# Patient Record
Sex: Female | Born: 1975 | Race: White | Hispanic: Yes | Marital: Single | Smoking: Never smoker
Health system: Southern US, Community
[De-identification: ages and names within clinical notes are randomized; demographics above are authoritative.]

## PROBLEM LIST (undated history)

## (undated) DIAGNOSIS — I639 Cerebral infarction, unspecified: Secondary | ICD-10-CM

## (undated) DIAGNOSIS — E119 Type 2 diabetes mellitus without complications: Secondary | ICD-10-CM

## (undated) DIAGNOSIS — I10 Essential (primary) hypertension: Secondary | ICD-10-CM

## (undated) HISTORY — DX: Cerebral infarction, unspecified: I63.9

---

## 2015-08-09 ENCOUNTER — Other Ambulatory Visit: Payer: Self-pay | Admitting: Podiatry

## 2015-08-09 ENCOUNTER — Ambulatory Visit (INDEPENDENT_AMBULATORY_CARE_PROVIDER_SITE_OTHER): Payer: Self-pay | Admitting: Podiatry

## 2015-08-09 ENCOUNTER — Ambulatory Visit: Payer: Self-pay

## 2015-08-09 ENCOUNTER — Ambulatory Visit (INDEPENDENT_AMBULATORY_CARE_PROVIDER_SITE_OTHER): Payer: Self-pay

## 2015-08-09 VITALS — BP 146/90 | HR 89 | Resp 16 | Ht 64.0 in | Wt 228.0 lb

## 2015-08-09 DIAGNOSIS — M722 Plantar fascial fibromatosis: Secondary | ICD-10-CM

## 2015-08-09 MED ORDER — DICLOFENAC SODIUM 75 MG PO TBEC: 75.0000 mg | DELAYED_RELEASE_TABLET | Freq: Two times a day (BID) | ORAL | Status: AC

## 2015-08-09 MED ORDER — TRIAMCINOLONE ACETONIDE 10 MG/ML IJ SUSP
10.0000 mg | Freq: Once | INTRAMUSCULAR | Status: AC
  Administered 2015-08-09: 10 mg

## 2015-08-09 NOTE — Progress Notes (Signed)
Subjective:     Patient ID: Aimee [name], female   DOB: 02/04/1976, 40 y.o.   MRN: [id number]  HPI patient states I'm having a lot of pain in my right heel for at least 6 months. Does not remember specific injury but states it's been very sore   Review of Systems     Objective:   Physical Exam Neurovascular status intact muscle strength adequate with exquisite discomfort plantar aspect right heel insertional point tendon calcaneus with moderate depression of the arch and no equinus condition noted. Patient is well oriented 3 with good digital perfusion    Assessment:     Acute plantar fasciitis right with inflammation    Plan:     H&P and x-rays reviewed with patient and today I injected the plantar fascia 3 mg Kenalog 5 mg Xylocaine and applied fascial brace with instructions on usage. Placed on diclofenac 75 mg twice a day and gave instructions on physical therapy and reappoint in 2 weeks

## 2015-08-09 NOTE — Patient Instructions (Signed)

## 2015-08-09 NOTE — Progress Notes (Signed)
   Subjective:    Patient ID: Aimee SaranMaria G [name], female    DOB: 02/11/1976, 40 y.o.   MRN: [id number]  HPI Patient presents with foot pain in their right foot, heel. Pstated, "hurts to turn foot and hurts more in the morning"; x6 months.   Review of Systems  HENT: Positive for sinus pressure.   Eyes: Positive for redness.  Musculoskeletal: Positive for gait problem.  Skin: Positive for color change.  All other systems reviewed and are negative.      Objective:   Physical Exam        Assessment & Plan:

## 2015-08-22 ENCOUNTER — Ambulatory Visit (INDEPENDENT_AMBULATORY_CARE_PROVIDER_SITE_OTHER): Payer: Self-pay | Admitting: Podiatry

## 2015-08-22 DIAGNOSIS — M722 Plantar fascial fibromatosis: Secondary | ICD-10-CM

## 2015-08-22 MED ORDER — TRIAMCINOLONE ACETONIDE 10 MG/ML IJ SUSP
10.0000 mg | Freq: Once | INTRAMUSCULAR | Status: AC
  Administered 2015-08-22: 10 mg

## 2015-08-24 NOTE — Progress Notes (Signed)
Subjective:     Patient ID: Aimee [name], female   DOB: 12-14-1975, 40 y.o.   MRN: [id number]  HPI patient states my heel is feeling quite a bit better but still sore at times.   Review of Systems     Objective:   Physical Exam Neurovascular status intact muscle strength adequate with continued discomfort plantar aspect right heel at the insertional point tendon into the calcaneus    Assessment:     Plantar fasciitis right with inflammation fluid buildup    Plan:     Reinjected plantar fascia 3 mg Kenalog 5 mg Xylocaine and instructed on the utilization of anti-inflammatories supportive shoe gear

## 2016-12-25 ENCOUNTER — Emergency Department (HOSPITAL_COMMUNITY): Payer: Medicaid Other

## 2016-12-25 ENCOUNTER — Encounter (HOSPITAL_COMMUNITY): Payer: Self-pay | Admitting: Emergency Medicine

## 2016-12-25 ENCOUNTER — Inpatient Hospital Stay (HOSPITAL_COMMUNITY): Payer: Medicaid Other

## 2016-12-25 ENCOUNTER — Inpatient Hospital Stay (HOSPITAL_COMMUNITY)
Admission: EM | Admit: 2016-12-25 | Discharge: 2017-01-07 | DRG: 020 | Disposition: A | Discharge status: Rehabilitation Facility | Payer: Medicaid Other | Attending: Neurosurgery | Admitting: Neurosurgery

## 2016-12-25 ENCOUNTER — Other Ambulatory Visit (HOSPITAL_COMMUNITY): Payer: Self-pay

## 2016-12-25 ENCOUNTER — Inpatient Hospital Stay (HOSPITAL_COMMUNITY): Payer: Medicaid Other | Admitting: Anesthesiology

## 2016-12-25 ENCOUNTER — Encounter (HOSPITAL_COMMUNITY)
Admission: EM | Disposition: A | Discharge status: Rehabilitation Facility | Payer: Self-pay | Source: Home / Self Care | Attending: Neurosurgery

## 2016-12-25 DIAGNOSIS — R402242 Coma scale, best verbal response, confused conversation, at arrival to emergency department: Secondary | ICD-10-CM | POA: Diagnosis present

## 2016-12-25 DIAGNOSIS — E876 Hypokalemia: Secondary | ICD-10-CM | POA: Diagnosis present

## 2016-12-25 DIAGNOSIS — J9690 Respiratory failure, unspecified, unspecified whether with hypoxia or hypercapnia: Secondary | ICD-10-CM

## 2016-12-25 DIAGNOSIS — E66811 Other obesity due to excess calories: Secondary | ICD-10-CM

## 2016-12-25 DIAGNOSIS — M542 Cervicalgia: Secondary | ICD-10-CM | POA: Diagnosis present

## 2016-12-25 DIAGNOSIS — Z9911 Dependence on respirator [ventilator] status: Secondary | ICD-10-CM | POA: Diagnosis not present

## 2016-12-25 DIAGNOSIS — E669 Obesity, unspecified: Secondary | ICD-10-CM | POA: Diagnosis present

## 2016-12-25 DIAGNOSIS — G8250 Quadriplegia, unspecified: Secondary | ICD-10-CM | POA: Diagnosis present

## 2016-12-25 DIAGNOSIS — R29718 NIHSS score 18: Secondary | ICD-10-CM | POA: Diagnosis present

## 2016-12-25 DIAGNOSIS — Z7984 Long term (current) use of oral hypoglycemic drugs: Secondary | ICD-10-CM | POA: Diagnosis not present

## 2016-12-25 DIAGNOSIS — T380X5A Adverse effect of glucocorticoids and synthetic analogues, initial encounter: Secondary | ICD-10-CM | POA: Diagnosis present

## 2016-12-25 DIAGNOSIS — R402113 Coma scale, eyes open, never, at hospital admission: Secondary | ICD-10-CM | POA: Diagnosis present

## 2016-12-25 DIAGNOSIS — R402132 Coma scale, eyes open, to sound, at arrival to emergency department: Secondary | ICD-10-CM | POA: Diagnosis present

## 2016-12-25 DIAGNOSIS — I6789 Other cerebrovascular disease: Secondary | ICD-10-CM | POA: Diagnosis not present

## 2016-12-25 DIAGNOSIS — T82528A Displacement of other cardiac and vascular devices and implants, initial encounter: Secondary | ICD-10-CM

## 2016-12-25 DIAGNOSIS — I6031 Nontraumatic subarachnoid hemorrhage from right posterior communicating artery: Principal | ICD-10-CM | POA: Diagnosis present

## 2016-12-25 DIAGNOSIS — R401 Stupor: Secondary | ICD-10-CM

## 2016-12-25 DIAGNOSIS — R402333 Coma scale, best motor response, abnormal, at hospital admission: Secondary | ICD-10-CM | POA: Diagnosis present

## 2016-12-25 DIAGNOSIS — R402342 Coma scale, best motor response, flexion withdrawal, at arrival to emergency department: Secondary | ICD-10-CM | POA: Diagnosis present

## 2016-12-25 DIAGNOSIS — Z2989 Encounter for other specified prophylactic measures: Secondary | ICD-10-CM

## 2016-12-25 DIAGNOSIS — R402213 Coma scale, best verbal response, none, at hospital admission: Secondary | ICD-10-CM | POA: Diagnosis present

## 2016-12-25 DIAGNOSIS — Z6833 Body mass index (BMI) 33.0-33.9, adult: Secondary | ICD-10-CM

## 2016-12-25 DIAGNOSIS — M6281 Muscle weakness (generalized): Secondary | ICD-10-CM | POA: Diagnosis present

## 2016-12-25 DIAGNOSIS — R0603 Acute respiratory distress: Secondary | ICD-10-CM | POA: Diagnosis not present

## 2016-12-25 DIAGNOSIS — D72829 Elevated white blood cell count, unspecified: Secondary | ICD-10-CM | POA: Diagnosis present

## 2016-12-25 DIAGNOSIS — I6340 Cerebral infarction due to embolism of unspecified cerebral artery: Secondary | ICD-10-CM | POA: Diagnosis present

## 2016-12-25 DIAGNOSIS — J9600 Acute respiratory failure, unspecified whether with hypoxia or hypercapnia: Secondary | ICD-10-CM | POA: Diagnosis not present

## 2016-12-25 DIAGNOSIS — G9340 Encephalopathy, unspecified: Secondary | ICD-10-CM

## 2016-12-25 DIAGNOSIS — Z9289 Personal history of other medical treatment: Secondary | ICD-10-CM

## 2016-12-25 DIAGNOSIS — R1310 Dysphagia, unspecified: Secondary | ICD-10-CM | POA: Diagnosis present

## 2016-12-25 DIAGNOSIS — I619 Nontraumatic intracerebral hemorrhage, unspecified: Secondary | ICD-10-CM | POA: Diagnosis present

## 2016-12-25 DIAGNOSIS — I671 Cerebral aneurysm, nonruptured: Secondary | ICD-10-CM | POA: Diagnosis present

## 2016-12-25 DIAGNOSIS — Z4659 Encounter for fitting and adjustment of other gastrointestinal appliance and device: Secondary | ICD-10-CM

## 2016-12-25 DIAGNOSIS — D649 Anemia, unspecified: Secondary | ICD-10-CM | POA: Diagnosis present

## 2016-12-25 DIAGNOSIS — R569 Unspecified convulsions: Secondary | ICD-10-CM | POA: Diagnosis present

## 2016-12-25 DIAGNOSIS — I10 Essential (primary) hypertension: Secondary | ICD-10-CM | POA: Diagnosis present

## 2016-12-25 DIAGNOSIS — J9601 Acute respiratory failure with hypoxia: Secondary | ICD-10-CM | POA: Diagnosis not present

## 2016-12-25 DIAGNOSIS — R000 Tachycardia, unspecified: Secondary | ICD-10-CM | POA: Diagnosis not present

## 2016-12-25 DIAGNOSIS — I609 Nontraumatic subarachnoid hemorrhage, unspecified: Secondary | ICD-10-CM

## 2016-12-25 DIAGNOSIS — R400 Somnolence: Secondary | ICD-10-CM | POA: Diagnosis not present

## 2016-12-25 DIAGNOSIS — I608 Other nontraumatic subarachnoid hemorrhage: Secondary | ICD-10-CM | POA: Diagnosis present

## 2016-12-25 DIAGNOSIS — R4182 Altered mental status, unspecified: Secondary | ICD-10-CM | POA: Diagnosis present

## 2016-12-25 DIAGNOSIS — E1165 Type 2 diabetes mellitus with hyperglycemia: Secondary | ICD-10-CM | POA: Diagnosis present

## 2016-12-25 DIAGNOSIS — R0602 Shortness of breath: Secondary | ICD-10-CM

## 2016-12-25 DIAGNOSIS — E1169 Type 2 diabetes mellitus with other specified complication: Secondary | ICD-10-CM

## 2016-12-25 DIAGNOSIS — Z298 Encounter for other specified prophylactic measures: Secondary | ICD-10-CM

## 2016-12-25 DIAGNOSIS — Z6830 Body mass index (BMI) 30.0-30.9, adult: Secondary | ICD-10-CM

## 2016-12-25 DIAGNOSIS — E6609 Other obesity due to excess calories: Secondary | ICD-10-CM

## 2016-12-25 HISTORY — PX: IR ANGIOGRAM FOLLOW UP STUDY: IMG697

## 2016-12-25 HISTORY — PX: IR ANGIO INTRA EXTRACRAN SEL INTERNAL CAROTID BILAT MOD SED: IMG5363

## 2016-12-25 HISTORY — DX: Type 2 diabetes mellitus without complications: E11.9

## 2016-12-25 HISTORY — PX: IR TRANSCATH/EMBOLIZ: IMG695

## 2016-12-25 HISTORY — DX: Essential (primary) hypertension: I10

## 2016-12-25 HISTORY — PX: RADIOLOGY WITH ANESTHESIA: SHX6223

## 2016-12-25 HISTORY — PX: IR ANGIO VERTEBRAL SEL VERTEBRAL UNI L MOD SED: IMG5367

## 2016-12-25 LAB — I-STAT ARTERIAL BLOOD GAS, ED
Acid-base deficit: 5 mmol/L — ABNORMAL HIGH (ref 0.0–2.0)
Bicarbonate: 20.1 mmol/L (ref 20.0–28.0)
O2 Saturation: 100 %
Patient temperature: 98.3
TCO2: 21 mmol/L (ref 0–100)
pCO2 arterial: 36.1 mmHg (ref 32.0–48.0)
pH, Arterial: 7.353 (ref 7.350–7.450)
pO2, Arterial: 501 mmHg — ABNORMAL HIGH (ref 83.0–108.0)

## 2016-12-25 LAB — CBC
HCT: 41 % (ref 36.0–46.0)
HCT: 39.9 % (ref 36.0–46.0)
Hemoglobin: 13.6 g/dL (ref 12.0–15.0)
Hemoglobin: 13.2 g/dL (ref 12.0–15.0)
MCH: 27.7 pg (ref 26.0–34.0)
MCH: 27.8 pg (ref 26.0–34.0)
MCHC: 33.2 g/dL (ref 30.0–36.0)
MCHC: 33.1 g/dL (ref 30.0–36.0)
MCV: 83.5 fL (ref 78.0–100.0)
MCV: 84 fL (ref 78.0–100.0)
Platelets: 339 10*3/uL (ref 150–400)
Platelets: 292 10*3/uL (ref 150–400)
RBC: 4.91 MIL/uL (ref 3.87–5.11)
RBC: 4.75 MIL/uL (ref 3.87–5.11)
RDW: 14.8 % (ref 11.5–15.5)
RDW: 15 % (ref 11.5–15.5)
WBC: 16.1 10*3/uL — ABNORMAL HIGH (ref 4.0–10.5)
WBC: 22.6 10*3/uL — ABNORMAL HIGH (ref 4.0–10.5)

## 2016-12-25 LAB — I-STAT CHEM 8, ED
BUN: 17 mg/dL (ref 6–20)
Calcium, Ion: 1.1 mmol/L — ABNORMAL LOW (ref 1.15–1.40)
Chloride: 105 mmol/L (ref 101–111)
Creatinine, Ser: 0.5 mg/dL (ref 0.44–1.00)
Glucose, Bld: 196 mg/dL — ABNORMAL HIGH (ref 65–99)
HCT: 43 % (ref 36.0–46.0)
Hemoglobin: 14.6 g/dL (ref 12.0–15.0)
Potassium: 2.9 mmol/L — ABNORMAL LOW (ref 3.5–5.1)
Sodium: 138 mmol/L (ref 135–145)
TCO2: 19 mmol/L (ref 0–100)

## 2016-12-25 LAB — COMPREHENSIVE METABOLIC PANEL WITH GFR
ALT: 15 U/L (ref 14–54)
AST: 20 U/L (ref 15–41)
Albumin: 3.8 g/dL (ref 3.5–5.0)
Alkaline Phosphatase: 70 U/L (ref 38–126)
Anion gap: 12 (ref 5–15)
BUN: 15 mg/dL (ref 6–20)
CO2: 18 mmol/L — ABNORMAL LOW (ref 22–32)
Calcium: 8.9 mg/dL (ref 8.9–10.3)
Chloride: 104 mmol/L (ref 101–111)
Creatinine, Ser: 0.7 mg/dL (ref 0.44–1.00)
GFR calc Af Amer: >60 mL/min (ref >60)
GFR calc non Af Amer: >60 mL/min (ref >60)
Glucose, Bld: 193 mg/dL — ABNORMAL HIGH (ref 65–99)
Potassium: 2.9 mmol/L — ABNORMAL LOW (ref 3.5–5.1)
Sodium: 134 mmol/L — ABNORMAL LOW (ref 135–145)
Total Bilirubin: 0.6 mg/dL (ref 0.3–1.2)
Total Protein: 7.2 g/dL (ref 6.5–8.1)

## 2016-12-25 LAB — BASIC METABOLIC PANEL WITH GFR
Anion gap: 10 (ref 5–15)
BUN: 11 mg/dL (ref 6–20)
CO2: 18 mmol/L — ABNORMAL LOW (ref 22–32)
Calcium: 8.3 mg/dL — ABNORMAL LOW (ref 8.9–10.3)
Chloride: 106 mmol/L (ref 101–111)
Creatinine, Ser: 0.66 mg/dL (ref 0.44–1.00)
GFR calc Af Amer: >60 mL/min (ref >60)
GFR calc non Af Amer: >60 mL/min (ref >60)
Glucose, Bld: 189 mg/dL — ABNORMAL HIGH (ref 65–99)
Potassium: 4.2 mmol/L (ref 3.5–5.1)
Sodium: 134 mmol/L — ABNORMAL LOW (ref 135–145)

## 2016-12-25 LAB — GLUCOSE, CAPILLARY
Glucose-Capillary: 194 mg/dL — ABNORMAL HIGH (ref 65–99)
Glucose-Capillary: 178 mg/dL — ABNORMAL HIGH (ref 65–99)
Glucose-Capillary: 185 mg/dL — ABNORMAL HIGH (ref 65–99)
Glucose-Capillary: 178 mg/dL — ABNORMAL HIGH (ref 65–99)
Glucose-Capillary: 159 mg/dL — ABNORMAL HIGH (ref 65–99)

## 2016-12-25 LAB — PROTIME-INR
INR: 1.04
INR: 1.08
Prothrombin Time: 13.7 s (ref 11.4–15.2)
Prothrombin Time: 14 s (ref 11.4–15.2)

## 2016-12-25 LAB — RAPID URINE DRUG SCREEN, HOSP PERFORMED
Amphetamines: NOT DETECTED
Barbiturates: NOT DETECTED
Benzodiazepines: POSITIVE — AB
Cocaine: NOT DETECTED
Opiates: NOT DETECTED
Tetrahydrocannabinol: NOT DETECTED

## 2016-12-25 LAB — DIFFERENTIAL
Basophils Absolute: 0 10*3/uL (ref 0.0–0.1)
Basophils Relative: 0 %
Eosinophils Absolute: 0.3 10*3/uL (ref 0.0–0.7)
Eosinophils Relative: 2 %
Lymphocytes Relative: 25 %
Lymphs Abs: 4 10*3/uL (ref 0.7–4.0)
Monocytes Absolute: 0.8 10*3/uL (ref 0.1–1.0)
Monocytes Relative: 5 %
Neutro Abs: 11 10*3/uL — ABNORMAL HIGH (ref 1.7–7.7)
Neutrophils Relative %: 68 %

## 2016-12-25 LAB — CBG MONITORING, ED: Glucose-Capillary: 203 mg/dL — ABNORMAL HIGH (ref 65–99)

## 2016-12-25 LAB — MRSA PCR SCREENING: MRSA by PCR: NEGATIVE

## 2016-12-25 LAB — APTT
aPTT: 30 s (ref 24–36)
aPTT: 32 s (ref 24–36)

## 2016-12-25 LAB — ABO/RH: ABO/RH(D): O POS

## 2016-12-25 LAB — HIV ANTIBODY (ROUTINE TESTING W REFLEX): HIV Screen 4th Generation wRfx: NONREACTIVE

## 2016-12-25 LAB — ECHOCARDIOGRAM COMPLETE
Height: 64 in
Weight: 3350.99 [oz_av]

## 2016-12-25 LAB — TRIGLYCERIDES: Triglycerides: 322 mg/dL — ABNORMAL HIGH (ref <150)

## 2016-12-25 LAB — I-STAT TROPONIN, ED: Troponin i, poc: 0 ng/mL (ref 0.00–0.08)

## 2016-12-25 LAB — PREPARE RBC (CROSSMATCH)

## 2016-12-25 SURGERY — RADIOLOGY WITH ANESTHESIA
Anesthesia: Monitor Anesthesia Care

## 2016-12-25 MED ORDER — FENTANYL BOLUS VIA INFUSION
50.0000 ug | INTRAVENOUS | Status: DC | PRN
Start: 2016-12-25 — End: 2017-01-02
  Administered 2016-12-28: 50 ug via INTRAVENOUS
  Filled 2016-12-25: qty 50

## 2016-12-25 MED ORDER — ONDANSETRON HCL 4 MG/2ML IJ SOLN
4.0000 mg | Freq: Four times a day (QID) | INTRAMUSCULAR | Status: DC | PRN
Start: 2016-12-25 — End: 2017-01-07
  Administered 2017-01-01 (×2): 4 mg via INTRAVENOUS
  Filled 2016-12-25 (×2): qty 2

## 2016-12-25 MED ORDER — NIMODIPINE 30 MG PO CAPS: 60.0000 mg | ORAL_CAPSULE | ORAL | Status: DC

## 2016-12-25 MED ORDER — PROPOFOL 1000 MG/100ML IV EMUL
0.0000 ug/kg/min | INTRAVENOUS | Status: DC
  Administered 2016-12-26 – 2016-12-27 (×4): 30 ug/kg/min via INTRAVENOUS
  Administered 2016-12-27: 20 ug/kg/min via INTRAVENOUS
  Administered 2016-12-28 (×4): 30 ug/kg/min via INTRAVENOUS
  Administered 2016-12-29 – 2016-12-30 (×7): 50 ug/kg/min via INTRAVENOUS
  Administered 2016-12-30: 40 ug/kg/min via INTRAVENOUS
  Administered 2016-12-30: 25 ug/kg/min via INTRAVENOUS
  Administered 2016-12-31: 30 ug/kg/min via INTRAVENOUS
  Administered 2016-12-31: 40 ug/kg/min via INTRAVENOUS
  Filled 2016-12-25 (×28): qty 100

## 2016-12-25 MED ORDER — MIDAZOLAM HCL 5 MG/5ML IJ SOLN
INTRAMUSCULAR | Status: DC | PRN
  Administered 2016-12-25 (×2): 2 mg via INTRAVENOUS

## 2016-12-25 MED ORDER — ACETAMINOPHEN 160 MG/5ML PO SOLN
650.0000 mg | ORAL | Status: DC | PRN
  Administered 2016-12-27 – 2017-01-01 (×7): 650 mg
  Filled 2016-12-25 (×7): qty 20.3

## 2016-12-25 MED ORDER — PERFLUTREN LIPID MICROSPHERE
INTRAVENOUS | Status: AC
  Administered 2016-12-25: 2 mL
  Filled 2016-12-25: qty 10

## 2016-12-25 MED ORDER — SODIUM CHLORIDE 0.9 % IV SOLN: 250.0000 mL | INTRAVENOUS | Status: DC | PRN

## 2016-12-25 MED ORDER — ROCURONIUM BROMIDE 100 MG/10ML IV SOLN
INTRAVENOUS | Status: DC | PRN
  Administered 2016-12-25 (×2): 50 mg via INTRAVENOUS

## 2016-12-25 MED ORDER — SODIUM CHLORIDE 0.9 % IV SOLN: Freq: Once | INTRAVENOUS | Status: DC

## 2016-12-25 MED ORDER — CHLORHEXIDINE GLUCONATE 0.12% ORAL RINSE (MEDLINE KIT)
15.0000 mL | Freq: Two times a day (BID) | OROMUCOSAL | Status: DC
  Administered 2016-12-25 – 2017-01-07 (×25): 15 mL via OROMUCOSAL

## 2016-12-25 MED ORDER — FENTANYL 2500MCG IN NS 250ML (10MCG/ML) PREMIX INFUSION
25.0000 ug/h | INTRAVENOUS | Status: DC
  Administered 2016-12-25 – 2016-12-26 (×2): 50 ug/h via INTRAVENOUS
  Administered 2016-12-26: 75 ug/h via INTRAVENOUS
  Administered 2016-12-27: 125 ug/h via INTRAVENOUS
  Administered 2016-12-27 – 2016-12-28 (×2): 100 ug/h via INTRAVENOUS
  Administered 2016-12-29: 350 ug/h via INTRAVENOUS
  Administered 2016-12-29: 400 ug/h via INTRAVENOUS
  Administered 2016-12-30: 300 ug/h via INTRAVENOUS
  Administered 2016-12-30: 400 ug/h via INTRAVENOUS
  Administered 2016-12-30: 300 ug/h via INTRAVENOUS
  Filled 2016-12-25 (×12): qty 250

## 2016-12-25 MED ORDER — ONDANSETRON 4 MG PO TBDP: 4.0000 mg | ORAL_TABLET | Freq: Four times a day (QID) | ORAL | Status: DC | PRN

## 2016-12-25 MED ORDER — INSULIN ASPART 100 UNIT/ML ~~LOC~~ SOLN
0.0000 [IU] | Freq: Three times a day (TID) | SUBCUTANEOUS | Status: DC
  Administered 2016-12-25 – 2016-12-26 (×5): 4 [IU] via SUBCUTANEOUS
  Administered 2016-12-26: 7 [IU] via SUBCUTANEOUS
  Administered 2016-12-27 (×3): 4 [IU] via SUBCUTANEOUS

## 2016-12-25 MED ORDER — PROPOFOL 1000 MG/100ML IV EMUL
INTRAVENOUS | Status: AC
  Administered 2016-12-25: 10 ug/kg/min via INTRAVENOUS
  Filled 2016-12-25: qty 100

## 2016-12-25 MED ORDER — FENTANYL CITRATE (PF) 250 MCG/5ML IJ SOLN
INTRAMUSCULAR | Status: DC | PRN
  Administered 2016-12-25 (×2): 50 ug via INTRAVENOUS

## 2016-12-25 MED ORDER — PROMETHAZINE HCL 25 MG/ML IJ SOLN
12.5000 mg | Freq: Four times a day (QID) | INTRAMUSCULAR | Status: DC | PRN
  Administered 2016-12-25: 12.5 mg via INTRAVENOUS
  Filled 2016-12-25: qty 1

## 2016-12-25 MED ORDER — POTASSIUM CHLORIDE 20 MEQ/15ML (10%) PO SOLN
40.0000 meq | ORAL | Status: AC
  Administered 2016-12-25: 40 meq
  Filled 2016-12-25: qty 30

## 2016-12-25 MED ORDER — ACETAMINOPHEN 650 MG RE SUPP
650.0000 mg | RECTAL | Status: DC | PRN
  Administered 2016-12-27: 650 mg via RECTAL
  Filled 2016-12-25: qty 1

## 2016-12-25 MED ORDER — PROPOFOL 1000 MG/100ML IV EMUL
5.0000 ug/kg/min | Freq: Once | INTRAVENOUS | Status: DC
  Administered 2016-12-25: 10 ug/kg/min via INTRAVENOUS

## 2016-12-25 MED ORDER — DEXAMETHASONE SODIUM PHOSPHATE 10 MG/ML IJ SOLN
10.0000 mg | Freq: Four times a day (QID) | INTRAMUSCULAR | Status: DC
  Administered 2016-12-25 – 2017-01-07 (×55): 10 mg via INTRAVENOUS
  Filled 2016-12-25 (×55): qty 1

## 2016-12-25 MED ORDER — TRAMADOL HCL 50 MG PO TABS
50.0000 mg | ORAL_TABLET | Freq: Four times a day (QID) | ORAL | Status: DC | PRN
  Administered 2017-01-01 – 2017-01-06 (×4): 100 mg via ORAL
  Filled 2016-12-25 (×4): qty 2

## 2016-12-25 MED ORDER — MIDAZOLAM HCL 2 MG/2ML IJ SOLN
INTRAMUSCULAR | Status: AC
  Filled 2016-12-25: qty 2

## 2016-12-25 MED ORDER — ASPIRIN 81 MG PO CHEW: 324.0000 mg | CHEWABLE_TABLET | ORAL | Status: AC

## 2016-12-25 MED ORDER — ETOMIDATE 2 MG/ML IV SOLN
INTRAVENOUS | Status: DC | PRN
  Administered 2016-12-25: 20 mg via INTRAVENOUS

## 2016-12-25 MED ORDER — PERFLUTREN LIPID MICROSPHERE
1.0000 mL | INTRAVENOUS | Status: AC | PRN
  Filled 2016-12-25: qty 10

## 2016-12-25 MED ORDER — IOPAMIDOL (ISOVUE-300) INJECTION 61%
INTRAVENOUS | Status: AC
  Filled 2016-12-25: qty 150

## 2016-12-25 MED ORDER — PANTOPRAZOLE SODIUM 40 MG PO TBEC: 40.0000 mg | DELAYED_RELEASE_TABLET | Freq: Every day | ORAL | Status: DC

## 2016-12-25 MED ORDER — IOPAMIDOL (ISOVUE-370) INJECTION 76%
INTRAVENOUS | Status: AC
  Administered 2016-12-25: 50 mL
  Filled 2016-12-25: qty 50

## 2016-12-25 MED ORDER — NIMODIPINE 60 MG/20ML PO SOLN
60.0000 mg | ORAL | Status: DC
  Administered 2016-12-25 – 2017-01-07 (×74): 60 mg
  Filled 2016-12-25 (×80): qty 20

## 2016-12-25 MED ORDER — NIMODIPINE 60 MG/20ML PO SOLN: 60.0000 mg | ORAL | Status: DC

## 2016-12-25 MED ORDER — STROKE: EARLY STAGES OF RECOVERY BOOK
Freq: Once | Status: AC
  Administered 2016-12-25: 15:00:00
  Filled 2016-12-25: qty 1

## 2016-12-25 MED ORDER — SODIUM CHLORIDE 0.9 % IV SOLN
INTRAVENOUS | Status: DC | PRN
  Administered 2016-12-25: 20:00:00 via INTRAVENOUS

## 2016-12-25 MED ORDER — SODIUM CHLORIDE 0.9 % IV SOLN
INTRAVENOUS | Status: DC
  Administered 2016-12-25 – 2017-01-02 (×8): via INTRAVENOUS
  Administered 2017-01-03: 75 mL/h via INTRAVENOUS
  Administered 2017-01-03 – 2017-01-06 (×5): via INTRAVENOUS

## 2016-12-25 MED ORDER — SODIUM CHLORIDE 0.9 % IV SOLN
1000.0000 mg | Freq: Once | INTRAVENOUS | Status: AC
  Administered 2016-12-25: 1000 mg via INTRAVENOUS
  Filled 2016-12-25: qty 10

## 2016-12-25 MED ORDER — NICARDIPINE HCL IN NACL 20-0.86 MG/200ML-% IV SOLN: 3.0000 mg/h | Freq: Once | INTRAVENOUS | Status: DC

## 2016-12-25 MED ORDER — PANTOPRAZOLE SODIUM 40 MG IV SOLR
40.0000 mg | INTRAVENOUS | Status: DC
  Administered 2016-12-25 – 2017-01-06 (×13): 40 mg via INTRAVENOUS
  Filled 2016-12-25 (×12): qty 40

## 2016-12-25 MED ORDER — FENTANYL CITRATE (PF) 100 MCG/2ML IJ SOLN
50.0000 ug | Freq: Once | INTRAMUSCULAR | Status: AC
  Administered 2016-12-25: 50 ug via INTRAVENOUS

## 2016-12-25 MED ORDER — POTASSIUM CHLORIDE CRYS ER 20 MEQ PO TBCR: 40.0000 meq | EXTENDED_RELEASE_TABLET | ORAL | Status: DC

## 2016-12-25 MED ORDER — PANTOPRAZOLE SODIUM 40 MG PO PACK: 40.0000 mg | PACK | Freq: Every day | ORAL | Status: DC

## 2016-12-25 MED ORDER — NIMODIPINE 30 MG PO CAPS
60.0000 mg | ORAL_CAPSULE | ORAL | Status: DC
  Administered 2017-01-05 – 2017-01-07 (×4): 60 mg via ORAL
  Filled 2016-12-25 (×18): qty 2

## 2016-12-25 MED ORDER — ACETAMINOPHEN 325 MG PO TABS: 650.0000 mg | ORAL_TABLET | ORAL | Status: DC | PRN

## 2016-12-25 MED ORDER — SUCCINYLCHOLINE CHLORIDE 20 MG/ML IJ SOLN
INTRAMUSCULAR | Status: DC | PRN
  Administered 2016-12-25: 150 mg via INTRAVENOUS

## 2016-12-25 MED ORDER — PANTOPRAZOLE SODIUM 40 MG IV SOLR: 40.0000 mg | Freq: Every day | INTRAVENOUS | Status: DC

## 2016-12-25 MED ORDER — ASPIRIN 300 MG RE SUPP: 300.0000 mg | RECTAL | Status: AC

## 2016-12-25 MED ORDER — INSULIN ASPART 100 UNIT/ML ~~LOC~~ SOLN: 2.0000 [IU] | SUBCUTANEOUS | Status: DC

## 2016-12-25 MED ORDER — LEVETIRACETAM 500 MG/5ML IV SOLN
500.0000 mg | Freq: Two times a day (BID) | INTRAVENOUS | Status: DC
  Administered 2016-12-25 – 2017-01-07 (×27): 500 mg via INTRAVENOUS
  Filled 2016-12-25 (×29): qty 5

## 2016-12-25 MED ORDER — DOCUSATE SODIUM 100 MG PO CAPS: 100.0000 mg | ORAL_CAPSULE | Freq: Two times a day (BID) | ORAL | Status: DC

## 2016-12-25 MED ORDER — NICARDIPINE HCL IN NACL 20-0.86 MG/200ML-% IV SOLN
0.0000 mg/h | INTRAVENOUS | Status: DC
  Administered 2016-12-25: 5 mg/h via INTRAVENOUS
  Administered 2016-12-26: 12 mg/h via INTRAVENOUS
  Administered 2016-12-26 (×2): 15 mg/h via INTRAVENOUS
  Administered 2016-12-26: 8 mg/h via INTRAVENOUS
  Administered 2016-12-26: 15 mg/h via INTRAVENOUS
  Filled 2016-12-25 (×6): qty 200

## 2016-12-25 MED ORDER — DOCUSATE SODIUM 50 MG/5ML PO LIQD
100.0000 mg | Freq: Two times a day (BID) | ORAL | Status: DC
  Administered 2016-12-25 – 2016-12-31 (×8): 100 mg
  Filled 2016-12-25 (×10): qty 10

## 2016-12-25 MED ORDER — ORAL CARE MOUTH RINSE
15.0000 mL | OROMUCOSAL | Status: DC
  Administered 2016-12-25 – 2016-12-31 (×54): 15 mL via OROMUCOSAL

## 2016-12-25 NOTE — Anesthesia Preprocedure Evaluation (Addendum)
Anesthesia Evaluation  Patient identified by MRN, date of birth, ID band Patient awake    Reviewed: Allergy & Precautions, NPO status , Patient's Chart, lab work & pertinent test resultsPreop documentation limited or incomplete due to emergent nature of procedure.  Airway Mallampati: Intubated       Dental   Pulmonary neg pulmonary ROS,    breath sounds clear to auscultation       Cardiovascular hypertension,  Rhythm:Regular Rate:Tachycardia     Neuro/Psych SAH    GI/Hepatic negative GI ROS, Neg liver ROS,   Endo/Other  diabetes, Type 2, Oral Hypoglycemic Agents  Renal/GU negative Renal ROS     Musculoskeletal   Abdominal   Peds  Hematology negative hematology ROS (+)   Anesthesia Other Findings   Reproductive/Obstetrics                          Lab Results  Component Value Date   WBC 22.6 (H) 12/25/2016   HGB 13.2 12/25/2016   HCT 39.9 12/25/2016   MCV 84.0 12/25/2016   PLT 292 12/25/2016   Lab Results  Component Value Date   CREATININE 0.50 12/25/2016   BUN 17 12/25/2016   NA 138 12/25/2016   K 2.9 (L) 12/25/2016   CL 105 12/25/2016   CO2 18 (L) 12/25/2016    Anesthesia Physical Anesthesia Plan  ASA: IV and emergent  Anesthesia Plan: General   Post-op Pain Management:    Induction: Intravenous  Airway Management Planned: Oral ETT  Additional Equipment: Arterial line and CVP  Intra-op Plan:   Post-operative Plan: Post-operative intubation/ventilation  Informed Consent: I have reviewed the patients History and Physical, chart, labs and discussed the procedure including the risks, benefits and alternatives for the proposed anesthesia with the patient or authorized representative who has indicated his/her understanding and acceptance.   Only emergency history available and History available from chart only  Plan Discussed with: CRNA, Anesthesiologist and  Surgeon  Anesthesia Plan Comments:      Anesthesia Quick Evaluation

## 2016-12-25 NOTE — H&P (Signed)
Subjective: The patient is a 41 year old obese Hispanic female immigrant from [place] with a history of diabetes and hypertension. According to her brother who lives with the patient, the patient was found to be vomiting and unresponsive early this morning. He describes decerebrate posturing. EMS was called and the patient was brought to [facility] emerged department. By report the patient was posturing and not moving her extremities. The patient was sedated and intubated. A head CT was obtained which demonstrated a diffuse subarachnoid hemorrhage. This was followed by CT angiogram which demonstrates a right posterior communicating artery segment aneurysm. A neurosurgical consultation was requested.  The patient's history is obtained by the patient's brother, [name], who was at the bedside. She is not married and has no children.  Past Medical History:  Diagnosis Date  . Diabetes mellitus without complication ([place])   . Hypertension     History reviewed. No pertinent surgical history.  No Known Allergies  Social History  Substance Use Topics  . Smoking status: Not on file  . Smokeless tobacco: Not on file  . Alcohol use No    No family history on file. Prior to Admission medications   Medication Sig Start Date End Date Taking? Authorizing Provider  metFORMIN (GLUCOPHAGE) 500 MG tablet Take 500 mg by mouth daily with breakfast.   Yes [provider]     Review of Systems  Positive ROS: Unobtainable    Objective: Vital signs in last 24 hours: Temp:  [98.3 F (36.8 C)] 98.3 F (36.8 C) (05/22 0648) Pulse Rate:  [65-127] 95 (05/22 0657) Resp:  [14-29] 18 (05/22 0657) BP: (109-212)/(86-113) 139/95 (05/22 0657) SpO2:  [99 %-100 %] 100 % (05/22 0657) FiO2 (%):  [100 %] 100 % (05/22 0555) Weight:  [81.6 kg (180 lb)] 81.6 kg (180 lb) (05/22 0558)  Physical exam:  General: An obese intubated 41 year old Hispanic female  HEENT: Normocephalic, atraumatic. Her pupils are  approximately 2 mm bilaterally.  Neck: Unremarkable  Thorax: Symmetric  Abdomen: Obese  Extremities: Unremarkable  Neurologic exam: Glasgow Coma Scale 5 intubated, E1M3V1. She will weakly flex her left upper extremity and extends her right upper extremity to painful stimuli. Cranial nerve exam is limited. She has a cough and gag reflex. She has corneal reflexes bilaterally. I can't get her to move her lower extremities.  I have reviewed the patient's head CT performed at [facility] today. It demonstrates a diffuse subarachnoid hemorrhage. She doesn't have any significant hydrocephalus. She has quite a hyperostotic skull.  By report the patient's CT angiogram demonstrates a left posterior communicating artery segment aneurysm.   Data Review Lab Results  Component Value Date   WBC 16.1 (H) 12/25/2016   HGB 14.6 12/25/2016   HCT 43.0 12/25/2016   MCV 83.5 12/25/2016   PLT 339 12/25/2016   Lab Results  Component Value Date   NA 138 12/25/2016   K 2.9 (L) 12/25/2016   CL 105 12/25/2016   CO2 18 (L) 12/25/2016   BUN 17 12/25/2016   CREATININE 0.50 12/25/2016   GLUCOSE 196 (H) 12/25/2016   Lab Results  Component Value Date   INR 1.04 12/25/2016    Assessment/Plan: Grade 5 subarachnoid hemorrhage, right posterior communicating artery segment aneurysm: I have discussed the situation with the patient's brother. We have discussed the various treatment options. I have recommended coiling of the aneurysm to hopefully prevent future rupture. I briefly explained the procedure to the patient's brother. He wants to proceed with procedure. I have spoken  to Dr. Kathyrn Sheriff will plan to do this later today. In the meantime the patient is being admitted to the ICU. I have spoken with Dr. Elsworth Soho, critical care medicine.   Tarrell Debes D 12/25/2016 7:13 AM

## 2016-12-25 NOTE — ED Notes (Signed)
Pt starting to posture more. MD notified

## 2016-12-25 NOTE — Care Management Note (Signed)
Case Management Note  Patient Details  Name: Aimee [name] MRN: [id number] Date of Birth: April 05, 1976  Subjective/Objective:  Pt admitted on 12/2216 with Grade 5 SAH with Rt PCOM artery segment aneurym.  PTA, pt independent; lives with brother.                   Action/[name]: Pt currently intubated; for angiogram and likely coiling of RT PCOM aneurysm.  Will continue to follow as pt progresses.    Expected Discharge Date:                  Expected Discharge [name]:     In-House Referral:     Discharge planning Services  CM Consult  Post Acute Care Choice:    Choice offered to:     DME Arranged:    DME Agency:     HH Arranged:    HH Agency:     Status of Service:  In process, will continue to follow  If discussed at Long Length of Stay Meetings, dates discussed:    Additional Comments:  Reinaldo Raddle, RN, BSN  Trauma/Neuro ICU Case Manager [phone]

## 2016-12-25 NOTE — Anesthesia Procedure Notes (Signed)
Central Venous Catheter Insertion Performed by: Roberts Gaudy, anesthesiologist Start/End5/22/2018 8:25 PM, 12/25/2016 8:30 PM Patient location: OR. Preanesthetic checklist: patient identified, IV checked, site marked, risks and benefits discussed, surgical consent, monitors and equipment checked, pre-op evaluation and timeout performed Position: supine Catheter size: 8 Fr Central line was placed.Double lumen Procedure performed using ultrasound guided technique. Ultrasound Notes:anatomy identified, needle tip was noted to be adjacent to the nerve/plexus identified and no ultrasound evidence of intravascular and/or intraneural injection Attempts: 2 Following insertion, line sutured, dressing applied and Biopatch. Post procedure assessment: blood return through all ports, free fluid flow and no air  Patient tolerated the procedure well with no immediate complications.

## 2016-12-25 NOTE — Progress Notes (Signed)
Prior to angiogram, pts sedation was discontinued. Per RN, pt was arousable, opening eyes, nodding appropriately to questions. Was unable to produce any voluntary movement of the extremities.  Angiogram does not demonstrate any significant vasospasm to explain dense quadriparesis/quadriplegia. Will order MRI Brain/Cspine without gad to further workup.

## 2016-12-25 NOTE — Progress Notes (Signed)
SLP Cancellation Note  Patient Details Name: Aimee [name] MRN: [id number] DOB: 07-Jun-1976   Cancelled treatment:       Reason Eval/Treat Not Completed: Patient not medically ready   Peri Kreft, Katherene Ponto 12/25/2016, 8:35 AM

## 2016-12-25 NOTE — Consult Note (Addendum)
NEURO HOSPITALIST CONSULT NOTE   Requestig physician: Dr. Dina Rich  Reason for Consult: "not moving any extremity"  History obtained from:  Family and Chart     HPI:                                                                                                                                          Aimee [name] is an 41 y.o. female who presented from home with tetraplegia and AMS. LKN was 0200. Family was awakened by sound of patient vomiting in her bed. They immediately checked on her and noted that she had vomited on herself, was altered and exhibited diffuse stiffening of all 4 of her limbs. When family demonstrates what the stiffness looked like, it appears that it most likely was decerebrate posturing. There was no jerking of the extremities. Eyes were open and patient was awake when found in this state. Patient also was diaphoretic. She was not responding to EMS. Two episodes of emesis were noted and she exhibited decorticate posturing on the left while vomiting. She was confused in triage and not fullowing commands, but would answer some questions.   CT head in the ED demonstrated diffuse subarachnoid hemorrhage, filling CSF spaces in the suprasellar and basal cisterns as well as bilateral sulci. Overall appearance highly suspicious for ruptured intracranial Aneurysm.  CTA of head reveals a 7 x 6 x 5 mm right posterior communicating artery aneurysm. Aneurysm is directed posteriorly, and demonstrates a fairly narrow neck. Also noted was diffuse irregularity throughout the remaining intracranial arterial vasculature, suspicious for vasospasm secondary to acute subarachnoid hemorrhage.   Past Medical History:  Diagnosis Date  . Diabetes mellitus without complication ([place])   . Hypertension     History reviewed. No pertinent surgical history.  No family history on file.  Social History:  reports that she does not drink alcohol. Her tobacco and drug histories are  not on file.  No Known Allergies  MEDICATIONS:                                                                                                                     Metformin  ROS:  Unable to obtain ROS due to AMS.    BP 121/88   Pulse 90   Temp 98.3 F (36.8 C) (Temporal)   Resp 17   Ht 5\' 4"  (1.626 m)   Wt 81.6 kg (180 lb)   SpO2 100%   BMI 30.90 kg/m   SpO2 99 %.  General Examination:                                                                                                      HEENT-  [place]/AT Lungs- Respirations unlabored Extremities- Warm and well-perfused  Neurological Examination Mental Status: Awake. Poor attention. Rotates head from side to side repetitively at times. Minimal verbal output to questions. Not following most commands.  Cranial Nerves:  II: Attends to peripheral visual stimuli on right and left. PERRL.   III,IV, VI: ptosis not present, EOMI with saccadic pursuits noted. No nystagmus.  V,VII: smile symmetric, does not grimace to noxious stimulus applied to earlobes bilaterally VIII: answers some questions IX,X: palate rises symmetrically XI: no gross asymmetry XII: midline tongue extension Motor: Increased extensor tone bilateral upper and lower extremities. No movement spontaneously or to noxious stimuli, except for bilateral triple flexion reflexes.  Sensory: Insensate to noxious stimuli bilateral upper and lower extremities.  Deep Tendon Reflexes: 2+ bilateral upper extremities. 4+ patellae bilaterally. 1+ achilles. Upgoing toes with weak triple flexion reflex bilaterally.  Cerebellar: Unable to assess.  Gait: Unable to assess.   Lab Results: Basic Metabolic Panel:  Recent Labs Lab 12/25/16 0427  NA 138  K 2.9*  CL 105  GLUCOSE 196*  BUN 17  CREATININE 0.50    Liver Function Tests: No results for  input(s): AST, ALT, ALKPHOS, BILITOT, PROT, ALBUMIN in the last 168 hours. No results for input(s): LIPASE, AMYLASE in the last 168 hours. No results for input(s): AMMONIA in the last 168 hours.  CBC:  Recent Labs Lab 12/25/16 0403 12/25/16 0427  WBC 16.1*  --   NEUTROABS 11.0*  --   HGB 13.6 14.6  HCT 41.0 43.0  MCV 83.5  --   PLT 339  --     Cardiac Enzymes: No results for input(s): CKTOTAL, CKMB, CKMBINDEX, TROPONINI in the last 168 hours.  Lipid Panel: No results for input(s): CHOL, TRIG, HDL, CHOLHDL, VLDL, LDLCALC in the last 168 hours.  CBG:  Recent Labs Lab 12/25/16 0418  GLUCAP 203*    Microbiology: No results found for this or any previous visit.  Coagulation Studies:  Recent Labs  12/25/16 0403  LABPROT 13.7  INR 1.04    Imaging: No results found.  Assessment: 1. Extensive infratentorial and supratentorial subarachnoid hemorrhage. 4th ventricular blood accumulation also noted. Most likely secondary to right PCOM aneurysm rupture based upon CTA result.  2. Also noted on CTA is diffuse irregularity throughout the remaining intracranial arterial vasculature, suspicious for vasospaLeukocytosis. Most likely reactive secondary to subarachnoid hemorrhage.  3. Tetraplegia with loss of sensation is suggestive of brainstem stroke or bilateral frontal and parietal lobe strokes secondary to vasospasm. A rostral cervical spinal cord lesion is also possible.  4. Possible seizure. Her posturing at home may also have been secondary to acute brainstem ischemia.   Recommendations: 1. Neuroendovascular consultation is recommended for aneurysm coiling.  2. Will also need Neurosurgery consultation if clipping is better option than coiling. Also need further guidance for control of vasospasm as she may need high-dose intraarterial verapamil for acute treatment of cerebral vasospasm after her aneurysmal subarachnoid hemorrhage. While awaiting Neurosurgery recommendations,  drop NGT and start nimodipine 60 mg q4h for an anticipated total duration of 21 days.  3. MRI brain and cervical spinal cord.  4. BP management.  5. No antiplatelet medications or anticoagulants. DVT prophylaxis with SCDs.  6. Will need ICU admission.  7. Monitoring of cerebral arterial blood flow with TCD. 8. Keppra 1000 mg IV x 1, followed by maintenance dosing of 500 mg IV BID.   Electronically signed: Dr. Kerney Elbe 12/25/2016, 4:46 AM

## 2016-12-25 NOTE — ED Provider Notes (Signed)
[facility] DEPT Provider Note   CSN: [id number] Arrival date & time: 12/25/16  0345     History   Chief Complaint Chief Complaint  Patient presents with  . Altered Mental Status    HPI Aimee [name] is a 41 y.o. female.  HPI  This a 41 year old female with a history of diabetes and hypertension who presents with altered mental status, vomiting. Most of the history was obtained from a family member. Last seen normal at 2 AM. One of the patient's family members got up and hurt her vomiting. She was noted to be sweating and vomiting. She then became unresponsive. Per EMS she was "not responsive. She had some posturing. She had 2 episodes of emesis and was given Zofran. No history of seizures. Patient initially confused in triage. She will answer some question. She does appear to be oriented. She reports head and neck pain. She denies any preceding headache to the vomiting. She states she is unable to move any of her extremities.  Caveat for acuity of condition  Past Medical History:  Diagnosis Date  . Diabetes mellitus without complication ([place])   . Hypertension     Patient Active Problem List   Diagnosis Date Noted  . SAH (subarachnoid hemorrhage) ([place]) 12/25/2016    History reviewed. No pertinent surgical history.  OB History    No data available       Home Medications    Prior to Admission medications   Medication Sig Start Date End Date Taking? Authorizing Provider  metFORMIN (GLUCOPHAGE) 500 MG tablet Take 500 mg by mouth daily with breakfast.   Yes [provider]    Family History No family history on file.  Social History Social History  Substance Use Topics  . Smoking status: Not on file  . Smokeless tobacco: Not on file  . Alcohol use No     Allergies   Patient has no known allergies.   Review of Systems Review of Systems  Constitutional: Negative for fever.  Respiratory: Negative for chest tightness.   Cardiovascular:  Negative for chest pain.  Musculoskeletal: Positive for neck pain.  Neurological: Positive for seizures, weakness and headaches. Negative for syncope.  All other systems reviewed and are negative.    Physical Exam Updated Vital Signs BP (!) 134/98   Pulse 87   Temp 98.3 F (36.8 C) (Temporal)   Resp 17   Ht 5\' 4"  (1.626 m)   Wt 81.6 kg (180 lb)   SpO2 100%   BMI 30.90 kg/m   Physical Exam  Constitutional: She is oriented to person, place, and time. She appears well-developed and well-nourished.  HENT:  Head: Normocephalic and atraumatic.  Eyes: EOM are normal. Pupils are equal, round, and reactive to light.  Neck: Normal range of motion. Neck supple.  Cardiovascular: Normal rate, regular rhythm and normal heart sounds.   No murmur heard. Pulmonary/Chest: Effort normal and breath sounds normal. No respiratory distress. She has no wheezes.  Abdominal: Soft. Bowel sounds are normal. There is no tenderness.  Musculoskeletal: She exhibits edema.  Neurological: She is alert and oriented to person, place, and time.  At times appears confused, cranial nerves II through XII intact, 1 out of 5 strength with grip strength bilaterally, some tremulousness in the right grip, no proximal muscle strength, unable to wiggle toes, diminished reflexes with patellar reflexes, no clonus, decreased sensation bilateral lower extremities  Skin: Skin is warm and dry.  Psychiatric: She has a normal mood and affect.  Nursing  note and vitals reviewed.    ED Treatments / Results  Labs (all labs ordered are listed, but only abnormal results are displayed) Labs Reviewed  CBC - Abnormal; Notable for the following:       Result Value   WBC 16.1 (*)    All other components within normal limits  DIFFERENTIAL - Abnormal; Notable for the following:    Neutro Abs 11.0 (*)    All other components within normal limits  COMPREHENSIVE METABOLIC PANEL - Abnormal; Notable for the following:    Sodium 134 (*)      Potassium 2.9 (*)    CO2 18 (*)    Glucose, Bld 193 (*)    All other components within normal limits  CBG MONITORING, ED - Abnormal; Notable for the following:    Glucose-Capillary 203 (*)    All other components within normal limits  I-STAT CHEM 8, ED - Abnormal; Notable for the following:    Potassium 2.9 (*)    Glucose, Bld 196 (*)    Calcium, Ion 1.10 (*)    All other components within normal limits  I-STAT ARTERIAL BLOOD GAS, ED - Abnormal; Notable for the following:    pO2, Arterial 501.0 (*)    Acid-base deficit 5.0 (*)    All other components within normal limits  PROTIME-INR  APTT  HIV ANTIBODY (ROUTINE TESTING)  TRIGLYCERIDES  BLOOD GAS, ARTERIAL  BASIC METABOLIC PANEL  I-STAT TROPOININ, ED    EKG  EKG Interpretation  Date/Time:  Tuesday Dec 25 2016 04:03:28 EDT Ventricular Rate:  88 PR Interval:    QRS Duration: 99 QT Interval:  398 QTC Calculation: 482 R Axis:   62 Text Interpretation:  Sinus rhythm Borderline T abnormalities, inferior leads Confirmed by Thayer Jew [phone]) on 12/25/2016 7:28:16 AM       Radiology Ct Angio Head W Or Wo Contrast  Result Date: 12/25/2016 CLINICAL DATA:  Initial evaluation for acute subarachnoid hemorrhage. EXAM: CT ANGIOGRAPHY HEAD AND NECK TECHNIQUE: Multidetector CT imaging of the head and neck was performed using the standard protocol during bolus administration of intravenous contrast. Multiplanar CT image reconstructions and MIPs were obtained to evaluate the vascular anatomy. Carotid stenosis measurements (when applicable) are obtained utilizing NASCET criteria, using the distal internal carotid diameter as the denominator. CONTRAST:  50 cc of Isovue 370. COMPARISON:  Prior head CT from earlier same day. FINDINGS: CTA NECK FINDINGS Aortic arch: Visualized aortic arch of normal caliber with normal branch pattern. No flow-limiting stenosis about the origin of the great vessels. Visualized subclavian arteries widely  patent. Right carotid system: Right common and internal carotid arteries are widely patent to the skullbase without flow-limiting stenosis or other acute vascular abnormality. No significant atheromatous plaque about the right bifurcation. Left carotid system: Left common and internal carotid arteries widely patent without flow-limiting stenosis or other acute vascular abnormality. No significant atheromatous plaque about the left bifurcation. Vertebral arteries: Left vertebral artery arises separately from the aortic arch. Right vertebral artery rises from the right subclavian artery. Right vertebral artery dominant. Vertebral arteries widely patent within the neck without stenosis or other acute vascular abnormality. Skeleton: No acute osseus abnormality. No worrisome lytic or blastic osseous lesions. Other neck: Mildly prominent right level 1 B nodes measure up to 8 mm. No definite acute inflammatory changes within this region. No acute soft tissue abnormality within the neck. Thyroid normal. Upper chest: Visualized upper chest within normal limits. Partially visualized lungs are clear. Review of the MIP images  confirms the above findings CTA HEAD FINDINGS Anterior circulation: Petrous segments patent bilaterally. Cavernous and supraclinoid ICAs mildly diminutive but patent to the termini without flow-limiting stenosis. There is an ovoid aneurysm positioned at the takeoff of the right posterior communicating artery that measures 7 x 6 x 5 mm (series 7, image 1 await). Aneurysm projects posteriorly, and demonstrates a fairly narrow neck. A1 segments irregular and diminutive but patent without high-grade stenosis. Anterior communicating artery within normal limits. Anterior cerebral arteries irregular but patent to their distal aspects. M1 segments demonstrate multifocal irregularity but are patent without high-grade stenosis. MCA bifurcations within normal limits. Distal MCA branches symmetric but demonstrate  multifocal irregularity. This diffuse vascular regularity suspected be related to underlying vasospasm due to subarachnoid hemorrhage. Posterior circulation: Right vertebral artery dominant. Vertebral arteries are somewhat diminutive and irregular bilaterally, likely related to vasospasm. Vertebral arteries are patent to the vertebrobasilar junction. Right PICA patent proximally. Left PICA not well visualized. Basilar artery diminutive and irregular but patent to its distal aspect. Superior cerebral arteries patent bilaterally. Both of the posterior cerebral artery supplied primarily via the basilar artery and are patent to their distal aspects. Venous sinuses: Not well evaluated on this exam. Anatomic variants: No significant anatomic variant. No other vascular malformation or aneurysm identified. Delayed phase: No pathologic enhancement. Diffuse subarachnoid hemorrhage again noted. Intraventricular hemorrhage with blood in the fourth ventricle noted as well, likely related to redistribution. No hydrocephalus at this time. Review of the MIP images confirms the above findings IMPRESSION: CTA NECK IMPRESSION: Normal CTA of the neck. CTA HEAD IMPRESSION: 1. 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 Aneurysm is directed posteriorly, and demonstrates a fairly narrow neck. Neuro endovascular consultation is recommended. 2. Diffuse irregularity throughout the remaining intracranial arterial vasculature, suspicious for vasospasm secondary to acute subarachnoid hemorrhage. Critical Value/emergent results were called by telephone at the time of interpretation on 12/25/2016 at 5:45 am to Dr. Thayer Jew , who verbally acknowledged these results. Electronically Signed   By: Jeannine Boga M.D.   On: 12/25/2016 06:20   Ct Head Wo Contrast  Result Date: 12/25/2016 CLINICAL DATA:  Neurological deficits, last seen normal at 0 200 hours. Patient is diaphoretic and vomiting. Nonresponsive to family.  History of hypertension and diabetes. EXAM: CT HEAD WITHOUT CONTRAST TECHNIQUE: Contiguous axial images were obtained from the base of the skull through the vertex without intravenous contrast. COMPARISON:  None. FINDINGS: Brain: Diffuse subarachnoid hemorrhage filling the basal cisterns, bilateral sulci, and suprasellar cisterns. Appearance would be suspicious for ruptured intracranial aneurysm. No focal lesions identified. Gray-white matter junctions are distinct. No ventricular dilatation. Vascular: No vascular calcifications identified. Vessels are not well demonstrated due to large amount of subarachnoid hemorrhage. Skull: Calvarium appears intact. Sinuses/Orbits: Diffuse opacification of the left maxillary antrum. Mastoid air cells are not opacified. Other: None. IMPRESSION: Diffuse subarachnoid hemorrhage, filling CSF spaces in the suprasellar and basal cisterns as well as bilateral sulci. Cause is not determined but presents high suspicion for ruptured intracranial aneurysm. These results were called by telephone at the time of interpretation on 12/25/2016 at 5:05 am to Dr. Thayer Jew , who verbally acknowledged these results. Electronically Signed   By: Lucienne Capers M.D.   On: 12/25/2016 05:10   Ct Angio Neck W And/or Wo Contrast  Result Date: 12/25/2016 CLINICAL DATA:  Initial evaluation for acute subarachnoid hemorrhage. EXAM: CT ANGIOGRAPHY HEAD AND NECK TECHNIQUE: Multidetector CT imaging of the head and neck was performed using the  standard protocol during bolus administration of intravenous contrast. Multiplanar CT image reconstructions and MIPs were obtained to evaluate the vascular anatomy. Carotid stenosis measurements (when applicable) are obtained utilizing NASCET criteria, using the distal internal carotid diameter as the denominator. CONTRAST:  50 cc of Isovue 370. COMPARISON:  Prior head CT from earlier same day. FINDINGS: CTA NECK FINDINGS Aortic arch: Visualized aortic arch of  normal caliber with normal branch pattern. No flow-limiting stenosis about the origin of the great vessels. Visualized subclavian arteries widely patent. Right carotid system: Right common and internal carotid arteries are widely patent to the skullbase without flow-limiting stenosis or other acute vascular abnormality. No significant atheromatous plaque about the right bifurcation. Left carotid system: Left common and internal carotid arteries widely patent without flow-limiting stenosis or other acute vascular abnormality. No significant atheromatous plaque about the left bifurcation. Vertebral arteries: Left vertebral artery arises separately from the aortic arch. Right vertebral artery rises from the right subclavian artery. Right vertebral artery dominant. Vertebral arteries widely patent within the neck without stenosis or other acute vascular abnormality. Skeleton: No acute osseus abnormality. No worrisome lytic or blastic osseous lesions. Other neck: Mildly prominent right level 1 B nodes measure up to 8 mm. No definite acute inflammatory changes within this region. No acute soft tissue abnormality within the neck. Thyroid normal. Upper chest: Visualized upper chest within normal limits. Partially visualized lungs are clear. Review of the MIP images confirms the above findings CTA HEAD FINDINGS Anterior circulation: Petrous segments patent bilaterally. Cavernous and supraclinoid ICAs mildly diminutive but patent to the termini without flow-limiting stenosis. There is an ovoid aneurysm positioned at the takeoff of the right posterior communicating artery that measures 7 x 6 x 5 mm (series 7, image 1 await). Aneurysm projects posteriorly, and demonstrates a fairly narrow neck. A1 segments irregular and diminutive but patent without high-grade stenosis. Anterior communicating artery within normal limits. Anterior cerebral arteries irregular but patent to their distal aspects. M1 segments demonstrate multifocal  irregularity but are patent without high-grade stenosis. MCA bifurcations within normal limits. Distal MCA branches symmetric but demonstrate multifocal irregularity. This diffuse vascular regularity suspected be related to underlying vasospasm due to subarachnoid hemorrhage. Posterior circulation: Right vertebral artery dominant. Vertebral arteries are somewhat diminutive and irregular bilaterally, likely related to vasospasm. Vertebral arteries are patent to the vertebrobasilar junction. Right PICA patent proximally. Left PICA not well visualized. Basilar artery diminutive and irregular but patent to its distal aspect. Superior cerebral arteries patent bilaterally. Both of the posterior cerebral artery supplied primarily via the basilar artery and are patent to their distal aspects. Venous sinuses: Not well evaluated on this exam. Anatomic variants: No significant anatomic variant. No other vascular malformation or aneurysm identified. Delayed phase: No pathologic enhancement. Diffuse subarachnoid hemorrhage again noted. Intraventricular hemorrhage with blood in the fourth ventricle noted as well, likely related to redistribution. No hydrocephalus at this time. Review of the MIP images confirms the above findings IMPRESSION: CTA NECK IMPRESSION: Normal CTA of the neck. CTA HEAD IMPRESSION: 1. 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 Aneurysm is directed posteriorly, and demonstrates a fairly narrow neck. Neuro endovascular consultation is recommended. 2. Diffuse irregularity throughout the remaining intracranial arterial vasculature, suspicious for vasospasm secondary to acute subarachnoid hemorrhage. Critical Value/emergent results were called by telephone at the time of interpretation on 12/25/2016 at 5:45 am to Dr. Thayer Jew , who verbally acknowledged these results. Electronically Signed   By: Jeannine Boga M.D.   On:  12/25/2016 06:20   Dg Chest Port 1 View  Result Date:  12/25/2016 CLINICAL DATA:  Altered mental status. Seizures and vomiting. Intubation and OG tube placement EXAM: PORTABLE CHEST 1 VIEW COMPARISON:  None. FINDINGS: An endotracheal tube has been placed with tip measuring 3 cm above the carina. Enteric tube is coiled in the upper abdomen. The tube appears to pass into the distal stomach and then the tip returns to the upper stomach region. Shallow inspiration. No airspace disease or consolidation in the lungs. No blunting of costophrenic angles. No pneumothorax. Normal heart size and pulmonary vascularity. IMPRESSION: Appliances are in satisfactory position. The enteric tube is somewhat coiled in the stomach and could consider withdrawing the tube a few cm to straighten it out. Shallow inspiration. No active pulmonary disease. Electronically Signed   By: Lucienne Capers M.D.   On: 12/25/2016 06:17    Procedures Procedures (including critical care time)  CRITICAL CARE Performed by: Merryl Hacker   Total critical care time: 65 minutes  Critical care time was exclusive of separately billable procedures and treating other patients.  Critical care was necessary to treat or prevent imminent or life-threatening deterioration.  Critical care was time spent personally by me on the following activities: development of treatment [name] with patient and/or surrogate as well as nursing, discussions with consultants, evaluation of patient's response to treatment, examination of patient, obtaining history from patient or surrogate, ordering and performing treatments and interventions, ordering and review of laboratory studies, ordering and review of radiographic studies, pulse oximetry and re-evaluation of patient's condition.  INTUBATION Performed by: Merryl Hacker  Required items: required blood products, implants, devices, and special equipment available Patient identity confirmed: provided demographic data and hospital-assigned identification  number Time out: Immediately prior to procedure a "time out" was called to verify the correct patient, procedure, equipment, support staff and site/side marked as required.  Indications: airway protection  Intubation method: Glidescope Laryngoscopy   Preoxygenation: BVM  Sedatives: Etomidate Paralytic: Succinylcholine  Tube Size: 7.5 cuffed  Post-procedure assessment: chest rise and ETCO2 monitor Breath sounds: equal and absent over the epigastrium Tube secured with: ETT holder Chest x-ray interpreted by radiologist and me.  Chest x-ray findings: endotracheal tube in appropriate position  Patient tolerated the procedure well with no immediate complications.    Medications Ordered in ED Medications  promethazine (PHENERGAN) injection 12.5 mg (12.5 mg Intravenous Given 12/25/16 0433)  nicardipine (CARDENE) 20mg  in 0.86% saline 251ml IV infusion (0.1 mg/ml) (0 mg/hr Intravenous Hold 12/25/16 0600)  etomidate (AMIDATE) injection (20 mg Intravenous Given 12/25/16 0549)  midazolam (VERSED) 5 MG/5ML injection ( Intravenous Canceled Entry 12/25/16 0630)  0.9 %  sodium chloride infusion (not administered)  pantoprazole (PROTONIX) injection 40 mg (not administered)  fentaNYL 2578mcg in NS 25mL (65mcg/ml) infusion-PREMIX (100 mcg/hr Intravenous Rate/Dose Change 12/25/16 0716)  fentaNYL (SUBLIMAZE) bolus via infusion 50 mcg (not administered)  propofol (DIPRIVAN) 1000 MG/100ML infusion (not administered)  potassium chloride 20 MEQ/15ML (10%) solution 40 mEq (not administered)  insulin aspart (novoLOG) injection 2-6 Units (not administered)  iopamidol (ISOVUE-370) 76 % injection (50 mLs  Contrast Given 12/25/16 0517)  levETIRAcetam (KEPPRA) 1,000 mg in sodium chloride 0.9 % 100 mL IVPB (0 mg Intravenous Stopped 12/25/16 0613)  fentaNYL (SUBLIMAZE) injection 50 mcg (50 mcg Intravenous Bolus 12/25/16 0647)     Initial Impression / Assessment and [name] / ED Course  I have reviewed the triage  vital signs and the nursing notes.  Pertinent labs & imaging results  that were available during my care of the patient were reviewed by me and considered in my medical decision making (see chart for details).  Clinical Course as of Dec 25 725  Tue Dec 25, 2016  0440 After initial evaluation, neurology was urgently consulted given patient's weakness in all 4 extremities. It appears she may have had a stroke and this could be Todd's paralysis. Given that it is bilateral and does not fit the distribution for stroke, code stroke was not initiated. I discussed this with Dr. Cheral Marker.  He will evaluate the patient urgently and provide further recommendations.  [CH]    Clinical Course User Index [CH] Bexley Mclester, Barbette Hair, MD    Patient presents with altered mental status, vomiting. Most of the history is provided by family. Patient is intermittently confused. She is not moving any of her extremities and appears to have some posturing. She does report headache now. History is concerning for seizure. I would also be concerned for head bleed given the posturing and vomiting with headache. Workup emergently started. Patient sent to CT scan. Neurology to evaluate.  To call from radiology. Patient has diffuse subarachnoid hemorrhage concerning for aneurysm. CT angiogram ordered. This was obtained and concerning for a PCOM aneurysm.  Discussed with neurosurgery, Dr. Arnoldo Morale. He will evaluate the patient. Patient was intermittently confused and vomiting. She was intubated for airway control. Postintubation was started on propofol for sedation and blood pressure management. Nicardipine was not needed.  Critical care was consulted for vent management.  Final Clinical Impressions(s) / ED Diagnoses   Final diagnoses:  SAH (subarachnoid hemorrhage) ([place])  Seizure ([place])  Altered mental status    New Prescriptions New Prescriptions   No medications on file     Merryl Hacker, MD 12/25/16 0730

## 2016-12-25 NOTE — ED Notes (Signed)
CCM at bedside 

## 2016-12-25 NOTE — Progress Notes (Signed)
PREOP DX: Subarachnoid Hemorrhage  POSTOP DX: Same  PROCEDURE:  1. Diagnostic cerebral angiogram 2. Coil embolization right posterior communicating artery aneurysm  SURGEON: Rafaella Kole  ASSISTANT: None  ANESTHESIA: GETA  EBL: Minimal  SPECIMENS: None  DRAINS: None  COMPLICATIONS: None immediate  CONDITION: Hemodynamically stable to ICU  FINDINGS:  1. 7.5 x 6.64mm right Pcom aneurysm projecting posteriorly, coiled successfully without aneurysm residual. 2. No significant vasospasm seen 3. No other intracranial aneurysms, AVM, or fistulas seen

## 2016-12-25 NOTE — Progress Notes (Signed)
Patient just arrived from IR. RT placed patient on ventilator. Patient tolerating well at this time. RT will monitor.

## 2016-12-25 NOTE — ED Notes (Addendum)
OG tube withdrawn 5cm; bile colored emesis noted

## 2016-12-25 NOTE — Progress Notes (Signed)
RT did not do a vent check at this time due to patient being taken to IR. CRNA used ambu bag to transport patient. Ventilator is in room.

## 2016-12-25 NOTE — ED Notes (Signed)
Neuro surgery at bedside.

## 2016-12-25 NOTE — H&P (Signed)
PULMONARY / CRITICAL CARE MEDICINE   Name: Aimee [name] MRN: [id number] DOB: 25-Jul-1976    ADMISSION DATE:  12/25/2016 CONSULTATION DATE:  12/25/2016  REFERRING MD:  Dr. Dina Rich, EDP  CHIEF COMPLAINT:  AMS  HISTORY OF PRESENT ILLNESS: Information obtained from medical record and patient family as patient is intubated and sedated.  41 year old with PMH of DM and HTN. Presents to ED on 5/22 with new onset altered mental status. Family member reports that they heard her get up from bed and vomit, she was noted to also be diaphoretic, last seen normal at 2 AM. When EMS arrived patient was not responsive and was noted to be posturing. Upon arrival to ED patient waxing and waning with confusion, states she is unable to move her extremities. 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 Intubated for airway protection. PCCM asked to admit.   PAST MEDICAL HISTORY :  She  has a past medical history of Diabetes mellitus without complication ([place]) and Hypertension.  PAST SURGICAL HISTORY: She  has no past surgical history on file.  No Known Allergies  No current facility-administered medications on file prior to encounter.    No current outpatient prescriptions on file prior to encounter.    FAMILY HISTORY:  Her has no family status information on file.    SOCIAL HISTORY: She  reports that she does not drink alcohol.  REVIEW OF SYSTEMS:   Unable to review as patient is intubated and sedated   SUBJECTIVE:  On mechanical ventilation and propofol gtt.   VITAL SIGNS: BP (!) 146/95   Pulse 67   Resp 17   Wt 81.6 kg (180 lb)   SpO2 100%   HEMODYNAMICS:    VENTILATOR SETTINGS:    INTAKE / OUTPUT: No intake/output data recorded.  PHYSICAL EXAMINATION: General:  Adult female, no distress  Neuro:  Sedated, posturing noted throughout, pupils intact  HEENT:  ETT in place  Cardiovascular:  Tachy, no MRG Lungs:  Clear breath  sounds, non-labored  Abdomen:  Obese, active bowel sounds  Musculoskeletal:  -edema  Skin:  Warm, dry, intact   LABS:  BMET  Recent Labs Lab 12/25/16 0403 12/25/16 0427  NA 134* 138  K 2.9* 2.9*  CL 104 105  CO2 18*  --   BUN 15 17  CREATININE 0.70 0.50  GLUCOSE 193* 196*    Electrolytes  Recent Labs Lab 12/25/16 0403  CALCIUM 8.9    CBC  Recent Labs Lab 12/25/16 0403 12/25/16 0427  WBC 16.1*  --   HGB 13.6 14.6  HCT 41.0 43.0  PLT 339  --     Coag's  Recent Labs Lab 12/25/16 0403  APTT 30  INR 1.04    Sepsis Markers No results for input(s): LATICACIDVEN, PROCALCITON, O2SATVEN in the last 168 hours.  ABG No results for input(s): PHART, PCO2ART, PO2ART in the last 168 hours.  Liver Enzymes  Recent Labs Lab 12/25/16 0403  AST 20  ALT 15  ALKPHOS 70  BILITOT 0.6  ALBUMIN 3.8    Cardiac Enzymes No results for input(s): TROPONINI, PROBNP in the last 168 hours.  Glucose  Recent Labs Lab 12/25/16 0418  GLUCAP 203*    Imaging Ct Head Wo Contrast  Result Date: 12/25/2016 CLINICAL DATA:  Neurological deficits, last seen normal at 0 200 hours. Patient is diaphoretic and vomiting. Nonresponsive to family. History of hypertension and diabetes. EXAM: CT HEAD WITHOUT CONTRAST TECHNIQUE: Contiguous axial images were  obtained from the base of the skull through the vertex without intravenous contrast. COMPARISON:  None. FINDINGS: Brain: Diffuse subarachnoid hemorrhage filling the basal cisterns, bilateral sulci, and suprasellar cisterns. Appearance would be suspicious for ruptured intracranial aneurysm. No focal lesions identified. Gray-white matter junctions are distinct. No ventricular dilatation. Vascular: No vascular calcifications identified. Vessels are not well demonstrated due to large amount of subarachnoid hemorrhage. Skull: Calvarium appears intact. Sinuses/Orbits: Diffuse opacification of the left maxillary antrum. Mastoid air cells are not  opacified. Other: None. IMPRESSION: Diffuse subarachnoid hemorrhage, filling CSF spaces in the suprasellar and basal cisterns as well as bilateral sulci. Cause is not determined but presents high suspicion for ruptured intracranial aneurysm. These results were called by telephone at the time of interpretation on 12/25/2016 at 5:05 am to Dr. Thayer Jew , who verbally acknowledged these results. Electronically Signed   By: Lucienne Capers M.D.   On: 12/25/2016 05:10     STUDIES:  CT Head 5/22 > Diffuse subarachnoid hemorrhage, filling CSF spaces in the suprasellar and basal cisterns as well as bilateral sulci. 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 Aneurysm is directed posteriorly, and demonstrates a fairly narrow neck. Neuro endovascular consultation is recommended. Diffuse irregularity throughout the remaining intracranial arterial vasculature, suspicious for vasospasm secondary to acute subarachnoid hemorrhage  CULTURES: None.   ANTIBIOTICS: None.   SIGNIFICANT EVENTS: 5/22 > Presents to ED with new AMS   LINES/TUBES: ETT 5/22 >>  DISCUSSION: 41 year old female presents with N/V and Confusion. CTA head revealed 7 x 6 x 5 mm right posterior communicating artery aneurysm  ASSESSMENT / [name]:  PULMONARY A: Respiratory Insufficiency in setting of aneurysm and SAH P:   Vent Support No Wean > waiting for neurosurgery consult  Trend ABG and CXR   CARDIOVASCULAR A:  HTN P:  Cardiac Monitoring  BP Goals per Neurology   RENAL A:   Hypokalemia  P:   Trend BMP Replace electrolytes as needed   GASTROINTESTINAL A:   Dysphagia  P:   NPO PPI ST Evaluation after extubation  Will began TF later today if no [name] for OR/IR intervention   HEMATOLOGIC A:   SAH P:  Trend CBC  Hold anticoagulation   INFECTIOUS A:   Leukocytosis  P:   Trend  Fever and WBC curve   ENDOCRINE A:   DM    P:   Hold home Metformin  SSI Trend Glucose   NEUROLOGIC A:   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

## 2016-12-25 NOTE — Progress Notes (Signed)
  Echocardiogram 2D Echocardiogram with Definity has been performed.  Tresa Res 12/25/2016, 4:36 PM

## 2016-12-25 NOTE — Progress Notes (Signed)
History reviewed. Pt presented initially with confusion and tetraplegia. Unclear why she was intubated but was awake and talking upon arrival.  No known FH of aneurysms, unknown smoking hx.  EXAM:  BP (!) 134/98   Pulse 87   Temp 98.3 F (36.8 C) (Axillary)   Resp 17   Ht 5\' 4"  (1.626 m)   Wt 95 kg (209 lb 7 oz)   SpO2 100%   BMI 35.95 kg/m   Currently intubated, on propofol: Opens eyes to voice Breathing over vent Pupils 51mm, reactive No motor responses to pain in extremities  CT demonstrates diffuse basal SAH with some IVH. CTA shows ~31mm right Pcom aneurysm projecting posteriorly.  IMPRESSION:  41 y.o. female Hunt-Hess 5, Fisher 3/4. Unclear why she is tetraparetic - ?brainstem infarction  PLAN: - Will proceed with angiogram, likely coiling of right Pcom aneurysm.  No family is currently available to discuss the risks/benefits/alternatives of the procedure above. We will therefore plan on proceeding under emergent circumstances with implied consent.

## 2016-12-25 NOTE — ED Triage Notes (Signed)
Per EMS: Called out for AMS. Last seen normal 0200. Pt diaphoretic, vomiting, and not responding to family. Pt not responsive to EMS. Hard to communicate. Reflexes intact. Pt has decorticate posturing on left side while vomiting. 2 episodes of emesis. 4 mg of Zofran given.  Pt confused in triage. Not following commands. Will answer some questions.

## 2016-12-25 NOTE — Progress Notes (Signed)
STROKE TEAM PROGRESS NOTE   HISTORY OF PRESENT ILLNESS (per record) Aimee [name] is an 41 y.o. female who presented from home with tetraplegia and AMS. LKN was 0200. Family was awakened by sound of patient vomiting in her bed. They immediately checked on her and noted that she had vomited on herself, was altered and exhibited diffuse stiffening of all 4 of her limbs. When family demonstrates what the stiffness looked like, it appears that it most likely was decerebrate posturing. There was no jerking of the extremities. Eyes were open and patient was awake when found in this state. Patient also was diaphoretic. She was not responding to EMS. Two episodes of emesis were noted and she exhibited decorticate posturing on the left while vomiting. She was confused in triage and not fullowing commands, but would answer some questions.   CT head in the ED demonstrated diffuse subarachnoid hemorrhage, filling CSF spaces in the suprasellar and basal cisterns as well as bilateral sulci. Overall appearance highly suspicious for ruptured intracranial aneurysm.  CTA of head reveals a 7 x 6 x 5 mm right posterior communicating artery aneurysm. Aneurysm is directed posteriorly, and demonstrates a fairly narrow neck. Also noted was diffuse irregularity throughout the remaining intracranial arterial vasculature, suspicious for vasospasm secondary to acute subarachnoid hemorrhage.   SUBJECTIVE (INTERVAL HISTORY) No family is at the bedside.  Pt is intubated and sedated. Not following commands or open eyes. Vitals stable. NSG planning for emergent coiling.    OBJECTIVE Temp:  [98.3 F (36.8 C)] 98.3 F (36.8 C) (05/22 0648) Pulse Rate:  [65-127] 87 (05/22 0715) Cardiac Rhythm: Sinus tachycardia (05/22 0656) Resp:  [14-29] 17 (05/22 0715) BP: (109-212)/(86-113) 134/98 (05/22 0715) SpO2:  [99 %-100 %] 100 % (05/22 0715) FiO2 (%):  [100 %] 100 % (05/22 0555) Weight:  [81.6 kg (180 lb)] 81.6 kg (180 lb) (05/22  0558)  CBC:  Recent Labs Lab 12/25/16 0403 12/25/16 0427  WBC 16.1*  --   NEUTROABS 11.0*  --   HGB 13.6 14.6  HCT 41.0 43.0  MCV 83.5  --   PLT 339  --     Basic Metabolic Panel:  Recent Labs Lab 12/25/16 0403 12/25/16 0427  NA 134* 138  K 2.9* 2.9*  CL 104 105  CO2 18*  --   GLUCOSE 193* 196*  BUN 15 17  CREATININE 0.70 0.50  CALCIUM 8.9  --     Lipid Panel: No results found for: CHOL, TRIG, HDL, CHOLHDL, VLDL, LDLCALC HgbA1c: No results found for: HGBA1C Urine Drug Screen: No results found for: LABOPIA, COCAINSCRNUR, LABBENZ, AMPHETMU, THCU, LABBARB  Alcohol Level No results found for: [place] I have personally reviewed the radiological images below and agree with the radiology interpretations.  Ct Angio Head W Or Wo Contrast Ct Angio Neck W And/or Wo Contrast 12/25/2016  CTA NECK IMPRESSION:  Normal CTA of the neck.   CTA HEAD IMPRESSION:  1. 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 Aneurysm is directed posteriorly, and demonstrates a fairly narrow neck. Neuro endovascular consultation is recommended.  2. Diffuse irregularity throughout the remaining intracranial arterial vasculature, suspicious for vasospasm secondary to acute subarachnoid hemorrhage.   Ct Head Wo Contrast 12/25/2016 Diffuse subarachnoid hemorrhage, filling CSF spaces in the suprasellar and basal cisterns as well as bilateral sulci. Cause is not determined but presents high suspicion for ruptured intracranial aneurysm.   TTE pending  TCD pending   PHYSICAL EXAM  Temp:  [98.3  F (36.8 C)-99.2 F (37.3 C)] 99.2 F (37.3 C) (05/22 1600) Pulse Rate:  [65-127] 71 (05/22 1300) Resp:  [14-29] 21 (05/22 1300) BP: (109-212)/(75-113) 125/75 (05/22 1300) SpO2:  [99 %-100 %] 100 % (05/22 1605) FiO2 (%):  [40 %-100 %] 40 % (05/22 1605) Weight:  [180 lb (81.6 kg)-209 lb 7 oz (95 kg)] 209 lb 7 oz (95 kg) (05/22 0825)  General - Well nourished, well developed,  intubated on sedation.  Ophthalmologic - Fundi not visualized due to noncooperation.  Cardiovascular - Regular rate and rhythm.  Neuro - intubated on sedation, not open eyes, not following commands. On forced eye opening, no doll's eyes, pupillary reflex sluggish, no corneal, but positive gag. Eyes disconjugate, no nystagmus. On pain stimulation, no significant movement on all extremities. DTR diminished and no babinski. Sensation, gait and coordination not tested.    ASSESSMENT/[name] Aimee [name] is a 41 y.o. female with history of hypertension and diabetes mellitus presenting with tetraplegia and altered mental status. She did not receive IV t-PA due to subarachnoid hemorrhage.  SAH due to 7 x 6 x 5 mm right posterior communicating artery aneurysm rupture   CT head - Diffuse basal subarachnoid hemorrhage.  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  CTA neck - normal  TCD pending  2D Echo - pending  VTE prophylaxis - SCDs  Diet NPO time specified  No antithrombotic prior to admission, now on No antithrombotic secondary to [place].  NSG planned coiling today  On keppra for seizure prophylaxis  Ongoing aggressive stroke risk factor management  Therapy recommendations: pending  Disposition: Pending  ? Vasospasm  CTA head not convinced for vasospasm - too early for vasospasm  TCD pending  NSG planned coiling today and will able to look at vessel with angiogram  On nimodipine 60mg  Q4h  BP stable.  Hypertension  Stable - IV Cardene  BP goal < 160  Long-term BP goal normotensive  Other Stroke Risk Factors  Obesity, Body mass index is 30.9 kg/m., recommend weight loss, diet and exercise as appropriate   Other Active Problems  Hypokalemia - 2.9 - supplement  Leukocytosis - 16.1  Hospital day # 0  This patient is critically ill due to diffuse basal SAH, ruptured PCOM aneurysm, HTN, ? Cerebral vasospasm and at  significant risk of neurological worsening, death form recurrent SAH, vasospasm, hydrocephalus, seizure. This patient's care requires constant monitoring of vital signs, hemodynamics, respiratory and cardiac monitoring, review of multiple databases, neurological assessment, discussion with family, other specialists and medical decision making of high complexity. I spent 40 minutes of neurocritical care time in the care of this patient.  Rosalin Hawking, MD PhD Stroke Neurology 12/25/2016 5:02 PM    To contact Stroke Continuity provider, please refer to [email or web address] After hours, contact General Neurology

## 2016-12-25 NOTE — ED Notes (Signed)
Pt to CT

## 2016-12-25 NOTE — Progress Notes (Signed)
PT Cancellation Note  Patient Details Name: Aimee [name] MRN: [id number] DOB: 04-26-1976   Cancelled Treatment:    Reason Eval/Treat Not Completed: Patient not medically ready Pt in OR with neurosurgery. Will follow up.   [name] 12/25/2016, 2:07 PM Wray Kearns, [place], DPT [phone]

## 2016-12-26 ENCOUNTER — Inpatient Hospital Stay (HOSPITAL_COMMUNITY): Payer: Medicaid Other

## 2016-12-26 ENCOUNTER — Encounter (HOSPITAL_COMMUNITY): Payer: Self-pay | Admitting: Neurosurgery

## 2016-12-26 DIAGNOSIS — R401 Stupor: Secondary | ICD-10-CM

## 2016-12-26 DIAGNOSIS — R569 Unspecified convulsions: Secondary | ICD-10-CM

## 2016-12-26 DIAGNOSIS — G8250 Quadriplegia, unspecified: Secondary | ICD-10-CM

## 2016-12-26 DIAGNOSIS — Z9911 Dependence on respirator [ventilator] status: Secondary | ICD-10-CM

## 2016-12-26 LAB — GLUCOSE, CAPILLARY
Glucose-Capillary: 195 mg/dL — ABNORMAL HIGH (ref 65–99)
Glucose-Capillary: 199 mg/dL — ABNORMAL HIGH (ref 65–99)
Glucose-Capillary: 234 mg/dL — ABNORMAL HIGH (ref 65–99)
Glucose-Capillary: 174 mg/dL — ABNORMAL HIGH (ref 65–99)
Glucose-Capillary: 165 mg/dL — ABNORMAL HIGH (ref 65–99)
Glucose-Capillary: 193 mg/dL — ABNORMAL HIGH (ref 65–99)

## 2016-12-26 LAB — BASIC METABOLIC PANEL WITH GFR
Anion gap: 8 (ref 5–15)
BUN: 10 mg/dL (ref 6–20)
CO2: 18 mmol/L — ABNORMAL LOW (ref 22–32)
Calcium: 7.6 mg/dL — ABNORMAL LOW (ref 8.9–10.3)
Chloride: 110 mmol/L (ref 101–111)
Creatinine, Ser: 0.56 mg/dL (ref 0.44–1.00)
GFR calc Af Amer: >60 mL/min (ref >60)
GFR calc non Af Amer: >60 mL/min (ref >60)
Glucose, Bld: 217 mg/dL — ABNORMAL HIGH (ref 65–99)
Potassium: 3.5 mmol/L (ref 3.5–5.1)
Sodium: 136 mmol/L (ref 135–145)

## 2016-12-26 LAB — POCT I-STAT 7, (LYTES, BLD GAS, ICA,H+H)
Acid-base deficit: 7 mmol/L — ABNORMAL HIGH (ref 0.0–2.0)
Bicarbonate: 20.3 mmol/L (ref 20.0–28.0)
Calcium, Ion: 1.15 mmol/L (ref 1.15–1.40)
HCT: 38 % (ref 36.0–46.0)
Hemoglobin: 12.9 g/dL (ref 12.0–15.0)
O2 Saturation: 100 %
Patient temperature: 36
Potassium: 4 mmol/L (ref 3.5–5.1)
Sodium: 138 mmol/L (ref 135–145)
TCO2: 22 mmol/L (ref 0–100)
pCO2 arterial: 43.7 mmHg (ref 32.0–48.0)
pH, Arterial: 7.269 — ABNORMAL LOW (ref 7.350–7.450)
pO2, Arterial: 237 mmHg — ABNORMAL HIGH (ref 83.0–108.0)

## 2016-12-26 LAB — CBC
HCT: 36.4 % (ref 36.0–46.0)
Hemoglobin: 11.8 g/dL — ABNORMAL LOW (ref 12.0–15.0)
MCH: 27.6 pg (ref 26.0–34.0)
MCHC: 32.4 g/dL (ref 30.0–36.0)
MCV: 85 fL (ref 78.0–100.0)
Platelets: 259 10*3/uL (ref 150–400)
RBC: 4.28 MIL/uL (ref 3.87–5.11)
RDW: 15.6 % — ABNORMAL HIGH (ref 11.5–15.5)
WBC: 22.5 10*3/uL — ABNORMAL HIGH (ref 4.0–10.5)

## 2016-12-26 LAB — MAGNESIUM
Magnesium: 1.7 mg/dL (ref 1.7–2.4)
Magnesium: 2.6 mg/dL — ABNORMAL HIGH (ref 1.7–2.4)

## 2016-12-26 LAB — PHOSPHORUS
Phosphorus: 1.8 mg/dL — ABNORMAL LOW (ref 2.5–4.6)
Phosphorus: 2.3 mg/dL — ABNORMAL LOW (ref 2.5–4.6)

## 2016-12-26 MED ORDER — MAGNESIUM SULFATE 4 GM/100ML IV SOLN
4.0000 g | Freq: Once | INTRAVENOUS | Status: AC
  Administered 2016-12-26: 4 g via INTRAVENOUS
  Filled 2016-12-26: qty 100

## 2016-12-26 MED ORDER — PRO-STAT SUGAR FREE PO LIQD
60.0000 mL | Freq: Two times a day (BID) | ORAL | Status: DC
  Administered 2016-12-26 – 2017-01-01 (×11): 60 mL
  Filled 2016-12-26 (×11): qty 60

## 2016-12-26 MED ORDER — SODIUM CHLORIDE 0.9% FLUSH
10.0000 mL | Freq: Two times a day (BID) | INTRAVENOUS | Status: DC
  Administered 2016-12-26 – 2016-12-27 (×4): 10 mL
  Administered 2016-12-27: 20 mL
  Administered 2016-12-28: 10 mL
  Administered 2016-12-28: 20 mL
  Administered 2016-12-29: 10 mL
  Administered 2016-12-29: 30 mL
  Administered 2016-12-30 – 2017-01-04 (×11): 10 mL

## 2016-12-26 MED ORDER — VITAL HIGH PROTEIN PO LIQD
1000.0000 mL | ORAL | Status: DC
  Administered 2016-12-26: 1000 mL
  Administered 2016-12-27 – 2016-12-28 (×7)
  Administered 2016-12-29: 1000 mL
  Administered 2016-12-29 (×10)
  Administered 2016-12-30: 1000 mL
  Administered 2016-12-30 – 2016-12-31 (×2)
  Filled 2016-12-26 (×4): qty 1000

## 2016-12-26 MED ORDER — CHLORHEXIDINE GLUCONATE CLOTH 2 % EX PADS
6.0000 | MEDICATED_PAD | Freq: Every day | CUTANEOUS | Status: DC
  Administered 2016-12-26 – 2017-01-01 (×7): 6 via TOPICAL

## 2016-12-26 MED ORDER — PRO-STAT SUGAR FREE PO LIQD: 30.0000 mL | Freq: Two times a day (BID) | ORAL | Status: DC

## 2016-12-26 MED ORDER — SODIUM CHLORIDE 0.9% FLUSH: 10.0000 mL | INTRAVENOUS | Status: DC | PRN

## 2016-12-26 MED ORDER — POTASSIUM PHOSPHATES 15 MMOLE/5ML IV SOLN
20.0000 mmol | Freq: Once | INTRAVENOUS | Status: AC
  Administered 2016-12-26: 20 mmol via INTRAVENOUS
  Filled 2016-12-26: qty 6.67

## 2016-12-26 MED ORDER — VITAL HIGH PROTEIN PO LIQD: 1000.0000 mL | ORAL | Status: DC

## 2016-12-26 MED ORDER — ADULT MULTIVITAMIN LIQUID CH
15.0000 mL | Freq: Every day | ORAL | Status: DC
  Administered 2016-12-27 – 2017-01-02 (×6): 15 mL
  Filled 2016-12-26 (×11): qty 15

## 2016-12-26 NOTE — Anesthesia Postprocedure Evaluation (Addendum)
Anesthesia Post Note  Patient: [name] Plan  Procedure(s) Performed: Procedure(s) (LRB): RADIOLOGY WITH ANESTHESIA (N/A)  Patient location during evaluation: NICU Anesthesia Type: MAC Level of consciousness: patient remains intubated per anesthesia plan Vital Signs Assessment: post-procedure vital signs reviewed and stable Respiratory status: patient remains intubated per anesthesia plan and patient on ventilator - see flowsheet for VS Cardiovascular status: blood pressure returned to baseline Anesthetic complications: no       Last Vitals:  Vitals:   12/26/16 0630 12/26/16 0700  BP: 113/63 113/66  Pulse: 98 96  Resp: 18 16  Temp:      Last Pain:  Vitals:   12/26/16 0400  TempSrc: Axillary                 Shaira Sova COKER

## 2016-12-26 NOTE — Progress Notes (Signed)
No issues overnight.   EXAM:  BP 120/66   Pulse (!) 101   Temp 99.6 F (37.6 C) (Axillary)   Resp 18   Ht 5\' 4"  (1.626 m)   Wt 96.9 kg (213 lb 10 oz)   LMP  (LMP Unknown) Comment: vented pt sah  SpO2 99%   BMI 36.67 kg/m   Opens eyes to voice Breathing over vent Follows commands, protrudes tongue Wiggles finger RUE, no voluntary movement LUE/BLE  IMPRESSION:  41 y.o. female SAH d#2 s/p coiling right Pcom Quadriparesis of unclear etiology  PLAN: - Will plan on MRI brain/C spine today - Can liberalize SBP goal, <132mmHg today

## 2016-12-26 NOTE — Anesthesia Postprocedure Evaluation (Signed)
Anesthesia Post Note  Patient: Aimee [name]  Procedure(s) Performed: Procedure(s) (LRB): RADIOLOGY WITH ANESTHESIA (N/A)  Anesthesia Type: MAC       Last Vitals:  Vitals:   12/26/16 0630 12/26/16 0700  BP: 113/63 113/66  Pulse: 98 96  Resp: 18 16  Temp:      Last Pain:  Vitals:   12/26/16 0400  TempSrc: Axillary                 [name]

## 2016-12-26 NOTE — Progress Notes (Signed)
PT Cancellation Note  Patient Details Name: Aimee [name] MRN: [id number] DOB: 1976/07/25   Cancelled Treatment:    Reason Eval/Treat Not Completed: Patient not medically ready, remains intubated and sedated.   Duncan Dull 12/26/2016, [place], PT DPT  [phone]

## 2016-12-26 NOTE — Progress Notes (Signed)
Took pt on vent and back to MRI

## 2016-12-26 NOTE — H&P (Signed)
PULMONARY / CRITICAL CARE MEDICINE   Name: Aimee [name] MRN: [id number] DOB: [birth year]    ADMISSION DATE:  12/25/2016 CONSULTATION DATE:  12/25/2016  REFERRING MD:  Dr. Dina Rich, EDP  CHIEF COMPLAINT:  AMS  HISTORY OF PRESENT ILLNESS: 51 , SAH, coil, vent  SUBJECTIVE 5/22- coil , vent 5/23- paralysis unclear   VITAL SIGNS: BP 120/66   Pulse (!) 101   Temp 99.6 F (37.6 C) (Axillary)   Resp 18   Ht _0  (1.626 m)   Wt 96.9 kg (213 lb 10 oz)   LMP  (LMP Unknown) Comment: vented pt sah  SpO2 99%   BMI 36.67 kg/m   HEMODYNAMICS:    VENTILATOR SETTINGS: Vent Mode: PRVC FiO2 (%):  [40 %] 40 % Set Rate:  [18 bmp] 18 bmp Vt Set:  [440 mL] 440 mL PEEP:  [5 cmH20] 5 cmH20 Plateau Pressure:  [12 cmH20-18 cmH20] 13 cmH20  INTAKE / OUTPUT: I/O last 3 completed shifts: In: 5117.6 [I.V.:4607.6; Other:300; IV Piggyback:210] Out: 2805 [Urine:2105; Emesis/NG output:700]  PHYSICAL EXAMINATION: General: awakens , fc, calm on vent Neuro:  Perr, fc, moves below neck rt hand min HEENT: jvd  PULM: CTA CV: s1 s2 RRT no  GI: soft, bs wnl, nor ./g Extremities: no edema   LABS:  BMET  Recent Labs Lab 12/25/16 0403 12/25/16 0427 12/25/16 1552 12/26/16 0414  NA 134* 138 134* 136  K 2.9* 2.9* 4.2 3.5  CL 104 105 106 110  CO2 18*  --  18* 18*  BUN _1 CREATININE 0.70 0.50 0.66 0.56  GLUCOSE 193* 196* 189* 217*    Electrolytes  Recent Labs Lab 12/25/16 0403 12/25/16 1552 12/26/16 0414  CALCIUM 8.9 8.3* 7.6*  MG  --   --  1.7  PHOS  --   --  1.8*    CBC  Recent Labs Lab 12/25/16 0403 12/25/16 0427 12/25/16 0944 12/26/16 0414  WBC 16.1*  --  22.6* 22.5*  HGB 13.6 14.6 13.2 11.8*  HCT 41.0 43.0 39.9 36.4  PLT 339  --  292 259    Coag's  Recent Labs Lab 12/25/16 0403 12/25/16 0944  APTT 30 32  INR 1.04 1.08    Sepsis Markers No results for input(s): LATICACIDVEN, PROCALCITON, O2SATVEN in the last 168 hours.  ABG  Recent  Labs Lab 12/25/16 0703  PHART 7.353  PCO2ART 36.1  PO2ART 501.0*    Liver Enzymes  Recent Labs Lab 12/25/16 0403  AST 20  ALT 15  ALKPHOS 70  BILITOT 0.6  ALBUMIN 3.8    Cardiac Enzymes No results for input(s): TROPONINI, PROBNP in the last 168 hours.  Glucose  Recent Labs Lab 12/25/16 1149 12/25/16 1558 12/25/16 1930 12/25/16 2309 12/26/16 0345 12/26/16 0755  GLUCAP 178* 185* 178* 159* 195* 199*    Imaging Dg Chest Port 1 View  Result Date: 12/26/2016 CLINICAL DATA:  Intubation. EXAM: PORTABLE CHEST 1 VIEW COMPARISON:  12/26/2014 . FINDINGS: Endotracheal tube, NG tube, right IJ line stable position. Heart size stable. Interim partial clearing of mild left base subsegmental atelectasis . No pleural effusion or pneumothorax . IMPRESSION: 1. Lines and tubes in stable position. 2. Interim partial clearing of left base atelectasis. Mild residual. Electronically Signed   By: Marcello Moores  Register   On: 12/26/2016 07:20   Dg Chest Port 1 View  Result Date: 12/25/2016 CLINICAL DATA:  Displacement of central venous catheter EXAM: PORTABLE CHEST 1 VIEW COMPARISON:  12/25/2016 FINDINGS: Endotracheal tube  tip is approximately 12 mm superior to the carina. Esophageal tube tip beneath the left diaphragm in the left upper quadrant. Right-sided central venous catheter tip overlies the mid SVC. No pneumothorax. Low lung volume with left basilar atelectasis. Stable cardiomediastinal silhouette. IMPRESSION: 1. Right-sided central venous catheter tip projects over the SVC. No pneumothorax. 2. Low lung volumes with left basilar atelectasis or infiltrates Electronically Signed   By: Donavan Foil M.D.   On: 12/25/2016 23:47     STUDIES:  CT Head 5/22 > Diffuse subarachnoid hemorrhage, filling CSF spaces in the suprasellar and basal cisterns as well as bilateral sulci. Cause is not determined but presents high suspicion for ruptured intracranial aneurysm CTA Head/Neck 5/22 > 7 x 6 x 5 mm right  posterior communicating artery aneurysm. Aneurysm is directed posteriorly, and demonstrates a fairly narrow neck. Neuro endovascular consultation is recommended. Diffuse irregularity throughout the remaining intracranial arterial vasculature, suspicious for vasospasm secondary to acute subarachnoid hemorrhage  CULTURES: None.   ANTIBIOTICS: None.   SIGNIFICANT EVENTS: 5/22 > Presents to ED with new AMS   LINES/TUBES: ETT 5/22 >>  DISCUSSION: 41 year old female presents with N/V and Confusion. CTA head revealed 7 x 6 x 5 mm right posterior communicating artery aneurysm  ASSESSMENT / [name]:  PULMONARY A: Respiratory Insufficiency in setting of aneurysm and SAH P:   ABG reviewed, keep same MV I am interested to see NIF, VC also look to see drive  SBT with close observation apnea, TV alarms  CARDIOVASCULAR A:  HTN< SAH P:  Cardiac Monitoring  BP Goals noted, on nicardipine sems High risk vasospasm in 2 days MAP goals met  RENAL A:   Hypokalemia  Mild hypophos Mild low mag P:   Trend BMP in am  supp k , phos, mag in setting paralysis   GASTROINTESTINAL A:   Dysphagia  P:   NPO PPI Will began TF later today after MRI  HEMATOLOGIC A:   SAH P:  Hold anticoagulation  scd  INFECTIOUS A:   Leukocytosis  P:   Trend Fever curve  ENDOCRINE A:   DM    P:    SSI cbg  NEUROLOGIC A:   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  P:   RASS goal: -1/-2 Neurology following  Neurosurgery consulted  MRI Brain/C-Spine as limited movement ext Get nif, vc Wean propofol and fentanyl gtt to achieve RASS  After MRI   FAMILY  - Updates: Brother updated at bedside by me  - Inter-disciplinary family meet or Palliative Care meeting due by:  5/29   Ccm time 30 min   Lavon Paganini. Titus Mould, MD, [place] Pgr: [facility]

## 2016-12-26 NOTE — Progress Notes (Signed)
Inpatient Diabetes Program Recommendations  AACE/ADA: New Consensus Statement on Inpatient Glycemic Control (2015)  Target Ranges:  Prepandial:   less than 140 mg/dL      Peak postprandial:   less than 180 mg/dL (1-2 hours)      Critically ill patients:  140 - 180 mg/dL   Results for [name] Plan (MRN [id number]) as of 12/26/2016 11:46  Ref. Range 12/25/2016 04:18 12/25/2016 08:46 12/25/2016 11:49 12/25/2016 15:58 12/25/2016 19:30 12/25/2016 23:09 12/26/2016 03:45 12/26/2016 07:55 12/26/2016 11:41  Glucose-Capillary Latest Ref Range: 65 - 99 mg/dL 203 (H) 194 (H) 178 (H) 185 (H) 178 (H) 159 (H) 195 (H) 199 (H) 234 (H)   Review of Glycemic Control  Diabetes history: DM2 Outpatient Diabetes medications: Metformin 500 mg QAM Current orders for Inpatient glycemic control: Novolog 0-20 units TID with meals  Inpatient Diabetes Program Recommendations: Correction (SSI): Since patient is NPO, please consider changing frequency of CBGs and Novolog correction to Q4H. Insulin-Basal: If Decadron is continued and glucose is consistently > 180 mg/dl, please consider ordering Lantus 10 units Q24H. Please note that if Lantus is ordered, as steroids are tapered it will need to be adjusted as Decadron is tapered.  Thanks, Barnie Alderman, RN, MSN, CDE Diabetes Coordinator Inpatient Diabetes Program [phone] (Team Pager from 8am to 5pm)

## 2016-12-26 NOTE — Progress Notes (Signed)
STROKE TEAM PROGRESS NOTE   SUBJECTIVE (INTERVAL HISTORY) Husband is at the bedside.  Pt is still intubated on fentanyl. Much awake alert than yesterday. Had PCOM coiling successfully and no evidence of vasospasm on angio. TCD today no evidence of vasospasm either.     OBJECTIVE Temp:  [97.5 F (36.4 C)-99.2 F (37.3 C)] 98.3 F (36.8 C) (05/23 0400) Pulse Rate:  [66-114] 96 (05/23 0700) Cardiac Rhythm: Normal sinus rhythm (05/22 1930) Resp:  [15-26] 16 (05/23 0700) BP: (106-155)/(55-93) 113/66 (05/23 0700) SpO2:  [96 %-100 %] 99 % (05/23 0700) FiO2 (%):  [40 %] 40 % (05/23 0600) Weight:  [95 kg (209 lb 7 oz)-96.9 kg (213 lb 10 oz)] 96.9 kg (213 lb 10 oz) (05/23 0455)  CBC:  Recent Labs Lab 12/25/16 0403  12/25/16 0944 12/26/16 0414  WBC 16.1*  --  22.6* 22.5*  NEUTROABS 11.0*  --   --   --   HGB 13.6  < > 13.2 11.8*  HCT 41.0  < > 39.9 36.4  MCV 83.5  --  84.0 85.0  PLT 339  --  292 259  < > = values in this interval not displayed.  Basic Metabolic Panel:   Recent Labs Lab 12/25/16 1552 12/26/16 0414  NA 134* 136  K 4.2 3.5  CL 106 110  CO2 18* 18*  GLUCOSE 189* 217*  BUN 11 10  CREATININE 0.66 0.56  CALCIUM 8.3* 7.6*  MG  --  1.7  PHOS  --  1.8*    Lipid Panel:     Component Value Date/Time   TRIG 322 (H) 12/25/2016 0738   HgbA1c: No results found for: HGBA1C Urine Drug Screen:     Component Value Date/Time   LABOPIA NONE DETECTED 12/25/2016 0913   COCAINSCRNUR NONE DETECTED 12/25/2016 0913   LABBENZ POSITIVE (A) 12/25/2016 0913   AMPHETMU NONE DETECTED 12/25/2016 0913   THCU NONE DETECTED 12/25/2016 0913   LABBARB NONE DETECTED 12/25/2016 0913    Alcohol Level No results found for: [place] I have personally reviewed the radiological images below and agree with the radiology interpretations.  Ct Angio Head W Or Wo Contrast Ct Angio Neck W And/or Wo Contrast 12/25/2016  CTA NECK IMPRESSION:  Normal CTA of the neck.   CTA HEAD  IMPRESSION:  1. 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 Aneurysm is directed posteriorly, and demonstrates a fairly narrow neck. Neuro endovascular consultation is recommended.  2. Diffuse irregularity throughout the remaining intracranial arterial vasculature, suspicious for vasospasm secondary to acute subarachnoid hemorrhage.   Ct Head Wo Contrast 12/25/2016 Diffuse subarachnoid hemorrhage, filling CSF spaces in the suprasellar and basal cisterns as well as bilateral sulci. Cause is not determined but presents high suspicion for ruptured intracranial aneurysm.   TTE - Left ventricle: The cavity size was normal. Systolic function was   normal. The estimated ejection fraction was in the range of 55%   to 60%. Wall motion was normal; there were no regional wall   motion abnormalities. Doppler parameters are consistent with   abnormal left ventricular relaxation (grade 1 diastolic   dysfunction). Acoustic contrast opacification revealed no   evidence ofthrombus.  TCD 12/26/16 - no evidence of vasospasm   PHYSICAL EXAM  Temp:  [97.5 F (36.4 C)-99.2 F (37.3 C)] 98.3 F (36.8 C) (05/23 0400) Pulse Rate:  [66-114] 96 (05/23 0700) Resp:  [15-26] 16 (05/23 0700) BP: (106-155)/(55-93) 113/66 (05/23 0700) SpO2:  [96 %-100 %]  99 % (05/23 0700) FiO2 (%):  [40 %] 40 % (05/23 0600) Weight:  [95 kg (209 lb 7 oz)-96.9 kg (213 lb 10 oz)] 96.9 kg (213 lb 10 oz) (05/23 0455)  General - Well nourished, well developed, intubated on fentanyl.  Ophthalmologic - Fundi not visualized due to noncooperation.  Cardiovascular - Regular rate and rhythm.  Neuro - intubated on fentanyl, eyes open, able to follow central commands and commands on the right hand. PERRL, able to attend both sides, right gaze preference, mild left gaze palsy with nystagmus on left gaze, positive corneal and gag. RUE 3/5 proximal and distal, however, no movement at other limbs with pain stimulation. DTR  diminished and no babinski. Sensation and coordination not cooperative. Gait not tested.    ASSESSMENT/PLAN Ms. [name] Plan is a 41 y.o. female with history of hypertension and diabetes mellitus presenting with tetraplegia and altered mental status. She did not receive IV t-PA due to subarachnoid hemorrhage.  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  CTA neck - normal  Cerebral angio - no vasospasm.  TCD no vasospasm so far  2D Echo - EF 55-60%  VTE prophylaxis - SCDs Diet NPO time specified  No antithrombotic prior to admission, now on No antithrombotic secondary to [place].  S/p right PCOM coiling by Dr. Kathyrn Sheriff  On keppra for seizure prophylaxis  On nimodipine 60mg  Q4h  Ongoing aggressive stroke risk factor management  Therapy recommendations: pending  Disposition: Pending  Quadriplegia  LUE and LLE and RLE no movement, RUE 3/5  MRI brain and C-spine pending  Hypertension  Stable - IV Cardene  BP goal < 160  Long-term BP goal normotensive  Other Stroke Risk Factors  Obesity, Body mass index is 36.67 kg/m., recommend weight loss, diet and exercise as appropriate   Other Active Problems  Hypokalemia - 2.9 - supplement  Leukocytosis - 16.1  Hospital day # 1  This patient is critically ill due to diffuse basal SAH, ruptured PCOM aneurysm, HTN, quadriplegia and at significant risk of neurological worsening, death form recurrent SAH, vasospasm, hydrocephalus, seizure. This patient's care requires constant monitoring of vital signs, hemodynamics, respiratory and cardiac monitoring, review of multiple databases, neurological assessment, discussion with family, other specialists and medical decision making of high complexity. I spent 35 minutes of neurocritical care time in the care  of this patient.  Rosalin Hawking, MD PhD Stroke Neurology 12/26/2016 1:46 PM  To contact Stroke Continuity provider, please refer to [email or web address] After hours, contact General Neurology

## 2016-12-26 NOTE — Transfer of Care (Signed)
Immediate Anesthesia Transfer of Care Note  Patient: [name] Plan  Procedure(s) Performed: Procedure(s): RADIOLOGY WITH ANESTHESIA (N/A)  Patient Location: ICU  Anesthesia Type:General  Level of Consciousness: sedated  Airway & Oxygen Therapy: Patient remains intubated per anesthesia plan and Patient placed on Ventilator (see vital sign flow sheet for setting)  Post-op Assessment: Report given to RN and Post -op Vital signs reviewed and stable  Post vital signs: Reviewed and stable  Last Vitals:  Vitals:   12/26/16 0530 12/26/16 0600  BP: 106/64 109/68  Pulse: 92 99  Resp: 15 15  Temp:      Last Pain:  Vitals:   12/26/16 0400  TempSrc: Axillary         Complications: No apparent anesthesia complications

## 2016-12-26 NOTE — Progress Notes (Signed)
Transcranial Doppler  Date POD PCO2 HCT BP  MCA ACA PCA OPHT SIPH VERT Basilar  5/23/rs     Right  Left   76  44   -52  -48   42  31   27  41   29  48   *  -37   -35           Right  Left                                            Right  Left                                             Right  Left                                             Right  Left                                            Right  Left                                            Right  Left                                        MCA = Middle Cerebral Artery      OPHT = Opthalmic Artery     BASILAR = Basilar Artery   ACA = Anterior Cerebral Artery     SIPH = Carotid Siphon PCA = Posterior Cerebral Artery   VERT = Verterbral Artery                   Normal MCA = 62+\-12 ACA = 50+\-12 PCA = 42+\-23   12/26/16 - * not insonated rds

## 2016-12-26 NOTE — Progress Notes (Signed)
Initial Nutrition Assessment  DOCUMENTATION CODES:   Obesity unspecified  INTERVENTION:   Vital High Protein @ 25 ml/hr (600 ml/day) 60 ml Prostat BID Provides: 1000 kcal, 112 grams protein, and 501 ml free water.  TF regimen and propofol at current rate providing 1258 total kcal/day   NUTRITION DIAGNOSIS:   Inadequate oral intake related to inability to eat as evidenced by NPO status.  GOAL:   Provide needs based on ASPEN/SCCM guidelines  MONITOR:   TF tolerance, Vent status  REASON FOR ASSESSMENT:   Consult, Ventilator Enteral/tube feeding initiation and management  ASSESSMENT:   Pt with hx of DM admitted with [facility] s/p coiling.    Pt discussed during ICU rounds and with RN.  Patient is currently intubated on ventilator support Per neuro quadriparesis noted, MRI pending.   Propofol: 9.8 ml/hr provides: 258 kcal per day Brother at bedside, pt lives with him and his wife. She has had no recent weight changes and good appetite PTA. No findings on physical exam.   CBG's: 199-234  Diet Order:  Diet NPO time specified  Skin:  Reviewed, no issues  Last BM:  unknown  Height:   Ht Readings from Last 1 Encounters:  12/25/16 5\' 4"  (1.626 m)    Weight:   Wt Readings from Last 1 Encounters:  12/26/16 213 lb 10 oz (96.9 kg)    Ideal Body Weight:  54.5 kg  BMI:  Body mass index is 36.67 kg/m.  Estimated Nutritional Needs:   Kcal:  1308-6578  Protein:  >/= 109 grams  Fluid:  >1.5 L/day  EDUCATION NEEDS:   No education needs identified at this time  [place], [place], [place] Pager [phone] After Hours Pager

## 2016-12-26 NOTE — Progress Notes (Signed)
Pulmonary mechanics not done patient is sedated at this time. No distress or complications noted.

## 2016-12-26 NOTE — Progress Notes (Signed)
OT Cancellation Note  Patient Details Name: Aimee [name] MRN: [id number] DOB: January 28, 1976   Cancelled Treatment:    Reason Eval/Treat Not Completed: Patient not medically ready. Intubated/sedated.   [place], OT/L  [phone] 12/26/2016 12/26/2016, 7:03 AM

## 2016-12-27 ENCOUNTER — Inpatient Hospital Stay (HOSPITAL_COMMUNITY): Payer: Medicaid Other

## 2016-12-27 DIAGNOSIS — R400 Somnolence: Secondary | ICD-10-CM

## 2016-12-27 LAB — BASIC METABOLIC PANEL WITH GFR
Anion gap: 6 (ref 5–15)
BUN: 16 mg/dL (ref 6–20)
CO2: 19 mmol/L — ABNORMAL LOW (ref 22–32)
Calcium: 8.1 mg/dL — ABNORMAL LOW (ref 8.9–10.3)
Chloride: 113 mmol/L — ABNORMAL HIGH (ref 101–111)
Creatinine, Ser: 0.6 mg/dL (ref 0.44–1.00)
GFR calc Af Amer: >60 mL/min (ref >60)
GFR calc non Af Amer: >60 mL/min (ref >60)
Glucose, Bld: 203 mg/dL — ABNORMAL HIGH (ref 65–99)
Potassium: 4.1 mmol/L (ref 3.5–5.1)
Sodium: 138 mmol/L (ref 135–145)

## 2016-12-27 LAB — POCT I-STAT 3, ART BLOOD GAS (G3+)
Acid-base deficit: 6 mmol/L — ABNORMAL HIGH (ref 0.0–2.0)
Bicarbonate: 18.7 mmol/L — ABNORMAL LOW (ref 20.0–28.0)
O2 Saturation: 97 %
Patient temperature: 98
TCO2: 20 mmol/L (ref 0–100)
pCO2 arterial: 34.2 mmHg (ref 32.0–48.0)
pH, Arterial: 7.343 — ABNORMAL LOW (ref 7.350–7.450)
pO2, Arterial: 90 mmHg (ref 83.0–108.0)

## 2016-12-27 LAB — CBC WITH DIFFERENTIAL/PLATELET
Basophils Absolute: 0 10*3/uL (ref 0.0–0.1)
Basophils Relative: 0 %
Eosinophils Absolute: 0 10*3/uL (ref 0.0–0.7)
Eosinophils Relative: 0 %
HCT: 34.5 % — ABNORMAL LOW (ref 36.0–46.0)
Hemoglobin: 11.3 g/dL — ABNORMAL LOW (ref 12.0–15.0)
Lymphocytes Relative: 3 %
Lymphs Abs: 0.7 10*3/uL (ref 0.7–4.0)
MCH: 28.2 pg (ref 26.0–34.0)
MCHC: 32.8 g/dL (ref 30.0–36.0)
MCV: 86 fL (ref 78.0–100.0)
Monocytes Absolute: 0.7 10*3/uL (ref 0.1–1.0)
Monocytes Relative: 3 %
Neutro Abs: 22.2 10*3/uL — ABNORMAL HIGH (ref 1.7–7.7)
Neutrophils Relative %: 94 %
Platelets: 262 10*3/uL (ref 150–400)
RBC: 4.01 MIL/uL (ref 3.87–5.11)
RDW: 16.2 % — ABNORMAL HIGH (ref 11.5–15.5)
WBC: 23.6 10*3/uL — ABNORMAL HIGH (ref 4.0–10.5)

## 2016-12-27 LAB — GLUCOSE, CAPILLARY
Glucose-Capillary: 183 mg/dL — ABNORMAL HIGH (ref 65–99)
Glucose-Capillary: 157 mg/dL — ABNORMAL HIGH (ref 65–99)
Glucose-Capillary: 186 mg/dL — ABNORMAL HIGH (ref 65–99)
Glucose-Capillary: 172 mg/dL — ABNORMAL HIGH (ref 65–99)
Glucose-Capillary: 154 mg/dL — ABNORMAL HIGH (ref 65–99)
Glucose-Capillary: 167 mg/dL — ABNORMAL HIGH (ref 65–99)

## 2016-12-27 LAB — CSF CELL COUNT WITH DIFFERENTIAL
Eosinophils, CSF: 1 % (ref 0–1)
Eosinophils, CSF: 3 % — ABNORMAL HIGH (ref 0–1)
Lymphs, CSF: 63 % (ref 40–80)
Lymphs, CSF: 74 % (ref 40–80)
Monocyte-Macrophage-Spinal Fluid: 3 % — ABNORMAL LOW (ref 15–45)
Monocyte-Macrophage-Spinal Fluid: 6 % — ABNORMAL LOW (ref 15–45)
RBC Count, CSF: 32250 /mm3 — ABNORMAL HIGH
RBC Count, CSF: 34750 /mm3 — ABNORMAL HIGH
Segmented Neutrophils-CSF: 20 % — ABNORMAL HIGH (ref 0–6)
Segmented Neutrophils-CSF: 30 % — ABNORMAL HIGH (ref 0–6)
Tube #: 1
Tube #: 4
WBC, CSF: 12 /mm3 (ref 0–5)
WBC, CSF: 14 /mm3 (ref 0–5)

## 2016-12-27 LAB — HIV ANTIBODY (ROUTINE TESTING W REFLEX): HIV Screen 4th Generation wRfx: NONREACTIVE

## 2016-12-27 LAB — PROTEIN AND GLUCOSE, CSF
Glucose, CSF: 107 mg/dL — ABNORMAL HIGH (ref 40–70)
Total  Protein, CSF: 31 mg/dL (ref 15–45)

## 2016-12-27 LAB — RPR: RPR Ser Ql: NONREACTIVE

## 2016-12-27 LAB — PHOSPHORUS
Phosphorus: 1.9 mg/dL — ABNORMAL LOW (ref 2.5–4.6)
Phosphorus: 2.2 mg/dL — ABNORMAL LOW (ref 2.5–4.6)

## 2016-12-27 LAB — MAGNESIUM
Magnesium: 2.5 mg/dL — ABNORMAL HIGH (ref 1.7–2.4)
Magnesium: 2.3 mg/dL (ref 1.7–2.4)

## 2016-12-27 MED ORDER — INSULIN ASPART 100 UNIT/ML ~~LOC~~ SOLN
0.0000 [IU] | SUBCUTANEOUS | Status: DC
  Administered 2016-12-27 (×2): 4 [IU] via SUBCUTANEOUS
  Administered 2016-12-28: 3 [IU] via SUBCUTANEOUS
  Administered 2016-12-28: 4 [IU] via SUBCUTANEOUS
  Administered 2016-12-28: 3 [IU] via SUBCUTANEOUS
  Administered 2016-12-28 (×2): 4 [IU] via SUBCUTANEOUS
  Administered 2016-12-28: 3 [IU] via SUBCUTANEOUS
  Administered 2016-12-29 (×3): 4 [IU] via SUBCUTANEOUS
  Administered 2016-12-29 (×3): 3 [IU] via SUBCUTANEOUS
  Administered 2016-12-30: 4 [IU] via SUBCUTANEOUS
  Administered 2016-12-30: 3 [IU] via SUBCUTANEOUS
  Administered 2016-12-30: 4 [IU] via SUBCUTANEOUS
  Administered 2016-12-30: 3 [IU] via SUBCUTANEOUS
  Administered 2016-12-30: 4 [IU] via SUBCUTANEOUS
  Administered 2016-12-30 – 2016-12-31 (×3): 3 [IU] via SUBCUTANEOUS
  Administered 2016-12-31: 4 [IU] via SUBCUTANEOUS
  Administered 2016-12-31 (×2): 3 [IU] via SUBCUTANEOUS
  Administered 2017-01-01 (×2): 4 [IU] via SUBCUTANEOUS
  Administered 2017-01-01: 3 [IU] via SUBCUTANEOUS
  Administered 2017-01-01: 4 [IU] via SUBCUTANEOUS
  Administered 2017-01-01: 3 [IU] via SUBCUTANEOUS
  Administered 2017-01-01 – 2017-01-02 (×3): 4 [IU] via SUBCUTANEOUS
  Administered 2017-01-02: 7 [IU] via SUBCUTANEOUS
  Administered 2017-01-02: 4 [IU] via SUBCUTANEOUS
  Administered 2017-01-02: 7 [IU] via SUBCUTANEOUS
  Administered 2017-01-03 (×3): 3 [IU] via SUBCUTANEOUS
  Administered 2017-01-03 (×3): 4 [IU] via SUBCUTANEOUS
  Administered 2017-01-03: 3 [IU] via SUBCUTANEOUS
  Administered 2017-01-04 (×2): 4 [IU] via SUBCUTANEOUS
  Administered 2017-01-04: 7 [IU] via SUBCUTANEOUS
  Administered 2017-01-04: 4 [IU] via SUBCUTANEOUS
  Administered 2017-01-04 – 2017-01-05 (×2): 3 [IU] via SUBCUTANEOUS
  Administered 2017-01-05: 4 [IU] via SUBCUTANEOUS
  Administered 2017-01-05: 3 [IU] via SUBCUTANEOUS
  Administered 2017-01-05 (×2): 4 [IU] via SUBCUTANEOUS
  Administered 2017-01-05 – 2017-01-06 (×3): 3 [IU] via SUBCUTANEOUS
  Administered 2017-01-06: 4 [IU] via SUBCUTANEOUS
  Administered 2017-01-06 (×2): 3 [IU] via SUBCUTANEOUS
  Administered 2017-01-06 – 2017-01-07 (×2): 4 [IU] via SUBCUTANEOUS
  Administered 2017-01-07 (×2): 3 [IU] via SUBCUTANEOUS

## 2016-12-27 MED ORDER — VANCOMYCIN HCL IN DEXTROSE 1-5 GM/200ML-% IV SOLN
1000.0000 mg | Freq: Three times a day (TID) | INTRAVENOUS | Status: DC
  Administered 2016-12-27 – 2016-12-28 (×4): 1000 mg via INTRAVENOUS
  Filled 2016-12-27 (×6): qty 200

## 2016-12-27 MED ORDER — DEXTROSE 5 % IV SOLN
10.0000 mmol | Freq: Once | INTRAVENOUS | Status: AC
  Administered 2016-12-27: 10 mmol via INTRAVENOUS
  Filled 2016-12-27: qty 3.33

## 2016-12-27 MED ORDER — DEXTROSE 5 % IV SOLN
2.0000 g | Freq: Two times a day (BID) | INTRAVENOUS | Status: DC
  Administered 2016-12-27 – 2017-01-02 (×13): 2 g via INTRAVENOUS
  Filled 2016-12-27 (×13): qty 2

## 2016-12-27 MED ORDER — LIDOCAINE HCL (PF) 1 % IJ SOLN
4.0000 mL | Freq: Once | INTRAMUSCULAR | Status: AC
  Administered 2016-12-27: 4 mL via SUBCUTANEOUS

## 2016-12-27 MED ORDER — LABETALOL HCL 5 MG/ML IV SOLN
10.0000 mg | INTRAVENOUS | Status: DC | PRN
  Administered 2016-12-28: 10 mg via INTRAVENOUS
  Administered 2016-12-30: 20 mg via INTRAVENOUS
  Administered 2016-12-31: 10 mg via INTRAVENOUS
  Administered 2017-01-01 (×2): 20 mg via INTRAVENOUS
  Filled 2016-12-27 (×5): qty 4

## 2016-12-27 NOTE — Progress Notes (Signed)
CRITICAL VALUE ALERT  Critical Value:  WBC count in CSF; Tube #1: 14, Tube #4: 12  Date & Time Notified:  12/27/2016 @ 13:18  Provider Notified: Dr. Titus Mould  Orders Received/Actions taken: no orders received

## 2016-12-27 NOTE — Progress Notes (Signed)
eLink Physician-Brief Progress Note Patient Name: Aimee [name] DOB: April 13, 1976 MRN: [id number]   Date of Service  12/27/2016  HPI/Events of Note  Hyperglycemia Pt on steroids, tube feeds and intubated   eICU Interventions  Change insulin coverage from [facility] to q4 SSI coverage with resistant scale     Intervention Category Intermediate Interventions: Hyperglycemia - evaluation and treatment  Keeva Reisen 12/27/2016, 5:33 PM

## 2016-12-27 NOTE — Progress Notes (Signed)
PT Cancellation Note  Patient Details Name: Aimee [name] MRN: [id number] DOB: [birth year]   Cancelled Treatment:    Reason Eval/Treat Not Completed: Patient not medically ready Pt continues to be on bedrest. Will await increase in activity orders prior to PT evaluation.   [place] 12/27/2016, 8:19 AM Wray Kearns, PT, DPT [phone]

## 2016-12-27 NOTE — Progress Notes (Signed)
FC not NIF can be performed at this time- PT not responding well enough to complete.RN aware.

## 2016-12-27 NOTE — Progress Notes (Signed)
STROKE TEAM PROGRESS NOTE   SUBJECTIVE (INTERVAL HISTORY) Brother is at the bedside.  Pt is still intubated and largely quadriplegia. MRI only showed scattered b/l hemisphere and left cerebellar infarcts, could be related to cath anigo or mild vasospasm but not able to explain quadriplegia. LP done to rule out infection.    OBJECTIVE Temp:  [98 F (36.7 C)-99.8 F (37.7 C)] 99.8 F (37.7 C) (05/24 0741) Pulse Rate:  [69-110] 76 (05/24 0700) Cardiac Rhythm: Normal sinus rhythm (05/24 0400) Resp:  [14-31] 20 (05/24 0700) BP: (112-178)/(66-82) 128/78 (05/24 0700) SpO2:  [98 %-100 %] 98 % (05/24 0700) Arterial Line BP: (147-174)/(64-78) 148/66 (05/24 0700) FiO2 (%):  [40 %] 40 % (05/24 0357) Weight:  [96.2 kg (212 lb 1.3 oz)] 96.2 kg (212 lb 1.3 oz) (05/24 0433)  CBC:  Recent Labs Lab 12/25/16 0403  12/26/16 0414 12/27/16 0430  WBC 16.1*  < > 22.5* 23.6*  NEUTROABS 11.0*  --   --  22.2*  HGB 13.6  < > 11.8* 11.3*  HCT 41.0  < > 36.4 34.5*  MCV 83.5  < > 85.0 86.0  PLT 339  < > 259 262  < > = values in this interval not displayed.  Basic Metabolic Panel:   Recent Labs Lab 12/26/16 0414 12/26/16 2150 12/27/16 0430  NA 136  --  138  K 3.5  --  4.1  CL 110  --  113*  CO2 18*  --  19*  GLUCOSE 217*  --  203*  BUN 10  --  16  CREATININE 0.56  --  0.60  CALCIUM 7.6*  --  8.1*  MG 1.7 2.6* 2.5*  PHOS 1.8* 2.3* 1.9*    Lipid Panel:     Component Value Date/Time   TRIG 322 (H) 12/25/2016 0738   HgbA1c: No results found for: HGBA1C Urine Drug Screen:     Component Value Date/Time   LABOPIA NONE DETECTED 12/25/2016 0913   COCAINSCRNUR NONE DETECTED 12/25/2016 0913   LABBENZ POSITIVE (A) 12/25/2016 0913   AMPHETMU NONE DETECTED 12/25/2016 0913   THCU NONE DETECTED 12/25/2016 0913   LABBARB NONE DETECTED 12/25/2016 0913    Alcohol Level No results found for: [place] I have personally reviewed the radiological images below and agree with the radiology  interpretations.  Ct Angio Head W Or Wo Contrast Ct Angio Neck W And/or Wo Contrast 12/25/2016  CTA NECK IMPRESSION:  Normal CTA of the neck.   CTA HEAD IMPRESSION:  1. 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 Aneurysm is directed posteriorly, and demonstrates a fairly narrow neck. Neuro endovascular consultation is recommended.  2. Diffuse irregularity throughout the remaining intracranial arterial vasculature, suspicious for vasospasm secondary to acute subarachnoid hemorrhage.   Ct Head Wo Contrast 12/25/2016 Diffuse subarachnoid hemorrhage, filling CSF spaces in the suprasellar and basal cisterns as well as bilateral sulci. Cause is not determined but presents high suspicion for ruptured intracranial aneurysm.   TTE - Left ventricle: The cavity size was normal. Systolic function was   normal. The estimated ejection fraction was in the range of 55%   to 60%. Wall motion was normal; there were no regional wall   motion abnormalities. Doppler parameters are consistent with   abnormal left ventricular relaxation (grade 1 diastolic   dysfunction). Acoustic contrast opacification revealed no   evidence ofthrombus.  TCD 12/26/16 - no evidence of vasospasm  Mr Brain and C-spine Wo Contrast 12/26/2016 IMPRESSION: 1. 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 More confluent acute infarcts in the high frontoparietal regions bilaterally. 2. Subarachnoid and small volume intraventricular hemorrhage. 3. Mild cervical spondylosis.  No spinal stenosis. 4. Unremarkable appearance of the cervical spinal cord.    PHYSICAL EXAM  Temp:  [98 F (36.7 C)-99.8 F (37.7 C)] 99.8 F (37.7 C) (05/24 0741) Pulse Rate:  [69-110] 76 (05/24 0700) Resp:  [14-31] 20 (05/24 0700) BP: (112-178)/(66-82) 128/78 (05/24 0700) SpO2:  [98 %-100 %] 98 % (05/24 0700) Arterial Line BP: (147-174)/(64-78) 148/66 (05/24 0700) FiO2 (%):  [40 %] 40 % (05/24  0357) Weight:  [96.2 kg (212 lb 1.3 oz)] 96.2 kg (212 lb 1.3 oz) (05/24 0433)  General - Well nourished, well developed, intubated.  Ophthalmologic - Fundi not visualized due to noncooperation.  Cardiovascular - Regular rate and rhythm.  Neuro - intubated on fentanyl, eyes open, able to follow central commands and commands on the right hand. PERRL, able to attend both sides, right gaze preference, mild left gaze palsy with nystagmus on left gaze, positive corneal and gag. Right hand with finger movement and grip, however, no movement at other limbs with pain stimulation. DTR diminished and no babinski. Sensation and coordination not cooperative. Gait not tested.    ASSESSMENT/PLAN Ms. [name] Plan is a 41 y.o. female with history of hypertension and diabetes mellitus presenting with tetraplegia and altered mental status. She did not receive IV t-PA due to subarachnoid hemorrhage.  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  CTA neck - normal  Cerebral angio - no vasospasm.  TCD no vasospasm so far  2D Echo - EF 55-60%  VTE prophylaxis - SCDs Diet NPO time specified  No antithrombotic prior to admission, now on No antithrombotic secondary to [place].  S/p right PCOM coiling by Dr. 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  Still no good explanation of quadriplegia - may related to "brainstem shock" due to concentrated basal cistern blood product - expect pt symptom will improve over time if this is the  case  Agree with LP - may be more therapeutic rather than diagnositc  Agree with repeat MRI brain and C-spine in 2-3 days  Agree with repeat LP if needed to mobilize blood product in subarachnoid space.  Hypertension  Stable - IV Cardene  BP goal < 160  Long-term BP goal normotensive  Other Stroke Risk Factors  Obesity, Body mass index is 36.4 kg/m., recommend weight loss, diet and exercise as appropriate   Other Active Problems  Hypokalemia - 2.9 - supplement  Leukocytosis - 16.1  Hospital day # 2  This patient is critically ill due to diffuse basal SAH, ruptured PCOM aneurysm, HTN, quadriplegia and at significant risk of neurological worsening, death form recurrent SAH, vasospasm, hydrocephalus, seizure. This patient's care requires constant monitoring of vital signs, hemodynamics, respiratory and cardiac monitoring, review of multiple databases, neurological assessment, discussion with family, other specialists and medical decision making of high complexity. I spent 40 minutes of neurocritical care  time in the care of this patient. Case discussed with Dr. Kathyrn Sheriff and Dr. Titus Mould. I also updated pt brother at bedside.  Rosalin Hawking, MD PhD Stroke Neurology 12/27/2016 11:07 AM   To contact Stroke Continuity provider, please refer to [email or web address] After hours, contact General Neurology

## 2016-12-27 NOTE — Progress Notes (Signed)
OT Cancellation Note  Patient Details Name: Aimee [name] Plan MRN: [id number] DOB: 09/22/1975   Cancelled Treatment:    Reason Eval/Treat Not Completed: Patient not medically ready (sedated. vent)  [place], OT/L  [phone] 12/27/2016 12/27/2016, 7:43 AM

## 2016-12-27 NOTE — Progress Notes (Signed)
CVP set up provided to PT bedside- RN aware.

## 2016-12-27 NOTE — Progress Notes (Signed)
Pharmacy Antibiotic Note  [name] Plan is a 41 y.o. female admitted on 12/25/2016 with [facility] and aneurysm, now w/ concern for bacteremia.  Pharmacy has been consulted for vancomycin dosing.  Plan: Vancomycin 1000mg  IV every 8 hours.  Goal trough 15-20 mcg/mL.  Height: 5\' 4"  (162.6 cm) Weight: 212 lb 1.3 oz (96.2 kg) IBW/kg (Calculated) : 54.7  Temp (24hrs), Avg:99 F (37.2 C), Min:98 F (36.7 C), Max:99.8 F (37.7 C)   Recent Labs Lab 12/25/16 0403 12/25/16 0427 12/25/16 0944 12/25/16 1552 12/26/16 0414 12/27/16 0430  WBC 16.1*  --  22.6*  --  22.5* 23.6*  CREATININE 0.70 0.50  --  0.66 0.56 0.60    Estimated Creatinine Clearance: 104.2 mL/min (by C-G formula based on SCr of 0.6 mg/dL).    No Known Allergies   Thank you for allowing pharmacy to be a part of this patient's care.  Wynona Neat, PharmD, BCPS  12/27/2016 7:52 AM

## 2016-12-27 NOTE — Procedures (Signed)
Lumbar Puncture Procedure Note  Pre-operative Diagnosis: r/o pressure brainstem ( there need) , r/o enceph, meningitis  Post-operative Diagnosis: same  Indications: Diagnostic and ther  Procedure Details   Consent: Informed consent was obtained. Risks of the procedure were discussed including: infection, bleeding, pain and headache.  The patient was positioned under sterile conditions. Betadine solution and sterile drapes were utilized. A spinal needle was inserted at the L4 - L5 interspace.  Spinal fluid was obtained and sent to the laboratory.  Findings 34mL of bloody thick spinal fluid was obtained. Opening Pressure: low cm H2O pressure. Closing Pressure: low cm H2O pressure.  Complications:  None; patient tolerated the procedure well.        Condition: stable  Plan Bed rest for 5 hours.   Aimee [name]. Titus Mould, MD, [place] Pgr: [facility]

## 2016-12-27 NOTE — Progress Notes (Signed)
Patient is flipped back to FS at this time. Pt was weaning well prior to ventilation mode switch. Pt had good volumes and minute ventilation. SBI was acceptable as well. No distress or complications noted. RN at bedside.

## 2016-12-27 NOTE — Progress Notes (Signed)
Pulmonary mechanics FVC/NIF not done patient is sedated at this time. Resting comfortably.  No distress or complications noted.

## 2016-12-27 NOTE — Progress Notes (Signed)
No issues overnight.   EXAM:  BP 128/74   Pulse 73   Temp 99.8 F (37.7 C) (Axillary)   Resp 16   Ht 5\' 4"  (1.626 m)   Wt 96.2 kg (212 lb 1.3 oz)   LMP  (LMP Unknown) Comment: vented pt sah  SpO2 98%   BMI 36.40 kg/m   Awake, alert Nods to questions Moves fingers on right to command No voluntary movement LUE/BLE. Does slightly w/d LLE to pain.  MRI Brain/Cspine reviewed. No large stroke in brain, no obvious compressive pathology at brainstem or Cspine. No obvious cspine stroke  IMPRESSION:  41 y.o. female [place] d#3 s/p Pcom coiling, quadriparesis of unclear etiology. No compressive pathology or stroke to explain weakness.  PLAN: - Planning on LP today, VDRL etc sent this am - Cont supportive care for SAH, nimotop, TCD.  - SBP up to 173mmHg

## 2016-12-27 NOTE — H&P (Addendum)
PULMONARY / CRITICAL CARE MEDICINE   Name: Aimee [name] MRN: [id number] DOB: 11/21/1975    ADMISSION DATE:  12/25/2016 CONSULTATION DATE:  12/25/2016  REFERRING MD:  Dr. Dina Rich, EDP  CHIEF COMPLAINT:  AMS  HISTORY OF PRESENT ILLNESS: 73 , SAH, coil, vent  SUBJECTIVE 5/22- coil , vent 5/23- paralysis unclear   VITAL SIGNS: BP 128/78   Pulse 76   Temp 99 F (37.2 C) (Axillary)   Resp 20   Ht '5\' 4"'  (1.626 m)   Wt 96.2 kg (212 lb 1.3 oz)   LMP  (LMP Unknown) Comment: vented pt sah  SpO2 98%   BMI 36.40 kg/m   HEMODYNAMICS:    VENTILATOR SETTINGS: Vent Mode: PRVC FiO2 (%):  [40 %] 40 % Set Rate:  [18 bmp] 18 bmp Vt Set:  [420 mL-440 mL] 430 mL PEEP:  [5 cmH20] 5 cmH20 Plateau Pressure:  [13 cmH20-16 cmH20] 16 cmH20  INTAKE / OUTPUT: I/O last 3 completed shifts: In: 8060 [I.V.:6823.7; NG/GT:314.6; IV Piggyback:921.7] Out: 5974 [Urine:3095; Emesis/NG output:700]  PHYSICAL EXAMINATION: General: awakens , rass 0 Neuro: perr, limited movement to deep pain all ext, ref;exes low, reports sens  HEENT: jvd , line wnl, ett PULM: CTA CV:  s1 s2 RRR GI:oft, bs wnl , no r/g Extremities: min edema    LABS:  BMET  Recent Labs Lab 12/25/16 1552 12/25/16 2034 12/26/16 0414 12/27/16 0430  NA 134* 138 136 138  K 4.2 4.0 3.5 4.1  CL 106  --  110 113*  CO2 18*  --  18* 19*  BUN 11  --  10 16  CREATININE 0.66  --  0.56 0.60  GLUCOSE 189*  --  217* 203*    Electrolytes  Recent Labs Lab 12/25/16 1552 12/26/16 0414 12/26/16 2150 12/27/16 0430  CALCIUM 8.3* 7.6*  --  8.1*  MG  --  1.7 2.6* 2.5*  PHOS  --  1.8* 2.3* 1.9*    CBC  Recent Labs Lab 12/25/16 0944 12/25/16 2034 12/26/16 0414 12/27/16 0430  WBC 22.6*  --  22.5* 23.6*  HGB 13.2 12.9 11.8* 11.3*  HCT 39.9 38.0 36.4 34.5*  PLT 292  --  259 262    Coag's  Recent Labs Lab 12/25/16 0403 12/25/16 0944  APTT 30 32  INR 1.04 1.08    Sepsis Markers No results for input(s):  LATICACIDVEN, PROCALCITON, O2SATVEN in the last 168 hours.  ABG  Recent Labs Lab 12/25/16 0703 12/25/16 2034 12/27/16 0408  PHART 7.353 7.269* 7.343*  PCO2ART 36.1 43.7 34.2  PO2ART 501.0* 237.0* 90.0    Liver Enzymes  Recent Labs Lab 12/25/16 0403  AST 20  ALT 15  ALKPHOS 70  BILITOT 0.6  ALBUMIN 3.8    Cardiac Enzymes No results for input(s): TROPONINI, PROBNP in the last 168 hours.  Glucose  Recent Labs Lab 12/26/16 0755 12/26/16 1141 12/26/16 1718 12/26/16 1958 12/26/16 2317 12/27/16 0310  GLUCAP 199* 234* 174* 165* 193* 183*    Imaging Mr Brain Wo Contrast  Result Date: 12/26/2016 CLINICAL DATA:  Subarachnoid hemorrhage. Status post coiling of right posterior communicating aneurysm. Quadriparesis. EXAM: MRI HEAD WITHOUT CONTRAST MRI CERVICAL SPINE WITHOUT CONTRAST TECHNIQUE: Multiplanar, multiecho pulse sequences of the brain and surrounding structures, and cervical spine, to include the craniocervical junction and cervicothoracic junction, were obtained without intravenous contrast. COMPARISON:  Head CT and head and neck CTA 12/25/2016 FINDINGS: MRI HEAD FINDINGS Brain: Subcentimeter acute infarcts are scattered throughout the right greater than left cerebral  hemispheres, with a single small infarct in the left cerebellum as well, embolic in appearance. Small regions of more confluent cortically based restricted diffusion are present in the frontoparietal regions bilaterally at the vertex with associated gyral edema. Abnormal FLAIR signal throughout the cerebral sulci bilaterally is compatible with known subarachnoid hemorrhage, possibly with contribution from supplemental oxygenation given patient's ventilated status also contributing. A small amount of blood is present in the occipital horns of both lateral ventricles as well as in the fourth ventricle. There is no ventriculomegaly. Cerebral volume is normal. No mass or midline shift is seen. Vascular: Major  intracranial vascular flow voids are preserved. Susceptibility artifact from right posterior communicating aneurysm coils. Skull and upper cervical spine: Hyperostosis of the skull. Sinuses/Orbits: Bilateral cataract extraction. Complete left maxillary sinus opacification with variably complex material. Other: None. MRI CERVICAL SPINE FINDINGS Alignment: Normal. Vertebrae: No fracture or marrow edema identified. Subcentimeter C6 vertebral body lesion demonstrates sclerosis centrally and fatty marrow peripherally, likely benign. Cord: Normal signal and morphology. Posterior Fossa, vertebral arteries, paraspinal tissues: Endotracheal and enteric tubes are noted. Disc levels: C2-3:  Negative. C3-4: Mild left uncovertebral spurring results in minimal left neural foraminal narrowing. No spinal stenosis. C4-5: Mild left neural foraminal narrowing due to uncovertebral spurring. No spinal stenosis. C5-6: Tiny central/right central disc protrusion without significant stenosis. C6-7:  Negative. C7-T1:  Negative. IMPRESSION: 1. 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 More confluent acute infarcts in the high frontoparietal regions bilaterally. 2. Subarachnoid and small volume intraventricular hemorrhage. 3. Mild cervical spondylosis.  No spinal stenosis. 4. Unremarkable appearance of the cervical spinal cord. Electronically Signed   By: Logan Bores M.D.   On: 12/26/2016 17:11   Mr Cervical Spine Wo Contrast  Result Date: 12/26/2016 CLINICAL DATA:  Subarachnoid hemorrhage. Status post coiling of right posterior communicating aneurysm. Quadriparesis. EXAM: MRI HEAD WITHOUT CONTRAST MRI CERVICAL SPINE WITHOUT CONTRAST TECHNIQUE: Multiplanar, multiecho pulse sequences of the brain and surrounding structures, and cervical spine, to include the craniocervical junction and cervicothoracic junction, were obtained without intravenous contrast. COMPARISON:  Head CT and head and neck CTA  12/25/2016 FINDINGS: MRI HEAD FINDINGS Brain: Subcentimeter acute infarcts are scattered throughout the right greater than left cerebral hemispheres, with a single small infarct in the left cerebellum as well, embolic in appearance. Small regions of more confluent cortically based restricted diffusion are present in the frontoparietal regions bilaterally at the vertex with associated gyral edema. Abnormal FLAIR signal throughout the cerebral sulci bilaterally is compatible with known subarachnoid hemorrhage, possibly with contribution from supplemental oxygenation given patient's ventilated status also contributing. A small amount of blood is present in the occipital horns of both lateral ventricles as well as in the fourth ventricle. There is no ventriculomegaly. Cerebral volume is normal. No mass or midline shift is seen. Vascular: Major intracranial vascular flow voids are preserved. Susceptibility artifact from right posterior communicating aneurysm coils. Skull and upper cervical spine: Hyperostosis of the skull. Sinuses/Orbits: Bilateral cataract extraction. Complete left maxillary sinus opacification with variably complex material. Other: None. MRI CERVICAL SPINE FINDINGS Alignment: Normal. Vertebrae: No fracture or marrow edema identified. Subcentimeter C6 vertebral body lesion demonstrates sclerosis centrally and fatty marrow peripherally, likely benign. Cord: Normal signal and morphology. Posterior Fossa, vertebral arteries, paraspinal tissues: Endotracheal and enteric tubes are noted. Disc levels: C2-3:  Negative. C3-4: Mild left uncovertebral spurring results in minimal left neural foraminal narrowing. No spinal stenosis. C4-5: Mild left neural foraminal narrowing due to uncovertebral spurring. No  spinal stenosis. C5-6: Tiny central/right central disc protrusion without significant stenosis. C6-7:  Negative. C7-T1:  Negative. IMPRESSION: 1. Scattered acute embolic infarcts in both cerebral hemispheres  and left cerebellum. More confluent acute infarcts in the high frontoparietal regions bilaterally. 2. Subarachnoid and small volume intraventricular hemorrhage. 3. Mild cervical spondylosis.  No spinal stenosis. 4. Unremarkable appearance of the cervical spinal cord. Electronically Signed   By: Logan Bores M.D.   On: 12/26/2016 17:11     STUDIES:  CT Head 5/22 > Diffuse subarachnoid hemorrhage, filling CSF spaces in the suprasellar and basal cisterns as well as bilateral sulci. 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 Aneurysm is directed posteriorly, and demonstrates a fairly narrow neck. Neuro endovascular consultation is recommended. 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 More confluent acute infarcts in the high frontoparietal regions bilaterally. 2. Subarachnoid and small volume intraventricular hemorrhage. 3. Mild cervical spondylosis.  No spinal stenosis. 4. Unremarkable appearance of the cervical spinal cord.  CULTURES: None.   ANTIBIOTICS: None.   SIGNIFICANT EVENTS: 5/22 > Presents to ED with new AMS   LINES/TUBES: ETT 5/22 >>  DISCUSSION: 41 year old female presents with N/V and Confusion. 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 Nor cerebellum? Would d/w ns Assess BC, echo, rpr, hiv May need TEE Consider LP? nimodiine tcd day 3  MAP goals met, consider 70-105 range fo rnow Consider empiric abx BB for now   FAMILY  - Updates: Brother updated at bedside by me  - Inter-disciplinary family meet or Palliative Care meeting due by:  5/29   Ccm time 30 min   Lavon Paganini. Titus Mould, MD, [place] Pgr: [facility]

## 2016-12-28 ENCOUNTER — Inpatient Hospital Stay (HOSPITAL_COMMUNITY): Payer: Medicaid Other

## 2016-12-28 DIAGNOSIS — R569 Unspecified convulsions: Secondary | ICD-10-CM

## 2016-12-28 DIAGNOSIS — G9340 Encephalopathy, unspecified: Secondary | ICD-10-CM

## 2016-12-28 DIAGNOSIS — I609 Nontraumatic subarachnoid hemorrhage, unspecified: Secondary | ICD-10-CM

## 2016-12-28 DIAGNOSIS — R0603 Acute respiratory distress: Secondary | ICD-10-CM

## 2016-12-28 LAB — BPAM RBC
Blood Product Expiration Date: 201806182359
Blood Product Expiration Date: 201806182359
Blood Product Expiration Date: 201806182359
Blood Product Expiration Date: 201806182359
Unit Type and Rh: 5100
Unit Type and Rh: 5100
Unit Type and Rh: 5100
Unit Type and Rh: 5100

## 2016-12-28 LAB — CBC WITH DIFFERENTIAL/PLATELET
Basophils Absolute: 0 10*3/uL (ref 0.0–0.1)
Basophils Relative: 0 %
Eosinophils Absolute: 0 10*3/uL (ref 0.0–0.7)
Eosinophils Relative: 0 %
HCT: 35.8 % — ABNORMAL LOW (ref 36.0–46.0)
Hemoglobin: 11.4 g/dL — ABNORMAL LOW (ref 12.0–15.0)
Lymphocytes Relative: 3 %
Lymphs Abs: 0.7 10*3/uL (ref 0.7–4.0)
MCH: 27.5 pg (ref 26.0–34.0)
MCHC: 31.8 g/dL (ref 30.0–36.0)
MCV: 86.3 fL (ref 78.0–100.0)
Monocytes Absolute: 1.1 10*3/uL — ABNORMAL HIGH (ref 0.1–1.0)
Monocytes Relative: 5 %
Neutro Abs: 18.8 10*3/uL — ABNORMAL HIGH (ref 1.7–7.7)
Neutrophils Relative %: 92 %
Platelets: 251 10*3/uL (ref 150–400)
RBC: 4.15 MIL/uL (ref 3.87–5.11)
RDW: 16.2 % — ABNORMAL HIGH (ref 11.5–15.5)
WBC: 20.6 10*3/uL — ABNORMAL HIGH (ref 4.0–10.5)

## 2016-12-28 LAB — COMPREHENSIVE METABOLIC PANEL WITH GFR
ALT: 51 U/L (ref 14–54)
AST: 46 U/L — ABNORMAL HIGH (ref 15–41)
Albumin: 3.2 g/dL — ABNORMAL LOW (ref 3.5–5.0)
Alkaline Phosphatase: 48 U/L (ref 38–126)
Anion gap: 6 (ref 5–15)
BUN: 21 mg/dL — ABNORMAL HIGH (ref 6–20)
CO2: 22 mmol/L (ref 22–32)
Calcium: 8.1 mg/dL — ABNORMAL LOW (ref 8.9–10.3)
Chloride: 111 mmol/L (ref 101–111)
Creatinine, Ser: 0.58 mg/dL (ref 0.44–1.00)
GFR calc Af Amer: >60 mL/min (ref >60)
GFR calc non Af Amer: >60 mL/min (ref >60)
Glucose, Bld: 189 mg/dL — ABNORMAL HIGH (ref 65–99)
Potassium: 3.9 mmol/L (ref 3.5–5.1)
Sodium: 139 mmol/L (ref 135–145)
Total Bilirubin: 0.5 mg/dL (ref 0.3–1.2)
Total Protein: 6.3 g/dL — ABNORMAL LOW (ref 6.5–8.1)

## 2016-12-28 LAB — TYPE AND SCREEN
ABO/RH(D): O POS
Antibody Screen: NEGATIVE
Unit division: 0
Unit division: 0
Unit division: 0
Unit division: 0

## 2016-12-28 LAB — GLUCOSE, CAPILLARY
Glucose-Capillary: 178 mg/dL — ABNORMAL HIGH (ref 65–99)
Glucose-Capillary: 137 mg/dL — ABNORMAL HIGH (ref 65–99)
Glucose-Capillary: 185 mg/dL — ABNORMAL HIGH (ref 65–99)
Glucose-Capillary: 145 mg/dL — ABNORMAL HIGH (ref 65–99)
Glucose-Capillary: 146 mg/dL — ABNORMAL HIGH (ref 65–99)

## 2016-12-28 LAB — ECHOCARDIOGRAM COMPLETE
Height: 64 in
Weight: 3386.27 [oz_av]

## 2016-12-28 LAB — MAGNESIUM: Magnesium: 2.2 mg/dL (ref 1.7–2.4)

## 2016-12-28 LAB — HSV DNA BY PCR (REFERENCE LAB)
HSV 1 DNA: NEGATIVE
HSV 2 DNA: NEGATIVE

## 2016-12-28 LAB — VDRL, CSF: VDRL Quant, CSF: NONREACTIVE

## 2016-12-28 LAB — TRIGLYCERIDES: Triglycerides: 209 mg/dL — ABNORMAL HIGH (ref <150)

## 2016-12-28 LAB — VANCOMYCIN, TROUGH: Vancomycin Tr: 10 ug/mL — ABNORMAL LOW (ref 15–20)

## 2016-12-28 LAB — PHOSPHORUS: Phosphorus: 2 mg/dL — ABNORMAL LOW (ref 2.5–4.6)

## 2016-12-28 MED ORDER — VANCOMYCIN HCL 10 G IV SOLR: 1500.0000 mg | Freq: Three times a day (TID) | INTRAVENOUS | Status: DC

## 2016-12-28 MED ORDER — VANCOMYCIN HCL 10 G IV SOLR
1500.0000 mg | Freq: Three times a day (TID) | INTRAVENOUS | Status: DC
  Administered 2016-12-28 – 2016-12-29 (×2): 1500 mg via INTRAVENOUS
  Filled 2016-12-28 (×3): qty 1500

## 2016-12-28 NOTE — H&P (Addendum)
PULMONARY / CRITICAL CARE MEDICINE   Name: Aimee [name] MRN: [id number] DOB: [birth year]    ADMISSION DATE:  12/25/2016 CONSULTATION DATE:  12/25/2016  REFERRING MD:  Dr. Dina Rich, EDP  CHIEF COMPLAINT:  AMS  HISTORY OF PRESENT ILLNESS: 17 , SAH, coil, vent  SUBJECTIVE 5/22- coil , vent 5/23- paralysis unclear, LP done  VITAL SIGNS: BP (!) 167/88 Comment: SEDATION TURNED BACK ON  Pulse 94   Temp 99.4 F (37.4 C) (Axillary)   Resp (!) 21   Ht 5' 4" (1.626 m)   Wt 96 kg (211 lb 10.3 oz)   LMP  (LMP Unknown) Comment: vented pt sah  SpO2 99%   BMI 36.33 kg/m   HEMODYNAMICS:    VENTILATOR SETTINGS: Vent Mode: CPAP;PSV FiO2 (%):  [30 %] 30 % Set Rate:  [18 bmp] 18 bmp Vt Set:  [430 mL] 430 mL PEEP:  [5 cmH20] 5 cmH20 Pressure Support:  [5 cmH20-10 cmH20] 8 cmH20 Plateau Pressure:  [12 cmH20-15 cmH20] 15 cmH20  INTAKE / OUTPUT: I/O last 3 completed shifts: In: 7446.1 [I.V.:5140.5; NG/GT:1139.6; IV Piggyback:1166] Out: 3085 [Urine:3085]  PHYSICAL EXAMINATION: General:  Awake, fc less, vent Neuro: perrl, prop back on, not moving ext HEENT: ett, jvd wnl PULM: CTA anterior CV: s1 s2 RRR GI: soft, slight obese, NT, nd Extremities: edema increased     LABS:  BMET  Recent Labs Lab 12/26/16 0414 12/27/16 0430 12/28/16 0342  NA 136 138 139  K 3.5 4.1 3.9  CL 110 113* 111  CO2 18* 19* 22  BUN 10 16 21*  CREATININE 0.56 0.60 0.58  GLUCOSE 217* 203* 189*    Electrolytes  Recent Labs Lab 12/26/16 0414  12/27/16 0430 12/27/16 1902 12/28/16 0342  CALCIUM 7.6*  --  8.1*  --  8.1*  MG 1.7  < > 2.5* 2.3 2.2  PHOS 1.8*  < > 1.9* 2.2* 2.0*  < > = values in this interval not displayed.  CBC  Recent Labs Lab 12/26/16 0414 12/27/16 0430 12/28/16 0342  WBC 22.5* 23.6* 20.6*  HGB 11.8* 11.3* 11.4*  HCT 36.4 34.5* 35.8*  PLT 259 262 251    Coag's  Recent Labs Lab 12/25/16 0403 12/25/16 0944  APTT 30 32  INR 1.04 1.08    Sepsis  Markers No results for input(s): LATICACIDVEN, PROCALCITON, O2SATVEN in the last 168 hours.  ABG  Recent Labs Lab 12/25/16 0703 12/25/16 2034 12/27/16 0408  PHART 7.353 7.269* 7.343*  PCO2ART 36.1 43.7 34.2  PO2ART 501.0* 237.0* 90.0    Liver Enzymes  Recent Labs Lab 12/25/16 0403 12/28/16 0342  AST 20 46*  ALT 15 51  ALKPHOS 70 48  BILITOT 0.6 0.5  ALBUMIN 3.8 3.2*    Cardiac Enzymes No results for input(s): TROPONINI, PROBNP in the last 168 hours.  Glucose  Recent Labs Lab 12/27/16 1145 12/27/16 1610 12/27/16 1917 12/27/16 2310 12/28/16 0319 12/28/16 0745  GLUCAP 186* 172* 154* 167* 178* 137*    Imaging Dg Chest Port 1 View  Result Date: 12/28/2016 CLINICAL DATA:  Acute respiratory failure EXAM: PORTABLE CHEST 1 VIEW COMPARISON:  12/27/2016 FINDINGS: Support devices are stable. Mild cardiomegaly. Low lung volumes with left base atelectasis. No effusions. No acute bony abnormality. IMPRESSION: Cardiomegaly.  Left base atelectasis with low lung volumes. Electronically Signed   By: Rolm Baptise M.D.   On: 12/28/2016 07:18     STUDIES:  CT Head 5/22 > Diffuse subarachnoid hemorrhage, filling CSF spaces in the suprasellar and basal  cisterns as well as bilateral sulci. 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 Aneurysm is directed posteriorly, and demonstrates a fairly narrow neck. Neuro endovascular consultation is recommended. 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 More confluent acute infarcts in the high frontoparietal regions bilaterally. 2. Subarachnoid and small volume intraventricular hemorrhage. 3. Mild cervical spondylosis.  No  spinal stenosis. 4. Unremarkable appearance of the cervical spinal cord.  CULTURES: Csf 5/23>>>   ANTIBIOTICS: CTX 5/23>>> vanc 5/23>>>  SIGNIFICANT EVENTS: 5/22 > SAH, coil 5/23- not moving ext 5/24 LP  LINES/TUBES: ETT 5/22 >> 5/22 rt IJ>>>  DISCUSSION: 41 year old female presents with N/V and Confusion. 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 P:  Cardiac Monitoring  BP Goals met sems High risk vasospasm starting today, keep pos balance Echo now for clot, pending, will follow  RENAL A:   Hypokalemia  Mild improved High risk vasospasm P:   Chem in am  Pos balance goals 1 liters remains  GASTROINTESTINAL A:   Dysphagia presumed P:   NPO PPI T F to goal met  HEMATOLOGIC A:   SAH Mild anemia leukocytisis P:  scd coags wnl  INFECTIOUS A:   Leukocytosis slow resolving R/o endocarditis R/o mycotic Aneurysm LP unimpressive infectious source of any kind, appears as SAH P:   Trend Fever curve Empiric CTX, vanc, will dc vanc for now Echo pending BC to follow Rpr, hiv neg, follow cultures csf  ENDOCRINE A:   DM    P:    SSI cbg in NICE  NEUROLOGIC A:   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  FAMILY  - Updates: Brother updated at bedside by me daily  -  Inter-disciplinary family meet or Palliative Care meeting due by:  5/29   Ccm time 30 min   Lavon Paganini. Titus Mould, MD, [place] Pgr: [facility]

## 2016-12-28 NOTE — Progress Notes (Signed)
STROKE TEAM PROGRESS NOTE   SUBJECTIVE (INTERVAL HISTORY) No family is at the bedside.  Pt is still intubated and quadriplegia. 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 No sign of infection. TCD today showed increased velocity at right MCA and ACA.     OBJECTIVE Temp:  [98.5 F (36.9 C)-100.1 F (37.8 C)] 100.1 F (37.8 C) (05/25 0400) Pulse Rate:  [65-115] 79 (05/25 0800) Cardiac Rhythm: Normal sinus rhythm (05/25 0800) Resp:  [11-23] 14 (05/25 0800) BP: (117-160)/(74-107) 148/89 (05/25 0800) SpO2:  [97 %-100 %] 100 % (05/25 0800) FiO2 (%):  [30 %] 30 % (05/25 0330) Weight:  [96 kg (211 lb 10.3 oz)] 96 kg (211 lb 10.3 oz) (05/25 0418)  CBC:   Recent Labs Lab 12/27/16 0430 12/28/16 0342  WBC 23.6* 20.6*  NEUTROABS 22.2* 18.8*  HGB 11.3* 11.4*  HCT 34.5* 35.8*  MCV 86.0 86.3  PLT 262 119    Basic Metabolic Panel:   Recent Labs Lab 12/27/16 0430 12/27/16 1902 12/28/16 0342  NA 138  --  139  K 4.1  --  3.9  CL 113*  --  111  CO2 19*  --  22  GLUCOSE 203*  --  189*  BUN 16  --  21*  CREATININE 0.60  --  0.58  CALCIUM 8.1*  --  8.1*  MG 2.5* 2.3 2.2  PHOS 1.9* 2.2* 2.0*    Lipid Panel:     Component Value Date/Time   TRIG 209 (H) 12/28/2016 0625   HgbA1c: No results found for: HGBA1C Urine Drug Screen:     Component Value Date/Time   LABOPIA NONE DETECTED 12/25/2016 0913   COCAINSCRNUR NONE DETECTED 12/25/2016 0913   LABBENZ POSITIVE (A) 12/25/2016 0913   AMPHETMU NONE DETECTED 12/25/2016 0913   THCU NONE DETECTED 12/25/2016 0913   LABBARB NONE DETECTED 12/25/2016 0913    Alcohol Level No results found for: [place] I have personally reviewed the radiological images below and agree with the radiology interpretations.  Ct Angio Head W Or Wo Contrast Ct Angio Neck W And/or Wo Contrast 12/25/2016  CTA NECK IMPRESSION:  Normal CTA of the neck.   CTA HEAD IMPRESSION:  1. 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 Aneurysm is  directed posteriorly, and demonstrates a fairly narrow neck. Neuro endovascular consultation is recommended.  2. Diffuse irregularity throughout the remaining intracranial arterial vasculature, suspicious for vasospasm secondary to acute subarachnoid hemorrhage.    Cerbral Angiogram / Coiling - Dr Kathyrn Sheriff 12/25/2016 1. 7.5 x 6.71mm right Pcom aneurysm projecting posteriorly, coiled successfully without aneurysm residual. 2. No significant vasospasm seen 3. No other intracranial aneurysms, AVM, or fistulas seen   Ct Head Wo Contrast 12/25/2016 Diffuse subarachnoid hemorrhage, filling CSF spaces in the suprasellar and basal cisterns as well as bilateral sulci. Cause is not determined but presents high suspicion for ruptured intracranial aneurysm.   TTE - Left ventricle: The cavity size was normal. Systolic function was   normal. The estimated ejection fraction was in the range of 55%   to 60%. Wall motion was normal; there were no regional wall   motion abnormalities. Doppler parameters are consistent with   abnormal left ventricular relaxation (grade 1 diastolic   dysfunction). Acoustic contrast opacification revealed no   evidence ofthrombus.  TCD 12/26/16 - no evidence of vasospasm  TCD 12/28/16 - elevated velocity at right MCA and ACA.  Mr Brain and C-spine Wo Contrast 12/26/2016 IMPRESSION: 1. Scattered acute embolic infarcts in  both cerebral hemispheres and left cerebellum. More confluent acute infarcts in the high frontoparietal regions bilaterally. 2. Subarachnoid and small volume intraventricular hemorrhage. 3. Mild cervical spondylosis.  No spinal stenosis. 4. Unremarkable appearance of the cervical spinal cord.    PHYSICAL EXAM  Temp:  [98.5 F (36.9 C)-100.1 F (37.8 C)] 100.1 F (37.8 C) (05/25 0400) Pulse Rate:  [65-115] 79 (05/25 0800) Resp:  [11-23] 14 (05/25 0800) BP: (117-160)/(74-107) 148/89 (05/25 0800) SpO2:  [97 %-100 %] 100 % (05/25 0800) FiO2 (%):  [30 %]  30 % (05/25 0330) Weight:  [96 kg (211 lb 10.3 oz)] 96 kg (211 lb 10.3 oz) (05/25 0418)  General - Well nourished, well developed, intubated.  Ophthalmologic - Fundi not visualized due to noncooperation.  Cardiovascular - Regular rate and rhythm.  Neuro - intubated on fentanyl and propofol, eyes closed but able to open with repetitive stimulation. PERRL, able to attend both sides, right gaze preference, mild left gaze palsy with nystagmus on left gaze, positive corneal and gag. Did not move right hand today and no movement at other limbs with pain stimulation. DTR diminished and no babinski. Sensation and coordination not cooperative. Gait not tested.    ASSESSMENT/PLAN [name] Plan is a 41 y.o. female with history of hypertension and diabetes mellitus presenting with tetraplegia and altered mental status. She did not receive IV t-PA due to subarachnoid hemorrhage.  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  CTA neck - normal  Cerebral angio - no vasospasm.  TCD 12/28/16 increased velocity at right MCA and ACA  2D Echo - EF 55-60%  VTE prophylaxis - SCDs Diet NPO time specified  No antithrombotic prior to admission, now on No antithrombotic secondary to [place].  S/p right PCOM coiling by Dr. 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  LP - Xanthochromia. No sign of infection  Still no good explanation of quadriplegia - may related to "brainstem shock" due to concentrated basal  cistern blood product - expect pt symptom will improve over time if this is the case  Recommend to repeat MRI brain and C-spine (include DWI) as well as MRA on Sunday to evaluate any brainstem infarct or cerebral vasospasm  Hypertension  Stable - IV Cardene  BP goal < 160  Long-term BP goal normotensive  Other Stroke Risk Factors  Obesity, Body mass index is 36.33 kg/m., recommend weight loss, diet and exercise as appropriate   Other Active Problems  Hypokalemia - 2.9 -> 3.9 resolved  Leukocytosis - 16.1 -> 23.6 -> 20.6 (temp 99.4 ax)  Hospital day # 3  This patient is critically ill due to diffuse basal SAH, ruptured PCOM aneurysm, HTN, quadriplegia and at significant risk of neurological worsening, death form recurrent SAH, vasospasm, hydrocephalus, seizure. This patient's care requires constant monitoring of vital signs, hemodynamics, respiratory and cardiac monitoring, review of multiple databases, neurological assessment, discussion with family, other specialists and medical decision making of high complexity. I spent 35 minutes of neurocritical care time in the care of  this patient.   Rosalin Hawking, MD PhD Stroke Neurology 12/28/2016 2:38 PM  To contact Stroke Continuity provider, please refer to [email or web address] After hours, contact General Neurology

## 2016-12-28 NOTE — Progress Notes (Signed)
PT Cancellation Note  Patient Details Name: Aimee [name] Plan MRN: [id number] DOB: 10-03-1975   Cancelled Treatment:    Reason Eval/Treat Not Completed: Patient not medically ready Pt on bedrest. Will await increase in activity orders prior to PT evaluation. thanks   [place] 12/28/2016, 8:52 AM  Wray Kearns, PT, DPT [phone]

## 2016-12-28 NOTE — Progress Notes (Signed)
SLP Cancellation Note  Patient Details Name: Aimee [name] MRN: [id number] DOB: 11-30-1975   Cancelled treatment:       Reason Eval/Treat Not Completed: Medical issues which prohibited therapy. Pt remains intubated this morning but with possible extubation today per RN. Will f/u as able.   Germain Osgood 12/28/2016, 9:49 AM  Germain Osgood, M.A. CCC-SLP [phone]

## 2016-12-28 NOTE — Progress Notes (Signed)
  Echocardiogram 2D Echocardiogram has been performed.  Aimee [name] 12/28/2016, 9:47 AM

## 2016-12-28 NOTE — Progress Notes (Signed)
Pharmacy Antibiotic Note  [name] Plan is a 41 y.o. female admitted on 12/25/2016 with [facility] and aneurysm, now w/ concern for possible endocarditis or mycotic aneurysm. CSF fluid unimpressive for meningitis. Blood cultures are negative to day.  Pharmacy has been consulted for vancomycin dosing.   Vancomycin trough = 10 on 1g IV every 8 hours, prior to 5th dose.  Noted new quadriparesis. SCR 0.58 with estimated CrCl >100 mL/min.   Plan: Increase Vancomycin to 1500mg  IV every 8 hours for goal trough of 15-20 mcg/mL.   Height: 5\' 4"  (162.6 cm) Weight: 211 lb 10.3 oz (96 kg) IBW/kg (Calculated) : 54.7  Temp (24hrs), Avg:99.1 F (37.3 C), Min:98.5 F (36.9 C), Max:100.1 F (37.8 C)   Recent Labs Lab 12/25/16 0403 12/25/16 0427 12/25/16 0944 12/25/16 1552 12/26/16 0414 12/27/16 0430 12/28/16 0342 12/28/16 1539  WBC 16.1*  --  22.6*  --  22.5* 23.6* 20.6*  --   CREATININE 0.70 0.50  --  0.66 0.56 0.60 0.58  --   VANCOTROUGH  --   --   --   --   --   --   --  10*    Estimated Creatinine Clearance: 104 mL/min (by C-G formula based on SCr of 0.58 mg/dL).    No Known Allergies   Thank you for allowing pharmacy to be a part of this patient's care.  Sloan Leiter, PharmD, BCPS Clinical Pharmacist Clinical Phone 12/28/2016 until 11 PM- @ [phone] After hours, please call #[phone] 12/28/2016 4:40 PM

## 2016-12-28 NOTE — Progress Notes (Signed)
Bedside EEG completed, results pending. 

## 2016-12-28 NOTE — Progress Notes (Signed)
Pt seen and examined. No issues overnight.   EXAM: Temp:  [98.5 F (36.9 C)-100.1 F (37.8 C)] 99.4 F (37.4 C) (05/25 0800) Pulse Rate:  [65-115] 84 (05/25 1100) Resp:  [11-23] 17 (05/25 1100) BP: (125-167)/(74-107) 125/81 (05/25 1000) SpO2:  [97 %-100 %] 100 % (05/25 1100) FiO2 (%):  [30 %] 30 % (05/25 0820) Weight:  [96 kg (211 lb 10.3 oz)] 96 kg (211 lb 10.3 oz) (05/25 0418) Intake/Output      05/24 0701 - 05/25 0700 05/25 0701 - 05/26 0700   I.V. (mL/kg) 2248.5 (23.4) 496.5 (5.2)   NG/GT 825 125   IV Piggyback 956 560   Total Intake(mL/kg) 4029.5 (42) 1181.5 (12.3)   Urine (mL/kg/hr) 2225 (1) 650 (1.6)   Stool  0 (0)   Total Output 2225 650   Net +1804.5 +531.5        Stool Occurrence  1 x    Currently sedated after echo, getting EEG Per RN prior to increasing sedation, Awake, follows commands No spontaneous movements BUE/BLE  LABS: Lab Results  Component Value Date   CREATININE 0.58 12/28/2016   BUN 21 (H) 12/28/2016   NA 139 12/28/2016   K 3.9 12/28/2016   CL 111 12/28/2016   CO2 22 12/28/2016   Lab Results  Component Value Date   WBC 20.6 (H) 12/28/2016   HGB 11.4 (L) 12/28/2016   HCT 35.8 (L) 12/28/2016   MCV 86.3 12/28/2016   PLT 251 12/28/2016     IMPRESSION: - 41 y.o. female [place] d# 4 s/p right Pcom coiling - densely quadriparetic for unclear reason - ? "stunned" brainstem. Infectious w/u negative thus far, CSF largely unremarkable - VDRF  PLAN: - Cont supportive care - Will plan on repeat MRI brain/Cspine this weekend

## 2016-12-28 NOTE — Progress Notes (Signed)
Transcranial Doppler  Date POD PCO2 HCT BP  MCA ACA PCA OPHT SIPH VERT Basilar  5/23 rs     Right  Left   76  44   -52  -48   42  31   27  41   29  48   *  -37   -35      5/25 je     Right  Left   103  118   -95  -94   58  49   37  48   51  72   -35  *   -34           Right  Left                                             Right  Left                                             Right  Left                                            Right  Left                                            Right  Left                                        MCA = Middle Cerebral Artery      OPHT = Opthalmic Artery     BASILAR = Basilar Artery   ACA = Anterior Cerebral Artery     SIPH = Carotid Siphon PCA = Posterior Cerebral Artery   VERT = Verterbral Artery                   Normal MCA = 62+\-12 ACA = 50+\-12 PCA = 42+\-23   12/26/16 - * not insonated rds 5/25 [place] ratio: right = 2.8 left = 3.2 JE

## 2016-12-28 NOTE — Procedures (Signed)
HPI:  41 y/o with SAH, with MS change  TECHNICAL SUMMARY:  A multichannel referential and bipolar montage EEG using the standard international 10-20 system was performed on the patient described as unresponsive.  The patient is sedated on propofol during the EEG.  There is no occipital dominant rhythm.  At a maximum, there is 5-1/2 Hz rhythm that is noted posteriorly.  2-4 Hz activity can be seen intermixed in all head regions.    ACTIVATION:  Stepwise photic stimulation and hyperventilation are not performed.  EPILEPTIFORM ACTIVITY:  There were no spikes, sharp waves or paroxysmal activity.  SLEEP:  No sleep is noted  CARDIAC:  The EKG lead revealed a regular sinus rhythm.  IMPRESSION:  This EEG demonstrated no focal, hemispheric, or lateralizing features.  There was severe slowing of electrocerebral activity which can be seen in a wide variety of encephalopathic state including those of a toxic, metabolic, or degenerative nature.  This can also be a result of sedating medication.  Correlate clinically.

## 2016-12-29 ENCOUNTER — Inpatient Hospital Stay (HOSPITAL_COMMUNITY): Payer: Medicaid Other

## 2016-12-29 DIAGNOSIS — J9601 Acute respiratory failure with hypoxia: Secondary | ICD-10-CM

## 2016-12-29 LAB — BASIC METABOLIC PANEL WITH GFR
Anion gap: 6 (ref 5–15)
BUN: 25 mg/dL — ABNORMAL HIGH (ref 6–20)
CO2: 25 mmol/L (ref 22–32)
Calcium: 8 mg/dL — ABNORMAL LOW (ref 8.9–10.3)
Chloride: 110 mmol/L (ref 101–111)
Creatinine, Ser: 0.6 mg/dL (ref 0.44–1.00)
GFR calc Af Amer: >60 mL/min (ref >60)
GFR calc non Af Amer: >60 mL/min (ref >60)
Glucose, Bld: 168 mg/dL — ABNORMAL HIGH (ref 65–99)
Potassium: 4.1 mmol/L (ref 3.5–5.1)
Sodium: 141 mmol/L (ref 135–145)

## 2016-12-29 LAB — CBC WITH DIFFERENTIAL/PLATELET
Basophils Absolute: 0 10*3/uL (ref 0.0–0.1)
Basophils Relative: 0 %
Eosinophils Absolute: 0 10*3/uL (ref 0.0–0.7)
Eosinophils Relative: 0 %
HCT: 37.6 % (ref 36.0–46.0)
Hemoglobin: 11.9 g/dL — ABNORMAL LOW (ref 12.0–15.0)
Lymphocytes Relative: 6 %
Lymphs Abs: 1.1 10*3/uL (ref 0.7–4.0)
MCH: 27.5 pg (ref 26.0–34.0)
MCHC: 31.6 g/dL (ref 30.0–36.0)
MCV: 87 fL (ref 78.0–100.0)
Monocytes Absolute: 1.1 10*3/uL — ABNORMAL HIGH (ref 0.1–1.0)
Monocytes Relative: 6 %
Neutro Abs: 16.3 10*3/uL — ABNORMAL HIGH (ref 1.7–7.7)
Neutrophils Relative %: 88 %
Platelets: 269 10*3/uL (ref 150–400)
RBC: 4.32 MIL/uL (ref 3.87–5.11)
RDW: 16.2 % — ABNORMAL HIGH (ref 11.5–15.5)
WBC: 18.5 10*3/uL — ABNORMAL HIGH (ref 4.0–10.5)

## 2016-12-29 LAB — POCT I-STAT 3, ART BLOOD GAS (G3+)
Acid-base deficit: 1 mmol/L (ref 0.0–2.0)
Bicarbonate: 25.2 mmol/L (ref 20.0–28.0)
O2 Saturation: 95 %
Patient temperature: 98.9
TCO2: 27 mmol/L (ref 0–100)
pCO2 arterial: 48.8 mmHg — ABNORMAL HIGH (ref 32.0–48.0)
pH, Arterial: 7.322 — ABNORMAL LOW (ref 7.350–7.450)
pO2, Arterial: 83 mmHg (ref 83.0–108.0)

## 2016-12-29 LAB — GLUCOSE, CAPILLARY
Glucose-Capillary: 130 mg/dL — ABNORMAL HIGH (ref 65–99)
Glucose-Capillary: 131 mg/dL — ABNORMAL HIGH (ref 65–99)
Glucose-Capillary: 148 mg/dL — ABNORMAL HIGH (ref 65–99)
Glucose-Capillary: 152 mg/dL — ABNORMAL HIGH (ref 65–99)
Glucose-Capillary: 157 mg/dL — ABNORMAL HIGH (ref 65–99)
Glucose-Capillary: 157 mg/dL — ABNORMAL HIGH (ref 65–99)
Glucose-Capillary: 160 mg/dL — ABNORMAL HIGH (ref 65–99)

## 2016-12-29 NOTE — H&P (Signed)
PULMONARY / CRITICAL CARE MEDICINE   Name: [name] Plan MRN: [id number] DOB: Oct 15, 1975    ADMISSION DATE:  12/25/2016 CONSULTATION DATE:  12/25/2016  REFERRING MD:  Dr. Dina Rich, EDP  CHIEF COMPLAINT:  AMS  HISTORY OF PRESENT ILLNESS: 41 , SAH, coil, vent  SUBJECTIVE No events overnight, MRI in AM  VITAL SIGNS: BP (!) 156/101   Pulse 89   Temp 99.4 F (37.4 C) (Oral)   Resp (!) 22   Ht '5\' 4"'  (1.626 m)   Wt 98.1 kg (216 lb 4.3 oz)   LMP  (LMP Unknown) Comment: vented pt sah  SpO2 99%   BMI 37.12 kg/m   HEMODYNAMICS:    VENTILATOR SETTINGS: Vent Mode: PCV FiO2 (%):  [30 %] 30 % Set Rate:  [18 bmp] 18 bmp Vt Set:  [430 mL] 430 mL PEEP:  [5 cmH20] 5 cmH20 Plateau Pressure:  [13 cmH20-16 cmH20] 16 cmH20  INTAKE / OUTPUT: I/O last 3 completed shifts: In: 7304.9 [I.V.:4129.9; NG/GT:1260; IV Piggyback:1915] Out: 3700 [Urine:2625; Emesis/NG output:1075]  PHYSICAL EXAMINATION: General:  Awake, fc less, vent Neuro: perrl, prop back on, not moving ext HEENT: [place]/AT, PERRL, EOM-I and MMM PULM: CTA bilaterally CV: RRR, Nl S1/S2, -M/R/G GI: soft, slight obese, NT, nd Extremities: edema increased  LABS:  BMET  Recent Labs Lab 12/27/16 0430 12/28/16 0342 12/29/16 0430  NA 138 139 141  K 4.1 3.9 4.1  CL 113* 111 110  CO2 19* 22 25  BUN 16 21* 25*  CREATININE 0.60 0.58 0.60  GLUCOSE 203* 189* 168*   Electrolytes  Recent Labs Lab 12/27/16 0430 12/27/16 1902 12/28/16 0342 12/29/16 0430  CALCIUM 8.1*  --  8.1* 8.0*  MG 2.5* 2.3 2.2  --   PHOS 1.9* 2.2* 2.0*  --    CBC  Recent Labs Lab 12/27/16 0430 12/28/16 0342 12/29/16 0430  WBC 23.6* 20.6* 18.5*  HGB 11.3* 11.4* 11.9*  HCT 34.5* 35.8* 37.6  PLT 262 251 269   Coag's  Recent Labs Lab 12/25/16 0403 12/25/16 0944  APTT 30 32  INR 1.04 1.08   Sepsis Markers No results for input(s): LATICACIDVEN, PROCALCITON, O2SATVEN in the last 168 hours.  ABG  Recent Labs Lab 12/25/16 2034  12/27/16 0408 12/29/16 0144  PHART 7.269* 7.343* 7.322*  PCO2ART 43.7 34.2 48.8*  PO2ART 237.0* 90.0 83.0   Liver Enzymes  Recent Labs Lab 12/25/16 0403 12/28/16 0342  AST 20 46*  ALT 15 51  ALKPHOS 70 48  BILITOT 0.6 0.5  ALBUMIN 3.8 3.2*   Cardiac Enzymes No results for input(s): TROPONINI, PROBNP in the last 168 hours.  Glucose  Recent Labs Lab 12/28/16 1149 12/28/16 1536 12/28/16 2003 12/28/16 2331 12/29/16 0425 12/29/16 0714  GLUCAP 185* 145* 146* 160* 157* 130*   Imaging Dg Chest Port 1 View  Result Date: 12/29/2016 CLINICAL DATA:  Admitted for subarachnoid hemorrhage and aneurysm, suspect endocarditis or mycotic aneurysm. Respiratory distress. EXAM: PORTABLE CHEST 1 VIEW COMPARISON:  Chest radiograph Dec 28, 2016 FINDINGS: Endotracheal tube projects 2.1 cm above the carina. RIGHT internal jugular central venous catheter distal tip projects the cavoatrial junction. Nasogastric tube side port in region of proximal duodenum, distal tip not included in field of few. Cardiomediastinal silhouette is unremarkable for this low inspiratory examination with crowded vasculature markings. The lungs are clear without pleural effusions or focal consolidations. Trachea projects midline and there is no pneumothorax. Included soft tissue planes and osseous structures are non-suspicious. IMPRESSION: Nasogastric tube advancement, otherwise relatively stable  appearance of life-support lines. No acute cardiopulmonary process for this low inspiratory portable examination. Electronically Signed   By: Elon Alas M.D.   On: 12/29/2016 01:26   Dg Abd Portable 1v  Result Date: 12/28/2016 CLINICAL DATA:  OG tube placement. EXAM: PORTABLE ABDOMEN - 1 VIEW COMPARISON:  Chest x-ray 12/25/2016 . FINDINGS: A G-tube noted since tip projected over the duodenum. No gastric distention. No free air. IMPRESSION: OG tube noted with its tip projected over the duodenum. Electronically Signed   By:  Marcello Moores  Register   On: 12/28/2016 13:49   STUDIES:  CT Head 5/22 > Diffuse subarachnoid hemorrhage, filling CSF spaces in the suprasellar and basal cisterns as well as bilateral sulci. 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 Aneurysm is directed posteriorly, and demonstrates a fairly narrow neck. Neuro endovascular consultation is recommended. 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 More confluent acute infarcts in the high frontoparietal regions bilaterally. 2. Subarachnoid and small volume intraventricular hemorrhage. 3. Mild cervical spondylosis.  No spinal stenosis. 4. Unremarkable appearance of the cervical spinal cord.  CULTURES: Csf 5/23>>>   ANTIBIOTICS: CTX 5/23>>> Vanc 5/23>>>  SIGNIFICANT EVENTS: 5/22 > SAH, coil 5/23- not moving ext 5/24 LP  LINES/TUBES: ETT 5/22 >> 5/22 rt IJ>>>  DISCUSSION: 41 year old female presents with N/V and Confusion. 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 P:  Cardiac Monitoring  BP Goals met High risk vasospasm starting today, keep pos balance, will defer to neuro Echo now for clot, pending, will follow  RENAL A:   Hypokalemia  Mild improved High risk vasospasm P:   Chem in am  Pos  balance goals 1 liters remains  GASTROINTESTINAL A:   Dysphagia presumed P:   NPO PPI TF per neurology  HEMATOLOGIC A:   SAH Mild anemia leukocytisis P:  SCD Coags WNL  INFECTIOUS A:   Leukocytosis slow resolving R/o endocarditis R/o mycotic Aneurysm LP unimpressive infectious source of any kind, appears as SAH P:   Trend Fever curve Empiric CTX, vanc, will dc vanc for now Echo pending BC pending  ENDOCRINE A:   DM    P:    SSI CBG  NEUROLOGIC A:   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  limited ext movement not clear Emboli noted P:   RASS goal: -1/-2, prop needed Neurology following  Neurosurgery consult appreciated MRI per neuro Get EEG TCD pos balance needed May need repeat there LP in a few days pending mental status  FAMILY  - Updates: Husband updated bedside  - Inter-disciplinary family meet or Palliative Care meeting due by:  5/29  The patient is critically ill with multiple organ systems failure and requires high complexity decision making for assessment and support, frequent evaluation and titration of therapies, application of advanced monitoring technologies and extensive interpretation of multiple  databases.   Critical Care Time devoted to patient care services described in this note is  35  Minutes. This time reflects time of care of this signee Dr Jennet Maduro. This critical care time does not reflect procedure time, or teaching time or supervisory time of PA/NP/Med student/Med Resident etc but could involve care discussion time.  Rush Farmer, M.D. [facility] Pulmonary/Critical Care Medicine. Pager: [phone]. After hours pager: [phone].

## 2016-12-29 NOTE — Progress Notes (Signed)
STROKE TEAM PROGRESS NOTE   SUBJECTIVE (INTERVAL HISTORY) Husband is at bedside. Repeat MRI and MRA pending tomorrow. Pt still intubated, cannot decrease propofol due to agitation.TCD showed increased velocity at right MCA and ACA.     OBJECTIVE Temp:  [98.7 F (37.1 C)-100.2 F (37.9 C)] 99.4 F (37.4 C) (05/26 0724) Pulse Rate:  [59-95] 75 (05/26 0800) Cardiac Rhythm: Normal sinus rhythm (05/26 0800) Resp:  [8-28] 10 (05/26 0800) BP: (110-167)/(75-108) 156/101 (05/26 0800) SpO2:  [96 %-100 %] 98 % (05/26 0800) FiO2 (%):  [30 %] 30 % (05/26 0341) Weight:  [98.1 kg (216 lb 4.3 oz)] 98.1 kg (216 lb 4.3 oz) (05/26 0353)  CBC:   Recent Labs Lab 12/28/16 0342 12/29/16 0430  WBC 20.6* 18.5*  NEUTROABS 18.8* 16.3*  HGB 11.4* 11.9*  HCT 35.8* 37.6  MCV 86.3 87.0  PLT 251 509    Basic Metabolic Panel:   Recent Labs Lab 12/27/16 1902 12/28/16 0342 12/29/16 0430  NA  --  139 141  K  --  3.9 4.1  CL  --  111 110  CO2  --  22 25  GLUCOSE  --  189* 168*  BUN  --  21* 25*  CREATININE  --  0.58 0.60  CALCIUM  --  8.1* 8.0*  MG 2.3 2.2  --   PHOS 2.2* 2.0*  --     Lipid Panel:     Component Value Date/Time   TRIG 209 (H) 12/28/2016 0625   HgbA1c: No results found for: HGBA1C Urine Drug Screen:     Component Value Date/Time   LABOPIA NONE DETECTED 12/25/2016 0913   COCAINSCRNUR NONE DETECTED 12/25/2016 0913   LABBENZ POSITIVE (A) 12/25/2016 0913   AMPHETMU NONE DETECTED 12/25/2016 0913   THCU NONE DETECTED 12/25/2016 0913   LABBARB NONE DETECTED 12/25/2016 0913    Alcohol Level No results found for: [place] I have personally reviewed the radiological images below and agree with the radiology interpretations.  Ct Angio Head W Or Wo Contrast Ct Angio Neck W And/or Wo Contrast 12/25/2016  CTA NECK IMPRESSION:  Normal CTA of the neck.   CTA HEAD IMPRESSION:  1. 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 Aneurysm is directed posteriorly,  and demonstrates a fairly narrow neck. Neuro endovascular consultation is recommended.  2. Diffuse irregularity throughout the remaining intracranial arterial vasculature, suspicious for vasospasm secondary to acute subarachnoid hemorrhage.    Cerbral Angiogram / Coiling - Dr Kathyrn Sheriff 12/25/2016 1. 7.5 x 6.57mm right Pcom aneurysm projecting posteriorly, coiled successfully without aneurysm residual. 2. No significant vasospasm seen 3. No other intracranial aneurysms, AVM, or fistulas seen   Ct Head Wo Contrast 12/25/2016 Diffuse subarachnoid hemorrhage, filling CSF spaces in the suprasellar and basal cisterns as well as bilateral sulci. Cause is not determined but presents high suspicion for ruptured intracranial aneurysm.   TTE - Left ventricle: The cavity size was normal. Systolic function was   normal. The estimated ejection fraction was in the range of 55%   to 60%. Wall motion was normal; there were no regional wall   motion abnormalities. Doppler parameters are consistent with   abnormal left ventricular relaxation (grade 1 diastolic   dysfunction). Acoustic contrast opacification revealed no   evidence ofthrombus.  TCD 12/26/16 - no evidence of vasospasm  TCD 12/28/16 - elevated velocity at right MCA and ACA.  Mr Brain and C-spine Wo Contrast 12/26/2016 IMPRESSION: 1. Scattered acute embolic infarcts in both  cerebral hemispheres and left cerebellum. More confluent acute infarcts in the high frontoparietal regions bilaterally. 2. Subarachnoid and small volume intraventricular hemorrhage. 3. Mild cervical spondylosis.  No spinal stenosis. 4. Unremarkable appearance of the cervical spinal cord.    PHYSICAL EXAM  Temp:  [98.7 F (37.1 C)-100.2 F (37.9 C)] 99.4 F (37.4 C) (05/26 0724) Pulse Rate:  [59-95] 75 (05/26 0800) Resp:  [8-28] 10 (05/26 0800) BP: (110-167)/(75-108) 156/101 (05/26 0800) SpO2:  [96 %-100 %] 98 % (05/26 0800) FiO2 (%):  [30 %] 30 % (05/26 0341) Weight:   [98.1 kg (216 lb 4.3 oz)] 98.1 kg (216 lb 4.3 oz) (05/26 0353)  General - Well nourished, well developed, intubated and sedated. Cannot decrease propofol due to agitation.   Ophthalmologic - Fundi not visualized due to noncooperation.  Cardiovascular - Regular rate and rhythm.  Neuro - intubated on fentanyl and propofol, eyes closed and does not open. Pupils pinpoint but reactive. right gaze preference, positive corneal and gag. No movement on stim x 4 limbs.  DTR diminished and equivocal toes. Sensation and coordination not cooperative. Gait not tested.    ASSESSMENT/PLAN Ms. [name] Plan is a 41 y.o. female with history of hypertension and diabetes mellitus presenting with tetraplegia and altered mental status. She did not receive IV t-PA due to subarachnoid hemorrhage.  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  CTA neck - normal  Cerebral angio - no vasospasm.  TCD 12/28/16 increased velocity at right MCA and ACA  2D Echo - EF 55-60%  VTE prophylaxis - SCDs Diet NPO time specified  No antithrombotic prior to admission, now on No antithrombotic secondary to [place].  S/p right PCOM coiling by Dr. 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  LP - Xanthochromia. No sign of infection  Still no good explanation of quadriplegia - may related to "brainstem shock" due to concentrated basal cistern blood product - expect pt symptom will improve over time if this is the case  Recommend  to repeat MRI brain and C-spine (include DWI) as well as MRA Sunday evening to evaluate any brainstem infarct or cerebral vasospasm  Hypertension  Stable - IV Cardene  BP goal < 160  Long-term BP goal normotensive  Other Stroke Risk Factors  Obesity, Body mass index is 37.12 kg/m., recommend weight loss, diet and exercise as appropriate   Other Active Problems  Leukocytosis - 16.1 -> 23.6 -> 20.6 -> 18.5 (temp 99.4)  IV Rocephin / vancomycin   PLAN  Consider repeat MRI brain and C-spine (include DWI) as well as MRA on Sunday Evening  to evaluate any brainstem infarct or cerebral vasospasm - as per Dr Erlinda Hong -> Will defer to Dr. Kathyrn Sheriff.  Hospital day # 4  This patient is critically ill due to diffuse basal SAH, ruptured PCOM aneurysm, HTN, quadriplegia and at significant risk of neurological worsening, death form recurrent SAH, vasospasm, hydrocephalus, seizure. This patient's care requires constant monitoring of vital signs, hemodynamics, respiratory and cardiac monitoring, review of multiple databases, neurological  assessment, discussion with family, other specialists and medical decision making of high complexity. I spent 30 minutes of neurocritical care time in the care of this patient.    To contact Stroke Continuity provider, please refer to [email or web address] After hours, contact General Neurology

## 2016-12-29 NOTE — Progress Notes (Signed)
SLP Cancellation Note  Patient Details Name: Aimee [name] MRN: [id number] DOB: 09-26-1975   Cancelled treatment:       Reason Eval/Treat Not Completed: Medical issues which prohibited therapy. Pt remains intubated. SLP will s/o. Please reorder when medically ready.  Deneise Lever, [place], [place] Speech-Language Pathologist [phone]   [name] 12/29/2016, 12:02 PM

## 2016-12-29 NOTE — Progress Notes (Signed)
Patient sedated at this time. Due to sedation parameters not done at this time.

## 2016-12-29 NOTE — Progress Notes (Signed)
Subjective: Patient continues on ventilator via ETT. Sedated with propofol and fentanyl.  Objective: Vital signs in last 24 hours: Vitals:   12/29/16 0640 12/29/16 0700 12/29/16 0724 12/29/16 0800  BP: (!) 154/101 136/87  (!) 156/101  Pulse: 74 77  75  Resp: 10 (!) 8  10  Temp:   99.4 F (37.4 C)   TempSrc:   Oral   SpO2: 100% 99%  98%  Weight:      Height:        Intake/Output from previous day: 05/25 0701 - 05/26 0700 In: 4998.5 [I.V.:2658.5; NG/GT:625; IV Piggyback:1715] Out: 9784 [Urine:1350; Emesis/NG RQSXQK:2081] Intake/Output this shift: Total I/O In: 197.9 [I.V.:122.9; NG/GT:25; IV Piggyback:50] Out: -   Physical Exam:  Pupils 2 mm bilaterally, round, reactive to light. Eyes both exotropic. Opens eyes to stimulation, not following commands. No movement to command or gentle painful stimulation.  CBC  Recent Labs  12/28/16 0342 12/29/16 0430  WBC 20.6* 18.5*  HGB 11.4* 11.9*  HCT 35.8* 37.6  PLT 251 269   BMET  Recent Labs  12/28/16 0342 12/29/16 0430  NA 139 141  K 3.9 4.1  CL 111 110  CO2 22 25  GLUCOSE 189* 168*  BUN 21* 25*  CREATININE 0.58 0.60  CALCIUM 8.1* 8.0*   ABG    Component Value Date/Time   PHART 7.322 (L) 12/29/2016 0144   PCO2ART 48.8 (H) 12/29/2016 0144   PO2ART 83.0 12/29/2016 0144   HCO3 25.2 12/29/2016 0144   TCO2 27 12/29/2016 0144   ACIDBASEDEF 1.0 12/29/2016 0144   O2SAT 95.0 12/29/2016 0144    Assessment/Plan: Decreased responsiveness persists. Continuing current supportive care.   Hosie Spangle, MD 12/29/2016, 8:11 AM

## 2016-12-29 NOTE — Progress Notes (Signed)
Pt dyssynchronous with vent, RT changed flow and trigger to make harder for patient to double stack breaths. No changes made effected patients breathing pattern. RT changed filters on vent still without success. RT placed patient on PC mode to mimic the PRVC settings to prevent barotrauma bc at times on PRVC with a breath stack tidal volume would be greater than 1500. Pt seems ro be tolerating PC better. Dr Nelda Marseille aware and will be making rounds soon to assess the nedd for paralytic.

## 2016-12-29 NOTE — Progress Notes (Signed)
Called to the room per RN that patient is dyssynchronous with the vent. On arrival  Pt is very dyssynchrony with the vent more so than prior assessment. Now Pt is resembling a breathing pattern that is possibly cheyne strokes respirations. Pt is having some pauses in her respirations even with the vent being on Full Support. Pt is having low volumes and some high PIP on the vent. Vent is frequently alarming due to this regard. RN aware.

## 2016-12-29 NOTE — Progress Notes (Signed)
eLink Physician-Brief Progress Note Patient Name: Aimee [name] DOB: 1975/09/07 MRN: [id number]   Date of Service  12/29/2016  HPI/Events of Note  Patient with [place] on mech vent.  Sedated with high dose propofol and fentanyl. She is having a breathing pattern that may be cheyne stokes.  The pattern is resulting in frequent vent alarm with low pressure, low MV, and occasionally high pressure.   CXR earlier today also suggests ETT may be low.  eICU Interventions  Check ABG and cxr.     Intervention Category Intermediate Interventions: Other:;Respiratory distress - evaluation and management  Mauri [place], P 12/29/2016, 1:01 AM

## 2016-12-30 ENCOUNTER — Inpatient Hospital Stay (HOSPITAL_COMMUNITY): Payer: Medicaid Other

## 2016-12-30 DIAGNOSIS — I608 Other nontraumatic subarachnoid hemorrhage: Secondary | ICD-10-CM

## 2016-12-30 LAB — GLUCOSE, CAPILLARY
Glucose-Capillary: 123 mg/dL — ABNORMAL HIGH (ref 65–99)
Glucose-Capillary: 143 mg/dL — ABNORMAL HIGH (ref 65–99)
Glucose-Capillary: 165 mg/dL — ABNORMAL HIGH (ref 65–99)
Glucose-Capillary: 131 mg/dL — ABNORMAL HIGH (ref 65–99)
Glucose-Capillary: 157 mg/dL — ABNORMAL HIGH (ref 65–99)
Glucose-Capillary: 170 mg/dL — ABNORMAL HIGH (ref 65–99)

## 2016-12-30 LAB — CBC
HCT: 37.7 % (ref 36.0–46.0)
Hemoglobin: 11.6 g/dL — ABNORMAL LOW (ref 12.0–15.0)
MCH: 27 pg (ref 26.0–34.0)
MCHC: 30.8 g/dL (ref 30.0–36.0)
MCV: 87.9 fL (ref 78.0–100.0)
Platelets: 248 10*3/uL (ref 150–400)
RBC: 4.29 MIL/uL (ref 3.87–5.11)
RDW: 16.4 % — ABNORMAL HIGH (ref 11.5–15.5)
WBC: 16.6 10*3/uL — ABNORMAL HIGH (ref 4.0–10.5)

## 2016-12-30 LAB — BASIC METABOLIC PANEL WITH GFR
Anion gap: 6 (ref 5–15)
BUN: 27 mg/dL — ABNORMAL HIGH (ref 6–20)
CO2: 25 mmol/L (ref 22–32)
Calcium: 8.1 mg/dL — ABNORMAL LOW (ref 8.9–10.3)
Chloride: 113 mmol/L — ABNORMAL HIGH (ref 101–111)
Creatinine, Ser: 0.59 mg/dL (ref 0.44–1.00)
GFR calc Af Amer: >60 mL/min (ref >60)
GFR calc non Af Amer: >60 mL/min (ref >60)
Glucose, Bld: 154 mg/dL — ABNORMAL HIGH (ref 65–99)
Potassium: 4.2 mmol/L (ref 3.5–5.1)
Sodium: 144 mmol/L (ref 135–145)

## 2016-12-30 LAB — PHOSPHORUS: Phosphorus: 3.4 mg/dL (ref 2.5–4.6)

## 2016-12-30 LAB — CSF CULTURE W GRAM STAIN: Culture: NO GROWTH

## 2016-12-30 LAB — MAGNESIUM: Magnesium: 2.4 mg/dL (ref 1.7–2.4)

## 2016-12-30 MED ORDER — PNEUMOCOCCAL VAC POLYVALENT 25 MCG/0.5ML IJ INJ
0.5000 mL | INJECTION | INTRAMUSCULAR | Status: AC
  Administered 2016-12-31: 0.5 mL via INTRAMUSCULAR
  Filled 2016-12-30: qty 0.5

## 2016-12-30 NOTE — Progress Notes (Signed)
Pt leaking stool from flexiseal, and leaking pools of urine despite pure wick. Changing pt several times consecutively, causing pt to cough, increase agitation, increase BP and require more sedation. MD notified, Foley placed. Rectal tube d/c as is leaking copiously.

## 2016-12-30 NOTE — Progress Notes (Signed)
STROKE TEAM PROGRESS NOTE   SUBJECTIVE (INTERVAL HISTORY) Patient is much improved, following simple commands, alert, nodding yes/no, squeezing fingers on right not on the left   OBJECTIVE Temp:  [98.3 F (36.8 C)-100.5 F (38.1 C)] 99.4 F (37.4 C) (05/27 1147) Pulse Rate:  [58-101] 76 (05/27 1100) Cardiac Rhythm: Normal sinus rhythm (05/27 0800) Resp:  [8-20] 20 (05/27 1100) BP: (111-184)/(65-119) 157/96 (05/27 1100) SpO2:  [98 %-100 %] 100 % (05/27 1300) FiO2 (%):  [30 %] 30 % (05/27 1300) Weight:  [99.5 kg (219 lb 5.7 oz)] 99.5 kg (219 lb 5.7 oz) (05/27 0427)  CBC:   Recent Labs Lab 12/28/16 0342 12/29/16 0430 12/30/16 0430  WBC 20.6* 18.5* 16.6*  NEUTROABS 18.8* 16.3*  --   HGB 11.4* 11.9* 11.6*  HCT 35.8* 37.6 37.7  MCV 86.3 87.0 87.9  PLT 251 269 454    Basic Metabolic Panel:   Recent Labs Lab 12/28/16 0342 12/29/16 0430 12/30/16 0430  NA 139 141 144  K 3.9 4.1 4.2  CL 111 110 113*  CO2 22 25 25   GLUCOSE 189* 168* 154*  BUN 21* 25* 27*  CREATININE 0.58 0.60 0.59  CALCIUM 8.1* 8.0* 8.1*  MG 2.2  --  2.4  PHOS 2.0*  --  3.4    Lipid Panel:     Component Value Date/Time   TRIG 209 (H) 12/28/2016 0625   HgbA1c: No results found for: HGBA1C Urine Drug Screen:     Component Value Date/Time   LABOPIA NONE DETECTED 12/25/2016 0913   COCAINSCRNUR NONE DETECTED 12/25/2016 0913   LABBENZ POSITIVE (A) 12/25/2016 0913   AMPHETMU NONE DETECTED 12/25/2016 0913   THCU NONE DETECTED 12/25/2016 0913   LABBARB NONE DETECTED 12/25/2016 0913    Alcohol Level No results found for: [place] I have personally reviewed the radiological images below and agree with the radiology interpretations.  Ct Angio Head W Or Wo Contrast Ct Angio Neck W And/or Wo Contrast 12/25/2016  CTA NECK IMPRESSION:  Normal CTA of the neck.   CTA HEAD IMPRESSION:  1. 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 Aneurysm is directed posteriorly, and  demonstrates a fairly narrow neck. Neuro endovascular consultation is recommended.  2. Diffuse irregularity throughout the remaining intracranial arterial vasculature, suspicious for vasospasm secondary to acute subarachnoid hemorrhage.    Cerbral Angiogram / Coiling - Dr Kathyrn Sheriff 12/25/2016 1. 7.5 x 6.50mm right Pcom aneurysm projecting posteriorly, coiled successfully without aneurysm residual. 2. No significant vasospasm seen 3. No other intracranial aneurysms, AVM, or fistulas seen   Ct Head Wo Contrast 12/25/2016 Diffuse subarachnoid hemorrhage, filling CSF spaces in the suprasellar and basal cisterns as well as bilateral sulci. Cause is not determined but presents high suspicion for ruptured intracranial aneurysm.   TTE - Left ventricle: The cavity size was normal. Systolic function was   normal. The estimated ejection fraction was in the range of 55%   to 60%. Wall motion was normal; there were no regional wall   motion abnormalities. Doppler parameters are consistent with   abnormal left ventricular relaxation (grade 1 diastolic   dysfunction). Acoustic contrast opacification revealed no   evidence ofthrombus.  TCD 12/26/16 - no evidence of vasospasm  TCD 12/28/16 - elevated velocity at right MCA and ACA.  Mr Brain and C-spine Wo Contrast 12/26/2016 IMPRESSION: 1. 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 More confluent acute infarcts in the high frontoparietal regions bilaterally. 2. Subarachnoid  and small volume intraventricular hemorrhage. 3. Mild cervical spondylosis.  No spinal stenosis. 4. Unremarkable appearance of the cervical spinal cord.    PHYSICAL EXAM  Temp:  [98.3 F (36.8 C)-100.5 F (38.1 C)] 99.4 F (37.4 C) (05/27 1147) Pulse Rate:  [58-101] 76 (05/27 1100) Resp:  [8-20] 20 (05/27 1100) BP: (111-184)/(65-119) 157/96 (05/27 1100) SpO2:  [98 %-100 %] 100 % (05/27 1300) FiO2 (%):  [30 %] 30 % (05/27 1300) Weight:   [99.5 kg (219 lb 5.7 oz)] 99.5 kg (219 lb 5.7 oz) (05/27 0427)  General - Well nourished, well developed, intubated and sedated. Cannot decrease propofol due to agitation.   Ophthalmologic - Fundi not visualized due to noncooperation.  Cardiovascular - Regular rate and rhythm.  Neuro - intubated on fentanyl and propofol, eyes open and alert  Pupils pinpoint but reactive. right gaze preference, does not blink to threat on the left, positive corneal and gag. No movement on stim Left, can squeeze fingers on the right.  DTR diminished and equivocal toes. Nods yes to sensation on the right arm and leg but not on the left arm and leg..    ASSESSMENT/PLAN Aimee [name] Plan is a 41 y.o. female with history of hypertension and diabetes mellitus presenting with tetraplegia and altered mental status. She did not receive IV t-PA due to subarachnoid hemorrhage.  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  CTA neck - normal  Cerebral angio - no vasospasm.  TCD 12/28/16 increased velocity at right MCA and ACA  2D Echo - EF 55-60%  VTE prophylaxis - SCDs Diet NPO time specified  No antithrombotic prior to admission, now on No antithrombotic secondary to [place].  S/p right PCOM coiling by Dr. 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  LP - Xanthochromia. No sign of infection  Still no good explanation of quadriplegia - may related to "brainstem shock" due to concentrated basal cistern blood product  - expect pt symptom will improve over time if this is the case  Recommend to repeat MRI brain and C-spine (include DWI) as well as MRA Sunday evening to evaluate any brainstem infarct or cerebral vasospasm  Hypertension  Stable - IV Cardene  BP goal < 160  Long-term BP goal normotensive  Other Stroke Risk Factors  Obesity, Body mass index is 37.65 kg/m., recommend weight loss, diet and exercise as appropriate   Other Active Problems  Leukocytosis - 16.1 -> 23.6 -> 20.6 -> 18.5 -> 16.6 (temp 99.4 Ax) (on Decadron)  IV Rocephin / vancomycin   PLAN  Consider repeat MRI brain and C-spine (include DWI) as well as MRA on Sunday Evening  to evaluate any brainstem infarct or cerebral vasospasm - as per Dr Erlinda Hong -> Will defer to Dr. Kathyrn Sheriff. Studies pending.  Hospital day # 5  This patient is critically ill due to diffuse basal SAH, ruptured PCOM aneurysm, HTN, quadriplegia and at significant risk of neurological worsening, death form recurrent SAH, vasospasm, hydrocephalus, seizure. This patient's care requires constant monitoring of vital  signs, hemodynamics, respiratory and cardiac monitoring, review of multiple databases, neurological assessment, discussion with family, other specialists and medical decision making of high complexity. I spent 30 minutes of neurocritical care time in the care of this patient.    To contact Stroke Continuity provider, please refer to [email or web address] After hours, contact General Neurology

## 2016-12-30 NOTE — Progress Notes (Signed)
Pt is unable to do NIF & VC at this time

## 2016-12-30 NOTE — Progress Notes (Signed)
No issues overnight. Pt requiring sedation for vent synchrony.  EXAM:  BP 127/86   Pulse 79   Temp 98.8 F (37.1 C) (Axillary)   Resp 13   Ht 5\' 4"  (1.626 m)   Wt 99.5 kg (219 lb 5.7 oz)   LMP  (LMP Unknown) Comment: vented pt sah  SpO2 98%   BMI 37.65 kg/m   On propofol, fentanyl: No eye opening Pupils 24mm Breathing over vent No motor responses to pain  IMPRESSION:  41 y.o. female SAH d# 6 s/p right Pcom coiling, remains quadriparetic.  PLAN: - Repeat MRI brain/C spine without Gad today - Cont supportive care

## 2016-12-30 NOTE — Progress Notes (Signed)
Patient is unable to perform parameters at this time.

## 2016-12-30 NOTE — Progress Notes (Signed)
PULMONARY / CRITICAL CARE MEDICINE   Name: Aimee [name] MRN: [id number] DOB: 1976/03/28    ADMISSION DATE:  12/25/2016 CONSULTATION DATE:  12/25/2016  REFERRING MD:  Dr. Dina Rich, EDP  CHIEF COMPLAINT:  AMS  HISTORY OF PRESENT ILLNESS: 41 , SAH, coil, vent  SUBJECTIVE No events overnight, wakes up and appropriate with WUA  VITAL SIGNS: BP (!) 157/96   Pulse 76   Temp 99.4 F (37.4 C) (Axillary)   Resp 20   Ht _0  (1.626 m)   Wt 99.5 kg (219 lb 5.7 oz)   LMP  (LMP Unknown) Comment: vented pt sah  SpO2 100%   BMI 37.65 kg/m   HEMODYNAMICS:    VENTILATOR SETTINGS: Vent Mode: PCV FiO2 (%):  [30 %] 30 % Set Rate:  [18 bmp] 18 bmp PEEP:  [5 cmH20-8 cmH20] 5 cmH20 Plateau Pressure:  [18 cmH20-26 cmH20] 18 cmH20  INTAKE / OUTPUT: I/O last 3 completed shifts: In: 6995.6 [I.V.:4630.6; NG/GT:950; IV Piggyback:1415] Out: 2175 [Urine:1100; Emesis/NG output:1075]  PHYSICAL EXAMINATION: General:  Sedate and unresponsive Neuro: PERRL, EOM-I and MMM HEENT: [place]/AT, PERRL, EOM-I and MMM PULM: CTA bilaterally CV: RRR, Nl S1/S2, -M/R/G GI: soft, slight obese, NT, nd Extremities: edema increased  LABS:  BMET  Recent Labs Lab 12/28/16 0342 12/29/16 0430 12/30/16 0430  NA 139 141 144  K 3.9 4.1 4.2  CL 111 110 113*  CO2 _1 BUN 21* 25* 27*  CREATININE 0.58 0.60 0.59  GLUCOSE 189* 168* 154*   Electrolytes  Recent Labs Lab 12/27/16 1902 12/28/16 0342 12/29/16 0430 12/30/16 0430  CALCIUM  --  8.1* 8.0* 8.1*  MG 2.3 2.2  --  2.4  PHOS 2.2* 2.0*  --  3.4   CBC  Recent Labs Lab 12/28/16 0342 12/29/16 0430 12/30/16 0430  WBC 20.6* 18.5* 16.6*  HGB 11.4* 11.9* 11.6*  HCT 35.8* 37.6 37.7  PLT 251 269 248   Coag's  Recent Labs Lab 12/25/16 0403 12/25/16 0944  APTT 30 32  INR 1.04 1.08   Sepsis Markers No results for input(s): LATICACIDVEN, PROCALCITON, O2SATVEN in the last 168 hours.  ABG  Recent Labs Lab 12/27/16 0408  12/29/16 0144 12/30/16 0440  PHART 7.343* 7.322* 7.386  PCO2ART 34.2 48.8* 39.9  PO2ART 90.0 83.0 96.7   Liver Enzymes  Recent Labs Lab 12/25/16 0403 12/28/16 0342  AST 20 46*  ALT 15 51  ALKPHOS 70 48  BILITOT 0.6 0.5  ALBUMIN 3.8 3.2*   Cardiac Enzymes No results for input(s): TROPONINI, PROBNP in the last 168 hours.  Glucose  Recent Labs Lab 12/29/16 1535 12/29/16 1930 12/29/16 2352 12/30/16 0333 12/30/16 0742 12/30/16 1149  GLUCAP 152* 131* 157* 123* 143* 165*   Imaging Dg Chest Port 1 View  Result Date: 12/30/2016 CLINICAL DATA:  Endotracheal tube placement. EXAM: PORTABLE CHEST 1 VIEW COMPARISON:  Radiograph Dec 29, 2016. FINDINGS: Stable cardiomediastinal silhouette. Stable position of nasogastric and endotracheal tubes. Right internal jugular venous catheter is unchanged. Hypoinflation of the lungs is noted. No acute pulmonary disease is noted. No pneumothorax or pleural effusion is noted. Bony thorax is unremarkable. IMPRESSION: Stable support apparatus.  Hypoinflation of the lungs. Electronically Signed   By: Marijo Conception, M.D.   On: 12/30/2016 08:29   STUDIES:  CT Head 5/22 > Diffuse subarachnoid hemorrhage, filling CSF spaces in the suprasellar and basal cisterns as well as bilateral sulci. 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 Aneurysm is directed posteriorly, and demonstrates a fairly narrow neck. Neuro endovascular consultation is recommended. 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 More confluent acute infarcts in the high frontoparietal regions bilaterally. 2. Subarachnoid and small volume intraventricular hemorrhage. 3.  Mild cervical spondylosis.  No spinal stenosis. 4. Unremarkable appearance of the cervical spinal cord.  CULTURES: Csf 5/23>>>   ANTIBIOTICS: CTX 5/23>>> Vanc 5/23>>>  SIGNIFICANT EVENTS: 5/22 > SAH, coil 5/23- not moving ext 5/24 LP  LINES/TUBES: ETT 5/22 >> 5/22 rt IJ>>>  DISCUSSION: 41 year old female presents with N/V and Confusion. 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 P:  Cardiac Monitoring  BP Goals met High risk vasospasm, keep pos balance, will defer to neuro Echo, no clot, grade I diastolic dysfunction  RENAL A:   Hypokalemia  Mild improved High risk vasospasm P:   Chem in am  Positive fluid balance per neuro  GASTROINTESTINAL A:   Dysphagia presumed P:   NPO PPI TF per neurology  HEMATOLOGIC A:   SAH Mild anemia leukocytisis P:  SCD Coags WNL  INFECTIOUS A:   Leukocytosis slow resolving R/o endocarditis R/o mycotic Aneurysm LP unimpressive infectious source of any kind, appears as SAH P:   Trend Fever curve Empiric CTX, vanc, will dc vanc for now Echo pending BC pending  ENDOCRINE A:   DM    P:    SSI CBG  NEUROLOGIC A:   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  limited ext movement not clear Emboli noted P:   RASS goal: -1/-2, prop needed Neurology following  Neurosurgery consult appreciated MRI per neuro Get EEG TCD pos balance needed May need repeat there LP in a few days pending mental status  FAMILY  - Updates: No family bedside  -  Inter-disciplinary family meet or Palliative Care meeting due by:  5/29  The patient is critically ill with multiple organ systems failure and requires high complexity decision making for assessment and support, frequent evaluation and titration of therapies, application of advanced monitoring technologies and extensive interpretation of multiple databases.   Critical Care Time devoted to patient care services described in this note is  35  Minutes. This time reflects time of care of this signee Dr Jennet Maduro. This critical care time does not reflect procedure time, or teaching time or supervisory time of PA/NP/Med student/Med Resident etc but could involve care discussion time.  Rush Farmer, M.D. [facility] Pulmonary/Critical Care Medicine. Pager: [phone]. After hours pager: [phone].

## 2016-12-30 NOTE — Progress Notes (Signed)
New bottle propofol hung in mri at 1600, MAR will not allow me to chart on it.

## 2016-12-31 ENCOUNTER — Inpatient Hospital Stay (HOSPITAL_COMMUNITY): Payer: Medicaid Other

## 2016-12-31 ENCOUNTER — Encounter (HOSPITAL_COMMUNITY): Payer: Self-pay

## 2016-12-31 DIAGNOSIS — R569 Unspecified convulsions: Secondary | ICD-10-CM

## 2016-12-31 DIAGNOSIS — I10 Essential (primary) hypertension: Secondary | ICD-10-CM

## 2016-12-31 LAB — BLOOD GAS, ARTERIAL
Acid-base deficit: 1 mmol/L (ref 0.0–2.0)
Acid-base deficit: 0.3 mmol/L (ref 0.0–2.0)
Bicarbonate: 23.4 mmol/L (ref 20.0–28.0)
Bicarbonate: 22.6 mmol/L (ref 20.0–28.0)
Drawn by: 426241
Drawn by: 41977
FIO2: 30
FIO2: 30
O2 Saturation: 97.2 %
O2 Saturation: 96.6 %
PEEP: 5 cmH2O
PEEP: 5 cmH2O
Patient temperature: 98.6
Patient temperature: 98.6
Pressure control: 22 cmH2O
Pressure control: 22 cmH2O
RATE: 18 {breaths}/min
RATE: 18 {breaths}/min
pCO2 arterial: 39.9 mmHg (ref 32.0–48.0)
pCO2 arterial: 29.3 mmHg — ABNORMAL LOW (ref 32.0–48.0)
pH, Arterial: 7.386 (ref 7.350–7.450)
pH, Arterial: 7.499 — ABNORMAL HIGH (ref 7.350–7.450)
pO2, Arterial: 96.7 mmHg (ref 83.0–108.0)
pO2, Arterial: 82.5 mmHg — ABNORMAL LOW (ref 83.0–108.0)

## 2016-12-31 LAB — GLUCOSE, CAPILLARY
Glucose-Capillary: 134 mg/dL — ABNORMAL HIGH (ref 65–99)
Glucose-Capillary: 163 mg/dL — ABNORMAL HIGH (ref 65–99)
Glucose-Capillary: 132 mg/dL — ABNORMAL HIGH (ref 65–99)
Glucose-Capillary: 150 mg/dL — ABNORMAL HIGH (ref 65–99)
Glucose-Capillary: 129 mg/dL — ABNORMAL HIGH (ref 65–99)
Glucose-Capillary: 219 mg/dL — ABNORMAL HIGH (ref 65–99)
Glucose-Capillary: 190 mg/dL — ABNORMAL HIGH (ref 65–99)

## 2016-12-31 LAB — CBC
HCT: 34.1 % — ABNORMAL LOW (ref 36.0–46.0)
Hemoglobin: 11.1 g/dL — ABNORMAL LOW (ref 12.0–15.0)
MCH: 27.8 pg (ref 26.0–34.0)
MCHC: 32.6 g/dL (ref 30.0–36.0)
MCV: 85.5 fL (ref 78.0–100.0)
Platelets: 222 10*3/uL (ref 150–400)
RBC: 3.99 MIL/uL (ref 3.87–5.11)
RDW: 15.8 % — ABNORMAL HIGH (ref 11.5–15.5)
WBC: 14.2 10*3/uL — ABNORMAL HIGH (ref 4.0–10.5)

## 2016-12-31 LAB — BASIC METABOLIC PANEL WITH GFR
Anion gap: 8 (ref 5–15)
BUN: 22 mg/dL — ABNORMAL HIGH (ref 6–20)
CO2: 24 mmol/L (ref 22–32)
Calcium: 8 mg/dL — ABNORMAL LOW (ref 8.9–10.3)
Chloride: 109 mmol/L (ref 101–111)
Creatinine, Ser: 0.51 mg/dL (ref 0.44–1.00)
GFR calc Af Amer: >60 mL/min
GFR calc non Af Amer: >60 mL/min
Glucose, Bld: 160 mg/dL — ABNORMAL HIGH (ref 65–99)
Potassium: 3.6 mmol/L (ref 3.5–5.1)
Sodium: 141 mmol/L (ref 135–145)

## 2016-12-31 LAB — TRIGLYCERIDES: Triglycerides: 378 mg/dL — ABNORMAL HIGH

## 2016-12-31 LAB — PATHOLOGIST SMEAR REVIEW

## 2016-12-31 LAB — PHOSPHORUS: Phosphorus: 2.8 mg/dL (ref 2.5–4.6)

## 2016-12-31 LAB — MAGNESIUM: Magnesium: 2.1 mg/dL (ref 1.7–2.4)

## 2016-12-31 MED ORDER — POTASSIUM CHLORIDE 10 MEQ/100ML IV SOLN
10.0000 meq | INTRAVENOUS | Status: AC
  Administered 2016-12-31 (×2): 10 meq via INTRAVENOUS
  Filled 2016-12-31 (×2): qty 100

## 2016-12-31 NOTE — Progress Notes (Signed)
PT Cancellation Note  Patient Details Name: Aimee [name] MRN: [id number] DOB: 1975/09/21   Cancelled Treatment:    Reason Eval/Treat Not Completed: Patient not medically ready. Pt remains on bedrest and is intubated. PT signing off at this time. Please reorder when pt is appropriate for PT.    Thelma Comp 12/31/2016, 7:33 AM   Rolinda Roan, PT, DPT Acute Rehabilitation Services Pager: [phone]

## 2016-12-31 NOTE — Procedures (Signed)
Extubation Procedure Note  Patient Details:   Name: [name] Plan DOB: 15-Sep-1975 MRN: [id number]   Airway Documentation:     Evaluation  O2 sats: stable throughout Complications: No apparent complications Patient did tolerate procedure well. Bilateral Breath Sounds: Rhonchi   Yes Placed on 4L [place]  SPO2 98% HR 85, RR 17.  Pt tolerated well. Littie Deeds [facility] 12/31/2016, 8:43 AM

## 2016-12-31 NOTE — Progress Notes (Signed)
No issues overnight. Extubated this am, appears comfortable  EXAM:  BP (!) 143/93   Pulse 93   Temp 99.6 F (37.6 C)   Resp 10   Ht 5\' 4"  (1.626 m)   Wt 97.6 kg (215 lb 2.7 oz)   LMP  (LMP Unknown) Comment: vented pt sah  SpO2 100%   BMI 36.93 kg/m   Awake, alert CN grossly intact  Follows commands, wiggles fingers right hand No movement LUE, BLE Sensation appears grossly intact to LT in all extremities  TCD velocities reviewed, largely unremarkable MRI reviewed, largely unremarkable, small scattered embolic infarcts. No HCP, resolving SAH.  IMPRESSION:  41 y.o. female SAH d# 7, remains stable with dense quadriparesis  PLAN: - Cont current supportive care

## 2016-12-31 NOTE — Progress Notes (Addendum)
Transcranial Doppler  Date POD PCO2 HCT BP  MCA ACA PCA OPHT SIPH VERT Basilar  5/23 rs     Right  Left   76  44   -52  -48   42  31   27  41   29  48   *  -37   -35      5/25 je     Right  Left   103  118   -95  -94   58  49   37  48   51  72   -35  *   -34      5/28 je     Right  Left   101  113   -53  -68   34  -36   33  42   56  72   -42  *   *            Right  Left                                             Right  Left                                            Right  Left                                            Right  Left                                        MCA = Middle Cerebral Artery      OPHT = Opthalmic Artery     BASILAR = Basilar Artery   ACA = Anterior Cerebral Artery     SIPH = Carotid Siphon PCA = Posterior Cerebral Artery   VERT = Verterbral Artery                   Normal MCA = 62+\-12 ACA = 50+\-12 PCA = 42+\-23   12/26/16 - * not insonated rds 5/25 [place] ratio: right = 2.8 left = 3.2 JE 5/28 Unable to complete study (left vert and basilar) due to increased movement. Patient was recently extubated. [place] ratio: right = 2.5 left = 2.9 JE

## 2016-12-31 NOTE — Progress Notes (Signed)
PULMONARY / CRITICAL CARE MEDICINE   Name: Aimee [name] MRN: [id number] DOB: February 19, 1976    ADMISSION DATE:  12/25/2016 CONSULTATION DATE:  12/25/2016  REFERRING MD:  Dr. Dina Rich, EDP  CHIEF COMPLAINT:  AMS  HISTORY OF PRESENT ILLNESS: 41 , SAH, coil, vent  SUBJECTIVE No events overnight, wakes up and appropriate with WUA  VITAL SIGNS: BP 135/80   Pulse 84   Temp 99.6 F (37.6 C)   Resp (!) 24   Ht _0  (1.626 m)   Wt 97.6 kg (215 lb 2.7 oz)   LMP  (LMP Unknown) Comment: vented pt sah  SpO2 100%   BMI 36.93 kg/m   HEMODYNAMICS:    VENTILATOR SETTINGS: Vent Mode: PSV;CPAP FiO2 (%):  [30 %] 30 % Set Rate:  [18 bmp] 18 bmp PEEP:  [5 cmH20] 5 cmH20 Pressure Support:  [5 cmH20] 5 cmH20 Plateau Pressure:  [18 cmH20-21 cmH20] 21 cmH20  INTAKE / OUTPUT: I/O last 3 completed shifts: In: 6335.3 [I.V.:4685.3; NG/GT:1080; IV Piggyback:570] Out: 5400 [Urine:5300; Stool:100]  PHYSICAL EXAMINATION: General:  Alert and following commands on the right not left Neuro: PERRL, EOM-I and MMM HEENT: [place]/AT, PERRL, EOM-I and MMM PULM: CTA bilaterally CV: RRR, Nl S1/S2, -M/R/G GI: Soft, NT, ND and +BS Extremities: edema increased  LABS:  BMET  Recent Labs Lab 12/29/16 0430 12/30/16 0430 12/31/16 0415  NA 141 144 141  K 4.1 4.2 3.6  CL 110 113* 109  CO2 _1 BUN 25* 27* 22*  CREATININE 0.60 0.59 0.51  GLUCOSE 168* 154* 160*   Electrolytes  Recent Labs Lab 12/28/16 0342 12/29/16 0430 12/30/16 0430 12/31/16 0415  CALCIUM 8.1* 8.0* 8.1* 8.0*  MG 2.2  --  2.4 2.1  PHOS 2.0*  --  3.4 2.8   CBC  Recent Labs Lab 12/29/16 0430 12/30/16 0430 12/31/16 0415  WBC 18.5* 16.6* 14.2*  HGB 11.9* 11.6* 11.1*  HCT 37.6 37.7 34.1*  PLT 269 248 222   Coag's  Recent Labs Lab 12/25/16 0403 12/25/16 0944  APTT 30 32  INR 1.04 1.08   Sepsis Markers No results for input(s): LATICACIDVEN, PROCALCITON, O2SATVEN in the last 168 hours.  ABG  Recent  Labs Lab 12/29/16 0144 12/30/16 0440 12/31/16 0320  PHART 7.322* 7.386 7.499*  PCO2ART 48.8* 39.9 29.3*  PO2ART 83.0 96.7 82.5*   Liver Enzymes  Recent Labs Lab 12/25/16 0403 12/28/16 0342  AST 20 46*  ALT 15 51  ALKPHOS 70 48  BILITOT 0.6 0.5  ALBUMIN 3.8 3.2*   Cardiac Enzymes No results for input(s): TROPONINI, PROBNP in the last 168 hours.  Glucose  Recent Labs Lab 12/30/16 1149 12/30/16 1517 12/30/16 1950 12/30/16 2321 12/31/16 0314 12/31/16 0802  GLUCAP 165* 131* 157* 170* 134* 163*   Imaging Mr Brain Wo Contrast  Result Date: 12/30/2016 CLINICAL DATA:  Quadriparesis. Recent subarachnoid hemorrhage and subsequent posterior communicating aneurysm coiling. EXAM: MRI HEAD WITHOUT CONTRAST MRI CERVICAL SPINE WITHOUT CONTRAST TECHNIQUE: Multiplanar, multiecho pulse sequences of the brain and surrounding structures, and cervical spine, to include the craniocervical junction and cervicothoracic junction, were obtained without intravenous contrast. COMPARISON:  12/26/2016 FINDINGS: MRI HEAD FINDINGS Brain: Scattered small embolic infarcts are again seen in the right greater than left cerebral hemispheres and left cerebellum with more confluent regions of confluent cortically based restricted diffusion in the frontoparietal regions at the vertex bilaterally. A few of the embolic infarcts are at most minimally more prominent than on the prior MRI (such as one  in the posterior limb of the right internal capsule), however no new infarcts are identified. Frontoparietal gyral edema at the vertex bilaterally is similar to the prior study. FLAIR hyperintensity throughout cerebral sulci bilaterally is less extensive than on the prior study and likely reflects partial clearing of subarachnoid hemorrhage, with artifactually increased signal from mechanical ventilation and hyperoxygenation also potentially contributing to the residual signal. Intraventricular blood products have also  decreased, with a small amount persisting in the right greater than left occipital horns. There is no ventricular dilatation. No mass, midline shift, or extra-axial fluid collection is seen. Vascular: Major intracranial vascular flow voids are preserved. Right posterior communicating aneurysm coiling. Skull and upper cervical spine: Unremarkable bone marrow signal. Skull hyperostosis. Sinuses/Orbits: Unremarkable orbits. Slightly decreased left maxillary sinus opacification by complex material. Clear mastoid air cells. Fluid in the pharynx. Other: None. MRI CERVICAL SPINE FINDINGS Alignment: Normal. Vertebrae: No fracture or evidence of discitis. Unchanged subcentimeter C6 vertebral body lesion, likely benign. Cord: Normal signal and morphology. Posterior Fossa, vertebral arteries, paraspinal tissues: Partially visualized endotracheal and enteric tubes with moderate volume fluid in the pharynx. Disc levels: Unchanged mild cervical spondylosis including a small central disc protrusion at C4-5 and tiny central disc protrusions at C3-4 and C5-6 without significant spinal stenosis or spinal cord mass effect. IMPRESSION: 1. Overall similar appearance of scattered bilateral embolic infarcts and confluent cortical infarcts at the vertex bilaterally. 2. No new infarcts. 3. Decreasing subarachnoid and intraventricular hemorrhage. 4. Unremarkable appearance of the cervical spinal cord. Electronically Signed   By: Logan Bores M.D.   On: 12/30/2016 17:59   Mr Cervical Spine Wo Contrast  Result Date: 12/30/2016 CLINICAL DATA:  Quadriparesis. Recent subarachnoid hemorrhage and subsequent posterior communicating aneurysm coiling. EXAM: MRI HEAD WITHOUT CONTRAST MRI CERVICAL SPINE WITHOUT CONTRAST TECHNIQUE: Multiplanar, multiecho pulse sequences of the brain and surrounding structures, and cervical spine, to include the craniocervical junction and cervicothoracic junction, were obtained without intravenous contrast.  COMPARISON:  12/26/2016 FINDINGS: MRI HEAD FINDINGS Brain: Scattered small embolic infarcts are again seen in the right greater than left cerebral hemispheres and left cerebellum with more confluent regions of confluent cortically based restricted diffusion in the frontoparietal regions at the vertex bilaterally. A few of the embolic infarcts are at most minimally more prominent than on the prior MRI (such as one in the posterior limb of the right internal capsule), however no new infarcts are identified. Frontoparietal gyral edema at the vertex bilaterally is similar to the prior study. FLAIR hyperintensity throughout cerebral sulci bilaterally is less extensive than on the prior study and likely reflects partial clearing of subarachnoid hemorrhage, with artifactually increased signal from mechanical ventilation and hyperoxygenation also potentially contributing to the residual signal. Intraventricular blood products have also decreased, with a small amount persisting in the right greater than left occipital horns. There is no ventricular dilatation. No mass, midline shift, or extra-axial fluid collection is seen. Vascular: Major intracranial vascular flow voids are preserved. Right posterior communicating aneurysm coiling. Skull and upper cervical spine: Unremarkable bone marrow signal. Skull hyperostosis. Sinuses/Orbits: Unremarkable orbits. Slightly decreased left maxillary sinus opacification by complex material. Clear mastoid air cells. Fluid in the pharynx. Other: None. MRI CERVICAL SPINE FINDINGS Alignment: Normal. Vertebrae: No fracture or evidence of discitis. Unchanged subcentimeter C6 vertebral body lesion, likely benign. Cord: Normal signal and morphology. Posterior Fossa, vertebral arteries, paraspinal tissues: Partially visualized endotracheal and enteric tubes with moderate volume fluid in the pharynx. Disc levels: Unchanged mild cervical spondylosis including a small  central disc protrusion at C4-5  and tiny central disc protrusions at C3-4 and C5-6 without significant spinal stenosis or spinal cord mass effect. IMPRESSION: 1. Overall similar appearance of scattered bilateral embolic infarcts and confluent cortical infarcts at the vertex bilaterally. 2. No new infarcts. 3. Decreasing subarachnoid and intraventricular hemorrhage. 4. Unremarkable appearance of the cervical spinal cord. Electronically Signed   By: Logan Bores M.D.   On: 12/30/2016 17:59   Dg Chest Port 1 View  Result Date: 12/31/2016 CLINICAL DATA:  Check endotracheal tube placement EXAM: PORTABLE CHEST 1 VIEW COMPARISON:  12/30/2016 FINDINGS: Cardiac shadow is stable in appearance. Central venous line, endotracheal tube and nasogastric catheter are again noted and stable. The overall inspiratory effort is again poor without focal infiltrate or sizable effusion. IMPRESSION: Poor inspiratory effort. Tubes and lines as described. Electronically Signed   By: Inez Catalina M.D.   On: 12/31/2016 08:20   STUDIES:  CT Head 5/22 > Diffuse subarachnoid hemorrhage, filling CSF spaces in the suprasellar and basal cisterns as well as bilateral sulci. 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 Aneurysm is directed posteriorly, and demonstrates a fairly narrow neck. Neuro endovascular consultation is recommended. 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 More confluent acute infarcts in the high frontoparietal regions bilaterally. 2. Subarachnoid and small volume intraventricular hemorrhage. 3. Mild cervical spondylosis.  No spinal stenosis. 4. Unremarkable appearance of the cervical spinal cord.  CULTURES: Csf 5/23>>>    ANTIBIOTICS: CTX 5/23>>> Vanc 5/23>>>  SIGNIFICANT EVENTS: 5/22 > SAH, coil 5/23- not moving ext 5/24 LP  LINES/TUBES: ETT 5/22 >> 5/22 rt IJ>>>  DISCUSSION: 41 year old female presents with N/V and Confusion. 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 P:  Cardiac Monitoring  BP Goals met High risk vasospasm, keep pos balance, will defer to neuro Echo, no clot, grade I diastolic dysfunction  RENAL A:   Hypokalemia  Mild improved High risk vasospasm P:   Chem in am  Positive fluid balance per neuro  GASTROINTESTINAL A:   Dysphagia presumed P:   SLP NPO PPI D/C TF  HEMATOLOGIC A:   SAH Mild anemia leukocytisis P:  SCD Coags WNL  INFECTIOUS A:   Leukocytosis slow resolving R/o endocarditis R/o mycotic Aneurysm LP unimpressive infectious source of any kind, appears as SAH P:   Trend Fever curve Empiric CTX, vanc, will dc vanc for now [facility] pending  ENDOCRINE A:   DM    P:    SSI CBG  NEUROLOGIC A:   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   Critical Care Time devoted to patient care services described in this note is  35  Minutes. This time reflects time of care of this signee Dr Jennet Maduro. This critical care time does not reflect procedure time, or teaching time or supervisory time of PA/NP/Med student/Med Resident etc but could involve care discussion time.  Rush Farmer, M.D. [facility] Pulmonary/Critical Care Medicine. Pager: [phone]. After hours pager: [phone].

## 2016-12-31 NOTE — Progress Notes (Signed)
OT Cancellation Note  Patient Details Name: Aimee [name] MRN: [id number] DOB: Jul 16, 1976   Cancelled Treatment:    Reason Eval/Treat Not Completed: Other (comment). Pt remains on bedrest and is intubated. OT signing off at this time. Please reorder when appropriate for OT. Thanks.  [place], OT/L  [phone] 12/31/2016 12/31/2016, 6:59 AM

## 2016-12-31 NOTE — Evaluation (Signed)
Clinical/Bedside Swallow Evaluation Patient Details  Name: Aimee [name] MRN: [id number] Date of Birth: April 30, 1976  Today's Date: 12/31/2016 Time: SLP Start Time (ACUTE ONLY): 1416 SLP Stop Time (ACUTE ONLY): 1443 SLP Time Calculation (min) (ACUTE ONLY): 27 min  Past Medical History:  Past Medical History:  Diagnosis Date  . Diabetes mellitus without complication ([place])   . Hypertension    Past Surgical History:  Past Surgical History:  Procedure Laterality Date  . RADIOLOGY WITH ANESTHESIA N/A 12/25/2016   Procedure: RADIOLOGY WITH ANESTHESIA;  Surgeon: Consuella Lose, MD;  Location: [facility];  Service: Radiology;  Laterality: N/A;   HPI:  41 year old female presents with N/V and Confusion. 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 Underwent coiling, Intubated from 5/22 to 5/28.    Assessment / [name] / Recommendation Clinical Impression  Pt demonstrates concern for reduced airway protection following 7 day intubation given dysphonic voice and immediate and delayed coughing with liquids and purees. Hopeful that impairment is secondary to laryngeal edema and will improve over the next 24 to 48 hours. SLP did notice slight lingual deviation to the left and neuromuscular impairment impacting swallow cannot be ruled out. Will f/u for PO trials and readiness for objective testing as needed.  SLP Visit Diagnosis: Dysphagia, oropharyngeal phase (R13.12)    Aspiration Risk  Moderate aspiration risk    Diet Recommendation NPO   Medication Administration: Via alternative means    Other  Recommendations Oral Care Recommendations: Oral care QID   Follow up Recommendations Inpatient Rehab      Frequency and Duration min 2x/week  2 weeks       Prognosis Prognosis for Safe Diet Advancement: Good      Swallow Study   General HPI: 41 year old female presents with N/V and Confusion. CTA head revealed 7 x 6 x 5 mm right posterior communicating  artery aneurysm. Underwent coiling, Intubated from 5/22 to 5/28.  Type of Study: Bedside Swallow Evaluation Diet Prior to this Study: NPO Temperature Spikes Noted: No Respiratory Status: Nasal cannula History of Recent Intubation: Yes Length of Intubations (days): 7 days Date extubated: 12/31/16 Behavior/Cognition: Alert;Cooperative;Requires cueing Oral Care Completed by SLP: No Oral Cavity - Dentition: Adequate natural dentition Vision: Functional for self-feeding Self-Feeding Abilities: Needs assist Patient Positioning: Upright in bed Baseline Vocal Quality: Breathy;Hoarse Volitional Cough: Weak Volitional Swallow: Able to elicit    Oral/Motor/Sensory Function Overall Oral Motor/Sensory Function: Mild impairment Facial ROM: Within Functional Limits Facial Symmetry: Within Functional Limits Facial Strength: Within Functional Limits Facial Sensation: Within Functional Limits Lingual ROM: Reduced left (Questionable) Lingual Symmetry: Abnormal symmetry left (questionable) Lingual Strength: Reduced   Ice Chips Ice chips: Impaired Presentation: Spoon Pharyngeal Phase Impairments: Cough - Immediate   Thin Liquid Thin Liquid: Impaired Presentation: Cup Pharyngeal  Phase Impairments: Cough - Immediate    Nectar Thick Nectar Thick Liquid: Not tested   Honey Thick Honey Thick Liquid: Not tested   Puree Puree: Impaired Presentation: Spoon Pharyngeal Phase Impairments: Cough - Delayed   Solid   GO   Solid: Not tested       Herbie Baltimore, MA CCC-SLP [phone]  Lynann Beaver 12/31/2016,3:03 PM

## 2016-12-31 NOTE — Progress Notes (Signed)
SLP Cancellation Note  Patient Details Name: Aimee [name] MRN: [id number] DOB: Jul 02, 1976   Cancelled treatment:       Reason Eval/Treat Not Completed: Other (comment) Attempted cognitive linguistic eval, Pt very dysphonic and struggling to respond audibly. She also consistently chooses english for communication with me although Spanish was offered. 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 Suspect pt may have more success in Spanish given apparent cognitive deficits, but will continue efforts.   Herbie Baltimore, [place] CCC-SLP [phone]  Lynann Beaver 12/31/2016, 3:04 PM

## 2016-12-31 NOTE — Plan of Care (Signed)
Problem: Education: Goal: Knowledge of disease or condition will improve Outcome: Not Progressing Pt intubated & sedated  Goal: Knowledge of secondary prevention will improve Outcome: Not Progressing Pt intubated & sedated  Goal: Knowledge of patient specific risk factors addressed and post discharge goals established will improve Outcome: Not Progressing Pt intubated & sedated   Problem: Coping: Goal: Ability to verbalize positive feelings about self will improve Outcome: Not Progressing Pt intubated & sedated  Goal: Ability to identify appropriate support needs will improve Outcome: Not Progressing Pt intubated & sedated  Goal: Ability to identify strategies to decrease anxiety will improve Outcome: Not Progressing Pt intubated & sedated   Problem: Health Behavior/Discharge Planning: Goal: Ability to manage health-related needs will improve Outcome: Not Progressing Pt intubated & sedated   Problem: Self-Care: Goal: Ability to participate in self-care as condition permits will improve Outcome: Not Progressing Pt intubated & sedated  Goal: Verbalization of feelings and concerns over difficulty with self-care will improve Outcome: Not Progressing Pt intubated & sedated  Goal: Ability to communicate needs accurately will improve Outcome: Not Progressing Pt intubated & sedated   Problem: Nutrition: Goal: Risk of aspiration will decrease Outcome: Not Progressing Pt intubated & sedated  Goal: Dietary intake will improve Outcome: Progressing On tube feed  Problem: Tissue Perfusion: Goal: Cerebral tissue perfusion will improve (applicable to all stroke diagnoses) Outcome: Progressing No s/s of stroke complications Goal: Complications of Spontaneous Subarachnoid Hemorrhage will be minimized (choose ONE based on patient diagnosis) Outcome: Progressing No s/s of complications

## 2016-12-31 NOTE — Progress Notes (Signed)
STROKE TEAM PROGRESS NOTE   SUBJECTIVE (INTERVAL HISTORY) Patient is Extubated this morning and improved, following simple commands, alert, nodding yes/no, squeezing fingers on right not on the left. Transcranial Dopplers show stable mildly elevated right MCA velocities with normal mean flow velocities in remaining identified vessels. Repeat MRI scan of the brain shows a few tiny left cerebellar and subcortical infarcts but not large enough to explain significant right hemiplegia. MRI of the cervical spine shows no definite spinal cord infarct.  OBJECTIVE Temp:  [99 F (37.2 C)-100.3 F (37.9 C)] 99.4 F (37.4 C) (05/28 1200) Pulse Rate:  [57-93] 78 (05/28 1300) Cardiac Rhythm: Normal sinus rhythm (05/28 0700) Resp:  [10-24] 13 (05/28 1300) BP: (127-161)/(71-107) 152/102 (05/28 1300) SpO2:  [97 %-100 %] 100 % (05/28 1300) FiO2 (%):  [30 %] 30 % (05/28 0807) Weight:  [215 lb 2.7 oz (97.6 kg)] 215 lb 2.7 oz (97.6 kg) (05/28 0419)  CBC:   Recent Labs Lab 12/28/16 0342 12/29/16 0430 12/30/16 0430 12/31/16 0415  WBC 20.6* 18.5* 16.6* 14.2*  NEUTROABS 18.8* 16.3*  --   --   HGB 11.4* 11.9* 11.6* 11.1*  HCT 35.8* 37.6 37.7 34.1*  MCV 86.3 87.0 87.9 85.5  PLT 251 269 248 329    Basic Metabolic Panel:   Recent Labs Lab 12/30/16 0430 12/31/16 0415  NA 144 141  K 4.2 3.6  CL 113* 109  CO2 25 24  GLUCOSE 154* 160*  BUN 27* 22*  CREATININE 0.59 0.51  CALCIUM 8.1* 8.0*  MG 2.4 2.1  PHOS 3.4 2.8    Lipid Panel:     Component Value Date/Time   TRIG 378 (H) 12/31/2016 0415   HgbA1c: No results found for: HGBA1C Urine Drug Screen:     Component Value Date/Time   LABOPIA NONE DETECTED 12/25/2016 0913   COCAINSCRNUR NONE DETECTED 12/25/2016 0913   LABBENZ POSITIVE (A) 12/25/2016 0913   AMPHETMU NONE DETECTED 12/25/2016 0913   THCU NONE DETECTED 12/25/2016 0913   LABBARB NONE DETECTED 12/25/2016 0913    Alcohol Level No results found for: [place] I have  personally reviewed the radiological images below and agree with the radiology interpretations.  Ct Angio Head W Or Wo Contrast Ct Angio Neck W And/or Wo Contrast 12/25/2016  CTA NECK IMPRESSION:  Normal CTA of the neck.   CTA HEAD IMPRESSION:  1. 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 Aneurysm is directed posteriorly, and demonstrates a fairly narrow neck. Neuro endovascular consultation is recommended.  2. Diffuse irregularity throughout the remaining intracranial arterial vasculature, suspicious for vasospasm secondary to acute subarachnoid hemorrhage.    Cerbral Angiogram / Coiling - Dr Kathyrn Sheriff 12/25/2016 1. 7.5 x 6.87mm right Pcom aneurysm projecting posteriorly, coiled successfully without aneurysm residual. 2. No significant vasospasm seen 3. No other intracranial aneurysms, AVM, or fistulas seen   Ct Head Wo Contrast 12/25/2016 Diffuse subarachnoid hemorrhage, filling CSF spaces in the suprasellar and basal cisterns as well as bilateral sulci. Cause is not determined but presents high suspicion for ruptured intracranial aneurysm.   TTE - Left ventricle: The cavity size was normal. Systolic function was   normal. The estimated ejection fraction was in the range of 55%   to 60%. Wall motion was normal; there were no regional wall   motion abnormalities. Doppler parameters are consistent with   abnormal left ventricular relaxation (grade 1 diastolic   dysfunction). Acoustic contrast opacification revealed no   evidence ofthrombus.  TCD 12/26/16 -  no evidence of vasospasm  TCD 12/28/16 - elevated velocity at right MCA and ACA.  Mr Brain and C-spine Wo Contrast 12/26/2016 IMPRESSION: 1. 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 More confluent acute infarcts in the high frontoparietal regions bilaterally. 2. Subarachnoid and small volume intraventricular hemorrhage. 3. Mild cervical spondylosis.  No spinal stenosis. 4.  Unremarkable appearance of the cervical spinal cord.   MRI brain and C spine  12/30/2016 : 1. Overall similar appearance of scattered bilateral embolic infarcts and confluent cortical infarcts at the vertex bilaterally. 2. No new infarcts. 3. Decreasing subarachnoid and intraventricular hemorrhage. 4. Unremarkable appearance of the cervical spinal cord  PHYSICAL EXAM  Temp:  [99 F (37.2 C)-100.3 F (37.9 C)] 99.4 F (37.4 C) (05/28 1200) Pulse Rate:  [57-93] 78 (05/28 1300) Resp:  [10-24] 13 (05/28 1300) BP: (127-161)/(71-107) 152/102 (05/28 1300) SpO2:  [97 %-100 %] 100 % (05/28 1300) FiO2 (%):  [30 %] 30 % (05/28 0807) Weight:  [215 lb 2.7 oz (97.6 kg)] 215 lb 2.7 oz (97.6 kg) (05/28 0419)  General - Well nourished, well developed, intubated and sedated. Cannot decrease propofol due to agitation.   Ophthalmologic - Fundi not visualized due to noncooperation.  Cardiovascular - Regular rate and rhythm.  Neuro - intubated on fentanyl and propofol, eyes open and alert  Pupils pinpoint but reactive. right gaze preference, does not blink to threat on the left, positive corneal and gag. No movement on stim Left, can squeeze fingers on the right.  DTR diminished and equivocal toes. Nods yes to sensation on the right arm and leg but not on the left arm and leg..    ASSESSMENT/PLAN Ms. [name] Plan is a 41 y.o. female with history of hypertension and diabetes mellitus presenting with tetraplegia and altered mental status. She did not receive IV t-PA due to subarachnoid hemorrhage.  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  CTA neck - normal  Cerebral angio - no vasospasm.  TCD 12/28/16 increased velocity at right MCA and ACA  2D Echo - EF 55-60%  VTE prophylaxis - SCDs Diet NPO  time specified  No antithrombotic prior to admission, now on No antithrombotic secondary to [place].  S/p right PCOM coiling by Dr. 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  LP - Xanthochromia. No sign of infection  Still no good explanation of quadriplegia - may related to "brainstem shock" due to concentrated basal cistern blood product - expect pt symptom will improve over time if this is the case  Hypertension  Stable - IV Cardene  BP goal < 160  Long-term BP goal normotensive  Other Stroke Risk Factors  Obesity, Body mass index is 36.93 kg/m., recommend weight loss, diet and exercise as appropriate   Other Active Problems  Leukocytosis - 16.1 -> 23.6 -> 20.6 -> 18.5 -> 16.6 (temp 99.4 Ax) (on Decadron)  IV Rocephin / vancomycin   PLAN  Etiology of her significant quadriparesis remains unclear as brain imaging does not show  significant left hemispheric infarcts and theoretically spinal cord infarct is possible and may have been missed on the MRI of the C-spine but some clinical improvement in her right hand strength today is encouraging. Mobilize out of bed. Physical occupational, speech therapy and rehabilitation consults. Expect slow neurological improvement. Long discussion with the patient and family members at the bedside via using Spanish language interpreter and answered questions. Discussed with Dr. Kathyrn Sheriff.   Hospital day # 6  This patient is critically ill due to diffuse basal SAH, ruptured PCOM aneurysm, HTN, quadriplegia and at significant risk of neurological worsening, death form recurrent SAH, vasospasm, hydrocephalus, seizure. This patient's care requires  constant monitoring of vital signs, hemodynamics, respiratory and cardiac monitoring, of multiple databases, neurological assessment, discussion with family, other specialists and medical decision making of high complexity. I spent 35 minutes of neurocritical care time in the care of this patient.   Antony Contras, MD Medical Director [place] Pager: [phone] 12/31/2016 2:03 PM To contact Stroke Continuity provider, please refer to [email or web address] After hours, contact General Neurology

## 2017-01-01 ENCOUNTER — Encounter (HOSPITAL_COMMUNITY): Payer: Self-pay | Admitting: Neurosurgery

## 2017-01-01 LAB — CBC
HCT: 40.6 % (ref 36.0–46.0)
Hemoglobin: 13.2 g/dL (ref 12.0–15.0)
MCH: 27.7 pg (ref 26.0–34.0)
MCHC: 32.5 g/dL (ref 30.0–36.0)
MCV: 85.3 fL (ref 78.0–100.0)
Platelets: 249 10*3/uL (ref 150–400)
RBC: 4.76 MIL/uL (ref 3.87–5.11)
RDW: 15.6 % — ABNORMAL HIGH (ref 11.5–15.5)
WBC: 21.3 10*3/uL — ABNORMAL HIGH (ref 4.0–10.5)

## 2017-01-01 LAB — OSMOLALITY, URINE: Osmolality, Ur: 807 mosm/kg (ref 300–900)

## 2017-01-01 LAB — BASIC METABOLIC PANEL WITH GFR
Anion gap: 10 (ref 5–15)
BUN: 18 mg/dL (ref 6–20)
CO2: 25 mmol/L (ref 22–32)
Calcium: 8.2 mg/dL — ABNORMAL LOW (ref 8.9–10.3)
Chloride: 102 mmol/L (ref 101–111)
Creatinine, Ser: 0.46 mg/dL (ref 0.44–1.00)
GFR calc Af Amer: >60 mL/min (ref >60)
GFR calc non Af Amer: >60 mL/min (ref >60)
Glucose, Bld: 138 mg/dL — ABNORMAL HIGH (ref 65–99)
Potassium: 3.4 mmol/L — ABNORMAL LOW (ref 3.5–5.1)
Sodium: 137 mmol/L (ref 135–145)

## 2017-01-01 LAB — GLUCOSE, CAPILLARY
Glucose-Capillary: 145 mg/dL — ABNORMAL HIGH (ref 65–99)
Glucose-Capillary: 140 mg/dL — ABNORMAL HIGH (ref 65–99)
Glucose-Capillary: 185 mg/dL — ABNORMAL HIGH (ref 65–99)
Glucose-Capillary: 159 mg/dL — ABNORMAL HIGH (ref 65–99)
Glucose-Capillary: 176 mg/dL — ABNORMAL HIGH (ref 65–99)

## 2017-01-01 LAB — CULTURE, BLOOD (ROUTINE X 2)
Culture: NO GROWTH
Culture: NO GROWTH
Special Requests: ADEQUATE
Special Requests: ADEQUATE

## 2017-01-01 LAB — MAGNESIUM: Magnesium: 1.9 mg/dL (ref 1.7–2.4)

## 2017-01-01 LAB — SODIUM, URINE, RANDOM: Sodium, Ur: 122 mmol/L

## 2017-01-01 LAB — PHOSPHORUS: Phosphorus: 3.8 mg/dL (ref 2.5–4.6)

## 2017-01-01 MED ORDER — VITAL HIGH PROTEIN PO LIQD
1000.0000 mL | ORAL | Status: DC
  Administered 2017-01-01: 1000 mL
  Filled 2017-01-01: qty 1000

## 2017-01-01 MED ORDER — PRO-STAT SUGAR FREE PO LIQD: 30.0000 mL | Freq: Two times a day (BID) | ORAL | Status: DC

## 2017-01-01 MED ORDER — SODIUM CHLORIDE 0.9 % IV BOLUS (SEPSIS)
500.0000 mL | Freq: Once | INTRAVENOUS | Status: AC
  Administered 2017-01-01: 500 mL via INTRAVENOUS

## 2017-01-01 MED ORDER — PRO-STAT SUGAR FREE PO LIQD
30.0000 mL | Freq: Three times a day (TID) | ORAL | Status: DC
  Administered 2017-01-01 – 2017-01-03 (×3): 30 mL
  Filled 2017-01-01 (×3): qty 30

## 2017-01-01 MED ORDER — POTASSIUM CHLORIDE 20 MEQ/15ML (10%) PO SOLN
40.0000 meq | Freq: Once | ORAL | Status: AC
  Administered 2017-01-01: 40 meq
  Filled 2017-01-01: qty 30

## 2017-01-01 MED ORDER — CHLORHEXIDINE GLUCONATE 0.12 % MT SOLN
OROMUCOSAL | Status: AC
  Administered 2017-01-01: 15 mL
  Filled 2017-01-01: qty 15

## 2017-01-01 MED ORDER — JEVITY 1.2 CAL PO LIQD
1000.0000 mL | ORAL | Status: DC
  Administered 2017-01-01: 1000 mL
  Filled 2017-01-01 (×2): qty 1000

## 2017-01-01 NOTE — Progress Notes (Signed)
Nutrition Follow-up  DOCUMENTATION CODES:   Obesity unspecified  INTERVENTION:   Jevity 1.2 @ 50 ml/hr (1200 ml/day) 30 ml Prostat TID Provides: 1740 kcal, 111 grams protein, and 972 ml free water.   NUTRITION DIAGNOSIS:   Inadequate oral intake related to inability to eat as evidenced by NPO status. Ongoing.   GOAL:   Patient will meet greater than or equal to 90% of their needs Progressing.   MONITOR:   TF tolerance, I & O's  ASSESSMENT:   Pt with hx of DM admitted with [facility] s/p coiling.   5/28 extubated Medications reviewed and include: decadron, colace  Labs reviewed: K+3.4 CBG's: 140-185-159 Failed swallow eval, PT recommends CIR  Diet Order:  Diet NPO time specified  Skin:  Reviewed, no issues  Last BM:  5/29  Height:   Ht Readings from Last 1 Encounters:  12/25/16 5\' 4"  (1.626 m)    Weight:   Wt Readings from Last 1 Encounters:  01/01/17 207 lb 3.7 oz (94 kg)    Ideal Body Weight:  54.5 kg  BMI:  Body mass index is 35.57 kg/m.  Estimated Nutritional Needs:   Kcal:  1700-1900  Protein:  100-110 grams  Fluid:  > 1.7 L/day  EDUCATION NEEDS:   No education needs identified at this time  [place], [place], [place] Pager [phone] After Hours Pager

## 2017-01-01 NOTE — Progress Notes (Signed)
Orthopedic Tech Progress Note Patient Details:  Aimee [name] Plan 20-Jul-1976 [id number]  Ortho Devices Ortho Device/Splint Location: Bilateral heel boots Ortho Device/Splint Interventions: Application   [place] 01/01/2017, 2:30 PM

## 2017-01-01 NOTE — Progress Notes (Signed)
No issues overnight. Done well after extubation  EXAM:  BP (!) 145/88   Pulse 71   Temp 98.5 F (36.9 C) (Oral)   Resp 15   Ht 5\' 4"  (1.626 m)   Wt 94 kg (207 lb 3.7 oz)   LMP  (LMP Unknown) Comment: vented pt sah  SpO2 99%   BMI 35.57 kg/m   Awake, alert, oriented  Speech fluent CN grossly intact  Can squeeze hand on right No movement LUE/BLE  TCD not concerning for spasm  IMPRESSION:  41 y.o. female SAH d# 8 s/p right Pcom coiling. Remains densely quadriparetic. MRI brain/Cspine x2 have been unrevealing.  PLAN: - Will cont supportive care - [place], cont TCD monitoring

## 2017-01-01 NOTE — Progress Notes (Signed)
eLink Physician-Brief Progress Note Patient Name: Aimee [name] DOB: 1976/07/26 MRN: [id number]   Date of Service  01/01/2017  HPI/Events of Note  HR in 120s-140s Appears sinus tachycardia Pt is not febrile,not in pain or discomfort.  eICU Interventions  NS 500cc once.     Intervention Category Intermediate Interventions: Arrhythmia - evaluation and management  Felicidad Sugarman 01/01/2017, 5:23 AM

## 2017-01-01 NOTE — Progress Notes (Signed)
STROKE TEAM PROGRESS NOTE   SUBJECTIVE (INTERVAL HISTORY) Patient is alert and able to speak with soft voice and, following simple commands, alert, nodding yes/no, squeezing fingers on right not on the left.   OBJECTIVE Temp:  [98.2 F (36.8 C)-99.4 F (37.4 C)] 98.5 F (36.9 C) (05/29 0800) Pulse Rate:  [67-145] 71 (05/29 1000) Cardiac Rhythm: Normal sinus rhythm (05/29 0800) Resp:  [7-23] 15 (05/29 1000) BP: (126-166)/(82-113) 145/88 (05/29 1000) SpO2:  [96 %-100 %] 99 % (05/29 1000) Weight:  [207 lb 3.7 oz (94 kg)] 207 lb 3.7 oz (94 kg) (05/29 0500)  CBC:   Recent Labs Lab 12/28/16 0342 12/29/16 0430  12/31/16 0415 01/01/17 0400  WBC 20.6* 18.5*  < > 14.2* 21.3*  NEUTROABS 18.8* 16.3*  --   --   --   HGB 11.4* 11.9*  < > 11.1* 13.2  HCT 35.8* 37.6  < > 34.1* 40.6  MCV 86.3 87.0  < > 85.5 85.3  PLT 251 269  < > 222 249  < > = values in this interval not displayed.  Basic Metabolic Panel:   Recent Labs Lab 12/31/16 0415 01/01/17 0400  NA 141 137  K 3.6 3.4*  CL 109 102  CO2 24 25  GLUCOSE 160* 138*  BUN 22* 18  CREATININE 0.51 0.46  CALCIUM 8.0* 8.2*  MG 2.1 1.9  PHOS 2.8 3.8    Lipid Panel:     Component Value Date/Time   TRIG 378 (H) 12/31/2016 0415   HgbA1c: No results found for: HGBA1C Urine Drug Screen:     Component Value Date/Time   LABOPIA NONE DETECTED 12/25/2016 0913   COCAINSCRNUR NONE DETECTED 12/25/2016 0913   LABBENZ POSITIVE (A) 12/25/2016 0913   AMPHETMU NONE DETECTED 12/25/2016 0913   THCU NONE DETECTED 12/25/2016 0913   LABBARB NONE DETECTED 12/25/2016 0913    Alcohol Level No results found for: [place] I have personally reviewed the radiological images below and agree with the radiology interpretations.  Ct Angio Head W Or Wo Contrast Ct Angio Neck W And/or Wo Contrast 12/25/2016  CTA NECK IMPRESSION:  Normal CTA of the neck.   CTA HEAD IMPRESSION:  1. 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  Aneurysm is directed posteriorly, and demonstrates a fairly narrow neck. Neuro endovascular consultation is recommended.  2. Diffuse irregularity throughout the remaining intracranial arterial vasculature, suspicious for vasospasm secondary to acute subarachnoid hemorrhage.    Cerbral Angiogram / Coiling - Dr Kathyrn Sheriff 12/25/2016 1. 7.5 x 6.91mm right Pcom aneurysm projecting posteriorly, coiled successfully without aneurysm residual. 2. No significant vasospasm seen 3. No other intracranial aneurysms, AVM, or fistulas seen   Ct Head Wo Contrast 12/25/2016 Diffuse subarachnoid hemorrhage, filling CSF spaces in the suprasellar and basal cisterns as well as bilateral sulci. Cause is not determined but presents high suspicion for ruptured intracranial aneurysm.   TTE - Left ventricle: The cavity size was normal. Systolic function was   normal. The estimated ejection fraction was in the range of 55%   to 60%. Wall motion was normal; there were no regional wall   motion abnormalities. Doppler parameters are consistent with   abnormal left ventricular relaxation (grade 1 diastolic   dysfunction). Acoustic contrast opacification revealed no   evidence ofthrombus.  TCD 12/26/16 - no evidence of vasospasm  TCD 12/28/16 - elevated velocity at right MCA and ACA.  Mr Brain and C-spine Wo Contrast 12/26/2016 IMPRESSION: 1. Scattered acute embolic infarcts  in both cerebral hemispheres and left cerebellum. More confluent acute infarcts in the high frontoparietal regions bilaterally. 2. Subarachnoid and small volume intraventricular hemorrhage. 3. Mild cervical spondylosis.  No spinal stenosis. 4. Unremarkable appearance of the cervical spinal cord.   MRI brain and C spine  12/30/2016 : 1. Overall similar appearance of scattered bilateral embolic infarcts and confluent cortical infarcts at the vertex bilaterally. 2. No new infarcts. 3. Decreasing subarachnoid and intraventricular hemorrhage. 4.  Unremarkable appearance of the cervical spinal cord  Transcranial Doppler  Date POD PCO2 HCT BP  MCA ACA PCA OPHT SIPH VERT Basilar  5/23 rs     Right  Left   76  44   -52  -48   42  31   27  41   29  48   *  -37   -35      5/25 je     Right  Left   103  118   -95  -94   58  49   37  48   51  72   -35  *   -34      5/28 je     Right  Left   101  113   -53  -68   34  -36   33  42   56  72   -42  *   *       PHYSICAL EXAM  Temp:  [98.2 F (36.8 C)-99.4 F (37.4 C)] 98.5 F (36.9 C) (05/29 0800) Pulse Rate:  [67-145] 71 (05/29 1000) Resp:  [7-23] 15 (05/29 1000) BP: (126-166)/(82-113) 145/88 (05/29 1000) SpO2:  [96 %-100 %] 99 % (05/29 1000) Weight:  [207 lb 3.7 oz (94 kg)] 207 lb 3.7 oz (94 kg) (05/29 0500)  General - Well nourished, well developed, intubated and sedated. Cannot decrease propofol due to agitation.   Ophthalmologic - Fundi not visualized due to noncooperation.  Cardiovascular - Regular rate and rhythm.  Neuro - awake alert eyes open and alert  Soft hoarse voice and speaks short sentences.Pupils pinpoint but reactive. right gaze preference, does not blink to threat on the left, positive corneal and gag. No movement on stim Left, can squeeze fingers on the right.  DTR diminished and equivocal toes. Nods yes to sensation on the right arm and leg but not on the left arm and leg..    ASSESSMENT/PLAN Aimee [name] Plan is a 41 y.o. female with history of hypertension and diabetes mellitus presenting with tetraplegia and altered mental status. She did not receive IV t-PA due to subarachnoid hemorrhage.  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  CTA neck - normal  Cerebral angio - no vasospasm.  TCD  12/28/16 increased velocity at right MCA and ACA  2D Echo - EF 55-60%  VTE prophylaxis - SCDs Diet NPO time specified  No antithrombotic prior to admission, now on No antithrombotic secondary to [place].  S/p right PCOM coiling by Dr. Kathyrn Sheriff  On keppra for seizure prophylaxis  On nimodipine 60mg  Q4h  Ongoing aggressive stroke risk factor management  Therapy recommendations: pending  Disposition: Pending  Quadriplegia  LUE and LLE and RLE no movement, RUE 3/5  MRI brain and C-spine Scattered acute embolic infarcts in both cerebral hemispheres and left  cerebellum.  LP - Xanthochromia. No sign of infection  Still no good explanation of quadriplegia - may related to "brainstem shock" due to concentrated basal cistern blood product - expect pt symptom will improve over time if this is the case  Hypertension  Stable - IV Cardene  BP goal < 160  Long-term BP goal normotensive  Other Stroke Risk Factors  Obesity, Body mass index is 35.57 kg/m., recommend weight loss, diet and exercise as appropriate   Other Active Problems  Leukocytosis - 16.1 -> 23.6 -> 20.6 -> 18.5 -> 16.6 (temp 99.4 Ax) (on Decadron)  IV Rocephin / vancomycin   PLAN  Etiology of her significant quadriparesis remains unclear as brain imaging does not show significant left hemispheric infarcts and theoretically spinal cord infarct is possible and may have been missed on the MRI of the C-spine but some clinical improvement in her right hand strength  is encouraging. Mobilize out of bed. Physical occupational, speech therapy and rehabilitation consults. Expect slow neurological improvement. Long discussion with the patient   and answered questions. Discussed with Dr. Kathyrn Sheriff.   Hospital day # 7  This patient is critically ill due to diffuse basal SAH, ruptured PCOM aneurysm, HTN, quadriplegia and at significant risk of neurological worsening, death form recurrent SAH, vasospasm, hydrocephalus, seizure.  This patient's care requires constant monitoring of vital signs, hemodynamics, respiratory and cardiac monitoring, of multiple databases, neurological assessment, discussion with family, other specialists and medical decision making of high complexity. I spent 30 minutes of neurocritical care time in the care of this patient.   Antony Contras, MD Medical Director [facility] Pager: [phone] 01/01/2017 1:17 PM To contact Stroke Continuity provider, please refer to [email or web address] After hours, contact General Neurology

## 2017-01-01 NOTE — Progress Notes (Addendum)
PULMONARY / CRITICAL CARE MEDICINE   Name: Aimee [name] MRN: [id number] DOB: 25-Jan-1976    ADMISSION DATE:  12/25/2016 CONSULTATION DATE:  12/25/2016  REFERRING MD:  Dr. Dina Rich, EDP  CHIEF COMPLAINT:  AMS  HISTORY OF PRESENT ILLNESS: 41 , SAH, coil, vent  SUBJECTIVE Extubated and in no distress  VITAL SIGNS: BP (!) 145/88   Pulse 71   Temp 98.5 F (36.9 C) (Oral)   Resp 15   Ht '5\' 4"'  (1.626 m)   Wt 207 lb 3.7 oz (94 kg)   LMP  (LMP Unknown) Comment: vented pt sah  SpO2 99%   BMI 35.57 kg/m   HEMODYNAMICS:    VENTILATOR SETTINGS:    INTAKE / OUTPUT: I/O last 3 completed shifts: In: 5217.2 [I.V.:3284.7; NG/GT:517.5; IV Piggyback:1415] Out: 7000 [Urine:7000]  PHYSICAL EXAMINATION: General:  Obese female, follows commands on rt side ue HEENT: MM pink/moist PSY:Dul maffect Neuro: follows command srue CV: HSR RRR PULM: even/non-labored, lungs bilaterally clear BL:TJQZ, non-tender, bsx4 active  Extremities: warm/dry, + edema  Skin: no rashes or lesions   LABS:  BMET  Recent Labs Lab 12/30/16 0430 12/31/16 0415 01/01/17 0400  NA 144 141 137  K 4.2 3.6 3.4*  CL 113* 109 102  CO2 '25 24 25  ' BUN 27* 22* 18  CREATININE 0.59 0.51 0.46  GLUCOSE 154* 160* 138*   Electrolytes  Recent Labs Lab 12/30/16 0430 12/31/16 0415 01/01/17 0400  CALCIUM 8.1* 8.0* 8.2*  MG 2.4 2.1 1.9  PHOS 3.4 2.8 3.8   CBC  Recent Labs Lab 12/30/16 0430 12/31/16 0415 01/01/17 0400  WBC 16.6* 14.2* 21.3*  HGB 11.6* 11.1* 13.2  HCT 37.7 34.1* 40.6  PLT 248 222 249   Coag's No results for input(s): APTT, INR in the last 168 hours. Sepsis Markers No results for input(s): LATICACIDVEN, PROCALCITON, O2SATVEN in the last 168 hours.  ABG  Recent Labs Lab 12/29/16 0144 12/30/16 0440 12/31/16 0320  PHART 7.322* 7.386 7.499*  PCO2ART 48.8* 39.9 29.3*  PO2ART 83.0 96.7 82.5*   Liver Enzymes  Recent Labs Lab 12/28/16 0342  AST 46*  ALT 51  ALKPHOS 48   BILITOT 0.5  ALBUMIN 3.2*   Cardiac Enzymes No results for input(s): TROPONINI, PROBNP in the last 168 hours.  Glucose  Recent Labs Lab 12/31/16 2014 12/31/16 2200 12/31/16 2318 01/01/17 0413 01/01/17 0814 01/01/17 1115  GLUCAP 129* 219* 190* 145* 140* 185*   Imaging Dg Abd Portable 1v  Result Date: 12/31/2016 CLINICAL DATA:  Nasogastric tube placement EXAM: PORTABLE ABDOMEN - 1 VIEW COMPARISON:  Portable exam 1602 hours compared to 12/28/2016 FINDINGS: Nasogastric tube traverses stomach with tip projecting over expected position of the distal second portion of the duodenum. Gas filled upper normal caliber transverse colon. Visualized lung bases clear. IMPRESSION: Tip of nasogastric tube projects over expected position of the distal second portion of the duodenum. Electronically Signed   By: Lavonia Dana M.D.   On: 12/31/2016 15:32   STUDIES:  CT Head 5/22 > Diffuse subarachnoid hemorrhage, filling CSF spaces in the suprasellar and basal cisterns as well as bilateral sulci. 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 Aneurysm is directed posteriorly, and demonstrates a fairly narrow neck. Neuro endovascular consultation is recommended. 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 More confluent acute infarcts in the high frontoparietal regions bilaterally. 2. Subarachnoid and small volume intraventricular hemorrhage. 3. Mild cervical spondylosis.  No spinal stenosis. 4. Unremarkable appearance of the cervical spinal cord.  CULTURES: Csf 5/23>>> ng  ANTIBIOTICS: CTX 5/23>>> Vanc 5/23>>>5/28  SIGNIFICANT EVENTS: 5/22 > SAH, coil 5/23- not moving ext 5/24  LP  LINES/TUBES: ETT 5/22 >>5/28 5/22 rt IJ>>>  DISCUSSION: 41 year old female presents with N/V and Confusion. 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 Extubated 5/28.  ASSESSMENT / [name]:  PULMONARY A: Respiratory Insufficiency in setting of aneurysm and SAH Slight fluid fissure P:   Extubated 5/28 Titrate O2 for sat of 88-92% IS per RT protocol Flutter valve Ambulate OOB  CARDIOVASCULAR A:  HTN< SAH R/o endocarditis, mycotic aneurysm>? P:  Cardiac Monitoring  BP Goals met High risk vasospasm, keep pos balance, will defer to neuro Echo, no clot, grade I diastolic dysfunction  RENAL Lab Results  Component Value Date   CREATININE 0.46 01/01/2017   CREATININE 0.51 12/31/2016   CREATININE 0.59 12/30/2016    Recent Labs Lab 12/30/16 0430 12/31/16 0415 01/01/17 0400  K 4.2 3.6 3.4*     A:   Hypokalemia  Mild improved High risk vasospasm P:   Replete K+ Chem in am  Positive fluid balance per neuro  GASTROINTESTINAL A:   Dysphagia presumed P:   SLP remain NPO NPO PPI May need TF resumed  HEMATOLOGIC A:   SAH Mild anemia leukocytisis P:  SCD Coags WNL  INFECTIOUS A:   Leukocytosis slow resolving R/o endocarditis R/o mycotic Aneurysm LP unimpressive infectious source of any kind, appears as SAH P:   Trend Fever curve Empiric CTX BC pending  ENDOCRINE A:   DM    P:    SSI CBG  NEUROLOGIC  Intake/Output Summary (Last 24 hours) at 01/01/17 1228 Last data filed at 01/01/17 1000  Gross per 24 hour  Intake             2505 ml  Output             4100 ml  Net            -1595 ml   A:   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  limited ext movement not clear Emboli noted P:   RASS goal: -1/-2, prop needed Neurology following  Neurosurgery consult appreciated MRI per neuro TCD pos balance needed  FAMILY  - Updates: No family bedside  - Inter-disciplinary family meet or  Palliative Care meeting due by:  5/29  Richardson Landry Minor ACNP Maryanna Shape PCCM Pager [phone] till 3 pm If no answer page [phone] 01/01/2017, 12:27 PM   STAFF NOTE: I, Merrie Roof, MD FACP have personally reviewed patient's available data, including medical history, events of note, physical examination and test results as part of my evaluation. I have discussed with resident/NP and other care providers such as pharmacist, RN and RRT. In addition, I personally evaluated patient and elicited key findings of: alert, fc, weak left , mild some movement rt hand, cough okay, lungs clear, she is slight improved rt upper ext hope strength can improve over time, she was neg 1.6 liters last 24 hours, she is putting out high amount s urine, r/o compensated CSW vs iastrogenic, continued nimodipine, assess urine na, osm, keep saline, may need increase in this time period risk vasospasm, has dyshagia, failed slp, placed NGT, feed again  with strict asp risk, LP unimpressive infection, decadron per NS, keep MAP 100, may need additional therpay, needs PT, follow TCD, she has early foot drop, start boots   Lavon Paganini. Titus Mould, MD, [place] Pgr: [facility] 01/01/2017 1:30 PM

## 2017-01-01 NOTE — Progress Notes (Signed)
  Speech Language Pathology Treatment: Dysphagia  Patient Details Name: [name] Plan MRN: [id number] DOB: July 29, 1976 Today's Date: 01/01/2017 Time: 4967-5916 SLP Time Calculation (min) (ACUTE ONLY): 23 min  Assessment / Plan / Recommendation Clinical Impression  Pt continues to demonstrate significant dysphonia, no improvement overnight. Immediate signs of aspiration with nectar thick liquids. Pt has an NG tube and will benefit from more time post extubation prior to FEES for objective assessment of swallowing. Recommend ice chips intermittently with RN as tolerated, otherwise NPO.   HPI HPI: 41 year old female presents with N/V and Confusion. 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 Underwent coiling, Intubated from 5/22 to 5/28.       SLP Plan  Continue with current plan of care       Recommendations  Diet recommendations: NPO                Oral Care Recommendations: Oral care QID Follow up Recommendations: Inpatient Rehab SLP Visit Diagnosis: Dysphagia, oropharyngeal phase (R13.12) Plan: Continue with current plan of care       GO                Adileny Delon, Katherene Ponto 01/01/2017, 10:17 AM

## 2017-01-01 NOTE — Consult Note (Signed)
Physical Medicine and Rehabilitation Consult Reason for Consult: Decreased functional mobility Referring Physician: Dr. Kathyrn Sheriff  HPI: Aimee [name] is a 41 y.o. Hispanic right handed female with history of reported obesity, hypertension, diabetes mellitus. Per chart review patient lives with brother and sister. 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 Third level apartment with multiple stairs. Presented 12/25/2016 unresponsive with reported decerebrate posturing. Intubated in the ED. CT of the head reviewed, showing SAH.  Per report, diffuse subarachnoid hemorrhage filling CSF spaces in the suprasellar and basal cisterns as well as bilateral sulci. 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 Patient underwent coiling per interventional radiology. 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 Echocardiogram ejection fraction of 70% grade 1 diastolic dysfunction. Noted quadriparesis with ongoing follow-up per neurosurgery as well as neurology. Nasogastric tube in place for nutritional support. Physical and occupational therapy evaluations completed 01/01/2017 with recommendations of physical medicine rehabilitation consult.   Review of Systems  Constitutional: Negative for chills and fever.  Eyes: Positive for blurred vision.  Respiratory: Negative for cough and shortness of breath.   Cardiovascular: Positive for leg swelling. Negative for chest pain and palpitations.  Gastrointestinal: Positive for constipation. Negative for nausea and vomiting.  Genitourinary: Negative for dysuria and urgency.  Musculoskeletal: Positive for myalgias. Negative for falls.  Skin: Negative for rash.  Neurological: Positive for dizziness, focal weakness and headaches. Negative for sensory change and seizures.  All other systems reviewed and are negative.  Past Medical  History:  Diagnosis Date  . Diabetes mellitus without complication ([place])   . Hypertension    Past Surgical History:  Procedure Laterality Date  . IR ANGIO INTRA EXTRACRAN SEL INTERNAL CAROTID BILAT MOD SED  12/25/2016  . IR ANGIO VERTEBRAL SEL VERTEBRAL UNI L MOD SED  12/25/2016  . IR ANGIOGRAM FOLLOW UP STUDY  12/25/2016  . IR ANGIOGRAM FOLLOW UP STUDY  12/25/2016  . IR ANGIOGRAM FOLLOW UP STUDY  12/25/2016  . IR ANGIOGRAM FOLLOW UP STUDY  12/25/2016  . IR TRANSCATH/EMBOLIZ  12/25/2016  . RADIOLOGY WITH ANESTHESIA N/A 12/25/2016   Procedure: RADIOLOGY WITH ANESTHESIA;  Surgeon: Consuella Lose, MD;  Location: [facility];  Service: Radiology;  Laterality: N/A;   History reviewed. No pertinent family history of premature stroke.. Social History:  reports that she does not drink alcohol. Her tobacco and drug histories are not on file. Allergies: No Known Allergies Medications Prior to Admission  Medication Sig Dispense Refill  . metFORMIN (GLUCOPHAGE) 500 MG tablet Take 500 mg by mouth daily with breakfast.      Home: Home Living Living Arrangements: Other relatives  Lives With: Family  Functional History:   Functional Status:  Mobility:          ADL:    Cognition: Cognition Overall Cognitive Status: Impaired/Different from baseline Arousal/Alertness: Awake/alert Orientation Level: Oriented to person, Oriented to place, Oriented to time, Oriented to situation Attention: Focused, Sustained Focused Attention: Appears intact Sustained Attention: Impaired Sustained Attention Impairment: Verbal basic Memory: Appears intact Awareness: Impaired Awareness Impairment: Emergent impairment Problem Solving: Impaired Problem Solving Impairment: Verbal basic, Functional basic Executive Function: Initiating, Reasoning Reasoning: Impaired Reasoning Impairment: Verbal basic Initiating: Impaired Initiating Impairment: Verbal basic Cognition Overall Cognitive Status: Impaired/Different  from baseline  Blood pressure (!) 145/88, pulse 71, temperature 98.5 F (36.9 C), temperature source Oral, resp. rate 15, height 5\' 4"  (  1.626 m), weight 94 kg (207 lb 3.7 oz), SpO2 99 %. Physical Exam  Vitals reviewed. Constitutional: She appears well-developed.  Obese  HENT:  Head: Normocephalic and atraumatic.  +NG  Eyes: EOM are normal. Right eye exhibits no discharge. Left eye exhibits no discharge.  Pupils reactive to light  Neck: Normal range of motion. Neck supple. No thyromegaly present.  Cardiovascular: Normal rate and regular rhythm.   Respiratory: Effort normal and breath sounds normal. No respiratory distress.  GI: Soft. Bowel sounds are normal. She exhibits no distension.  Musculoskeletal: She exhibits no edema or tenderness.  Neurological: She is alert.  Makes eye contact with examiner.  Provides her name age and date of birth. Motor: RUE: 2+/5 hand grip, otherwise 0/5 throughout Sensation intact tol ight touch DTRs symmetric  Skin: Skin is warm and dry.  Psychiatric: She has a normal mood and affect. Her behavior is normal.    Results for orders placed or performed during the hospital encounter of 12/25/16 (from the past 24 hour(s))  Glucose, capillary     Status: Abnormal   Collection Time: 12/31/16  4:02 PM  Result Value Ref Range   Glucose-Capillary 150 (H) 65 - 99 mg/dL  Glucose, capillary     Status: Abnormal   Collection Time: 12/31/16  8:14 PM  Result Value Ref Range   Glucose-Capillary 129 (H) 65 - 99 mg/dL  Glucose, capillary     Status: Abnormal   Collection Time: 12/31/16 10:00 PM  Result Value Ref Range   Glucose-Capillary 219 (H) 65 - 99 mg/dL  Glucose, capillary     Status: Abnormal   Collection Time: 12/31/16 11:18 PM  Result Value Ref Range   Glucose-Capillary 190 (H) 65 - 99 mg/dL  CBC     Status: Abnormal   Collection Time: 01/01/17  4:00 AM  Result Value Ref Range   WBC 21.3 (H) 4.0 - 10.5 K/uL   RBC 4.76 3.87 - 5.11 MIL/uL    Hemoglobin 13.2 12.0 - 15.0 g/dL   HCT 40.6 36.0 - 46.0 %   MCV 85.3 78.0 - 100.0 fL   MCH 27.7 26.0 - 34.0 pg   MCHC 32.5 30.0 - 36.0 g/dL   RDW 15.6 (H) 11.5 - 15.5 %   Platelets 249 150 - 400 K/uL  Basic metabolic panel     Status: Abnormal   Collection Time: 01/01/17  4:00 AM  Result Value Ref Range   Sodium 137 135 - 145 mmol/L   Potassium 3.4 (L) 3.5 - 5.1 mmol/L   Chloride 102 101 - 111 mmol/L   CO2 25 22 - 32 mmol/L   Glucose, Bld 138 (H) 65 - 99 mg/dL   BUN 18 6 - 20 mg/dL   Creatinine, Ser 0.46 0.44 - 1.00 mg/dL   Calcium 8.2 (L) 8.9 - 10.3 mg/dL   GFR calc non Af Amer >60 >60 mL/min   GFR calc Af Amer >60 >60 mL/min   Anion gap 10 5 - 15  Magnesium     Status: None   Collection Time: 01/01/17  4:00 AM  Result Value Ref Range   Magnesium 1.9 1.7 - 2.4 mg/dL  Phosphorus     Status: None   Collection Time: 01/01/17  4:00 AM  Result Value Ref Range   Phosphorus 3.8 2.5 - 4.6 mg/dL  Glucose, capillary     Status: Abnormal   Collection Time: 01/01/17  4:13 AM  Result Value Ref Range   Glucose-Capillary 145 (H) 65 -  99 mg/dL  Glucose, capillary     Status: Abnormal   Collection Time: 01/01/17  8:14 AM  Result Value Ref Range   Glucose-Capillary 140 (H) 65 - 99 mg/dL  Glucose, capillary     Status: Abnormal   Collection Time: 01/01/17 11:15 AM  Result Value Ref Range   Glucose-Capillary 185 (H) 65 - 99 mg/dL   Mr Brain Wo Contrast  Result Date: 12/30/2016 CLINICAL DATA:  Quadriparesis. Recent subarachnoid hemorrhage and subsequent posterior communicating aneurysm coiling. EXAM: MRI HEAD WITHOUT CONTRAST MRI CERVICAL SPINE WITHOUT CONTRAST TECHNIQUE: Multiplanar, multiecho pulse sequences of the brain and surrounding structures, and cervical spine, to include the craniocervical junction and cervicothoracic junction, were obtained without intravenous contrast. COMPARISON:  12/26/2016 FINDINGS: MRI HEAD FINDINGS Brain: Scattered small embolic infarcts are again seen in the  right greater than left cerebral hemispheres and left cerebellum with more confluent regions of confluent cortically based restricted diffusion in the frontoparietal regions at the vertex bilaterally. A few of the embolic infarcts are at most minimally more prominent than on the prior MRI (such as one in the posterior limb of the right internal capsule), however no new infarcts are identified. Frontoparietal gyral edema at the vertex bilaterally is similar to the prior study. FLAIR hyperintensity throughout cerebral sulci bilaterally is less extensive than on the prior study and likely reflects partial clearing of subarachnoid hemorrhage, with artifactually increased signal from mechanical ventilation and hyperoxygenation also potentially contributing to the residual signal. Intraventricular blood products have also decreased, with a small amount persisting in the right greater than left occipital horns. There is no ventricular dilatation. No mass, midline shift, or extra-axial fluid collection is seen. Vascular: Major intracranial vascular flow voids are preserved. Right posterior communicating aneurysm coiling. Skull and upper cervical spine: Unremarkable bone marrow signal. Skull hyperostosis. Sinuses/Orbits: Unremarkable orbits. Slightly decreased left maxillary sinus opacification by complex material. Clear mastoid air cells. Fluid in the pharynx. Other: None. MRI CERVICAL SPINE FINDINGS Alignment: Normal. Vertebrae: No fracture or evidence of discitis. Unchanged subcentimeter C6 vertebral body lesion, likely benign. Cord: Normal signal and morphology. Posterior Fossa, vertebral arteries, paraspinal tissues: Partially visualized endotracheal and enteric tubes with moderate volume fluid in the pharynx. Disc levels: Unchanged mild cervical spondylosis including a small central disc protrusion at C4-5 and tiny central disc protrusions at C3-4 and C5-6 without significant spinal stenosis or spinal cord mass effect.  IMPRESSION: 1. Overall similar appearance of scattered bilateral embolic infarcts and confluent cortical infarcts at the vertex bilaterally. 2. No new infarcts. 3. Decreasing subarachnoid and intraventricular hemorrhage. 4. Unremarkable appearance of the cervical spinal cord. Electronically Signed   By: Logan Bores M.D.   On: 12/30/2016 17:59   Mr Cervical Spine Wo Contrast  Result Date: 12/30/2016 CLINICAL DATA:  Quadriparesis. Recent subarachnoid hemorrhage and subsequent posterior communicating aneurysm coiling. EXAM: MRI HEAD WITHOUT CONTRAST MRI CERVICAL SPINE WITHOUT CONTRAST TECHNIQUE: Multiplanar, multiecho pulse sequences of the brain and surrounding structures, and cervical spine, to include the craniocervical junction and cervicothoracic junction, were obtained without intravenous contrast. COMPARISON:  12/26/2016 FINDINGS: MRI HEAD FINDINGS Brain: Scattered small embolic infarcts are again seen in the right greater than left cerebral hemispheres and left cerebellum with more confluent regions of confluent cortically based restricted diffusion in the frontoparietal regions at the vertex bilaterally. A few of the embolic infarcts are at most minimally more prominent than on the prior MRI (such as one in the posterior limb of the right internal capsule), however no new infarcts  are identified. Frontoparietal gyral edema at the vertex bilaterally is similar to the prior study. FLAIR hyperintensity throughout cerebral sulci bilaterally is less extensive than on the prior study and likely reflects partial clearing of subarachnoid hemorrhage, with artifactually increased signal from mechanical ventilation and hyperoxygenation also potentially contributing to the residual signal. Intraventricular blood products have also decreased, with a small amount persisting in the right greater than left occipital horns. There is no ventricular dilatation. No mass, midline shift, or extra-axial fluid collection is  seen. Vascular: Major intracranial vascular flow voids are preserved. Right posterior communicating aneurysm coiling. Skull and upper cervical spine: Unremarkable bone marrow signal. Skull hyperostosis. Sinuses/Orbits: Unremarkable orbits. Slightly decreased left maxillary sinus opacification by complex material. Clear mastoid air cells. Fluid in the pharynx. Other: None. MRI CERVICAL SPINE FINDINGS Alignment: Normal. Vertebrae: No fracture or evidence of discitis. Unchanged subcentimeter C6 vertebral body lesion, likely benign. Cord: Normal signal and morphology. Posterior Fossa, vertebral arteries, paraspinal tissues: Partially visualized endotracheal and enteric tubes with moderate volume fluid in the pharynx. Disc levels: Unchanged mild cervical spondylosis including a small central disc protrusion at C4-5 and tiny central disc protrusions at C3-4 and C5-6 without significant spinal stenosis or spinal cord mass effect. IMPRESSION: 1. Overall similar appearance of scattered bilateral embolic infarcts and confluent cortical infarcts at the vertex bilaterally. 2. No new infarcts. 3. Decreasing subarachnoid and intraventricular hemorrhage. 4. Unremarkable appearance of the cervical spinal cord. Electronically Signed   By: Logan Bores M.D.   On: 12/30/2016 17:59   Dg Chest Port 1 View  Result Date: 12/31/2016 CLINICAL DATA:  Check endotracheal tube placement EXAM: PORTABLE CHEST 1 VIEW COMPARISON:  12/30/2016 FINDINGS: Cardiac shadow is stable in appearance. Central venous line, endotracheal tube and nasogastric catheter are again noted and stable. The overall inspiratory effort is again poor without focal infiltrate or sizable effusion. IMPRESSION: Poor inspiratory effort. Tubes and lines as described. Electronically Signed   By: Inez Catalina M.D.   On: 12/31/2016 08:20   Dg Abd Portable 1v  Result Date: 12/31/2016 CLINICAL DATA:  Nasogastric tube placement EXAM: PORTABLE ABDOMEN - 1 VIEW COMPARISON:   Portable exam 1602 hours compared to 12/28/2016 FINDINGS: Nasogastric tube traverses stomach with tip projecting over expected position of the distal second portion of the duodenum. Gas filled upper normal caliber transverse colon. Visualized lung bases clear. IMPRESSION: Tip of nasogastric tube projects over expected position of the distal second portion of the duodenum. Electronically Signed   By: Lavonia Dana M.D.   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  Records reviewed and summated above. Stroke: Continue secondary stroke prophylaxis and Risk Factor Modification listed below:   Blood Pressure Management:  Continue current medication with prn's with permisive HTN per primary team B/l sided hemiparesis: fit for orthosis to prevent contractures (resting hand splint for day, wrist cock up splint at night, PRAFO, etc) Motor recovery: Fluoxetine  1. Does the need for close, 24 hr/day medical supervision in concert with the patient's rehab needs make it unreasonable for this patient to be served in a less intensive setting? Yes 2. Co-Morbidities requiring supervision/potential complications: obesity Body mass index is 34.81 kg/m., diet and exercise education, encourage weight loss to increase endurance and promote overall health), HTN (monitor and provide prns in accordance with increased physical exertion and pain), diabetes mellitus (Monitor in accordance with exercise and adjust meds as necessary),  leukocytosis (likely steroid induced, cont to monitor for signs and symptoms of infection, further workup if indicated) 3. Due to bladder management, bowel management, safety, skin/wound care, disease management, medication administration and patient education, does the patient require 24 hr/day rehab nursing? Yes 4. Does the patient require coordinated care of a  physician, rehab nurse, PT (1-2 hrs/day, 5 days/week), OT (1-2 hrs/day, 5 days/week) and SLP (1-2 hrs/day, 5 days/week) to address physical and functional deficits in the context of the above medical diagnosis(es)? Yes Addressing deficits in the following areas: balance, endurance, locomotion, strength, transferring, bowel/bladder control, bathing, dressing, feeding, grooming, toileting, cognition, speech, language, swallowing and psychosocial support 5. Can the patient actively participate in an intensive therapy program of at least 3 hrs of therapy per day at least 5 days per week? Yes 6. The potential for patient to make measurable gains while on inpatient rehab is good and fair 7. Anticipated functional outcomes upon discharge from inpatient rehab are max assist and total assist  with PT, max assist and total assist with OT, supervision and min assist with SLP. 8. Estimated rehab length of stay to reach the above functional goals is: 25-30 days. 9. Anticipated D/C setting: Other 10. Anticipated post D/C treatments: SNF 11. Overall Rehab/Functional Prognosis: fair  RECOMMENDATIONS: This patient's condition is appropriate for continued rehabilitative care in the following setting: Will consider CIR to decrease burden of care. Patient has agreed to participate in recommended program. Potentially Note that insurance prior authorization may be required for reimbursement for recommended care.  Comment: Rehab Admissions Coordinator to follow up.  Delice Lesch, MD, Mellody Drown Cathlyn Parsons., PA-C 01/01/2017

## 2017-01-01 NOTE — Evaluation (Signed)
Speech Language Pathology Evaluation Patient Details Name: Aimee [name] MRN: [id number] DOB: 30-Jun-1976 Today's Date: 01/01/2017 Time: 9030-0923 SLP Time Calculation (min) (ACUTE ONLY): 25 min  Problem List:  Patient Active Problem List   Diagnosis Date Noted  . Acute encephalopathy   . Somnolence   . Seizure ([place])   . Stupor   . Ventilator dependent ([place])   . SAH (subarachnoid hemorrhage) ([place]) 12/25/2016  . Subarachnoid hemorrhage due to ruptured aneurysm ([place]) 12/25/2016   Past Medical History:  Past Medical History:  Diagnosis Date  . Diabetes mellitus without complication ([place])   . Hypertension    Past Surgical History:  Past Surgical History:  Procedure Laterality Date  . RADIOLOGY WITH ANESTHESIA N/A 12/25/2016   Procedure: RADIOLOGY WITH ANESTHESIA;  Surgeon: Consuella Lose, MD;  Location: [facility];  Service: Radiology;  Laterality: N/A;   HPI:  41 year old female presents with N/V and Confusion. 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 Underwent coiling, Intubated from 5/22 to 5/28.    Assessment / [name] / Recommendation Clinical Impression  Pt demonstrates mild to moderate cognitive impairment (difficult to assess severity given pts physical level of function and dysphonia). She is alert, attentitive with language function WNL in Spanish with appropriate affect, slightly slower processing speed in [place] with a flat staring behavior (likely due to increased cognitive load with second language). Pt takes extra time to respond in [place]. Pt does demonstrate difficulty with verbal and functional processing and reasoning tasks, though attentiona dn memory seem appropraite. Will need to conduct higher level diagnostic testing as pts endurance improves. Recommend CIR at next level of care given good potential for recovery with intensive intervention. WIll follow for readiness.     SLP Assessment  SLP Recommendation/Assessment:  Patient needs continued Speech Lanaguage Pathology Services SLP Visit Diagnosis: Frontal lobe and executive function deficit Frontal lobe and executive function deficit following: Cerebral infarction    Follow Up Recommendations  Inpatient Rehab    Frequency and Duration min 2x/week  2 weeks      SLP Evaluation Cognition  Overall Cognitive Status: Impaired/Different from baseline Arousal/Alertness: Awake/alert Orientation Level: Oriented to person;Oriented to place;Oriented to time;Oriented to situation Attention: Focused;Sustained Focused Attention: Appears intact Sustained Attention: Impaired Sustained Attention Impairment: Verbal basic Memory: Appears intact Awareness: Impaired Awareness Impairment: Emergent impairment Problem Solving: Impaired Problem Solving Impairment: Verbal basic;Functional basic Executive Function: Initiating;Reasoning Reasoning: Impaired Reasoning Impairment: Verbal basic Initiating: Impaired Initiating Impairment: Verbal basic       Comprehension  Auditory Comprehension Overall Auditory Comprehension: Appears within functional limits for tasks assessed    Expression Verbal Expression Overall Verbal Expression: Appears within functional limits for tasks assessed   Oral / Motor  Oral Motor/Sensory Function Overall Oral Motor/Sensory Function: Mild impairment Facial ROM: Within Functional Limits Facial Symmetry: Within Functional Limits Facial Strength: Within Functional Limits Facial Sensation: Within Functional Limits Lingual ROM: Reduced left Lingual Symmetry: Abnormal symmetry left Lingual Strength: Reduced Motor Speech Overall Motor Speech: Appears within functional limits for tasks assessed   GO                   Herbie Baltimore, MA CCC-SLP [phone]  Lynann Beaver 01/01/2017, 10:28 AM

## 2017-01-01 NOTE — Evaluation (Signed)
Physical Therapy Evaluation Patient Details Name: Aimee [name] Plan MRN: [id number] DOB: November 05, 1975 Today's Date: 01/01/2017   History of Present Illness  Patient is a 41 y/o female presents from home with N/V, confusion. Head CT-diffuse SAH. 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 More confluent acute infarcts in the high frontoparietal regions bilaterally. Intubated 5/22-5/28.  Clinical Impression  Patient presents like a quadriplegic with only minimal movement RUE, trace activity in quadriceps, left inattention, decreased problem solving/attention and impaired mobility s/p above. Pt with intact noxious stimulus and light touch BLEs, UEs and trunk. Tolerated sitting EOB ~20 mins with total A after wrapping BLEs with ace wraps. BP remained stable in higher range of normal (per orders) despite dizziness reported which resolved. RN aware. 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 Will follow acutely to maximize independence and mobility prior to return home.      Follow Up Recommendations CIR;Supervision for mobility/OOB;Supervision/Assistance - 24 hour    Equipment Recommendations  Other (comment) (TBA)    Recommendations for Other Services Rehab consult     Precautions / Restrictions Precautions Precautions: Fall Precaution Comments: quadriplegia; keep MAP 100; PRAFOs Restrictions Weight Bearing Restrictions: No      Mobility  Bed Mobility Overal bed mobility: Needs Assistance Bed Mobility: Supine to Sit     Supine to sit: Total assist;+2 for physical assistance;HOB elevated     General bed mobility comments: Total A of 2 using helicoptor technique to get to EOB and return to supine. Dizziness reported.   Transfers                    Ambulation/Gait                Stairs            Wheelchair  Mobility    Modified Rankin (Stroke Patients Only) Modified Rankin (Stroke Patients Only) Pre-Morbid Rankin Score: No symptoms Modified Rankin: Severe disability     Balance Overall balance assessment: Needs assistance Sitting-balance support: Feet supported;No upper extremity supported Sitting balance-Leahy Scale: Zero Sitting balance - Comments: Total A to sit EOB ~20 mins supported on therapist. Bp stabilized, on the higher side >409 systolic and >811 diastolic. RN aware.  Postural control: Posterior lean                                   Pertinent Vitals/Pain Pain Assessment: Faces Faces Pain Scale: Hurts a little bit Pain Location: neck Pain Descriptors / Indicators: Sore Pain Intervention(s): Monitored during session;Repositioned    Home Living Family/patient expects to be discharged to:: Inpatient rehab Living Arrangements: Other relatives (brother and sister) Available Help at Discharge: Family;Available PRN/intermittently Type of Home: Apartment Home Access: Stairs to enter   Entrance Stairs-Number of Steps: 3rd level apt with 3 flights of stairs Home Layout: One level Home Equipment: None      Prior Function Level of Independence: Independent         Comments: Works as Retail buyer.     Hand Dominance   Dominant Hand: Right    Extremity/Trunk Assessment   Upper Extremity Assessment Upper Extremity Assessment: Defer to OT evaluation (Some trace in left triceps once. Weak grip right hand.)    Lower Extremity Assessment Lower Extremity Assessment: LLE deficits/detail;RLE deficits/detail RLE Deficits / Details: Grossly ~  0/5 throughout except 1/5 quads x2; responds to noxious stimulus RLE Sensation:  (WFL) LLE Deficits / Details: Grossly ~0/5 throughout except 1/5 quads x2; responds to noxious stimulus  LLE Sensation:  (WFL)    Cervical / Trunk Assessment Cervical / Trunk Assessment: Other exceptions Cervical / Trunk Exceptions: Able to  extend cervical spine to hold into neutral but fatigues. Not able to shrug shoulders.   Communication   Communication:  (raspy low toned voice)  Cognition Arousal/Alertness: Awake/alert Behavior During Therapy: WFL for tasks assessed/performed Overall Cognitive Status: Impaired/Different from baseline Area of Impairment: Problem solving;Following commands;Attention                   Current Attention Level: Sustained   Following Commands: Follows one step commands with increased time     Problem Solving: Slow processing;Requires verbal cues;Decreased initiation General Comments: Slow processing and delayed response time to respond. Demonstrates some left inattention.       General Comments General comments (skin integrity, edema, etc.): BP ranged from 160s, to 130s/100s throughout.    Exercises Other Exercises Other Exercises: PROM all extremities UE and LE.   Assessment/Plan    PT Assessment Patient needs continued PT services  PT Problem List Decreased strength;Decreased mobility;Impaired tone;Obesity;Decreased range of motion;Decreased activity tolerance;Decreased cognition;Pain;Decreased balance       PT Treatment Interventions Therapeutic activities;Cognitive remediation;Patient/family education;Therapeutic exercise;Functional mobility training;Balance training;Gait training;Neuromuscular re-education;Wheelchair mobility training    PT Goals (Current goals can be found in the Care Plan section)  Acute Rehab PT Goals Patient Stated Goal: none stated PT Goal Formulation: With patient Time For Goal Achievement: 01/15/17 Potential to Achieve Goals: Fair    Frequency Min 3X/week   Barriers to discharge Decreased caregiver support;Inaccessible home environment      Co-evaluation PT/OT/SLP Co-Evaluation/Treatment: Yes Reason for Co-Treatment: For patient/therapist safety;To address functional/ADL transfers;Complexity of the patient's impairments (multi-system  involvement) PT goals addressed during session: Mobility/safety with mobility;Balance         AM-PAC PT "6 Clicks" Daily Activity  Outcome Measure Difficulty turning over in bed (including adjusting bedclothes, sheets and blankets)?: Total Difficulty moving from lying on back to sitting on the side of the bed? : Total Difficulty sitting down on and standing up from a chair with arms (e.g., wheelchair, bedside commode, etc,.)?: Total Help needed moving to and from a bed to chair (including a wheelchair)?: Total Help needed walking in hospital room?: Total Help needed climbing 3-5 steps with a railing? : Total 6 Click Score: 6    End of Session   Activity Tolerance: Patient tolerated treatment well Patient left: in bed;with call bell/phone within reach;with SCD's reapplied;Other (comment) (PRAFOs) Nurse Communication: Mobility status;Need for lift equipment PT Visit Diagnosis: Hemiplegia and hemiparesis Hemiplegia - Right/Left:  (quadriplegic) Hemiplegia - caused by: Nontraumatic SAH;Cerebral infarction    Time: 6294-7654 PT Time Calculation (min) (ACUTE ONLY): 42 min   Charges:   PT Evaluation $PT Eval Moderate Complexity: 1 Procedure PT Treatments $Therapeutic Activity: 8-22 mins   PT G Codes:        Wray Kearns, PT, DPT [phone]    [place] 01/01/2017, 3:31 PM

## 2017-01-01 NOTE — Evaluation (Signed)
Occupational Therapy Evaluation Patient Details Name: Aimee [name] MRN: [id number] DOB: 10-13-1975 Today's Date: 01/01/2017    History of Present Illness Patient is a 41 y/o female presents from home with N/V, confusion. Head CT-diffuse SAH. 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 More confluent acute infarcts in the high frontoparietal regions bilaterally. Intubated 5/22-5/28.   Clinical Impression   PTA, pt reports independence with ADL and functional mobility. She was working and living with her brother and sister. Pt currently with quadriplegic type presentation with minimal R UE movement, trace L UE bicep and tricep movement, and trace B quadricep activation. She additionally presents with decreased attention to L side of body and visual field, impaired sensation for proprioception in B UE, slow processing, decreased attention, and problem solving limiting ability to participate in ADL. She currently requires total assistance for all aspects of ADL. Able to tolerate sitting at EOB in preparation for ADL participation for 20 minutes after B LE's wrapped with Ace wrap. BP stable but at high range of MD stated parameters throughout. Pt demonstrates significant decline from PLOF and would benefit from CIR level therapies post-acute D/C in order to maximize independence with ADL and return to PLOF. OT will continue to follow while admitted to improve independence with ADL and functional mobility.     Follow Up Recommendations  CIR;Supervision/Assistance - 24 hour    Equipment Recommendations  Other (comment) (TBD at next venue of care)    Recommendations for Other Services Rehab consult     Precautions / Restrictions Precautions Precautions: Fall Precaution Comments: quadriplegia; keep MAP 100; PRAFOs Restrictions Weight Bearing  Restrictions: No      Mobility Bed Mobility Overal bed mobility: Needs Assistance Bed Mobility: Supine to Sit;Sit to Supine     Supine to sit: Total assist;+2 for physical assistance;HOB elevated Sit to supine: Total assist;+2 for physical assistance   General bed mobility comments: Total A of 2 using helicoptor technique to get to EOB and return to supine. Dizziness reported.   Transfers                      Balance Overall balance assessment: Needs assistance Sitting-balance support: Feet supported;No upper extremity supported Sitting balance-Leahy Scale: Zero Sitting balance - Comments: Total A to sit EOB ~20 mins supported on therapist. Bp stabilized, on the higher side >841 systolic and >324 diastolic. RN aware.  Postural control: Posterior lean                                 ADL either performed or assessed with clinical judgement   ADL Overall ADL's : Needs assistance/impaired                                       General ADL Comments: Total assist with all aspects of ADL at this time.      Vision   Vision Assessment?: Yes Eye Alignment:  (Dysconjugate at times) Ocular Range of Motion: Within Functional Limits Alignment/Gaze Preference: Head tilt (head tilt to R) Tracking/Visual Pursuits: Impaired - to be further tested in functional context Additional Comments: Difficulty tracking during session and unsure if due to cognitive or visual deficits. Fields initially appear in tact but will  need to complete further assessment.      Perception     Praxis      Pertinent Vitals/Pain Pain Assessment: Faces Faces Pain Scale: Hurts a little bit Pain Location: neck Pain Descriptors / Indicators: Sore Pain Intervention(s): Monitored during session;Repositioned     Hand Dominance Right   Extremity/Trunk Assessment Upper Extremity Assessment Upper Extremity Assessment: RUE deficits/detail;LUE deficits/detail RUE Deficits /  Details: Overflow movement in R hand when attempting L UE movement. Grasp strength 4-/5. 2-/5 bicep and no tricep activation on eval. Unable to complete scapular elevation. Pain sensation in tact with delayed response.  RUE Sensation: decreased proprioception LUE Deficits / Details: Trace (1/5) bicep and tricep activation with increased time. Unable to complete scapular elevation. Pain sensation in tact; proprioception sensation deficits.  LUE Sensation: decreased proprioception   Lower Extremity Assessment Lower Extremity Assessment: Defer to PT evaluation RLE Deficits / Details: Grossly ~0/5 throughout except 1/5 quads x2; responds to noxious stimulus RLE Sensation:  (WFL) LLE Deficits / Details: Grossly ~0/5 throughout except 1/5 quads x2; responds to noxious stimulus  LLE Sensation:  (WFL)   Cervical / Trunk Assessment Cervical / Trunk Assessment: Other exceptions Cervical / Trunk Exceptions: Able to extend cervical spine to hold into neutral but fatigues. Not able to shrug shoulders.    Communication Communication Communication:  (raspy low toned voice)   Cognition Arousal/Alertness: Awake/alert Behavior During Therapy: WFL for tasks assessed/performed Overall Cognitive Status: Impaired/Different from baseline Area of Impairment: Problem solving;Following commands;Attention                   Current Attention Level: Sustained   Following Commands: Follows one step commands with increased time     Problem Solving: Slow processing;Requires verbal cues;Decreased initiation General Comments: Slow processing and delayed response time to respond. Demonstrates some left inattention.    General Comments  BP ranged from 160s to 130s/100s throughout    Exercises Other Exercises Other Exercises: PROM all extremities UE and LE.   Shoulder Instructions      Home Living Family/patient expects to be discharged to:: Inpatient rehab Living Arrangements: Other relatives (brother  and sister) Available Help at Discharge: Family;Available PRN/intermittently Type of Home: Apartment Home Access: Stairs to enter [employer] of Steps: 3rd level apt with 3 flights of stairs   Home Layout: One level         Bathroom Toilet: Standard     Home Equipment: None          Prior Functioning/Environment Level of Independence: Independent        Comments: Works as Retail buyer.        OT Problem List: Decreased strength;Decreased range of motion;Decreased activity tolerance;Impaired balance (sitting and/or standing);Impaired vision/perception;Decreased cognition;Decreased safety awareness;Decreased knowledge of use of DME or AE;Decreased knowledge of precautions;Impaired sensation;Cardiopulmonary status limiting activity;Pain;Impaired UE functional use      OT Treatment/Interventions: Self-care/ADL training;Therapeutic exercise;Energy conservation;DME and/or AE instruction;Therapeutic activities;Cognitive remediation/compensation;Visual/perceptual remediation/compensation;Patient/family education;Balance training;Neuromuscular education    OT Goals(Current goals can be found in the care [name] section) Acute Rehab OT Goals Patient Stated Goal: none stated OT Goal Formulation: With patient Time For Goal Achievement: 01/15/17 Potential to Achieve Goals: Good ADL Goals Pt Will Perform Grooming: with max assist;sitting (with back support) Pt/caregiver will Perform Home Exercise Program: Both right and left upper extremity;Increased ROM;With written HEP provided;With Supervision (AAROM/PROM) Additional ADL Goal #1: Pt will complete bed mobility with maximum assistance in preparation for ADL tasks seated at EOB.  Additional ADL Goal #  2: Pt will tolerate sitting at EOB with max assist for approximately 20 minutes for seated ADL tasks with stable vital signs.  Additional ADL Goal #3: Pt will demonstrate ability to direct family/staff in completing of bathing tasks with  increased time for processing and no more than 2 VC's. Additional ADL Goal #4: Pt will demonstrate selective attention during ADL tasks for 10 minutes in a non-distracting environment.   OT Frequency: Min 3X/week   Barriers to D/C:            Co-evaluation PT/OT/SLP Co-Evaluation/Treatment: Yes Reason for Co-Treatment: Complexity of the patient's impairments (multi-system involvement);For patient/therapist safety;To address functional/ADL transfers PT goals addressed during session: Mobility/safety with mobility;Balance OT goals addressed during session: ADL's and self-care      AM-PAC PT "6 Clicks" Daily Activity     Outcome Measure Help from another person eating meals?: Total Help from another person taking care of personal grooming?: Total Help from another person toileting, which includes using toliet, bedpan, or urinal?: Total Help from another person bathing (including washing, rinsing, drying)?: Total Help from another person to put on and taking off regular upper body clothing?: Total Help from another person to put on and taking off regular lower body clothing?: Total 6 Click Score: 6   End of Session Equipment Utilized During Treatment:  (B LE's wrapped with ACE wrap; PRAFOs reapplied at end ) Nurse Communication: Mobility status  Activity Tolerance: Patient tolerated treatment well Patient left: in bed;with call bell/phone within reach;with nursing/sitter in room  OT Visit Diagnosis: Cognitive communication deficit (R41.841);Muscle weakness (generalized) (M62.81);Other symptoms and signs involving the nervous system (R29.898) Symptoms and signs involving cognitive functions: Cerebral infarction                Time: 8466-5993 OT Time Calculation (min): 42 min Charges:  OT General Charges $OT Visit: 1 Procedure OT Evaluation $OT Eval High Complexity: 1 Procedure G-Codes:     Norman Herrlich, MS OTR/L  Pager: Aimee [name] 01/01/2017, 5:34 PM

## 2017-01-02 ENCOUNTER — Inpatient Hospital Stay (HOSPITAL_COMMUNITY): Payer: Medicaid Other

## 2017-01-02 DIAGNOSIS — G8220 Paraplegia, unspecified: Secondary | ICD-10-CM

## 2017-01-02 DIAGNOSIS — E1169 Type 2 diabetes mellitus with other specified complication: Secondary | ICD-10-CM

## 2017-01-02 DIAGNOSIS — E6609 Other obesity due to excess calories: Secondary | ICD-10-CM

## 2017-01-02 DIAGNOSIS — E66811 Other obesity due to excess calories: Secondary | ICD-10-CM

## 2017-01-02 DIAGNOSIS — Z6830 Body mass index (BMI) 30.0-30.9, adult: Secondary | ICD-10-CM

## 2017-01-02 DIAGNOSIS — E669 Obesity, unspecified: Secondary | ICD-10-CM

## 2017-01-02 DIAGNOSIS — I10 Essential (primary) hypertension: Secondary | ICD-10-CM

## 2017-01-02 DIAGNOSIS — G8250 Quadriplegia, unspecified: Secondary | ICD-10-CM

## 2017-01-02 DIAGNOSIS — D72829 Elevated white blood cell count, unspecified: Secondary | ICD-10-CM

## 2017-01-02 DIAGNOSIS — R569 Unspecified convulsions: Secondary | ICD-10-CM

## 2017-01-02 LAB — GLUCOSE, CAPILLARY
Glucose-Capillary: 129 mg/dL — ABNORMAL HIGH (ref 65–99)
Glucose-Capillary: 169 mg/dL — ABNORMAL HIGH (ref 65–99)
Glucose-Capillary: 171 mg/dL — ABNORMAL HIGH (ref 65–99)
Glucose-Capillary: 172 mg/dL — ABNORMAL HIGH (ref 65–99)
Glucose-Capillary: 216 mg/dL — ABNORMAL HIGH (ref 65–99)
Glucose-Capillary: 241 mg/dL — ABNORMAL HIGH (ref 65–99)

## 2017-01-02 LAB — BASIC METABOLIC PANEL WITH GFR
Anion gap: 8 (ref 5–15)
BUN: 22 mg/dL — ABNORMAL HIGH (ref 6–20)
CO2: 22 mmol/L (ref 22–32)
Calcium: 8.2 mg/dL — ABNORMAL LOW (ref 8.9–10.3)
Chloride: 106 mmol/L (ref 101–111)
Creatinine, Ser: 0.5 mg/dL (ref 0.44–1.00)
GFR calc Af Amer: >60 mL/min (ref >60)
GFR calc non Af Amer: >60 mL/min (ref >60)
Glucose, Bld: 189 mg/dL — ABNORMAL HIGH (ref 65–99)
Potassium: 3.8 mmol/L (ref 3.5–5.1)
Sodium: 136 mmol/L (ref 135–145)

## 2017-01-02 LAB — CBC
HCT: 41.4 % (ref 36.0–46.0)
Hemoglobin: 13.9 g/dL (ref 12.0–15.0)
MCH: 28.1 pg (ref 26.0–34.0)
MCHC: 33.6 g/dL (ref 30.0–36.0)
MCV: 83.8 fL (ref 78.0–100.0)
Platelets: 274 10*3/uL (ref 150–400)
RBC: 4.94 MIL/uL (ref 3.87–5.11)
RDW: 15.4 % (ref 11.5–15.5)
WBC: 25.5 10*3/uL — ABNORMAL HIGH (ref 4.0–10.5)

## 2017-01-02 MED ORDER — RESOURCE THICKENUP CLEAR PO POWD
ORAL | Status: DC | PRN
  Filled 2017-01-02: qty 125

## 2017-01-02 MED ORDER — CHLORHEXIDINE GLUCONATE CLOTH 2 % EX PADS
6.0000 | MEDICATED_PAD | Freq: Every day | CUTANEOUS | Status: DC
  Administered 2017-01-02: 6 via TOPICAL

## 2017-01-02 NOTE — Progress Notes (Signed)
VASCULAR LAB PRELIMINARY  PRELIMINARY  PRELIMINARY  PRELIMINARY  Transcranial Doppler completed.    Preliminary report:  Technically difficult today due to constant movement May be inconclusive.  Jevante Hollibaugh, [place], RVS 01/02/2017, 12:56 PM

## 2017-01-02 NOTE — Procedures (Signed)
Objective Swallowing Evaluation: Type of Study: FEES-Fiberoptic Endoscopic Evaluation of Swallow  Patient Details  Name: Aimee [name] MRN: [id number] Date of Birth: [birth year]  Today's Date: 01/02/2017 Time: SLP Start Time (ACUTE ONLY): 1320-SLP Stop Time (ACUTE ONLY): 1354 SLP Time Calculation (min) (ACUTE ONLY): 34 min  Past Medical History:  Past Medical History:  Diagnosis Date  . Diabetes mellitus without complication ([place])   . Hypertension    Past Surgical History:  Past Surgical History:  Procedure Laterality Date  . IR ANGIO INTRA EXTRACRAN SEL INTERNAL CAROTID BILAT MOD SED  12/25/2016  . IR ANGIO VERTEBRAL SEL VERTEBRAL UNI L MOD SED  12/25/2016  . IR ANGIOGRAM FOLLOW UP STUDY  12/25/2016  . IR ANGIOGRAM FOLLOW UP STUDY  12/25/2016  . IR ANGIOGRAM FOLLOW UP STUDY  12/25/2016  . IR ANGIOGRAM FOLLOW UP STUDY  12/25/2016  . IR TRANSCATH/EMBOLIZ  12/25/2016  . RADIOLOGY WITH ANESTHESIA N/A 12/25/2016   Procedure: RADIOLOGY WITH ANESTHESIA;  Surgeon: Consuella Lose, MD;  Location: [facility];  Service: Radiology;  Laterality: N/A;   HPI: 41 year old female presents with N/V and Confusion. 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 Underwent coiling, Intubated from 5/22 to 5/28.   No Data Recorded   Assessment / [name] / Recommendation  CHL IP CLINICAL IMPRESSIONS 01/02/2017  Clinical Impression Under direct endoscopic visualization pt observed to have bilateral indentations on the posterior portion of the vocal folds, as well as slightly decreased adduction of the right arytenoid and vocal fold bowing impacting ability to phonate. Pt only achieves full adduction with a forceful involuntary cough. Swallow function is mildly impaired with presumed aspiration during the swallow with thin liquids and ice chips. No ejection of green dye seen, question a coating on the indented areas of the vocal folds and possibly in the subglottis, though difficult to  visualize. Given coughing presume aspiration of thin. Pt consisently tolerated nectar thick liquids even with large consecutive sips and timing of swallow WNL. There were mild residuals on the right side of the valleculae and right lateral channels and mild asymmetry of the pharyngeal wall, suggesitve of mild paresis of the right middle pharyngeal constrictors. Recommend pt consume a dys 3 (mech soft) diet with nectar thick liquids, pt encouraged to swallow twice to clear mild residuals. If vocal quality does not improve may need to consider ENT consult. Would also recommend MBS if there is concern for diet tolerance given poor visualization of the subglottis for aspiration during the swallow.    SLP Visit Diagnosis Dysphagia, pharyngeal phase (R13.13)  Attention and concentration deficit following --  Frontal lobe and executive function deficit following Cerebral infarction  Impact on safety and function Moderate aspiration risk      CHL IP TREATMENT RECOMMENDATION 01/02/2017  Treatment Recommendations Therapy as outlined in treatment [name] below     Prognosis 01/02/2017  Prognosis for Safe Diet Advancement Good  Barriers to Reach Goals --  Barriers/Prognosis Comment --    CHL IP DIET RECOMMENDATION 01/02/2017  SLP Diet Recommendations Dysphagia 3 (Mech soft) solids;Nectar thick liquid  Liquid Administration via Cup;Straw  Medication Administration Whole meds with puree  Compensations Multiple dry swallows after each bite/sip  Postural Changes Seated upright at 90 degrees      CHL IP OTHER RECOMMENDATIONS 01/02/2017  Recommended Consults --  Oral Care Recommendations Oral care BID  Other Recommendations --      CHL IP FOLLOW UP RECOMMENDATIONS 01/02/2017  Follow up  Recommendations Inpatient Rehab      CHL IP FREQUENCY AND DURATION 01/02/2017  Speech Therapy Frequency (ACUTE ONLY) min 2x/week  Treatment Duration 2 weeks           CHL IP ORAL PHASE 01/02/2017  Oral Phase WFL  Oral -  Pudding Teaspoon --  Oral - Pudding Cup --  Oral - Honey Teaspoon --  Oral - Honey Cup --  Oral - Nectar Teaspoon --  Oral - Nectar Cup --  Oral - Nectar Straw --  Oral - Thin Teaspoon --  Oral - Thin Cup --  Oral - Thin Straw --  Oral - Puree --  Oral - Mech Soft --  Oral - Regular --  Oral - Multi-Consistency --  Oral - Pill --  Oral Phase - Comment --    CHL IP PHARYNGEAL PHASE 01/02/2017  Pharyngeal Phase Impaired  Pharyngeal- Pudding Teaspoon --  Pharyngeal --  Pharyngeal- Pudding Cup --  Pharyngeal --  Pharyngeal- Honey Teaspoon --  Pharyngeal --  Pharyngeal- Honey Cup --  Pharyngeal --  Pharyngeal- Nectar Teaspoon --  Pharyngeal --  Pharyngeal- Nectar Cup Reduced pharyngeal peristalsis;Pharyngeal residue - valleculae;Lateral channel residue  Pharyngeal --  Pharyngeal- Nectar Straw Reduced pharyngeal peristalsis;Pharyngeal residue - valleculae;Lateral channel residue  Pharyngeal --  Pharyngeal- Thin Teaspoon --  Pharyngeal --  Pharyngeal- Thin Cup Penetration/Aspiration during swallow;Reduced airway/laryngeal closure  Pharyngeal --  Pharyngeal- Thin Straw --  Pharyngeal --  Pharyngeal- Puree Reduced pharyngeal peristalsis;Lateral channel residue;Pharyngeal residue - valleculae  Pharyngeal --  Pharyngeal- Mechanical Soft --  Pharyngeal --  Pharyngeal- Regular WFL  Pharyngeal --  Pharyngeal- Multi-consistency --  Pharyngeal --  Pharyngeal- Pill --  Pharyngeal --  Pharyngeal Comment --     No flowsheet data found.  No flowsheet data found. Herbie [place], [place] CCC-SLP [phone]  Lynann Beaver 01/02/2017, 2:32 PM

## 2017-01-02 NOTE — Progress Notes (Addendum)
Inpatient Rehabilitation  I met with the patient at the bedside along with 2 of her friends, Gwenyth Allegra and [place] and initiated conversation regarding future plans for rehab.  Pt. able to verbalize though was very soft spoken.  I introduced the IP Rehab program and provided informational booklets.  Pt. was agreeable to allow me to phone her brother Derenda Mis [phone]).  I spoke with [name] over the phone to introduce IP Rehab as  a possibility when pt. is medically stable.  [name] states the patient, he and a sister live together in a 3rd floor apartment.  I advised that pt. is expected to need a first floor entrance with a ramp or an elevator if not on first floor.  [name] states he plans to seek out a house with a ramped entrance.  We also talked about the expected need for extensive 24 hour care.  He states his wife and his sister will likely be the caregiver support while he works.  I have scheduled a meeting with [name] on Friday at 35 am. [name] declines a [place] interpreter.  He and pt. seem to have good understanding of English.   I have placed a call to Nettie Elm, Surveyor, quantity of clinical social work regarding a potential LOG as pt. is expected to need a SNF stay following a potential CIR stay to reduce burden of care.    My co-worker Gunnar Fusi will assume this case tomorrow and Friday in my absence.  Please call if questions.  [place] Admissions Coordinator  Cell [phone] Office [phone]   ADDENDUM:  [phone]  I  discussed the above information with Nettie Elm upon his return call.   Zac suggests this case be discussed with Dr. Joya Salm at [facility] meeting in the am for a decision on SNF vs. CIR then SNF.  Will provide more information and plan as it comes available.    Gerlean Ren

## 2017-01-02 NOTE — Progress Notes (Addendum)
STROKE TEAM PROGRESS NOTE   SUBJECTIVE (INTERVAL HISTORY) Patient`s condition is unchanged today and is alert and able to speak with soft voice and, following simple commands, alert, nodding yes/no, squeezing fingers on right not on the left.   OBJECTIVE Temp:  [98 F (36.7 C)-98.7 F (37.1 C)] 98.4 F (36.9 C) (05/30 1200) Pulse Rate:  [73-118] 89 (05/30 1400) Cardiac Rhythm: Normal sinus rhythm (05/30 0800) Resp:  [8-19] 14 (05/30 1400) BP: (128-163)/(90-140) 155/140 (05/30 1400) SpO2:  [96 %-100 %] 99 % (05/30 1400) Weight:  [202 lb 13.2 oz (92 kg)] 202 lb 13.2 oz (92 kg) (05/30 0437)  CBC:   Recent Labs Lab 12/28/16 0342 12/29/16 0430  01/01/17 0400 01/02/17 0416  WBC 20.6* 18.5*  < > 21.3* 25.5*  NEUTROABS 18.8* 16.3*  --   --   --   HGB 11.4* 11.9*  < > 13.2 13.9  HCT 35.8* 37.6  < > 40.6 41.4  MCV 86.3 87.0  < > 85.3 83.8  PLT 251 269  < > 249 274  < > = values in this interval not displayed.  Basic Metabolic Panel:   Recent Labs Lab 12/31/16 0415 01/01/17 0400 01/02/17 0416  NA 141 137 136  K 3.6 3.4* 3.8  CL 109 102 106  CO2 24 25 22   GLUCOSE 160* 138* 189*  BUN 22* 18 22*  CREATININE 0.51 0.46 0.50  CALCIUM 8.0* 8.2* 8.2*  MG 2.1 1.9  --   PHOS 2.8 3.8  --     Lipid Panel:     Component Value Date/Time   TRIG 378 (H) 12/31/2016 0415   HgbA1c: No results found for: HGBA1C Urine Drug Screen:     Component Value Date/Time   LABOPIA NONE DETECTED 12/25/2016 0913   COCAINSCRNUR NONE DETECTED 12/25/2016 0913   LABBENZ POSITIVE (A) 12/25/2016 0913   AMPHETMU NONE DETECTED 12/25/2016 0913   THCU NONE DETECTED 12/25/2016 0913   LABBARB NONE DETECTED 12/25/2016 0913    Alcohol Level No results found for: [place] I have personally reviewed the radiological images below and agree with the radiology interpretations.  Ct Angio Head W Or Wo Contrast Ct Angio Neck W And/or Wo Contrast 12/25/2016  CTA NECK IMPRESSION:  Normal CTA of the neck.    CTA HEAD IMPRESSION:  1. 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 Aneurysm is directed posteriorly, and demonstrates a fairly narrow neck. Neuro endovascular consultation is recommended.  2. Diffuse irregularity throughout the remaining intracranial arterial vasculature, suspicious for vasospasm secondary to acute subarachnoid hemorrhage.    Cerbral Angiogram / Coiling - Dr Kathyrn Sheriff 12/25/2016 1. 7.5 x 6.22mm right Pcom aneurysm projecting posteriorly, coiled successfully without aneurysm residual. 2. No significant vasospasm seen 3. No other intracranial aneurysms, AVM, or fistulas seen   Ct Head Wo Contrast 12/25/2016 Diffuse subarachnoid hemorrhage, filling CSF spaces in the suprasellar and basal cisterns as well as bilateral sulci. Cause is not determined but presents high suspicion for ruptured intracranial aneurysm.   TTE - Left ventricle: The cavity size was normal. Systolic function was   normal. The estimated ejection fraction was in the range of 55%   to 60%. Wall motion was normal; there were no regional wall   motion abnormalities. Doppler parameters are consistent with   abnormal left ventricular relaxation (grade 1 diastolic   dysfunction). Acoustic contrast opacification revealed no   evidence ofthrombus.  TCD 12/26/16 - no evidence of vasospasm  TCD 12/28/16 -  elevated velocity at right MCA and ACA.  Mr Brain and C-spine Wo Contrast 12/26/2016 IMPRESSION: 1. 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 More confluent acute infarcts in the high frontoparietal regions bilaterally. 2. Subarachnoid and small volume intraventricular hemorrhage. 3. Mild cervical spondylosis.  No spinal stenosis. 4. Unremarkable appearance of the cervical spinal cord.   MRI brain and C spine  12/30/2016 : 1. Overall similar appearance of scattered bilateral embolic infarcts and confluent cortical infarcts at the vertex bilaterally. 2.  No new infarcts. 3. Decreasing subarachnoid and intraventricular hemorrhage. 4. Unremarkable appearance of the cervical spinal cord  Transcranial Doppler  Date POD PCO2 HCT BP  MCA ACA PCA OPHT SIPH VERT Basilar  5/23 rs     Right  Left   76  44   -52  -48   42  31   27  41   29  48   *  -37   -35      5/25 je     Right  Left   103  118   -95  -94   58  49   37  48   51  72   -35  *   -34      5/28 je     Right  Left   101  113   -53  -68   34  -36   33  42   56  72   -42  *   *       PHYSICAL EXAM  Temp:  [98 F (36.7 C)-98.7 F (37.1 C)] 98.4 F (36.9 C) (05/30 1200) Pulse Rate:  [73-118] 89 (05/30 1400) Resp:  [8-19] 14 (05/30 1400) BP: (128-163)/(90-140) 155/140 (05/30 1400) SpO2:  [96 %-100 %] 99 % (05/30 1400) Weight:  [202 lb 13.2 oz (92 kg)] 202 lb 13.2 oz (92 kg) (05/30 0437)  General - Well nourished, well developed, intubated and sedated. Cannot decrease propofol due to agitation.   Ophthalmologic - Fundi not visualized due to noncooperation.  Cardiovascular - Regular rate and rhythm.  Neuro - awake alert eyes open and alert  Soft hoarse voice and speaks short sentences.Pupils pinpoint but reactive. right gaze preference, does not blink to threat on the left, positive corneal and gag. No movement on stim Left, can squeeze fingers on the right.  DTR diminished and equivocal toes. Nods yes to sensation on the right arm and leg but not on the left arm and leg..    ASSESSMENT/PLAN [name] is a 41 y.o. female with history of hypertension and diabetes mellitus presenting with tetraplegia and altered mental status. She did not receive IV t-PA due to subarachnoid hemorrhage.  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  CTA Head - 7 x 6 x 5 mm right posterior communicating  artery aneurysm.  CTA neck - normal  Cerebral angio - no vasospasm.  TCD 12/28/16 increased velocity at right MCA and ACA  2D Echo - EF 55-60%  VTE prophylaxis - SCDs DIET DYS 3 Room service appropriate? Yes; Fluid consistency: Nectar Thick  No antithrombotic prior to admission, now on No antithrombotic secondary to [place].  S/p right PCOM coiling by Dr. 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  LP - Xanthochromia. No sign of infection  Still no good explanation of quadriplegia - may related to "brainstem shock" due to concentrated basal cistern blood product - expect pt symptom will improve over time if this is the case  Hypertension  Stable - IV Cardene  BP goal < 160  Long-term BP goal normotensive  Other Stroke Risk Factors  Obesity, Body mass index is 34.81 kg/m., recommend weight loss, diet and exercise as appropriate   Other Active Problems  Leukocytosis - 16.1 -> 23.6 -> 20.6 -> 18.5 -> 16.6 (temp 99.4 Ax) (on Decadron)  IV Rocephin / vancomycin   [name] Etiology of her significant quadriparesis remains unclear as brain imaging does not show significant left hemispheric infarcts and theoretically spinal cord infarct is possible and may have been missed on the MRI of the C-spine but some clinical improvement in her right hand strength  is encouraging. Mobilize out of bed. Physical occupational, speech therapy and rehabilitation consults. Expect slow neurological improvement. Long discussion with the patient   and answered questions. .No  Family at bedside today Hospital day # 8  This patient is critically ill due to diffuse basal SAH, ruptured PCOM aneurysm, HTN,  quadriplegia and at significant risk of neurological worsening, death form recurrent SAH, vasospasm, hydrocephalus, seizure. This patient's care requires constant monitoring of vital signs, hemodynamics, respiratory and cardiac monitoring, of multiple databases, neurological assessment, discussion with family, other specialists and medical decision making of high complexity. I spent 30 minutes of neurocritical care time in the care of this patient.   Antony Contras, MD Medical Director [place] Pager: [phone] 01/02/2017 3:48 PM To contact Stroke Continuity provider, please refer to [email or web address] After hours, contact General Neurology

## 2017-01-02 NOTE — Progress Notes (Signed)
No issues overnight. No complaints this am.  EXAM:  BP (!) 148/99   Pulse 94   Temp 98.5 F (36.9 C) (Oral)   Resp 13   Ht 5\' 4"  (1.626 m)   Wt 92 kg (202 lb 13.2 oz)   LMP 12/31/2016 Comment: vented pt sah  SpO2 100%   BMI 34.81 kg/m   Awake, alert, oriented  Speech fluent CN grossly intact  Can squeeze hand on right, no other voluntary movement   IMPRESSION:  41 y.o. female SAH d# 9 s/p coiling right Pcom. Remains stable quadriparesis.  PLAN: - Cont supportive care - PMR consult

## 2017-01-02 NOTE — Progress Notes (Addendum)
Physical Therapy Treatment Patient Details Name: Aimee [name] MRN: [id number] DOB: 1975/12/25 Today's Date: 01/02/2017    History of Present Illness Patient is a 41 y/o female presents from home with N/V, confusion. Head CT-diffuse SAH. 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 More confluent acute infarcts in the high frontoparietal regions bilaterally. Intubated 5/22-5/28.    PT Comments    Noted increase in active movement in both UE's.  Required increased response time for commands. Agree with need for CIR prior to discharge.  Nephew translated during session.  Follow Up Recommendations  CIR;Supervision for mobility/OOB;Supervision/Assistance - 24 hour     Equipment Recommendations  Other (comment) (TBD)    Recommendations for Other Services Rehab consult     Precautions / Restrictions Precautions Precautions: Fall Precaution Comments: quadriplegia; keep MAP 100; PRAFOs Restrictions Weight Bearing Restrictions: No    Mobility  Bed Mobility                  Transfers                    Ambulation/Gait                 Stairs            Wheelchair Mobility    Modified Rankin (Stroke Patients Only) Modified Rankin (Stroke Patients Only) Pre-Morbid Rankin Score: No symptoms Modified Rankin: Severe disability     Balance                                            Cognition Arousal/Alertness: Awake/alert Behavior During Therapy: WFL for tasks assessed/performed;Flat affect Overall Cognitive Status: Difficult to assess                                 General Comments: Slow processing with delayed response at times.      Exercises Other Exercises Other Exercises: PROM all extremities UE and LE. Other Exercises: Facilitation techniques for  movement Other Exercises: LUE: extensor tone; elbow flexion 2/5 (? tone vs controlled contraction), Finger/hand flexion and extension at least 3/5 Other Exercises: RUE: extensor tone; elbow flexion and extension at 2/5; finger/hand flexion and extension with good movement Other Exercises: BLE's:  extensor tone; no active movement noted    General Comments        Pertinent Vitals/Pain Pain Assessment: No/denies pain    Home Living                      Prior Function            PT Goals (current goals can now be found in the care [name] section) Acute Rehab PT Goals Patient Stated Goal: none stated Progress towards PT goals: Progressing toward goals    Frequency    Min 3X/week      PT [name] Current [name] remains appropriate    Co-evaluation              AM-PAC PT "6 Clicks" Daily Activity  Outcome Measure  Difficulty turning over in bed (including adjusting bedclothes, sheets and blankets)?: Total Difficulty moving from lying on back to sitting on the side of the bed? : Total Difficulty  sitting down on and standing up from a chair with arms (e.g., wheelchair, bedside commode, etc,.)?: Total Help needed moving to and from a bed to chair (including a wheelchair)?: Total Help needed walking in hospital room?: Total Help needed climbing 3-5 steps with a railing? : Total 6 Click Score: 6    End of Session   Activity Tolerance: Patient tolerated treatment well Patient left: in bed;with bed alarm set;with family/visitor present   PT Visit Diagnosis: Hemiplegia and hemiparesis Hemiplegia - Right/Left:  (Bilateral) Hemiplegia - caused by: Nontraumatic SAH;Cerebral infarction     Time: 1975-8832 PT Time Calculation (min) (ACUTE ONLY): 14 min  Charges:  $Therapeutic Activity: 8-22 mins                    G Codes:       Carita Pian. Sanjuana Kava, [facility] Acute Rehab Services Pager [place] 01/02/2017, 9:59 PM

## 2017-01-02 NOTE — Progress Notes (Signed)
PULMONARY / CRITICAL CARE MEDICINE   Name: Aimee [name] MRN: [id number] DOB: [birth year]    ADMISSION DATE:  12/25/2016 CONSULTATION DATE:  12/25/2016  REFERRING MD:  Dr. Dina Rich, EDP  CHIEF COMPLAINT:  AMS  HISTORY OF PRESENT ILLNESS: 41 , SAH, coil, vent  SUBJECTIVE Awake, uncomfortable due to position in bed, no resp distress  VITAL SIGNS: BP (!) 148/99   Pulse 94   Temp 98 F (36.7 C) (Oral)   Resp 13   Ht 5\' 4"  (1.626 m)   Wt 92 kg (202 lb 13.2 oz)   LMP 12/31/2016 Comment: vented pt sah  SpO2 100%   BMI 34.81 kg/m   HEMODYNAMICS:    VENTILATOR SETTINGS:    INTAKE / OUTPUT: I/O last 3 completed shifts: In: 4400.8 [I.V.:2700; NG/GT:690.8; IV Piggyback:1010] Out: 4900 [Urine:4900]  PHYSICAL EXAMINATION: General:  Obese woman, NAD,  HEENT: OP clear, no UA noise, NGT in place PSY: awake and appropriate Neuro: follows commands in RUE, LUE, B LE are not moving,  CV: regular, no M PULM: clear bilaterally GI: soft, benign, + BS Extremities: trace pretibial edema, foot drop boots in place B  Skin: no rash   LABS:  BMET  Recent Labs Lab 12/31/16 0415 01/01/17 0400 01/02/17 0416  NA 141 137 136  K 3.6 3.4* 3.8  CL 109 102 106  CO2 24 25 22   BUN 22* 18 22*  CREATININE 0.51 0.46 0.50  GLUCOSE 160* 138* 189*   Electrolytes  Recent Labs Lab 12/30/16 0430 12/31/16 0415 01/01/17 0400 01/02/17 0416  CALCIUM 8.1* 8.0* 8.2* 8.2*  MG 2.4 2.1 1.9  --   PHOS 3.4 2.8 3.8  --    CBC  Recent Labs Lab 12/31/16 0415 01/01/17 0400 01/02/17 0416  WBC 14.2* 21.3* 25.5*  HGB 11.1* 13.2 13.9  HCT 34.1* 40.6 41.4  PLT 222 249 274   Coag's No results for input(s): APTT, INR in the last 168 hours. Sepsis Markers No results for input(s): LATICACIDVEN, PROCALCITON, O2SATVEN in the last 168 hours.  ABG  Recent Labs Lab 12/29/16 0144 12/30/16 0440 12/31/16 0320  PHART 7.322* 7.386 7.499*  PCO2ART 48.8* 39.9 29.3*  PO2ART 83.0 96.7 82.5*    Liver Enzymes  Recent Labs Lab 12/28/16 0342  AST 46*  ALT 51  ALKPHOS 48  BILITOT 0.5  ALBUMIN 3.2*   Cardiac Enzymes No results for input(s): TROPONINI, PROBNP in the last 168 hours.  Glucose  Recent Labs Lab 01/01/17 0814 01/01/17 1115 01/01/17 1533 01/01/17 1940 01/02/17 0319 01/02/17 0806  GLUCAP 140* 185* 159* 176* 172* 171*   Imaging Dg Chest Port 1 View  Result Date: 01/02/2017 CLINICAL DATA:  Respiratory failure.  Extubation. EXAM: PORTABLE CHEST 1 VIEW COMPARISON:  12/31/2016. FINDINGS: Interim extubation. NG tube and right IJ line stable position. Heart size normal. Low lung volumes. No focal alveolar infiltrate. No pleural effusion or pneumothorax . IMPRESSION: 1.  Interim extubation.  NG tube and right IJ line stable position. 2. Low lung volumes. No acute cardiopulmonary disease. Chest is stable from prior exam. Electronically Signed   By: Marcello Moores  Register   On: 01/02/2017 07:33   STUDIES:  CT Head 5/22 > Diffuse subarachnoid hemorrhage, filling CSF spaces in the suprasellar and basal cisterns as well as bilateral sulci. 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 Aneurysm is directed posteriorly, and demonstrates a fairly narrow neck.  Neuro endovascular consultation is recommended. 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 More confluent acute infarcts in the high frontoparietal regions bilaterally. 2. Subarachnoid and small volume intraventricular hemorrhage. 3. Mild cervical spondylosis.  No spinal stenosis. 4. Unremarkable appearance of the cervical spinal cord.  CULTURES: Csf 5/23>>> negative Blood cx 5/24 >>  negative  ANTIBIOTICS: CTX 5/23>>> 5/30 Vanc 5/23>>>5/28  SIGNIFICANT EVENTS: 5/22 > SAH, coil 5/23- not moving ext 5/24 LP  LINES/TUBES: ETT 5/22 >>5/28 5/22 rt IJ>>>  DISCUSSION: 41 year old female presents with N/V and Confusion. 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 Extubated 5/28. Course c/b paraplegia, ? Additional injury beyond the [facility]  ASSESSMENT / [name]:  PULMONARY A: Respiratory Insufficiency in setting of aneurysm and SAH, resolved P:   - continue pulm hygiene - swallowing precautions until she can pass a swallow eval - PT / OT  CARDIOVASCULAR A:  HTN< SAH Diastolic dysfunction per TTE P:  - volume and BP goals to avoid vasospasm - nimodipine   RENAL A:   Hypokalemia, improved P:   - follow BMP   GASTROINTESTINAL A:   Dysphagia due to neurological injury P:   - NPO - TF's per NGT until she can pass swallowing eval.  - PPI for acid prophylaxis, esp while on steroids.    HEMATOLOGIC A:   SAH Mild anemia leukocytisis P:  - SCD in place - passive ROM therapy - follow intermittent CBC  INFECTIOUS A:   Leukocytosis slow resolving, likely due to steroids LP unimpressive infectious source of any kind, appears as SAH P:   - s/p 8 days empiric ceftriaxone, stop on 5/30 and follow clinically. Suspicion for meningitis here low.  - follow cx data  ENDOCRINE A:   DM    P:    - follow CBG, SSI per protocol   NEUROLOGIC  Intake/Output Summary (Last 24 hours) at 01/02/17 3545 Last data filed at 01/02/17 0600  Gross per 24 hour  Intake          2600.83 ml  Output             2400 ml  Net           200.83 ml   A:   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  Paraplegia - etiology unclear as this appears to be more significant than would be explained by [facility]. ? Cord infarct that is not evident on available imaging. No evidence meningitis.  Embolic CVA's P:   RASS goal: 0 - anticipate  prolonged recovery time - tight BP control - nimodipine and steroids as ordered, at risk vasospasm and edema - positive fluid balance at this time, per NSGY goals.  - follow TCD  FAMILY  - Updates: No family bedside  - Inter-disciplinary family meet or Palliative Care meeting due by: ongoing  Independent CC time 33 minutes  Baltazar Apo, MD, PhD 01/02/2017, 9:20 AM [facility] [phone] or if no answer [phone]

## 2017-01-02 NOTE — Progress Notes (Signed)
  Speech Language Pathology Treatment: Dysphagia  Patient Details Name: Aimee [name] MRN: [id number] DOB: 22-Mar-1976 Today's Date: 01/02/2017 Time: 7680-8811 SLP Time Calculation (min) (ACUTE ONLY): 10 min  Assessment / [name] / Recommendation Clinical Impression  Pt demonstrates ongoing dysphonia, cannot achieve any phonation past a whisper. Pt has been consuming ice chips with no immediate signs of aspiration per RN, but had increased coughing overnight. Tolerated a few bites of puree, but difficult to determine tolerance subjectively. Would not want pt to utilize large NG for much longer, so objective assessment warranted to determine safety with ice chips, determine readiness for diet or need for Cortrak and also direct SLP treatment [name]. Will proceed with FEES later today.   HPI HPI: 41 year old female presents with N/V and Confusion. 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 Underwent coiling, Intubated from 5/22 to 5/28.       SLP [name]   (FEES)       Recommendations  Diet recommendations: NPO                Oral Care Recommendations: Oral care QID [name]:  (FEES)       GO                Keegen Heffern, Katherene Ponto 01/02/2017, 1:13 PM

## 2017-01-02 NOTE — Progress Notes (Signed)
Transcranial Doppler  Date POD PCO2 HCT BP  MCA ACA PCA OPHT SIPH VERT Basilar  5/23 rs     Right  Left   76  44   -52  -48   42  31   27  41   29  48   *  -37   -35      5/25 je     Right  Left   103  118   -95  -94   58  49   37  48   51  72   -35  *   -34      5/28 je     Right  Left   101  113   -53  -68   34  -36   33  42   56  72   -42  *   *       5/30 VS     Right  Left   56  21   -32  *   30  *   41  41   -42  45   *  *   *  *          Right  Left                                            Right  Left                                            Right  Left                                        MCA = Middle Cerebral Artery      OPHT = Opthalmic Artery     BASILAR = Basilar Artery   ACA = Anterior Cerebral Artery     SIPH = Carotid Siphon PCA = Posterior Cerebral Artery   VERT = Verterbral Artery                   Normal MCA = 62+\-12 ACA = 50+\-12 PCA = 42+\-23   12/26/16 - * not insonated rds 5/25 [place] ratio: right = 2.8 left = 3.2 JE 5/28 Unable to complete study (left vert and basilar) due to increased movement. Patient was recently extubated. [place] ratio: right = 2.5 left = 2.9 JE 01/02/17 [place] Right 1.36 Left not obtained due to values not obtained Technically difficult due to constant movement of the head and eyes.Exam may be inconclusive today.

## 2017-01-03 LAB — BASIC METABOLIC PANEL WITH GFR
Anion gap: 11 (ref 5–15)
BUN: 18 mg/dL (ref 6–20)
CO2: 21 mmol/L — ABNORMAL LOW (ref 22–32)
Calcium: 8.4 mg/dL — ABNORMAL LOW (ref 8.9–10.3)
Chloride: 104 mmol/L (ref 101–111)
Creatinine, Ser: 0.49 mg/dL (ref 0.44–1.00)
GFR calc Af Amer: >60 mL/min (ref >60)
GFR calc non Af Amer: >60 mL/min (ref >60)
Glucose, Bld: 143 mg/dL — ABNORMAL HIGH (ref 65–99)
Potassium: 4 mmol/L (ref 3.5–5.1)
Sodium: 136 mmol/L (ref 135–145)

## 2017-01-03 LAB — CBC
HCT: 42.1 % (ref 36.0–46.0)
Hemoglobin: 14.1 g/dL (ref 12.0–15.0)
MCH: 28.4 pg (ref 26.0–34.0)
MCHC: 33.5 g/dL (ref 30.0–36.0)
MCV: 84.7 fL (ref 78.0–100.0)
Platelets: 256 10*3/uL (ref 150–400)
RBC: 4.97 MIL/uL (ref 3.87–5.11)
RDW: 15.8 % — ABNORMAL HIGH (ref 11.5–15.5)
WBC: 27.7 10*3/uL — ABNORMAL HIGH (ref 4.0–10.5)

## 2017-01-03 LAB — GLUCOSE, CAPILLARY
Glucose-Capillary: 146 mg/dL — ABNORMAL HIGH (ref 65–99)
Glucose-Capillary: 149 mg/dL — ABNORMAL HIGH (ref 65–99)
Glucose-Capillary: 172 mg/dL — ABNORMAL HIGH (ref 65–99)
Glucose-Capillary: 188 mg/dL — ABNORMAL HIGH (ref 65–99)
Glucose-Capillary: 198 mg/dL — ABNORMAL HIGH (ref 65–99)
Glucose-Capillary: 149 mg/dL — ABNORMAL HIGH (ref 65–99)

## 2017-01-03 LAB — MAGNESIUM: Magnesium: 1.9 mg/dL (ref 1.7–2.4)

## 2017-01-03 LAB — PHOSPHORUS: Phosphorus: 3.5 mg/dL (ref 2.5–4.6)

## 2017-01-03 NOTE — Progress Notes (Signed)
PULMONARY / CRITICAL CARE MEDICINE   Name: Aimee [name] MRN: [id number] DOB: 07-29-1976    ADMISSION DATE:  12/25/2016 CONSULTATION DATE:  12/25/2016  REFERRING MD:  Dr. Dina Rich, EDP  CHIEF COMPLAINT:  AMS  HISTORY OF PRESENT ILLNESS: 48 , SAH, coil, vent  SUBJECTIVE  tolerating dysphagia 3 diet with assistance May be getting some very slight lower extremity movement  VITAL SIGNS: BP 122/85   Pulse 100   Temp 98.4 F (36.9 C) (Oral)   Resp (!) 21   Ht 5\' 4"  (1.626 m)   Wt 91.7 kg (202 lb 2.6 oz)   LMP 12/31/2016 Comment: vented pt sah  SpO2 99%   BMI 34.70 kg/m   HEMODYNAMICS:    VENTILATOR SETTINGS:    INTAKE / OUTPUT: I/O last 3 completed shifts: In: 5631 [P.O.:90; I.V.:2795; NG/GT:960; IV Piggyback:365] Out: 4300 [Urine:4300]  PHYSICAL EXAMINATION: General: Obese woman, comfortable HEENT: Oropharynx clear, no stridor, PSY: Awake and appropriate Neuro: Following commands bilateral upper extremities. Very minimal movement bilateral lower extremities, may have moved toes CV: Regular, no murmur, trace pretibial edema PULM: Clear bilaterally, no wheeze, no crackles GI: Soft, benign, positive bowel sounds Extremities: Bilateral foot drop boots in place Skin: No rash   LABS:  BMET  Recent Labs Lab 01/01/17 0400 01/02/17 0416 01/03/17 0545  NA 137 136 136  K 3.4* 3.8 4.0  CL 102 106 104  CO2 25 22 21*  BUN 18 22* 18  CREATININE 0.46 0.50 0.49  GLUCOSE 138* 189* 143*   Electrolytes  Recent Labs Lab 12/31/16 0415 01/01/17 0400 01/02/17 0416 01/03/17 0545  CALCIUM 8.0* 8.2* 8.2* 8.4*  MG 2.1 1.9  --  1.9  PHOS 2.8 3.8  --  3.5   CBC  Recent Labs Lab 01/01/17 0400 01/02/17 0416 01/03/17 0545  WBC 21.3* 25.5* 27.7*  HGB 13.2 13.9 14.1  HCT 40.6 41.4 42.1  PLT 249 274 256   Coag's No results for input(s): APTT, INR in the last 168 hours. Sepsis Markers No results for input(s): LATICACIDVEN, PROCALCITON, O2SATVEN in the last  168 hours.  ABG  Recent Labs Lab 12/29/16 0144 12/30/16 0440 12/31/16 0320  PHART 7.322* 7.386 7.499*  PCO2ART 48.8* 39.9 29.3*  PO2ART 83.0 96.7 82.5*   Liver Enzymes  Recent Labs Lab 12/28/16 0342  AST 46*  ALT 51  ALKPHOS 48  BILITOT 0.5  ALBUMIN 3.2*   Cardiac Enzymes No results for input(s): TROPONINI, PROBNP in the last 168 hours.  Glucose  Recent Labs Lab 01/02/17 1126 01/02/17 1555 01/02/17 1931 01/02/17 2330 01/03/17 0308 01/03/17 0838  GLUCAP 169* 241* 216* 129* 146* 149*   Imaging No results found. STUDIES:  CT Head 5/22 > Diffuse subarachnoid hemorrhage, filling CSF spaces in the suprasellar and basal cisterns as well as bilateral sulci. 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 Aneurysm is directed posteriorly, and demonstrates a fairly narrow neck. Neuro endovascular consultation is recommended. 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 More confluent acute infarcts in the high frontoparietal regions bilaterally. 2. Subarachnoid and small volume intraventricular hemorrhage. 3. Mild cervical spondylosis.  No spinal stenosis. 4. Unremarkable appearance of the cervical spinal cord.  CULTURES: Csf 5/23>>> negative Blood cx 5/24 >> negative  ANTIBIOTICS: CTX 5/23>>> 5/30 Vanc  5/23>>>5/28  SIGNIFICANT EVENTS: 5/22 > SAH, coil 5/23- not moving ext 5/24 LP  LINES/TUBES: ETT 5/22 >>5/28 5/22 rt IJ>>>  DISCUSSION: 41 year old female presents with N/V and Confusion. 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 Extubated 5/28. Course c/b paraplegia, ? Additional injury beyond the  [facility]  ASSESSMENT / [name]:  PULMONARY A: Respiratory Insufficiency in setting of aneurysm and SAH, resolved P:   Continue pulmonary hygiene PT/OT  CARDIOVASCULAR A:  HTN< SAH Diastolic dysfunction per TTE P:  Volume and blood pressure goals per neurosurgery to avoid vasospasm Nimodipine as ordered  RENAL A:   Hypokalemia, improved P:   Follow BMP   GASTROINTESTINAL A:   Dysphagia due to neurological injury P:   Cleared for a dysphagia 3 diet PPI for acid prophylaxis while on steroids   HEMATOLOGIC A:   SAH Mild anemia leukocytisis P:  SCD in place Passive range of motion therapy Follow CBC intermittently   INFECTIOUS A:   Leukocytosis slow resolving, likely due to steroids LP unimpressive infectious source of any kind, appears as SAH P:   Completed 8 days empiric ceftriaxone, through 5/30 Follow culture data  ENDOCRINE A:   DM    P:    Follow CBG, sliding scale insulin per protocol   NEUROLOGIC A:   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 ? Cord infarct that is not evident on available imaging. No evidence meningitis.  Embolic CVA's P:   RASS goal: 0 Tight blood pressure control Nimodipine and steroids as ordered, at risk vasospasm and edema. Duration of steroids to be determined by neurosurgery Positive fluid balance at this time, per neurosurgery goals Follow TCD She will likely need inpatient rehabilitation  FAMILY  - Updates: No family bedside  - Inter-disciplinary family meet or Palliative Care meeting due by: ongoing  PCCM will sign off 5/31. Please call if we can assist  Baltazar Apo, MD, PhD 01/03/2017, 9:33 AM [facility] [phone] or if no answer [phone]

## 2017-01-03 NOTE — Care Management Note (Signed)
Case Management Note  Patient Details  Name: Aimee [name] MRN: [id number] Date of Birth: 06/04/1976  Subjective/Objective:  Pt admitted on 12/2216 with Grade 5 SAH with Rt PCOM artery segment aneurym.  PTA, pt independent; lives with brother.                   Action/Plan: Pt currently intubated; for angiogram and likely coiling of RT PCOM aneurysm.  Will continue to follow as pt progresses.    Expected Discharge Date:                  Expected Discharge Plan:  IP Rehab Facility  In-House Referral:     Discharge planning Services  CM Consult  Post Acute Care Choice:    Choice offered to:     DME Arranged:    DME Agency:     HH Arranged:    HH Agency:     Status of Service:  In process, will continue to follow  If discussed at Long Length of Stay Meetings, dates discussed:    Additional Comments:  01/03/17 J. Amerson, RN, BSN Met with pt and several family members at bedside.  We discussed PT/OT's recommendation of inpatient rehab.  Family is committed to caring for pt after rehab; brother Juan to meet with rehab liaison is the AM to discuss care plan.  Pt is undocumented and has been in the [place] for approximately 6 mos.  Case manager will follow and collaborate with CIR regarding disposition.   Will follow progress.    Julie W. Amerson, RN, BSN  Trauma/Neuro ICU Case Manager [phone] 

## 2017-01-03 NOTE — Progress Notes (Signed)
Pt seen and examined. No issues overnight.   EXAM: Temp:  [97.7 F (36.5 C)-98.9 F (37.2 C)] 98.6 F (37 C) (05/31 0800) Pulse Rate:  [76-113] 85 (05/31 0900) Resp:  [12-24] 12 (05/31 0900) BP: (122-168)/(77-140) 126/79 (05/31 0900) SpO2:  [94 %-100 %] 99 % (05/31 0900) Weight:  [91.7 kg (202 lb 2.6 oz)] 91.7 kg (202 lb 2.6 oz) (05/31 0500) Intake/Output      05/30 0701 - 05/31 0700 05/31 0701 - 06/01 0700   P.O. 90    I.V. (mL/kg) 1820 (19.8) 10 (0.1)   NG/GT 250    IV Piggyback 210    Total Intake(mL/kg) 2370 (25.8) 10 (0.1)   Urine (mL/kg/hr) 2850 (1.3)    Stool 0 (0)    Total Output 2850     Net -480 +10        Stool Occurrence 4 x     Awake, alert, oriented Speech fluent CN intact Squeezes with right hand, 1-2/5 right elbow flexion Trace movement left hand ? Wiggles toes bilaterally  LABS: Lab Results  Component Value Date   CREATININE 0.49 01/03/2017   BUN 18 01/03/2017   NA 136 01/03/2017   K 4.0 01/03/2017   CL 104 01/03/2017   CO2 21 (L) 01/03/2017   Lab Results  Component Value Date   WBC 27.7 (H) 01/03/2017   HGB 14.1 01/03/2017   HCT 42.1 01/03/2017   MCV 84.7 01/03/2017   PLT 256 01/03/2017    IMPRESSION: - 41 y.o. female SAH d# 10, appears to have subtle improvement in quadriparesis  PLAN: - Cont supportive care - If she remains stable/improved, would likely be stable for transfer to CIR in the next day or two.

## 2017-01-03 NOTE — Progress Notes (Signed)
Occupational Therapy Treatment Patient Details Name: Aimee [name] MRN: [id number] DOB: [birth year] Today's Date: 01/03/2017    History of present illness Patient is a 41 y/o female presents from home with N/V, confusion. Head CT-diffuse SAH. 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 More confluent acute infarcts in the high frontoparietal regions bilaterally. Intubated 5/22-5/28.   OT comments  Pt demonstrating improvement. Nsg moved pt to chair using sky lift prior to session. Pt complained of "dizziness" at times during session, however, VSS. Pt demonstrates the development of extensor tone in BUE, however, pt is able to demonstrate isolated movement out of synergy pattern with RUE and L hand. Pt also demonstrated active movement with R leg ext in sitting (swinging leg). Pt able to hold suction brush to complete oral care with mod A to control elbow using R hand. [name] brother present at end of session and interpreting, which increased performance with session.  Continue to recommend CIR when medically stable. Family appears very supportive. Excellent participation from pt.  Will continue to follow acutely.  Follow Up Recommendations  CIR;Supervision/Assistance - 24 hour    Equipment Recommendations  Other (comment) (TBA)    Recommendations for Other Services Rehab consult    Precautions / Restrictions Precautions Precautions: Fall Precaution Comments: quadriplegia; keep MAP 100; PRAFOs Required Braces or Orthoses: Other Brace/Splint (B PRAFO boots)       Mobility Bed Mobility Overal bed mobility: Needs Assistance                Transfers                 General transfer comment: used sky lift to get t OOB     Balance        Good head control in sitting                                    ADL  either performed or assessed with clinical judgement   ADL Overall ADL's : Needs assistance/impaired     Grooming: Maximal assistance Grooming Details (indicate cue type and reason): Pt able to grasp toothbrush and assist with oral care using suciton brush; able to wash face once hand at face                               General ADL Comments: Beginning to participate with grooming tasks     Vision   Additional Comments: Will further assess. Note conjugate gaze  Pt complaining of dizziness at times. Will further assess.    Perception     Praxis  Pt appears to demonstrate difficulty with motor planning    Cognition Arousal/Alertness: Awake/alert Behavior During Therapy: [facility] for tasks assessed/performed Overall Cognitive Status: Impaired/Different from baseline Area of Impairment: Attention;Awareness;Problem solving                   Current Attention Level: Sustained   Following Commands: Follows one step commands with increased time   Awareness: Emergent Problem Solving: Slow processing;Difficulty sequencing General Comments: Per nsg, pt understands english. [name] brother into room in latter part of session and interpreting. Increased performance. Brother states he feels as if she doesn't understand as much [place] as "before the storke"  Exercises Exercises: General Upper Extremity General Exercises - Upper Extremity Elbow Flexion: Right;AAROM;10 reps;Seated Elbow Extension: AAROM;Right;10 reps;Seated Digit Composite Flexion: AROM;Strengthening;Both;10 reps Composite Extension: AROM;Strengthening;Both;10 reps Other Exercises Other Exercises: shoulder elevation actively BUE when given cue "show me I don't know" an dpt able to copy gesture Other Exercises: after inhibiting extensor tone BUE, assited with hand to mouth movement pattern BUE Other Exercises: B scapular AA/PROM glide in all range followed by P/AAROM PNMF diagonals BUE Other  Exercises: Attmepted to engage core with facilitating forward protraction of R shoulder in sitting Other Exercises: Moving R LE actively swinging leg in sitting with gravity eliminated   Shoulder Instructions       General Comments      Pertinent Vitals/ Pain       Pain Assessment: Faces Faces Pain Scale: Hurts little more Pain Location: general discomfort Pain Descriptors / Indicators: Grimacing Pain Intervention(s): Limited activity within patient's tolerance  Home Living                                          Prior Functioning/Environment              Frequency  Min 3X/week        Progress Toward Goals  OT Goals(current goals can now be found in the care [name] section)  Progress towards OT goals: Progressing toward goals  Acute Rehab OT Goals Patient Stated Goal: to get better OT Goal Formulation: With patient Time For Goal Achievement: 01/15/17 Potential to Achieve Goals: Good ADL Goals Pt Will Perform Grooming: with max assist;sitting Pt/caregiver will Perform Home Exercise Program: Both right and left upper extremity;Increased ROM;With written HEP provided;With Supervision Additional ADL Goal #1: Pt will complete bed mobility with maximum assistance in preparation for ADL tasks seated at EOB.  Additional ADL Goal #2: Pt will tolerate sitting at EOB with max assist for approximately 20 minutes for seated ADL tasks with stable vital signs.  Additional ADL Goal #3: Pt will demonstrate ability to direct family/staff in completing of bathing tasks with increased time for processing and no more than 2 VC's. Additional ADL Goal #4: Pt will demonstrate selective attention during ADL tasks for 10 minutes in a non-distracting environment.   [name] Discharge [name] remains appropriate    Co-evaluation                 AM-PAC PT "6 Clicks" Daily Activity     Outcome Measure   Help from another person eating meals?: Total Help from another  person taking care of personal grooming?: A Lot Help from another person toileting, which includes using toliet, bedpan, or urinal?: Total Help from another person bathing (including washing, rinsing, drying)?: Total Help from another person to put on and taking off regular upper body clothing?: Total Help from another person to put on and taking off regular lower body clothing?: Total 6 Click Score: 7    End of Session    OT Visit Diagnosis: Cognitive communication deficit (R41.841);Muscle weakness (generalized) (M62.81);Other symptoms and signs involving the nervous system (R29.898) Symptoms and signs involving cognitive functions: Cerebral infarction   Activity Tolerance Patient tolerated treatment well   Patient Left in chair;with call bell/phone within reach;with family/visitor present   Nurse Communication Mobility status;Need for lift equipment        Time: 1215-1250 OT Time Calculation (min): 35 min  Charges: OT General Charges $OT  Visit: 1 Procedure OT Treatments $Self Care/Home Management : 8-22 mins $Neuromuscular Re-education: 8-22 mins  [facility], OT/L  [phone] 01/03/2017   Aimee [name],HILLARY 01/03/2017, 4:00 PM

## 2017-01-03 NOTE — Progress Notes (Signed)
STROKE TEAM PROGRESS NOTE   SUBJECTIVE (INTERVAL HISTORY) Patient`s Condition is slightly improved. She is moving the right hand more as well as able to wiggle the toes in both legs. However she remains yet quadriplegic. Last transcranial Doppler study showed no evidence of vasospasm.  OBJECTIVE Temp:  [97.7 F (36.5 C)-98.9 F (37.2 C)] 98.2 F (36.8 C) (05/31 1136) Pulse Rate:  [82-113] 85 (05/31 0900) Cardiac Rhythm: Normal sinus rhythm (05/31 0400) Resp:  [12-24] 12 (05/31 0900) BP: (122-168)/(77-140) 126/79 (05/31 0900) SpO2:  [94 %-100 %] 99 % (05/31 0900) Weight:  [202 lb 2.6 oz (91.7 kg)] 202 lb 2.6 oz (91.7 kg) (05/31 0500)  CBC:   Recent Labs Lab 12/28/16 0342 12/29/16 0430  01/02/17 0416 01/03/17 0545  WBC 20.6* 18.5*  < > 25.5* 27.7*  NEUTROABS 18.8* 16.3*  --   --   --   HGB 11.4* 11.9*  < > 13.9 14.1  HCT 35.8* 37.6  < > 41.4 42.1  MCV 86.3 87.0  < > 83.8 84.7  PLT 251 269  < > 274 256  < > = values in this interval not displayed.  Basic Metabolic Panel:   Recent Labs Lab 01/01/17 0400 01/02/17 0416 01/03/17 0545  NA 137 136 136  K 3.4* 3.8 4.0  CL 102 106 104  CO2 25 22 21*  GLUCOSE 138* 189* 143*  BUN 18 22* 18  CREATININE 0.46 0.50 0.49  CALCIUM 8.2* 8.2* 8.4*  MG 1.9  --  1.9  PHOS 3.8  --  3.5    Lipid Panel:     Component Value Date/Time   TRIG 378 (H) 12/31/2016 0415   HgbA1c: No results found for: HGBA1C Urine Drug Screen:     Component Value Date/Time   LABOPIA NONE DETECTED 12/25/2016 0913   COCAINSCRNUR NONE DETECTED 12/25/2016 0913   LABBENZ POSITIVE (A) 12/25/2016 0913   AMPHETMU NONE DETECTED 12/25/2016 0913   THCU NONE DETECTED 12/25/2016 0913   LABBARB NONE DETECTED 12/25/2016 0913    Alcohol Level No results found for: [place] I have personally reviewed the radiological images below and agree with the radiology interpretations.  Ct Angio Head W Or Wo Contrast Ct Angio Neck W And/or Wo  Contrast 12/25/2016  CTA NECK IMPRESSION:  Normal CTA of the neck.   CTA HEAD IMPRESSION:  1. 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 Aneurysm is directed posteriorly, and demonstrates a fairly narrow neck. Neuro endovascular consultation is recommended.  2. Diffuse irregularity throughout the remaining intracranial arterial vasculature, suspicious for vasospasm secondary to acute subarachnoid hemorrhage.    Cerbral Angiogram / Coiling - Dr Kathyrn Sheriff 12/25/2016 1. 7.5 x 6.34mm right Pcom aneurysm projecting posteriorly, coiled successfully without aneurysm residual. 2. No significant vasospasm seen 3. No other intracranial aneurysms, AVM, or fistulas seen   Ct Head Wo Contrast 12/25/2016 Diffuse subarachnoid hemorrhage, filling CSF spaces in the suprasellar and basal cisterns as well as bilateral sulci. Cause is not determined but presents high suspicion for ruptured intracranial aneurysm.   TTE - Left ventricle: The cavity size was normal. Systolic function was   normal. The estimated ejection fraction was in the range of 55%   to 60%. Wall motion was normal; there were no regional wall   motion abnormalities. Doppler parameters are consistent with   abnormal left ventricular relaxation (grade 1 diastolic   dysfunction). Acoustic contrast opacification revealed no   evidence ofthrombus.  TCD 12/26/16 - no evidence  of vasospasm  TCD 12/28/16 - elevated velocity at right MCA and ACA.  Mr Brain and C-spine Wo Contrast 12/26/2016 IMPRESSION: 1. 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 More confluent acute infarcts in the high frontoparietal regions bilaterally. 2. Subarachnoid and small volume intraventricular hemorrhage. 3. Mild cervical spondylosis.  No spinal stenosis. 4. Unremarkable appearance of the cervical spinal cord.   MRI brain and C spine  12/30/2016 : 1. Overall similar appearance of scattered bilateral  embolic infarcts and confluent cortical infarcts at the vertex bilaterally. 2. No new infarcts. 3. Decreasing subarachnoid and intraventricular hemorrhage. 4. Unremarkable appearance of the cervical spinal cord  Transcranial Doppler  Date POD PCO2 HCT BP  MCA ACA PCA OPHT SIPH VERT Basilar  5/23 rs     Right  Left   76  44   -52  -48   42  31   27  41   29  48   *  -37   -35      5/25 je     Right  Left   103  118   -95  -94   58  49   37  48   51  72   -35  *   -34      5/28 je     Right  Left   101  113   -53  -68   34  -36   33  42   56  72   -42  *   *       PHYSICAL EXAM  Temp:  [97.7 F (36.5 C)-98.9 F (37.2 C)] 98.2 F (36.8 C) (05/31 1136) Pulse Rate:  [82-113] 85 (05/31 0900) Resp:  [12-24] 12 (05/31 0900) BP: (122-168)/(77-140) 126/79 (05/31 0900) SpO2:  [94 %-100 %] 99 % (05/31 0900) Weight:  [202 lb 2.6 oz (91.7 kg)] 202 lb 2.6 oz (91.7 kg) (05/31 0500)  General - Well nourished, well developed, intubated and sedated. Cannot decrease propofol due to agitation.   Ophthalmologic - Fundi not visualized due to noncooperation.  Cardiovascular - Regular rate and rhythm.  Neuro - awake alert eyes open and alert  Soft hoarse voice and speaks short sentences.Pupils pinpoint but reactive. right gaze preference, does not blink to threat on the left, positive corneal and gag. No movement on stim Left, can squeeze fingers on the right.Able to wiggle her toes in both legs right more than left.  DTR diminished and equivocal toes. Nods yes to sensation on the right arm and leg but not on the left arm and leg..    ASSESSMENT/PLAN Aimee [name] Plan is a 41 y.o. female with history of hypertension and diabetes mellitus presenting with tetraplegia and altered mental status. She did not receive IV t-PA due to subarachnoid hemorrhage.  SAH due to 7 x 6 x 5 mm right posterior communicating artery aneurysm rupture  s/p coiling Dr Kathyrn Sheriff 12/25/2016  CT head - Diffuse basal subarachnoid hemorrhage.  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  CTA neck - normal  Cerebral angio - no vasospasm.  TCD 12/28/16 increased velocity at right MCA and ACA  2D Echo - EF 55-60%  VTE prophylaxis - SCDs DIET DYS 3 Room service appropriate? Yes; Fluid consistency: Nectar Thick  No antithrombotic prior to admission, now on No antithrombotic secondary to [place].  S/p right PCOM coiling by Dr. 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  LP - Xanthochromia. No sign of infection  Still no good explanation of quadriplegia - may related to "brainstem shock" due to concentrated basal cistern blood product - expect pt symptom will improve over time if this is the case  Hypertension  Stable - IV Cardene  BP goal < 160  Long-term BP goal normotensive  Other Stroke Risk Factors  Obesity, Body mass index is 34.7 kg/m., recommend weight loss, diet and exercise as appropriate   Other Active Problems  Leukocytosis - 16.1 -> 23.6 -> 20.6 -> 18.5 -> 16.6 (temp 99.4 Ax) (on Decadron)  IV Rocephin / vancomycin   PLAN   Mobilize out of bed. Physical occupational, speech therapy and rehabilitation consults. Expect slow neurological improvement. Long discussion with the patient   and answered questions. Plan to transfer to neurology floor and we have over the next few days. Discussed with Dr. Kathyrn Sheriff No  Family at bedside today Hospital day # 9  This patient is critically ill due to diffuse basal SAH, ruptured PCOM aneurysm, HTN, quadriplegia and at significant risk of neurological  worsening, death form recurrent SAH, vasospasm, hydrocephalus, seizure. This patient's care requires constant monitoring of vital signs, hemodynamics, respiratory and cardiac monitoring, of multiple databases, neurological assessment, discussion with family, other specialists and medical decision making of high complexity. I spent 30 minutes of neurocritical care time in the care of this patient.   Antony Contras, MD Medical Director [facility] Pager: [phone] 01/03/2017 1:17 PM To contact Stroke Continuity provider, please refer to [email or web address] After hours, contact General Neurology

## 2017-01-03 NOTE — Progress Notes (Signed)
  Speech Language Pathology Treatment: Dysphagia  Patient Details Name: Aimee [name] MRN: [id number] DOB: 12/21/1975 Today's Date: 01/03/2017 Time:  -     Assessment / [name] / Recommendation Clinical Impression  Per all accounts pt is tolerating diet well. Under SLp supervision pt consumed 8 oz of nectar thick water with one cough. Remains dysphonic but more cheerful and expressive today. [name] for respiratory muscle strength training tomorrow.   HPI HPI: 41 year old female presents with N/V and Confusion. 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 Underwent coiling, Intubated from 5/22 to 5/28.       SLP [name]          Recommendations  Diet recommendations: Dysphagia 3 (mechanical soft);Nectar-thick liquid Liquids provided via: Cup;Straw Medication Administration: Whole meds with puree Supervision: Full supervision/cueing for compensatory strategies Compensations: Multiple dry swallows after each bite/sip                General recommendations: Rehab consult Oral Care Recommendations: Oral care QID Follow up Recommendations: Inpatient Rehab SLP Visit Diagnosis: Dysphagia, pharyngeal phase (R13.13)       GO              Aimee [place], MA CCC-SLP [phone]   Lynann Beaver 01/03/2017, 3:29 PM

## 2017-01-03 NOTE — Progress Notes (Signed)
Nutrition Follow-up  DOCUMENTATION CODES:   Obesity unspecified  INTERVENTION:   D/C Prostat, Jevity 1.2, and MVI  Magic cup TID with meals, each supplement provides 290 kcal and 9 grams of protein   NUTRITION DIAGNOSIS:   Swallowing difficulty related to  [facility]) as evidenced by  (per SLP report). Ongoing.  GOAL:   Patient will meet greater than or equal to 90% of their needs  Progressing.   MONITOR:   PO intake, Diet advancement   ASSESSMENT:   Pt with hx of DM admitted with [facility] s/p coiling.   Pt discussed during ICU rounds and with RN.  5/28 extubated 5/30 started diet Per RN who fed pt this am she consumed > 75% of her meal tray.  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 Plan for CIR referral, per case manager pt is undocumented and working through process.   Diet Order:  DIET DYS 3 Room service appropriate? Yes; Fluid consistency: Nectar Thick  Skin:  Reviewed, no issues  Last BM:  5/30  Height:   Ht Readings from Last 1 Encounters:  12/25/16 5\' 4"  (1.626 m)    Weight:   Wt Readings from Last 1 Encounters:  01/03/17 202 lb 2.6 oz (91.7 kg)    Ideal Body Weight:  54.5 kg  BMI:  Body mass index is 34.7 kg/m.  Estimated Nutritional Needs:   Kcal:  1700-1900  Protein:  100-110 grams  Fluid:  > 1.7 L/day  EDUCATION NEEDS:   No education needs identified at this time  [place], [place], [place] Pager [phone] After Hours Pager

## 2017-01-04 ENCOUNTER — Inpatient Hospital Stay (HOSPITAL_COMMUNITY): Payer: Medicaid Other

## 2017-01-04 DIAGNOSIS — R569 Unspecified convulsions: Secondary | ICD-10-CM

## 2017-01-04 LAB — GLUCOSE, CAPILLARY
Glucose-Capillary: 140 mg/dL — ABNORMAL HIGH (ref 65–99)
Glucose-Capillary: 152 mg/dL — ABNORMAL HIGH (ref 65–99)
Glucose-Capillary: 178 mg/dL — ABNORMAL HIGH (ref 65–99)
Glucose-Capillary: 178 mg/dL — ABNORMAL HIGH (ref 65–99)
Glucose-Capillary: 240 mg/dL — ABNORMAL HIGH (ref 65–99)

## 2017-01-04 NOTE — Progress Notes (Signed)
Physical Therapy Treatment Patient Details Name: Aimee [name] MRN: [id number] DOB: [birth year] Today's Date: 01/04/2017    History of Present Illness Patient is a 41 y/o female presents from home with N/V, confusion. Head CT-diffuse SAH. 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 More confluent acute infarcts in the high frontoparietal regions bilaterally. Intubated 5/22-5/28.    PT Comments    Patient in good spirits today. Showing improvement in strength in BUEs/LEs. Pt able to perform LAQ on RLE against gravity sitting in chair. Still has trace quadriceps in LLE and knee flexion ~2/5 LLE spontaneously. Seems to have difficulty with motor planning throughout. Able to grip with left hand and demonstrates some extensor tone in LUE but able to provide some activation of triceps and biceps with increased time and assist once tone is broken. Used lift to transfer to chair. BP stable with LEs wrapped with ace wraps.  Continues to improve. Will follow.   Follow Up Recommendations  CIR;Supervision for mobility/OOB;Supervision/Assistance - 24 hour     Equipment Recommendations  Other (comment)    Recommendations for Other Services       Precautions / Restrictions Precautions Precautions: Fall Precaution Comments: quadriplegia; keep MAP 100; PRAFOs Required Braces or Orthoses: Other Brace/Splint Restrictions Weight Bearing Restrictions: No    Mobility  Bed Mobility Overal bed mobility: Needs Assistance Bed Mobility: Rolling Rolling: Max assist;+2 for physical assistance         General bed mobility comments: Rolling to right/left with assist of 2 to place lift pad.   Transfers Overall transfer level: Needs assistance               General transfer comment: used sky lift to get pt up in chair.  Ambulation/Gait                  Stairs            Wheelchair Mobility    Modified Rankin (Stroke Patients Only) Modified Rankin (Stroke Patients Only) Pre-Morbid Rankin Score: No symptoms Modified Rankin: Severe disability     Balance                                            Cognition Arousal/Alertness: Awake/alert Behavior During Therapy: WFL for tasks assessed/performed Overall Cognitive Status: Impaired/Different from baseline Area of Impairment: Attention;Following commands;Awareness;Problem solving                   Current Attention Level: Selective   Following Commands: Follows one step commands with increased time   Awareness: Emergent Problem Solving: Slow processing;Difficulty sequencing General Comments: Pt seems to have some motor planning issues. Conversant today appropriately. Delayed processing at times.       Exercises Other Exercises Other Exercises: Shoulder shrugs x5 in bed  Other Exercises: after inhibiting extensor tone LUE, assisted with push/pull, gripping left hand Other Exercises: Able to perform LAQ in chair RLE x3; left knee flexion spontaneously (as overflow) x2; wiggles toes bilaterally    General Comments General comments (skin integrity, edema, etc.): BP stable with BLEs wrapped.       Pertinent Vitals/Pain Pain Assessment: No/denies pain    Home Living  Prior Function            PT Goals (current goals can now be found in the care [name] section) Progress towards PT goals: Progressing toward goals    Frequency    Min 3X/week      PT [name] Current [name] remains appropriate    Co-evaluation PT/OT/SLP Co-Evaluation/Treatment: Yes Reason for Co-Treatment: Complexity of the patient's impairments (multi-system involvement);To address functional/ADL transfers;For patient/therapist safety PT goals addressed during session: Mobility/safety with mobility;Strengthening/ROM        AM-PAC PT  "6 Clicks" Daily Activity  Outcome Measure  Difficulty turning over in bed (including adjusting bedclothes, sheets and blankets)?: Total Difficulty moving from lying on back to sitting on the side of the bed? : Total   Help needed moving to and from a bed to chair (including a wheelchair)?: Total Help needed walking in hospital room?: Total Help needed climbing 3-5 steps with a railing? : Total 6 Click Score: 5    End of Session   Activity Tolerance: Patient tolerated treatment well Patient left: in chair;with call bell/phone within reach;with family/visitor present Nurse Communication: Mobility status;Need for lift equipment PT Visit Diagnosis: Hemiplegia and hemiparesis Hemiplegia - Right/Left:  (bilateral) Hemiplegia - caused by: Nontraumatic SAH;Cerebral infarction     Time: 3570-1779 PT Time Calculation (min) (ACUTE ONLY): 27 min  Charges:  $Therapeutic Exercise: 8-22 mins                    G Codes:       Aimee [name], PT, DPT [phone]     Aimee [name] 01/04/2017, 11:35 AM

## 2017-01-04 NOTE — Progress Notes (Signed)
Matt RN will remove the central line [employer]

## 2017-01-04 NOTE — Progress Notes (Signed)
STROKE TEAM PROGRESS NOTE   SUBJECTIVE (INTERVAL HISTORY) Patient`s condition Continues to improve and today she is able to move right upper extremity against gravity and he will grip with the left hand and move her toes in both legs OBJECTIVE Temp:  [98.2 F (36.8 C)-98.7 F (37.1 C)] 98.2 F (36.8 C) (06/01 1200) Pulse Rate:  [69-113] 98 (06/01 1400) Cardiac Rhythm: Normal sinus rhythm (06/01 0800) Resp:  [14-24] 21 (06/01 1400) BP: (129-149)/(81-101) 134/84 (06/01 1400) SpO2:  [94 %-100 %] 100 % (06/01 1400) Weight:  [197 lb 15.6 oz (89.8 kg)] 197 lb 15.6 oz (89.8 kg) (06/01 0500)  CBC:   Recent Labs Lab 12/29/16 0430  01/02/17 0416 01/03/17 0545  WBC 18.5*  < > 25.5* 27.7*  NEUTROABS 16.3*  --   --   --   HGB 11.9*  < > 13.9 14.1  HCT 37.6  < > 41.4 42.1  MCV 87.0  < > 83.8 84.7  PLT 269  < > 274 256  < > = values in this interval not displayed.  Basic Metabolic Panel:   Recent Labs Lab 01/01/17 0400 01/02/17 0416 01/03/17 0545  NA 137 136 136  K 3.4* 3.8 4.0  CL 102 106 104  CO2 25 22 21*  GLUCOSE 138* 189* 143*  BUN 18 22* 18  CREATININE 0.46 0.50 0.49  CALCIUM 8.2* 8.2* 8.4*  MG 1.9  --  1.9  PHOS 3.8  --  3.5    Lipid Panel:     Component Value Date/Time   TRIG 378 (H) 12/31/2016 0415   HgbA1c: No results found for: HGBA1C Urine Drug Screen:     Component Value Date/Time   LABOPIA NONE DETECTED 12/25/2016 0913   COCAINSCRNUR NONE DETECTED 12/25/2016 0913   LABBENZ POSITIVE (A) 12/25/2016 0913   AMPHETMU NONE DETECTED 12/25/2016 0913   THCU NONE DETECTED 12/25/2016 0913   LABBARB NONE DETECTED 12/25/2016 0913    Alcohol Level No results found for: [place] I have personally reviewed the radiological images below and agree with the radiology interpretations.  Ct Angio Head W Or Wo Contrast Ct Angio Neck W And/or Wo Contrast 12/25/2016  CTA NECK IMPRESSION:  Normal CTA of the neck.   CTA HEAD IMPRESSION:  1. 7 x 6 x 5 mm right  posterior communicating artery aneurysm. Aneurysm is directed posteriorly, and demonstrates a fairly narrow neck. Neuro endovascular consultation is recommended.  2. Diffuse irregularity throughout the remaining intracranial arterial vasculature, suspicious for vasospasm secondary to acute subarachnoid hemorrhage.    Cerbral Angiogram / Coiling - Dr Kathyrn Sheriff 12/25/2016 1. 7.5 x 6.13mm right Pcom aneurysm projecting posteriorly, coiled successfully without aneurysm residual. 2. No significant vasospasm seen 3. No other intracranial aneurysms, AVM, or fistulas seen   Ct Head Wo Contrast 12/25/2016 Diffuse subarachnoid hemorrhage, filling CSF spaces in the suprasellar and basal cisterns as well as bilateral sulci. Cause is not determined but presents high suspicion for ruptured intracranial aneurysm.   TTE - Left ventricle: The cavity size was normal. Systolic function was   normal. The estimated ejection fraction was in the range of 55%   to 60%. Wall motion was normal; there were no regional wall   motion abnormalities. Doppler parameters are consistent with   abnormal left ventricular relaxation (grade 1 diastolic   dysfunction). Acoustic contrast opacification revealed no   evidence ofthrombus.  TCD 12/26/16 - no evidence of vasospasm  TCD 12/28/16 - elevated velocity at right MCA and ACA.  Mr Brain and C-spine Wo Contrast 12/26/2016 IMPRESSION: 1. 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 More confluent acute infarcts in the high frontoparietal regions bilaterally. 2. Subarachnoid and small volume intraventricular hemorrhage. 3. Mild cervical spondylosis.  No spinal stenosis. 4. Unremarkable appearance of the cervical spinal cord.   MRI brain and C spine  12/30/2016 : 1. Overall similar appearance of scattered bilateral embolic infarcts and confluent cortical infarcts at the vertex bilaterally. 2. No new infarcts. 3. Decreasing subarachnoid and  intraventricular hemorrhage. 4. Unremarkable appearance of the cervical spinal cord  Transcranial Doppler  Date POD PCO2 HCT BP  MCA ACA PCA OPHT SIPH VERT Basilar  5/23 rs     Right  Left   76  44   -52  -48   42  31   27  41   29  48   *  -37   -35      5/25 je     Right  Left   103  118   -95  -94   58  49   37  48   51  72   -35  *   -34      5/28 je     Right  Left   101  113   -53  -68   34  -36   33  42   56  72   -42  *   *       PHYSICAL EXAM  Temp:  [98.2 F (36.8 C)-98.7 F (37.1 C)] 98.2 F (36.8 C) (06/01 1200) Pulse Rate:  [69-113] 98 (06/01 1400) Resp:  [14-24] 21 (06/01 1400) BP: (129-149)/(81-101) 134/84 (06/01 1400) SpO2:  [94 %-100 %] 100 % (06/01 1400) Weight:  [197 lb 15.6 oz (89.8 kg)] 197 lb 15.6 oz (89.8 kg) (06/01 0500)  General - Well nourished, well developed, intubated and sedated. Cannot decrease propofol due to agitation.   Ophthalmologic - Fundi not visualized due to noncooperation.  Cardiovascular - Regular rate and rhythm.  Neuro - awake alert eyes open and alert  Soft hoarse voice and speaks short sentences.Pupils pinpoint but reactive. right gaze preference, does not blink to threat on the left, positive corneal and gag. Able to grip with the right hand and moves the right elbow and even move the right upper extremity partially against gravity. She is able to grip partially with the left hand. She can wiggle her toes in both legs. t.  DTR diminished and equivocal toes. Nods yes to sensation on the right arm and leg but not on the left arm and leg..    ASSESSMENT/PLAN Ms. [name] Plan is a 40 y.o. female with history of hypertension and diabetes mellitus presenting with tetraplegia and altered mental status. She did not receive IV t-PA due to subarachnoid hemorrhage.  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  CTA neck - normal  Cerebral angio - no vasospasm.  TCD 12/28/16 increased velocity at right MCA and ACA  2D Echo - EF 55-60%  VTE prophylaxis - SCDs DIET DYS 3 Room service appropriate? Yes; Fluid consistency: Nectar Thick  No antithrombotic prior to admission, now on No antithrombotic secondary to [place].  S/p right PCOM coiling by Dr. Kathyrn Sheriff  On  keppra for seizure prophylaxis  On nimodipine 60mg  Q4h  Ongoing aggressive stroke risk factor management  Therapy recommendations: pending  Disposition: Pending  Quadriplegia  LUE and LLE and RLE no movement, RUE 3/5  MRI brain and C-spine Scattered acute embolic infarcts in both cerebral hemispheres and left cerebellum.  LP - Xanthochromia. No sign of infection  Still no good explanation of quadriplegia - may related to "brainstem shock" due to concentrated basal cistern blood product - expect pt symptom will improve over time if this is the case  Hypertension  Stable - IV Cardene  BP goal < 160  Long-term BP goal normotensive  Other Stroke Risk Factors  Obesity, Body mass index is 33.98 kg/m., recommend weight loss, diet and exercise as appropriate   Other Active Problems  Leukocytosis - 16.1 -> 23.6 -> 20.6 -> 18.5 -> 16.6 (temp 99.4 Ax) (on Decadron)  IV Rocephin / vancomycin   PLAN   Continue Physical occupational, speech therapy and rehabilitation transfer when bed available. Expect slow neurological improvement. Long discussion with the patient   and answered questions. Plan to transfer to neurology floor   over the next few days.   No  Family at bedside today Hospital day # 10  This patient is critically ill due to diffuse basal SAH, ruptured PCOM aneurysm, HTN, quadriplegia and at significant risk of neurological worsening, death form recurrent SAH, vasospasm,  hydrocephalus, seizure. This patient's care requires constant monitoring of vital signs, hemodynamics, respiratory and cardiac monitoring, of multiple databases, neurological assessment, discussion with family, other specialists and medical decision making of high complexity. I spent 30 minutes of neurocritical care time in the care of this patient.   Antony Contras, MD Medical Director [place] Pager: [phone] 01/04/2017 3:05 PM To contact Stroke Continuity provider, please refer to [email or web address] After hours, contact General Neurology

## 2017-01-04 NOTE — Progress Notes (Signed)
No issues overnight. Pt has no complaints. Working with PT yesterday.  EXAM:  BP (!) 139/94   Pulse 85   Temp 98.6 F (37 C) (Oral)   Resp 20   Ht 5\' 4"  (1.626 m)   Wt 89.8 kg (197 lb 15.6 oz)   LMP 12/31/2016 Comment: vented pt sah  SpO2 99%   BMI 33.98 kg/m   Awake, alert, oriented  Speech fluent, appropriate  CN grossly intact  3/5 right elbow flexion, 2/5 extension. Squeeze with left hand Wiggles toes in right foot  IMPRESSION:  41 y.o. female [place] d# 11 s/p right Pcom coiling. Quadriparesis is slowly but progressively improving.  PLAN: - Would be stable for d/c to CIR today if bed available - Will cont Nimotop for total of 21 days.

## 2017-01-04 NOTE — Progress Notes (Signed)
Inpatient Rehabilitation  Met with patient and brother at beside today to discuss  IP Rehab goals and expectations.  Brother, [name] asked appropriate questions, that were answered.  Plan for my co-worker Gerlean Ren to follow up Monday to further assist with disposition planning.    Carmelia Roller., CCC/SLP Admission Coordinator  [place]  Cell [phone]

## 2017-01-04 NOTE — Progress Notes (Signed)
  Speech Language Pathology Treatment: Cognitive-Linquistic;Dysphagia  Patient Details Name: [name] Plan MRN: [id number] DOB: [birth year] Today's Date: 01/04/2017 Time: 3546-5681 SLP Time Calculation (min) (ACUTE ONLY): 30 min  Assessment / Plan / Recommendation Clinical Impression  Pt seen for therapeutic interventions to target pharyngeal and laryngeal strength for airway protection and phonation. Initiated respiratory muscle strength training, pt observed to have a MEP of 29 which is well below LLN for her age. Her MIP was WNL. Guided pt in use of EMST 150 trainer with 40 reps of expiratory exercises completed with trainer set to 20 cm H2O. Pt was initially successful with 30 cm H20, but fatigued. SLP instructed pt and family in exercise program and encouraged her to complete 30 reps 3x a day. SLP also attempted verbal cues to achieve audible adduction of vocal cords, pt unable to hum, clear throat or say "UH" with force. Will continue efforts. Recommend CIR at d/c.   HPI HPI: 41 year old female presents with N/V and Confusion. 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 Underwent coiling, Intubated from 5/22 to 5/28.       SLP Plan  Continue with current plan of care       Recommendations  Diet recommendations: Dysphagia 3 (mechanical soft);Nectar-thick liquid Liquids provided via: Cup;Straw Medication Administration: Whole meds with puree Supervision: Full supervision/cueing for compensatory strategies Compensations: Multiple dry swallows after each bite/sip                General recommendations: Rehab consult Oral Care Recommendations: Oral care BID Follow up Recommendations: Inpatient Rehab SLP Visit Diagnosis: Dysphagia, pharyngeal phase (R13.13) Plan: Continue with current plan of care       GO               [facility], MA CCC-SLP [phone]  Lynann Beaver 01/04/2017, 2:26 PM

## 2017-01-04 NOTE — Progress Notes (Signed)
Occupational Therapy Treatment Patient Details Name: Aimee [name] MRN: [id number] DOB: [birth year] Today's Date: 01/04/2017    History of present illness Patient is a 41 y/o female presents from home with N/V, confusion. Head CT-diffuse SAH. 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 More confluent acute infarcts in the high frontoparietal regions bilaterally. Intubated 5/22-5/28.   OT comments  Pt oob to chair this session with BIL UE movement noted this session. Pt able to scratch face with R UE. Pt with delayed motor response to commands but more automatic with functional need. Pt hoyer lift to chair for 2 hours max.  Recommend GEO mat for chair .   Follow Up Recommendations  CIR;Supervision/Assistance - 24 hour    Equipment Recommendations  Other (comment)    Recommendations for Other Services Rehab consult    Precautions / Restrictions Precautions Precautions: Fall Precaution Comments: quadriplegia; keep MAP 100; PRAFOs Required Braces or Orthoses: Other Brace/Splint Other Brace/Splint: prafo boots Restrictions Weight Bearing Restrictions: No       Mobility Bed Mobility Overal bed mobility: Needs Assistance Bed Mobility: Rolling Rolling: Max assist;+2 for physical assistance         General bed mobility comments: Rolling to right/left with assist of 2 to place lift pad.   Transfers Overall transfer level: Needs assistance               General transfer comment: used sky lift to get pt up in chair.    Balance Overall balance assessment: Needs assistance Sitting-balance support: Feet supported;No upper extremity supported Sitting balance-Leahy Scale: Zero Sitting balance - Comments: requires pillows bil sides due to lateral lean                                   ADL either performed or  assessed with clinical judgement   ADL Overall ADL's : Needs assistance/impaired Eating/Feeding: Moderate assistance Eating/Feeding Details (indicate cue type and reason): tech in room (A) with breakfast Grooming: Moderate assistance Grooming Details (indicate cue type and reason): pt able to bring hand to mouth and scratcing face             Lower Body Dressing: Total assistance Lower Body Dressing Details (indicate cue type and reason): ace wraps x2 per LE and socks don               General ADL Comments: pt hoyer lift to chair at this time. Noted L UE tone present and delayed motor response     Vision       Perception     Praxis      Cognition Arousal/Alertness: Awake/alert Behavior During Therapy: WFL for tasks assessed/performed Overall Cognitive Status: Impaired/Different from baseline Area of Impairment: Attention;Following commands;Awareness;Problem solving                   Current Attention Level: Selective   Following Commands: Follows one step commands with increased time   Awareness: Emergent Problem Solving: Slow processing;Difficulty sequencing General Comments: Pt seems to have some motor planning issues. Conversant today appropriately. Delayed processing at times.         Exercises Other Exercises Other Exercises: shoulder shrugs Other Exercises: shoulder flexion ( bicep tricep ) R UE Other Exercises: shoulder flexion AAROM L UE with tone present   Shoulder Instructions  General Comments BP stable and BLE wrapped    Pertinent Vitals/ Pain       Pain Assessment: No/denies pain  Home Living                                          Prior Functioning/Environment              Frequency  Min 3X/week        Progress Toward Goals  OT Goals(current goals can now be found in the care [name] section)  Progress towards OT goals: Progressing toward goals  Acute Rehab OT Goals Patient Stated Goal: to  get better OT Goal Formulation: With patient Time For Goal Achievement: 01/15/17 Potential to Achieve Goals: Good ADL Goals Pt Will Perform Grooming: with max assist;sitting Pt/caregiver will Perform Home Exercise Program: Both right and left upper extremity;Increased ROM;With written HEP provided;With Supervision Additional ADL Goal #1: Pt will complete bed mobility with maximum assistance in preparation for ADL tasks seated at EOB.  Additional ADL Goal #2: Pt will tolerate sitting at EOB with max assist for approximately 20 minutes for seated ADL tasks with stable vital signs.  Additional ADL Goal #3: Pt will demonstrate ability to direct family/staff in completing of bathing tasks with increased time for processing and no more than 2 VC's. Additional ADL Goal #4: Pt will demonstrate selective attention during ADL tasks for 10 minutes in a non-distracting environment.   [name] Discharge [name] remains appropriate    Co-evaluation    PT/OT/SLP Co-Evaluation/Treatment: Yes Reason for Co-Treatment: Complexity of the patient's impairments (multi-system involvement);Necessary to address cognition/behavior during functional activity;For patient/therapist safety;To address functional/ADL transfers PT goals addressed during session: Mobility/safety with mobility;Strengthening/ROM OT goals addressed during session: ADL's and self-care;Proper use of Adaptive equipment and DME;Strengthening/ROM      AM-PAC PT "6 Clicks" Daily Activity     Outcome Measure   Help from another person eating meals?: A Lot Help from another person taking care of personal grooming?: A Lot Help from another person toileting, which includes using toliet, bedpan, or urinal?: Total Help from another person bathing (including washing, rinsing, drying)?: Total Help from another person to put on and taking off regular upper body clothing?: Total Help from another person to put on and taking off regular lower body clothing?:  Total 6 Click Score: 8    End of Session    OT Visit Diagnosis: Cognitive communication deficit (R41.841);Muscle weakness (generalized) (M62.81);Other symptoms and signs involving the nervous system (R29.898) Symptoms and signs involving cognitive functions: Cerebral infarction   Activity Tolerance Patient tolerated treatment well   Patient Left in chair;with call bell/phone within reach;with chair alarm set;with family/visitor present   Nurse Communication Mobility status;Precautions        Time: 3818-2993 OT Time Calculation (min): 27 min  Charges: OT General Charges $OT Visit: 1 Procedure OT Treatments $Therapeutic Activity: 8-22 mins   Jeri Modena   OTR/L Pager: [phone] Office: [phone] .    Parke Poisson B 01/04/2017, 1:54 PM

## 2017-01-04 NOTE — Progress Notes (Signed)
Transcranial Doppler  Date POD PCO2 HCT BP  MCA ACA PCA OPHT SIPH VERT Basilar  5/23 rs     Right  Left   76  44   -52  -48   42  31   27  41   29  48   *  -37   -35      5/25 je     Right  Left   103  118   -95  -94   58  49   37  48   51  72   -35  *   -34      5/28 je     Right  Left   101  113   -53  -68   34  -36   33  42   56  72   -42  *   *       5/30 VS     Right  Left   56  21   -32  *   30  *   41  41   -42  45   *  *   *  *     6/1 VS     Right  Left   53  41   -33  *   50  *   27  45   38  22   *  *   *  *         Right  Left                                            Right  Left                                        MCA = Middle Cerebral Artery      OPHT = Opthalmic Artery     BASILAR = Basilar Artery   ACA = Anterior Cerebral Artery     SIPH = Carotid Siphon PCA = Posterior Cerebral Artery   VERT = Verterbral Artery                   Normal MCA = 62+\-12 ACA = 50+\-12 PCA = 42+\-23   12/26/16 - * not insonated rds 5/25 [place] ratio: right = 2.8 left = 3.2 JE 5/28 Unable to complete study (left vert and basilar) due to increased movement. Patient was recently extubated. Lindegaard ratio: right = 2.5 left = 2.9 JE 01/02/17 [place]ard Right 1.36 Left not obtained due to values not obtained Technically difficult due to constant movement of the head and eyes.Exam may be inconclusive today. 01/04/17 Patient was much more cooperative today however there was still quite a bit of movement. I was unable to complete a recalculation of the left ACA and PCA Equipment would not save. The ACA peak was 69.6 and diastolic was 29.5 The PCA peak was 28.4 and diastolic was 13.2 Was still unable to insonate the verebral or basilic  Or ICAs due to movement.

## 2017-01-04 NOTE — Progress Notes (Signed)
VASCULAR LAB PRELIMINARY  PRELIMINARY  PRELIMINARY  PRELIMINARY  Transcranial Doppler completed.    Preliminary report:  TCD completed  Kaipo Ardis, RVS 01/04/2017, 5:56 PM

## 2017-01-05 ENCOUNTER — Encounter (HOSPITAL_COMMUNITY): Payer: Self-pay

## 2017-01-05 LAB — GLUCOSE, CAPILLARY
Glucose-Capillary: 121 mg/dL — ABNORMAL HIGH (ref 65–99)
Glucose-Capillary: 134 mg/dL — ABNORMAL HIGH (ref 65–99)
Glucose-Capillary: 142 mg/dL — ABNORMAL HIGH (ref 65–99)
Glucose-Capillary: 152 mg/dL — ABNORMAL HIGH (ref 65–99)
Glucose-Capillary: 156 mg/dL — ABNORMAL HIGH (ref 65–99)
Glucose-Capillary: 162 mg/dL — ABNORMAL HIGH (ref 65–99)
Glucose-Capillary: 180 mg/dL — ABNORMAL HIGH (ref 65–99)

## 2017-01-05 NOTE — Progress Notes (Signed)
STROKE TEAM PROGRESS NOTE   SUBJECTIVE (INTERVAL HISTORY) Patient`s condition Is improving. She has multiple family members at bedside.  OBJECTIVE Temp:  [97.9 F (36.6 C)-99 F (37.2 C)] 98.9 F (37.2 C) (06/02 0411) Pulse Rate:  [61-103] 77 (06/02 0411) Cardiac Rhythm: Normal sinus rhythm (06/02 0759) Resp:  [14-25] 18 (06/02 0411) BP: (125-163)/(73-102) 142/91 (06/02 0411) SpO2:  [96 %-100 %] 98 % (06/02 0411) Weight:  [89.8 kg (197 lb 15.6 oz)] 89.8 kg (197 lb 15.6 oz) (06/02 0411)  CBC:   Recent Labs Lab 01/02/17 0416 01/03/17 0545  WBC 25.5* 27.7*  HGB 13.9 14.1  HCT 41.4 42.1  MCV 83.8 84.7  PLT 274 562    Basic Metabolic Panel:   Recent Labs Lab 01/01/17 0400 01/02/17 0416 01/03/17 0545  NA 137 136 136  K 3.4* 3.8 4.0  CL 102 106 104  CO2 25 22 21*  GLUCOSE 138* 189* 143*  BUN 18 22* 18  CREATININE 0.46 0.50 0.49  CALCIUM 8.2* 8.2* 8.4*  MG 1.9  --  1.9  PHOS 3.8  --  3.5    Lipid Panel:     Component Value Date/Time   TRIG 378 (H) 12/31/2016 0415   HgbA1c: No results found for: HGBA1C Urine Drug Screen:     Component Value Date/Time   LABOPIA NONE DETECTED 12/25/2016 0913   COCAINSCRNUR NONE DETECTED 12/25/2016 0913   LABBENZ POSITIVE (A) 12/25/2016 0913   AMPHETMU NONE DETECTED 12/25/2016 0913   THCU NONE DETECTED 12/25/2016 0913   LABBARB NONE DETECTED 12/25/2016 0913    Alcohol Level No results found for: [place]ivana Nakya I have personally reviewed the radiological images below and agree with the radiology interpretations.  Ct Angio Head W Or Wo Contrast Ct Angio Neck W And/or Wo Contrast 12/25/2016  CTA NECK IMPRESSION:  Normal CTA of the neck.   CTA HEAD IMPRESSION:  1. 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 Aneurysm is directed posteriorly, and demonstrates a fairly narrow neck. Neuro endovascular consultation is recommended.  2. Diffuse irregularity throughout the remaining intracranial arterial vasculature,  suspicious for vasospasm secondary to acute subarachnoid hemorrhage.    Cerbral Angiogram / Coiling - Dr Kathyrn Sheriff 12/25/2016 1. 7.5 x 6.52mm right Pcom aneurysm projecting posteriorly, coiled successfully without aneurysm residual. 2. No significant vasospasm seen 3. No other intracranial aneurysms, AVM, or fistulas seen   Ct Head Wo Contrast 12/25/2016 Diffuse subarachnoid hemorrhage, filling CSF spaces in the suprasellar and basal cisterns as well as bilateral sulci. Cause is not determined but presents high suspicion for ruptured intracranial aneurysm.   TTE - Left ventricle: The cavity size was normal. Systolic function was   normal. The estimated ejection fraction was in the range of 55%   to 60%. Wall motion was normal; there were no regional wall   motion abnormalities. Doppler parameters are consistent with   abnormal left ventricular relaxation (grade 1 diastolic   dysfunction). Acoustic contrast opacification revealed no   evidence ofthrombus.  TCD 12/26/16 - no evidence of vasospasm  TCD 12/28/16 - elevated velocity at right MCA and ACA.  Mr Brain and C-spine Wo Contrast 12/26/2016 IMPRESSION: 1. 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 More confluent acute infarcts in the high frontoparietal regions bilaterally. 2. Subarachnoid and small volume intraventricular hemorrhage. 3. Mild cervical spondylosis.  No spinal stenosis. 4. Unremarkable appearance of the cervical spinal cord.   MRI brain and C spine  12/30/2016 :  1. Overall similar appearance of scattered bilateral embolic infarcts and confluent cortical infarcts at the vertex bilaterally. 2. No new infarcts. 3. Decreasing subarachnoid and intraventricular hemorrhage. 4. Unremarkable appearance of the cervical spinal cord  Transcranial Doppler  Date POD PCO2 HCT BP  MCA ACA PCA OPHT SIPH VERT Basilar  5/23 rs     Right  Left   76  44   -52  -48   42  31   27  41    29  48   *  -37   -35      5/25 je     Right  Left   103  118   -95  -94   58  49   37  48   51  72   -35  *   -34      5/28 je     Right  Left   101  113   -53  -68   34  -36   33  42   56  72   -42  *   *       PHYSICAL EXAM  Temp:  [97.9 F (36.6 C)-99 F (37.2 C)] 98.9 F (37.2 C) (06/02 0411) Pulse Rate:  [61-103] 77 (06/02 0411) Resp:  [14-25] 18 (06/02 0411) BP: (125-163)/(73-102) 142/91 (06/02 0411) SpO2:  [96 %-100 %] 98 % (06/02 0411) Weight:  [89.8 kg (197 lb 15.6 oz)] 89.8 kg (197 lb 15.6 oz) (06/02 0411)  General - Well nourished, well developed, intubated and sedated. Cannot decrease propofol due to agitation.   Ophthalmologic - Fundi not visualized due to noncooperation.  Cardiovascular - Regular rate and rhythm.  Neuro - awake alert eyes open and alert  Soft hoarse voice and speaks short sentences.Pupils pinpoint but reactive. right gaze preference, does not blink to threat on the left, positive corneal and gag. Able to grip with the right hand and moves the right elbow and even move the right upper extremity partially against gravity. She is able to grip partially with the left hand. She can wiggle her toes in both legs. t.  DTR diminished and equivocal toes. Nods yes to sensation on the right arm and leg but not on the left arm and leg..    ASSESSMENT/PLAN Ms. [name] Plan is a 41 y.o. female with history of hypertension and diabetes mellitus presenting with tetraplegia and altered mental status. She did not receive IV t-PA due to subarachnoid hemorrhage.  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  CTA neck - normal  Cerebral angio - no vasospasm.  TCD 12/28/16 increased velocity at right MCA and ACA  2D Echo  - EF 55-60%  VTE prophylaxis - SCDs DIET DYS 3 Room service appropriate? Yes; Fluid consistency: Nectar Thick  No antithrombotic prior to admission, now on No antithrombotic secondary to Chambers.  S/p right PCOM coiling by Dr. 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  LP - Xanthochromia. No sign of infection  Still no good  explanation of quadriplegia - may related to "brainstem shock" due to concentrated basal cistern blood product - expect pt symptom will improve over time if this is the case  Hypertension  Stable - IV Cardene  BP goal < 160  Long-term BP goal normotensive  Other Stroke Risk Factors  Obesity, Body mass index is 33.98 kg/m., recommend weight loss, diet and exercise as appropriate   Other Active Problems  Leukocytosis - 16.1 -> 23.6 -> 20.6 -> 18.5 -> 16.6 (temp 99.4 Ax) (on Decadron)  IV Rocephin / vancomycin   PLAN   Continue Physical occupational, speech therapy and rehabilitation transfer when bed available. Expect slow neurological improvement. Long discussion with the patient   and answered questions. Plan to transfer to inpatient rehabilitation  over the next few days.   Multiple family members at bedside today. Stroke team will sign off. Follow-up as an outpatient in the stroke clinic. Kindly call for questions Hospital day # [place], [place] Pager: [phone] 01/05/2017 12:46 PM   To contact Stroke Continuity provider, please refer to [email or web address] After hours, contact General Neurology

## 2017-01-05 NOTE — Plan of Care (Signed)
Problem: Coping: Goal: Ability to identify appropriate support needs will improve Outcome: Progressing Family with patient often  Problem: Self-Care: Goal: Ability to participate in self-care as condition permits will improve Outcome: Not Progressing Can move more than is doing currently, encouraging to move as much as possible.  Goal: Ability to communicate needs accurately will improve Outcome: Progressing Able to use call bell when positioned properly verbalizing needs  Problem: Nutrition: Goal: Risk of aspiration will decrease Outcome: Progressing Encouraged chin tuck when swallowing al food thickened, on dyphagia 3 diet, mechanical soft Goal: Dietary intake will improve Outcome: Progressing Needs to be fed, however encouraging to use utensils

## 2017-01-05 NOTE — Progress Notes (Signed)
Received patient from [place] at [facility]- patient incontinent several times, bed/ linen change, patient started asking for bedpan, only able to move hands some, legs with very little movement. Total care. Hair washed, family updated.

## 2017-01-05 NOTE — Progress Notes (Signed)
Pt seen and examined.  No issues overnight. No complains. Working with PT/OT  EXAM: Temp:  [97.9 F (36.6 C)-99 F (37.2 C)] 98.9 F (37.2 C) (06/02 0411) Pulse Rate:  [61-103] 77 (06/02 0411) Resp:  [14-25] 18 (06/02 0411) BP: (125-163)/(73-102) 142/91 (06/02 0411) SpO2:  [96 %-100 %] 98 % (06/02 0411) Weight:  [89.8 kg (197 lb 15.6 oz)] 89.8 kg (197 lb 15.6 oz) (06/02 0411) Intake/Output      06/01 0701 - 06/02 0700   I.V. (mL/kg) 1756.3 (19.6)   IV Piggyback 105   Total Intake(mL/kg) 1861.3 (20.7)   Urine (mL/kg/hr) 151 (0.1)   Stool 0 (0)   Total Output 151   Net +1710.3       Urine Occurrence 5 x   Stool Occurrence 2 x    Awake, alert, oriented  Speech fluent, appropriate  CN grossly intact  3/5 right elbow flexion, 2/5 extension. Squeeze with left hand Wiggles toes in right foot  Plan Stable Continue current care Hopefulyl d/c to CIR Monday Continue Nimotop total 21 days

## 2017-01-06 LAB — GLUCOSE, CAPILLARY
Glucose-Capillary: 131 mg/dL — ABNORMAL HIGH (ref 65–99)
Glucose-Capillary: 134 mg/dL — ABNORMAL HIGH (ref 65–99)
Glucose-Capillary: 114 mg/dL — ABNORMAL HIGH (ref 65–99)
Glucose-Capillary: 199 mg/dL — ABNORMAL HIGH (ref 65–99)
Glucose-Capillary: 194 mg/dL — ABNORMAL HIGH (ref 65–99)
Glucose-Capillary: 135 mg/dL — ABNORMAL HIGH (ref 65–99)

## 2017-01-06 MED ORDER — PANTOPRAZOLE SODIUM 40 MG PO PACK
40.0000 mg | PACK | Freq: Every day | ORAL | Status: DC
  Administered 2017-01-07: 40 mg
  Filled 2017-01-06: qty 20

## 2017-01-06 NOTE — Progress Notes (Signed)
Pt seen and examined.  No issues overnight. No complaints this morning  EXAM: Temp:  [97.9 F (36.6 C)-98.7 F (37.1 C)] 98.7 F (37.1 C) (06/03 0425) Pulse Rate:  [69-89] 78 (06/03 0400) Resp:  [15-21] 16 (06/03 0400) BP: (113-138)/(75-90) 138/89 (06/03 0400) SpO2:  [97 %-100 %] 100 % (06/03 0400) Weight:  [88.5 kg (195 lb)] 88.5 kg (195 lb) (06/03 0425) Intake/Output      06/02 0701 - 06/03 0700 06/03 0701 - 06/04 0700   I.V. (mL/kg) 1518.8 (17.2) 150 (1.7)   IV Piggyback 315    Total Intake(mL/kg) 1833.8 (20.7) 150 (1.7)   Urine (mL/kg/hr)     Stool     Total Output       Net +1833.8 +150        Urine Occurrence 4 x    Stool Occurrence 4 x     Awake, alert, oriented  Speech fluent, appropriate  CN grossly intact  Able to move RUE at shoulder above head Squeezes with bilateral hands Wiggles toes in right foot  Plan Stable Continue current care Hopefulyl d/c to CIR Monday Continue Nimotop total 21 days

## 2017-01-07 ENCOUNTER — Inpatient Hospital Stay (HOSPITAL_COMMUNITY)
Admission: RE | Admit: 2017-01-07 | Discharge: 2017-02-05 | DRG: 091 | Disposition: A | Discharge status: Home Health | Payer: Self-pay | Source: Intra-hospital | Attending: Physical Medicine & Rehabilitation | Admitting: Physical Medicine & Rehabilitation

## 2017-01-07 ENCOUNTER — Inpatient Hospital Stay (HOSPITAL_COMMUNITY): Payer: Medicaid Other

## 2017-01-07 DIAGNOSIS — I824Z2 Acute embolism and thrombosis of unspecified deep veins of left distal lower extremity: Secondary | ICD-10-CM | POA: Diagnosis not present

## 2017-01-07 DIAGNOSIS — R2689 Other abnormalities of gait and mobility: Principal | ICD-10-CM | POA: Diagnosis present

## 2017-01-07 DIAGNOSIS — B9620 Unspecified Escherichia coli [E. coli] as the cause of diseases classified elsewhere: Secondary | ICD-10-CM | POA: Diagnosis not present

## 2017-01-07 DIAGNOSIS — I82409 Acute embolism and thrombosis of unspecified deep veins of unspecified lower extremity: Secondary | ICD-10-CM

## 2017-01-07 DIAGNOSIS — I69398 Other sequelae of cerebral infarction: Secondary | ICD-10-CM

## 2017-01-07 DIAGNOSIS — F419 Anxiety disorder, unspecified: Secondary | ICD-10-CM | POA: Diagnosis present

## 2017-01-07 DIAGNOSIS — Z298 Encounter for other specified prophylactic measures: Secondary | ICD-10-CM

## 2017-01-07 DIAGNOSIS — R269 Unspecified abnormalities of gait and mobility: Secondary | ICD-10-CM

## 2017-01-07 DIAGNOSIS — I82441 Acute embolism and thrombosis of right tibial vein: Secondary | ICD-10-CM | POA: Diagnosis not present

## 2017-01-07 DIAGNOSIS — G8250 Quadriplegia, unspecified: Secondary | ICD-10-CM | POA: Diagnosis present

## 2017-01-07 DIAGNOSIS — Z6834 Body mass index (BMI) 34.0-34.9, adult: Secondary | ICD-10-CM

## 2017-01-07 DIAGNOSIS — I8290 Acute embolism and thrombosis of unspecified vein: Secondary | ICD-10-CM

## 2017-01-07 DIAGNOSIS — I608 Other nontraumatic subarachnoid hemorrhage: Secondary | ICD-10-CM | POA: Diagnosis present

## 2017-01-07 DIAGNOSIS — N390 Urinary tract infection, site not specified: Secondary | ICD-10-CM

## 2017-01-07 DIAGNOSIS — Z7984 Long term (current) use of oral hypoglycemic drugs: Secondary | ICD-10-CM

## 2017-01-07 DIAGNOSIS — R569 Unspecified convulsions: Secondary | ICD-10-CM

## 2017-01-07 DIAGNOSIS — I10 Essential (primary) hypertension: Secondary | ICD-10-CM | POA: Diagnosis present

## 2017-01-07 DIAGNOSIS — E119 Type 2 diabetes mellitus without complications: Secondary | ICD-10-CM | POA: Diagnosis present

## 2017-01-07 DIAGNOSIS — D62 Acute posthemorrhagic anemia: Secondary | ICD-10-CM | POA: Diagnosis present

## 2017-01-07 DIAGNOSIS — Z2989 Encounter for other specified prophylactic measures: Secondary | ICD-10-CM

## 2017-01-07 LAB — GLUCOSE, CAPILLARY
Glucose-Capillary: 119 mg/dL — ABNORMAL HIGH (ref 65–99)
Glucose-Capillary: 135 mg/dL — ABNORMAL HIGH (ref 65–99)
Glucose-Capillary: 141 mg/dL — ABNORMAL HIGH (ref 65–99)
Glucose-Capillary: 147 mg/dL — ABNORMAL HIGH (ref 65–99)
Glucose-Capillary: 150 mg/dL — ABNORMAL HIGH (ref 65–99)
Glucose-Capillary: 187 mg/dL — ABNORMAL HIGH (ref 65–99)

## 2017-01-07 MED ORDER — ONDANSETRON HCL 4 MG/2ML IJ SOLN: 4.0000 mg | Freq: Four times a day (QID) | INTRAMUSCULAR | Status: DC | PRN

## 2017-01-07 MED ORDER — NIMODIPINE 60 MG/20ML PO SOLN: 60.0000 mg | ORAL | 0 refills | Status: DC

## 2017-01-07 MED ORDER — TRAMADOL HCL 50 MG PO TABS
50.0000 mg | ORAL_TABLET | Freq: Four times a day (QID) | ORAL | Status: DC | PRN
  Administered 2017-01-13 – 2017-01-16 (×2): 100 mg via ORAL
  Administered 2017-01-16: 50 mg via ORAL
  Administered 2017-01-17 – 2017-01-24 (×10): 100 mg via ORAL
  Administered 2017-01-26 – 2017-01-27 (×2): 50 mg via ORAL
  Administered 2017-01-27: 100 mg via ORAL
  Administered 2017-01-28: 50 mg via ORAL
  Administered 2017-01-28: 100 mg via ORAL
  Administered 2017-01-28: 50 mg via ORAL
  Administered 2017-01-29 – 2017-01-31 (×5): 100 mg via ORAL
  Administered 2017-02-01 (×2): 50 mg via ORAL
  Administered 2017-02-01: 100 mg via ORAL
  Administered 2017-02-02 (×2): 50 mg via ORAL
  Administered 2017-02-03: 100 mg via ORAL
  Administered 2017-02-03: 50 mg via ORAL
  Administered 2017-02-04 (×2): 100 mg via ORAL
  Filled 2017-01-07 (×2): qty 2
  Filled 2017-01-07: qty 1
  Filled 2017-01-07 (×2): qty 2
  Filled 2017-01-07: qty 1
  Filled 2017-01-07: qty 2
  Filled 2017-01-07 (×2): qty 1
  Filled 2017-01-07 (×5): qty 2
  Filled 2017-01-07: qty 1
  Filled 2017-01-07 (×3): qty 2
  Filled 2017-01-07 (×3): qty 1
  Filled 2017-01-07 (×2): qty 2
  Filled 2017-01-07: qty 1
  Filled 2017-01-07 (×10): qty 2

## 2017-01-07 MED ORDER — ORAL CARE MOUTH RINSE
15.0000 mL | Freq: Two times a day (BID) | OROMUCOSAL | Status: DC
  Administered 2017-01-07 – 2017-01-31 (×35): 15 mL via OROMUCOSAL

## 2017-01-07 MED ORDER — ACETAMINOPHEN 325 MG PO TABS
650.0000 mg | ORAL_TABLET | ORAL | Status: DC | PRN
  Administered 2017-01-31 – 2017-02-01 (×4): 650 mg via ORAL
  Filled 2017-01-07 (×4): qty 2

## 2017-01-07 MED ORDER — DEXAMETHASONE 6 MG PO TABS
10.0000 mg | ORAL_TABLET | Freq: Four times a day (QID) | ORAL | Status: DC
  Administered 2017-01-07 – 2017-01-09 (×6): 10 mg via ORAL
  Filled 2017-01-07 (×6): qty 1

## 2017-01-07 MED ORDER — INSULIN ASPART 100 UNIT/ML ~~LOC~~ SOLN
0.0000 [IU] | SUBCUTANEOUS | Status: DC
  Administered 2017-01-07 – 2017-01-08 (×5): 3 [IU] via SUBCUTANEOUS
  Administered 2017-01-08: 4 [IU] via SUBCUTANEOUS
  Administered 2017-01-09 (×3): 3 [IU] via SUBCUTANEOUS

## 2017-01-07 MED ORDER — PANTOPRAZOLE SODIUM 40 MG PO PACK
40.0000 mg | PACK | Freq: Every day | ORAL | Status: DC
  Administered 2017-01-08 – 2017-01-11 (×4): 40 mg via ORAL
  Filled 2017-01-07 (×3): qty 20

## 2017-01-07 MED ORDER — PREDNISONE 10 MG (21) PO TBPK: ORAL_TABLET | ORAL | Status: DC

## 2017-01-07 MED ORDER — NIMODIPINE 60 MG/20ML PO SOLN
60.0000 mg | ORAL | Status: AC
  Administered 2017-01-09 – 2017-01-12 (×13): 60 mg
  Filled 2017-01-07 (×15): qty 20

## 2017-01-07 MED ORDER — DEXAMETHASONE 6 MG PO TABS: 10.0000 mg | ORAL_TABLET | Freq: Four times a day (QID) | ORAL | Status: DC

## 2017-01-07 MED ORDER — DOCUSATE SODIUM 50 MG/5ML PO LIQD
100.0000 mg | Freq: Two times a day (BID) | ORAL | Status: DC
  Administered 2017-01-07 – 2017-01-11 (×7): 100 mg via ORAL
  Filled 2017-01-07 (×8): qty 10

## 2017-01-07 MED ORDER — LEVETIRACETAM 500 MG PO TABS: 500.0000 mg | ORAL_TABLET | Freq: Two times a day (BID) | ORAL | Status: DC

## 2017-01-07 MED ORDER — ACETAMINOPHEN 650 MG RE SUPP: 650.0000 mg | RECTAL | Status: DC | PRN

## 2017-01-07 MED ORDER — SORBITOL 70 % SOLN: 30.0000 mL | Freq: Every day | Status: DC | PRN

## 2017-01-07 MED ORDER — ONDANSETRON HCL 4 MG PO TABS
4.0000 mg | ORAL_TABLET | Freq: Four times a day (QID) | ORAL | Status: DC | PRN
  Administered 2017-02-02: 4 mg via ORAL
  Filled 2017-01-07: qty 1

## 2017-01-07 MED ORDER — LEVETIRACETAM 500 MG PO TABS
500.0000 mg | ORAL_TABLET | Freq: Two times a day (BID) | ORAL | Status: DC
  Administered 2017-01-07 – 2017-02-05 (×58): 500 mg via ORAL
  Filled 2017-01-07 (×58): qty 1

## 2017-01-07 MED ORDER — RESOURCE THICKENUP CLEAR PO POWD
ORAL | Status: DC | PRN
  Filled 2017-01-07 (×2): qty 125

## 2017-01-07 MED ORDER — NIMODIPINE 30 MG PO CAPS
60.0000 mg | ORAL_CAPSULE | ORAL | Status: AC
  Administered 2017-01-07 – 2017-01-15 (×33): 60 mg via ORAL
  Filled 2017-01-07 (×35): qty 2

## 2017-01-07 MED ORDER — ACETAMINOPHEN 160 MG/5ML PO SOLN: 650.0000 mg | ORAL | Status: DC | PRN

## 2017-01-07 NOTE — Progress Notes (Addendum)
Physical Therapy Treatment Patient Details Name: Aimee [name] MRN: [id number] DOB: 11/05/1975 Today's Date: 01/07/2017    History of Present Illness Patient is a 41 y/o female presents from home with N/V, confusion. Head CT-diffuse SAH. 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 More confluent acute infarcts in the high frontoparietal regions bilaterally. Intubated 5/22-5/28.    PT Comments    Patient progressing well towards PT goals. Tolerated sitting EOB ~15 mins working on facilitation of core and trunk musculature reaching outside BoS and activating thoracic and lumbar extensors for upright posture. More movement noted in RLE today as well as LUE. Pt did not need wraps on BLEs for EOB activity today and BP stable. HR up to 141 bpm. Pt eager to get to CIR. Encouraged to feed self.  [name] for CIR today. Will follow.  Follow Up Recommendations  CIR;Supervision for mobility/OOB;Supervision/Assistance - 24 hour     Equipment Recommendations  Other (comment) (TBA)    Recommendations for Other Services       Precautions / Restrictions Precautions Precautions: Fall Precaution Comments: quadriplegia; keep MAP 100; PRAFOs Restrictions Weight Bearing Restrictions: No    Mobility  Bed Mobility Overal bed mobility: Needs Assistance Bed Mobility: Rolling Rolling: Max assist;+2 for physical assistance   Supine to sit: Max assist;+2 for physical assistance Sit to supine: Max assist;+2 for physical assistance   General bed mobility comments: Pt able to reach with RUE to rail today with cues; rolled to get off bed pan and for pericare. Assist to bring LLE to EOB and to elevate trunk. Able to go down onto left elbow to return to supine.  Transfers                 General transfer comment: Deferred as pt transferring to rehab  today.  Ambulation/Gait                 Stairs            Wheelchair Mobility    Modified Rankin (Stroke Patients Only) Modified Rankin (Stroke Patients Only) Pre-Morbid Rankin Score: No symptoms Modified Rankin: Severe disability     Balance Overall balance assessment: Needs assistance Sitting-balance support: Feet supported;No upper extremity supported Sitting balance-Leahy Scale: Poor Sitting balance - Comments: Able to sit EOB ~15 mins with minimal cues to maintain upright. Pt with LOB multiple times requiring MIn A to correct but able to reach outside BoS with BUEs and cues. Worked on facilitation of trunk and core. Worked on [employer] through [facility] for feedback.                                    Cognition Arousal/Alertness: Awake/alert Behavior During Therapy: WFL for tasks assessed/performed Overall Cognitive Status: Impaired/Different from baseline Area of Impairment: Attention;Following commands;Awareness;Problem solving                   Current Attention Level: Alternating   Following Commands: Follows multi-step commands consistently;Follows multi-step commands with increased time   Awareness: Emergent Problem Solving: Slow processing;Difficulty sequencing General Comments: Some motor planning issues noted. Delayed processing at times but this seems to be improving.      Exercises General Exercises - Lower Extremity Long Arc Quad: Right;5 reps;Seated Hip Flexion/Marching: Right;5 reps;Seated Other Exercises Other Exercises: shoulder shrugs Other Exercises: Able  to perform toe flexion/extension, DF/PF RLE. Other Exercises: Performed hip abd/add x5 sitting EOB with some assist    General Comments General comments (skin integrity, edema, etc.): Tried transfer to EOB without wrapping BLEs and BP stable, no dizziness.      Pertinent Vitals/Pain Pain Assessment: Faces Faces Pain Scale: No hurt    Home Living                       Prior Function            PT Goals (current goals can now be found in the care [name] section) Progress towards PT goals: Progressing toward goals    Frequency    Min 3X/week      PT [name] Current [name] remains appropriate    Co-evaluation PT/OT/SLP Co-Evaluation/Treatment: Yes Reason for Co-Treatment: Complexity of the patient's impairments (multi-system involvement);For patient/therapist safety PT goals addressed during session: Mobility/safety with mobility;Balance;Strengthening/ROM        AM-PAC PT "6 Clicks" Daily Activity  Outcome Measure  Difficulty turning over in bed (including adjusting bedclothes, sheets and blankets)?: Total Difficulty moving from lying on back to sitting on the side of the bed? : Total Difficulty sitting down on and standing up from a chair with arms (e.g., wheelchair, bedside commode, etc,.)?: Total Help needed moving to and from a bed to chair (including a wheelchair)?: Total Help needed walking in hospital room?: Total Help needed climbing 3-5 steps with a railing? : Total 6 Click Score: 6    End of Session Equipment Utilized During Treatment: Gait belt Activity Tolerance: Patient tolerated treatment well Patient left: with call bell/phone within reach;in bed;Other (comment) (PRAFO boots) Nurse Communication: Mobility status;Need for lift equipment PT Visit Diagnosis: Hemiplegia and hemiparesis Hemiplegia - Right/Left:  (bilateral) Hemiplegia - caused by: Nontraumatic SAH;Cerebral infarction     Time: 1010-1035 PT Time Calculation (min) (ACUTE ONLY): 25 min  Charges:  $Neuromuscular Re-education: 8-22 mins                    G Codes:       Wray Kearns, PT, DPT [phone]     Marguarite Arbour A Leoma Folds 01/07/2017, 11:55 AM

## 2017-01-07 NOTE — Discharge Summary (Signed)
Physician Discharge Summary  Patient ID: Aimee [name] MRN: [id number] DOB/AGE: 1976-03-14 41 y.o.  Admit date: 12/25/2016 Discharge date: 01/07/2017  Admission Diagnoses:  Subarachnoid hemorrhage  Discharge Diagnoses:  Same Active Problems:   SAH (subarachnoid hemorrhage) ([place])   Subarachnoid hemorrhage due to ruptured aneurysm ([facility])   Seizure ([facility])   Stupor   Ventilator dependent ([facility])   Somnolence   Acute encephalopathy   Quadriparesis ([facility])   Class 1 obesity due to excess calories with serious comorbidity and body mass index (BMI) of 30.0 to 30.9 in adult   Benign essential HTN   Diabetes mellitus type 2 in obese ([place])   Leukocytosis   Seizure prophylaxis   Discharged Condition: Stable  Hospital Course:  Aimee [name] is a 41 y.o. female Initially admitted to the hospital with seizure, confusion, and near quadriplegia.  Initial CT scan did demonstrate diffuse subarachnoid hemorrhage, with CT angiogram positive for posterior communicating artery aneurysm.  She underwent uncomplicated endovascular coiling of the aneurysm.  She also underwent MRI scan of the brain and cervical spine in an attempt to determine the etiology of her quadriparesis.  MRI scans were largely unremarkable.  She was monitored in the intensive care unit where she demonstrated slow but progressive improvement in her quadriparesis.  There was no evidence of clinical vasospasm.  She was evaluated by Physical and Occupational therapy and found to be a good candidate for inpatient rehabilitation.  After 14 days, she was discharged in medically stable condition to CIR  Treatments: Surgery -   Endovascular coil embolization of right posterior communicating artery aneurysm  Discharge Instructions: Switch decadron to prednisone dosepak Cont Nimotop for 6 more days  Discharge Exam: Blood pressure 129/76, pulse 73, temperature 98.7 F (37.1 C), temperature source Oral, resp. rate 17, height 5\' 4"   (1.626 m), weight 88.9 kg (196 lb), last menstrual period 12/31/2016, SpO2 98 %. Awake, alert, oriented Speech fluent CN intact 3/5 RUE, 2/5 Left elbow extension, 3/5 grip 3/5 right hip flexion, 2/5 distal Minimal movement LLE  Disposition: CIR  Discharge Instructions    Ambulatory referral to Neurology    Complete by:  As directed    Pt will follow up with Dr. Erlinda Hong at [facility] in about 2 months. Thanks.   Call MD for:  redness, tenderness, or signs of infection (pain, swelling, redness, odor or green/yellow discharge around incision site)    Complete by:  As directed    Call MD for:  temperature >100.4    Complete by:  As directed    Diet - low sodium heart healthy    Complete by:  As directed    May shower / Bathe    Complete by:  As directed    48 hours after surgery   No dressing needed    Complete by:  As directed      Allergies as of 01/07/2017   No Known Allergies     Medication List    TAKE these medications   levETIRAcetam 500 MG tablet Commonly known as:  KEPPRA Take 1 tablet (500 mg total) by mouth 2 (two) times daily.   metFORMIN 500 MG tablet Commonly known as:  GLUCOPHAGE Take 500 mg by mouth daily with breakfast.   NiMODipine 60 MG/20ML Soln Commonly known as:  NYMALIZE Place 20 mLs (60 mg total) into feeding tube every 4 (four) hours.   predniSONE 10 MG (21) Tbpk tablet Commonly known as:  STERAPRED UNI-PAK 21 TAB Taper as directed on package  Follow-up Information    Rosalin Hawking, MD. Schedule an appointment as soon as possible for a visit in 8 week(s).   Specialty:  Neurology Contact information: [address] [place] [address] [phone]        Consuella Lose, MD Follow up in 4 week(s).   Specialty:  Neurosurgery Contact information: [address] [place] [address] [phone]           Signed: Consuella Lose, Loletha Grayer 01/07/2017, 5:04 PM

## 2017-01-07 NOTE — PMR Pre-admission (Signed)
PMR Admission Coordinator Pre-Admission Assessment  Patient: Aimee [name] Plan is an 41 y.o., female MRN: [id number] DOB: 10-07-1975 Height: '5\' 4"'  (162.6 cm) Weight: 88.9 kg (196 lb)              Insurance Information HMO:     PPO:      PCP:      IPA:      80/20:      OTHER:  PRIMARY: pt. Uninsured, self pay and undocumented      Policy#:       Subscriber:  CM Name:       Phone#:      Fax#:  Pre-Cert#:       Employer:  Benefits:  Phone #:      Name:  Eff. 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 Third level apartment with multiple stairs.Presented 12/25/2016 unresponsive with reported decerebrate posturing. Intubated in the ED. CT of the head reviewed, showing SAH. 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 Patient underwent coiling per interventional radiology.Keppra ongoing for seizure prophylaxis. 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  Echocardiogram ejection fraction of 67% grade 1 diastolic dysfunction. Noted quadriparesis with ongoing follow-up per neurosurgery as well as neurology.Patient currently maintained on Decadron protocol. Blood pressure monitored with Nimotop. Nasogastric tube in place for nutritional support and died advanced to mechanical soft nectar thick liquids .Physical and occupational therapy evaluations completed 01/01/2017 with recommendations of physical medicine rehabilitation consult.Patient was admitted for a comprehensive rehabilitation program  NIH Total: 10     Past Medical History  Past Medical History:  Diagnosis Date  . Diabetes mellitus without complication ([place])   . Hypertension     Family History  family history is not on file.  Prior Rehab/Hospitalizations:  Has the patient had major surgery during 100 days prior to admission? No  Current Medications   Current Facility-Administered Medications:  .  0.9 %  sodium chloride infusion, 250 mL, Intravenous, PRN, Dewaine Oats, Katalina M, NP .  0.9 %  sodium chloride infusion, 250 mL, Intravenous, PRN, Newman Pies, MD .  0.9 %  sodium chloride infusion, , Intravenous, Continuous, Raylene Miyamoto, MD, Last Rate: 75 mL/hr at 01/06/17 1945 .  0.9 %  sodium chloride infusion, , Intravenous, Once, Consuella Lose, MD, Stopped at 12/25/16 1915 .  acetaminophen (  TYLENOL) tablet 650 mg, 650 mg, Oral, Q4H PRN **OR** acetaminophen (TYLENOL) solution 650 mg, 650 mg, Per Tube, Q4H PRN, 650 mg at 01/01/17 0425 **OR** acetaminophen (TYLENOL) suppository 650 mg, 650 mg, Rectal, Q4H PRN, Costella, Vincent J, PA-C, 650 mg at 12/27/16 1452 .  chlorhexidine gluconate (MEDLINE KIT) (PERIDEX) 0.12 % solution 15 mL, 15 mL, Mouth Rinse, BID, Consuella Lose, MD, 15 mL at 01/07/17 0904 .  dexamethasone (DECADRON) injection 10 mg, 10 mg, Intravenous, Q6H, Newman Pies, MD, 10 mg at 01/07/17 0500 .  docusate (COLACE) 50 MG/5ML liquid 100 mg, 100 mg,  Per Tube, BID, Newman Pies, MD, Stopped at 01/04/17 [phone] .  insulin aspart (novoLOG) injection 0-20 Units, 0-20 Units, Subcutaneous, Q4H, Mannam, Praveen, MD, 3 Units at 01/07/17 0902 .  labetalol (NORMODYNE,TRANDATE) injection 10-20 mg, 10-20 mg, Intravenous, Q2H PRN, Consuella Lose, MD, 20 mg at 01/01/17 0805 .  levETIRAcetam (KEPPRA) 500 mg in sodium chloride 0.9 % 100 mL IVPB, 500 mg, Intravenous, Q12H, Costella, Vincent J, PA-C, Stopped at 01/07/17 0915 .  nicardipine (CARDENE) 49m in 0.86% saline 2011mIV infusion (0.1 mg/ml), 0-15 mg/hr, Intravenous, Continuous, NuConsuella LoseMD, Stopped at 12/26/16 0920 .  niMODipine (NIMOTOP) capsule 60 mg, 60 mg, Oral, Q4H, 60 mg at 01/07/17 0901 **OR** NiMODipine (NYMALIZE) 60 MG/20ML oral solution 60 mg, 60 mg, Per Tube, Q4H, Costella, Vincent J, PA-C, 60 mg at 01/07/17 0458 .  ondansetron (ZOFRAN-ODT) disintegrating tablet 4 mg, 4 mg, Oral, Q6H PRN **OR** ondansetron (ZOFRAN) injection 4 mg, 4 mg, Intravenous, Q6H PRN, Costella, Vincent J, PA-C, 4 mg at 01/01/17 0552 .  pantoprazole sodium (PROTONIX) 40 mg/20 mL oral suspension 40 mg, 40 mg, Per Tube, Daily, NuConsuella LoseMD, 40 mg at 01/07/17 0901 .  promethazine (PHENERGAN) injection 12.5 mg, 12.5 mg, Intravenous, Q6H PRN, Horton, CoBarbette HairMD, 12.5 mg at 12/25/16 0433 .  RESOURCE THICKENUP CLEAR, , Oral, PRN, ByCollene GobbleMD .  traMADol (UVeatrice Bourbontablet 50-100 mg, 50-100 mg, Oral, Q6H PRN, Costella, Vincent J, PA-C, 100 mg at 01/06/17 1000  Patients Current Diet: DIET DYS 3 Room service appropriate? Yes; Fluid consistency: Nectar Thick  Precautions / Restrictions Precautions Precautions: Fall Precaution Comments: quadriplegia; keep MAP 100; PRAFOs Other Brace/Splint: prafo boots Restrictions Weight Bearing Restrictions: No RLE Weight Bearing: Non weight bearing LLE Weight Bearing: Non weight bearing   Has the patient had 2 or more falls or a fall with injury in the  past year?No  Prior Activity Level Community (5-7x/wk): Pt. 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 None of the above  Prior Functional Level Prior Function Level of Independence: Independent Comments: Works as JaRetail buyer Self Care: Did the patient need help bathing, dressing, using the toilet or eating?  Independent  Indoor Mobility: Did the patient need assistance with walking from room to room (with or without device)? Independent  Stairs: Did the patient need assistance with internal or external stairs (with or without device)? Independent  Functional Cognition: Did the patient need help planning regular tasks such as shopping or remembering to take medications? Independent  Current Functional Level Cognition  Arousal/Alertness: Awake/alert Overall Cognitive Status: Impaired/Different from baseline Difficult to assess due to:  (primary language is Spanish but understands/speaks English) Current Attention Level: Alternating Orientation Level: Oriented X4 Following  Commands: Follows multi-step commands consistently, Follows multi-step commands with increased time General Comments: Some motor planning issues noted. Delayed processing at times but this seems to be improving. Attention: Focused, Sustained Focused Attention: Appears intact Sustained Attention: Impaired Sustained Attention Impairment: Verbal basic Memory: Appears intact Awareness: Impaired Awareness Impairment: Emergent impairment Problem Solving: Impaired Problem Solving Impairment: Verbal basic, Functional basic Executive Function: Initiating, Reasoning Reasoning: Impaired Reasoning Impairment: Verbal basic Initiating: Impaired Initiating Impairment: Verbal basic     Extremity Assessment (includes Sensation/Coordination)  Upper Extremity Assessment: RUE deficits/detail, LUE deficits/detail RUE Deficits / Details: Overflow movement in R hand when attempting L UE movement. Grasp strength 4-/5. 2-/5 bicep and no tricep activation on eval. Unable to complete scapular elevation. Pain sensation in tact with delayed response.  RUE Sensation: decreased proprioception LUE Deficits / Details: Trace (1/5) bicep and tricep activation with increased time. Unable to complete scapular elevation. Pain sensation in tact; proprioception sensation deficits.  LUE Sensation: decreased proprioception  Lower Extremity Assessment: Defer to PT evaluation RLE Deficits / Details: Grossly ~0/5 throughout except 1/5 quads x2; responds to noxious stimulus RLE Sensation:  (WFL) LLE Deficits / Details: Grossly ~0/5 throughout except 1/5 quads x2; responds to noxious stimulus  LLE Sensation:  (WFL)    ADLs  Overall ADL's : Needs assistance/impaired Eating/Feeding: Moderate assistance Eating/Feeding Details (indicate cue type and reason): tech in room (A) with breakfast Grooming: Moderate assistance Grooming Details (indicate cue type and reason): pt able to bring hand to mouth and scratcing face Lower Body Dressing: Total assistance Lower Body Dressing Details (indicate cue type and reason): ace wraps x2 per LE and socks don General ADL Comments: pt hoyer lift to chair at this time. Noted L UE tone present and delayed motor response    Mobility  Overal bed mobility: Needs Assistance Bed Mobility: Rolling Rolling: Max assist, +2 for physical assistance Supine to sit: Max assist, +2 for physical assistance Sit to supine: Max assist, +2 for physical assistance General bed mobility comments: Pt able to reach with RUE to rail today with cues; rolled to get off bed pan and for pericare. Assist to bring LLE to EOB and to elevate trunk. Able to go down onto left elbow to return to supine.     Transfers  Overall transfer level: Needs assistance Transfer via Lift Equipment: [place] transfer comment: Deferred as pt transferring to rehab today.    Ambulation / Gait / Stairs / Office manager / Balance Dynamic Sitting Balance Sitting balance - Comments: Able to sit EOB ~15 mins with minimal cues to maintain upright. Pt with LOB multiple times requiring MIn A to correct but able to reach outside BoS with BUEs and cues. Worked on facilitation of trunk and core. Worked on [employer] through [place] for feedback. Balance Overall balance assessment: Needs assistance Sitting-balance support: Feet supported, No upper extremity supported Sitting balance-Leahy Scale: Poor Sitting balance - Comments: Able to sit EOB ~15 mins with minimal cues to maintain upright. Pt with LOB multiple times requiring MIn A to correct but able to reach outside BoS with BUEs and cues. Worked on facilitation of trunk and core. Worked on [employer] through [place] for feedback. Postural control: Posterior lean    Special needs/care consideration BiPAP/CPAP    no CPM   no Continuous Drip IV   no Dialysis   no        Life Vest   no Oxygen   no Special Bed  no Trach Size   n/a Wound Vac (area)   no      Skin   MASD bilateral buttocks and perineum , barrier cream treatment following cleansing             Bowel mgmt:   Incontinent, last BM 01/06/17 Bladder mgmt: incontinent Diabetic mgmt novolog insulin     Previous Home Environment Living Arrangements: Other relatives (brother and sister)  Lives With: Family Available Help at Discharge: Family, Available 24 hours/day Type of Home: Apartment Home Layout: One level Home Access: Stairs to enter [employer] of Steps: 3rd level apt with 3 flights of stairs Biochemist, clinical: [place]: No  Discharge Living Setting Plans for Discharge Living Setting: Lives with (comment) (will DC to sister in [place] one level home) Type of  Home at Discharge: House Discharge Home Layout: One level Discharge Home Access: Stairs to enter Entrance Stairs-Number of Steps: 1 Discharge Bathroom Shower/Tub: Tub/shower unit Discharge Bathroom Toilet: Standard Discharge Bathroom Accessibility: Yes How Accessible: Accessible via walker, Other (comment) (brother thinks may be WC accessible) Does the patient have any problems obtaining your medications?: No  Social/Family/Support Systems Patient Roles: Other (Comment) (pt. lived with brother and sister in law PTA) Contact Information: Derenda Mis, brother, [phone] Anticipated Caregiver: Primary caregivers will be pt's 2 sisters in law, Jearld Pies and Malena Edman during the daytime with assistance from brother Derenda Mis when he gets off work at [employer] Ability/Limitations of Caregiver: [name] states pt's 2 sisters in law have no limitations.  They have alternating schedules so one will always be available to care for pt. Caregiver Availability: 24/7 Discharge Plan Discussed with Primary Caregiver: Yes Is Caregiver In Agreement with Plan?: Yes Does Caregiver/Family have Issues with Lodging/Transportation while Pt is in Rehab?: No   I have discussed at length with pt. and brother [name] that pt. is expected to DC at [facility] level and needs accessible home to enter.  [name] states that pt., [name] and his wife will move in with sister in law who has a one level home with one small step to enter.  Pt. and [name] understand and agree with  the plan for pt. To DC home at the end of CIR stay with 24 hour care given by family (max and total assist likely). [name] 2 sisters in law will be primary caregivers during the day while [name] will assist in the evenings.  They understand that pt. May not have follow up therapies due to no payor source following rehab stay.  They wish to proceed with IP Rehab admission.   Goals/Additional Needs Patient/Family Goal for Rehab: max and total assist PT/OT; supervision  and min assist SLP Expected length of stay: 25-30 days Cultural Considerations: Pt. and brother's primary language is Spanish however they both speak and understand English and have denied need for interpretive services Dietary Needs: dysphagia 3 diet with nectar thick liquids Equipment Needs: TBA Pt/Family Agrees to Admission and willing to participate: Yes Program Orientation Provided & Reviewed with Pt/Caregiver Including Roles  & Responsibilities: Yes   Decrease burden of Care through IP rehab admission: n/a  Plan is for admission to IP Rehab then discharge home with family   Possible need for SNF placement upon discharge:  Not expected   Patient Condition: This patient's medical and functional status has changed since the consult dated: 01/02/17  in which the Rehabilitation Physician determined and documented that the patient's condition is appropriate for intensive rehabilitative care in an inpatient  rehabilitation facility. See "History of Present Illness" (above) for medical update. Functional changes are: +2 max assist bed mobility, zero sitting balance at EOB; mod assist for eating/feeding/grooming; dysphagia 3 diet with nectar thick liquids with ongoing  . Patient's medical and functional status update has been discussed with the Rehabilitation physician and patient remains appropriate for inpatient rehabilitation. Will admit to inpatient rehab today.  Preadmission Screen Completed By:  Gerlean Ren, 01/07/2017 1:23 PM ______________________________________________________________  Discussed status with Dr. Posey Pronto on 01/07/17 at  1316  and received telephone approval for admission today.  Admission Coordinator:  Gerlean Ren, time [address] 01/07/17

## 2017-01-07 NOTE — Progress Notes (Signed)
Gerlean Ren [facility] Admission Coordinator Signed Physical Medicine and Rehabilitation  PMR Pre-admission Date of Service: 01/07/2017 12:58 PM  Related encounter: ED to Hosp-Admission (Current) from 12/25/2016 in [place]       []Hide copied text PMR Admission Coordinator Pre-Admission Assessment  Patient: Aimee [name] is an 41 y.o., female MRN: [id number] DOB: [birth year] Height: 5' 4" (162.6 cm) Weight: 88.9 kg (196 lb)                                                                                                                                                  Insurance Information HMO:     PPO:      PCP:      IPA:      80/20:      OTHER:  PRIMARY: pt. Uninsured, self pay and undocumented      Policy#:       Subscriber:  CM Name:       Phone#:      Fax#:  Pre-Cert#:       Employer:  Benefits:  Phone #:      Name:  Eff. 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 Third level apartment with multiple stairs.Presented 12/25/2016 unresponsive with reported decerebrate posturing. Intubated in the ED. CT of the head reviewed, showing SAH.  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 Echocardiogram ejection fraction of 73% grade 1 diastolic dysfunction. Noted quadriparesis with ongoing follow-up per neurosurgery as well as neurology.Patient currently maintained on Decadron protocol. Blood pressure  monitored with Nimotop. Nasogastric tube in place for nutritional supportand died advanced to mechanical soft nectar thick liquids .Physical and occupational therapy evaluations completed 01/01/2017 with recommendations of physical medicine rehabilitation consult.Patient was admitted for a comprehensive rehabilitation program  NIH Total: 10   Past Medical History      Past Medical History:  Diagnosis Date  . Diabetes mellitus without complication ([place])   . Hypertension     Family History  family history is not on file.  Prior Rehab/Hospitalizations:  Has the patient had major surgery during 100 days prior to admission? No  Current Medications   Current Facility-Administered Medications:  .  0.9 %  sodium chloride infusion, 250 mL, Intravenous, PRN, Dewaine Oats, Katalina M, NP .  0.9 %  sodium chloride infusion, 250 mL, Intravenous, PRN, Newman Pies, MD .  0.9 %  sodium chloride infusion, , Intravenous, Continuous, Raylene Miyamoto, MD, Last Rate: 75 mL/hr at 01/06/17 1945 .  0.9 %  sodium chloride infusion, , Intravenous, Once, Consuella Lose, MD, Stopped at 12/25/16 1915 .  acetaminophen (TYLENOL) tablet 650 mg, 650 mg, Oral, Q4H PRN **OR** acetaminophen (TYLENOL) solution 650 mg, 650 mg, Per Tube, Q4H PRN, 650 mg at 01/01/17 0425  **OR** acetaminophen (TYLENOL) suppository 650 mg, 650 mg, Rectal, Q4H PRN, Costella, Vincent J, PA-C, 650 mg at 12/27/16 1452 .  chlorhexidine gluconate (MEDLINE KIT) (PERIDEX) 0.12 % solution 15 mL, 15 mL, Mouth Rinse, BID, Consuella Lose, MD, 15 mL at 01/07/17 0904 .  dexamethasone (DECADRON) injection 10 mg, 10 mg, Intravenous, Q6H, Newman Pies, MD, 10 mg at 01/07/17 0500 .  docusate (COLACE) 50 MG/5ML liquid 100 mg, 100 mg, Per Tube, BID, Newman Pies, MD, Stopped at 01/04/17 [phone] .  insulin aspart (novoLOG) injection 0-20 Units, 0-20 Units, Subcutaneous, Q4H, Mannam, Praveen, MD, 3 Units at 01/07/17 0902 .  labetalol (NORMODYNE,TRANDATE) injection 10-20 mg, 10-20 mg, Intravenous, Q2H PRN, Consuella Lose, MD, 20 mg at 01/01/17 0805 .  levETIRAcetam (KEPPRA) 500 mg in sodium chloride 0.9 % 100 mL IVPB, 500 mg, Intravenous, Q12H, Costella, Vincent J, PA-C, Stopped at 01/07/17 0915 .  nicardipine (CARDENE) 99m in 0.86% saline 2053mIV infusion (0.1 mg/ml), 0-15 mg/hr, Intravenous, Continuous, NuConsuella LoseMD, Stopped at 12/26/16 0920 .  niMODipine (NIMOTOP) capsule 60 mg, 60 mg, Oral, Q4H, 60 mg at 01/07/17 0901 **OR** NiMODipine (NYMALIZE) 60 MG/20ML oral solution 60 mg, 60 mg, Per Tube, Q4H, Costella, Vincent J, PA-C, 60 mg at 01/07/17 0458 .  ondansetron (ZOFRAN-ODT) disintegrating tablet 4 mg, 4 mg, Oral, Q6H PRN **OR** ondansetron (ZOFRAN) injection 4 mg, 4 mg, Intravenous, Q6H PRN, Costella, Vincent J, PA-C, 4 mg at 01/01/17 0552 .  pantoprazole sodium (PROTONIX) 40 mg/20 mL oral suspension 40 mg, 40 mg, Per Tube, Daily, NuConsuella LoseMD, 40 mg at 01/07/17 0901 .  promethazine (PHENERGAN) injection 12.5 mg, 12.5 mg, Intravenous, Q6H PRN, Horton, CoBarbette HairMD, 12.5 mg at 12/25/16 0433 .  RESOURCE THICKENUP CLEAR, , Oral, PRN, ByCollene GobbleMD .  traMADol (UVeatrice Bourbontablet 50-100 mg, 50-100 mg, Oral, Q6H PRN, Costella, Vincent J, PA-C, 100 mg at 01/06/17  1000  Patients Current Diet: DIET DYS 3 Room service appropriate? Yes; Fluid consistency: Nectar Thick  Precautions / Restrictions Precautions Precautions: Fall Precaution Comments: quadriplegia; keep MAP 100; PRAFOs Other Brace/Splint: prafo boots Restrictions Weight Bearing Restrictions: No RLE Weight Bearing: Non weight bearing LLE Weight Bearing: Non weight bearing   Has the patient had 2 or more falls or a fall  with injury in the past year?No  Prior Activity Level Community (5-7x/wk): Pt. 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 None of the above  Prior Functional Level Prior Function Level of Independence: Independent Comments: Works as Retail buyer.  Self Care: Did the patient need help bathing, dressing, using the toilet or eating?  Independent  Indoor Mobility: Did the patient need assistance with walking from room to room (with or without device)? Independent  Stairs: Did the patient need assistance with internal or external stairs (with or without device)? Independent  Functional Cognition: Did the patient need help planning regular tasks such as shopping or remembering to take medications? Independent  Current Functional Level Cognition  Arousal/Alertness: Awake/alert Overall Cognitive Status: Impaired/Different from baseline Difficult to assess due to:  (primary language is Spanish but understands/speaks English) Current Attention Level: Alternating Orientation Level: Oriented X4 Following Commands: Follows multi-step commands consistently, Follows multi-step commands with increased time General Comments: Some motor planning issues noted. Delayed processing at times but this seems to be improving. Attention: Focused,  Sustained Focused Attention: Appears intact Sustained Attention: Impaired Sustained Attention Impairment: Verbal basic Memory: Appears intact Awareness: Impaired Awareness Impairment: Emergent impairment Problem Solving: Impaired Problem Solving Impairment: Verbal basic, Functional basic Executive Function: Initiating, Reasoning Reasoning: Impaired Reasoning Impairment: Verbal basic Initiating: Impaired Initiating Impairment: Verbal basic    Extremity Assessment (includes Sensation/Coordination)  Upper Extremity Assessment: RUE deficits/detail, LUE deficits/detail RUE Deficits / Details: Overflow movement in R hand when attempting L UE movement. Grasp strength 4-/5. 2-/5 bicep and no tricep activation on eval. Unable to complete scapular elevation. Pain sensation in tact with delayed response.  RUE Sensation: decreased proprioception LUE Deficits / Details: Trace (1/5) bicep and tricep activation with increased time. Unable to complete scapular elevation. Pain sensation in tact; proprioception sensation deficits.  LUE Sensation: decreased proprioception  Lower Extremity Assessment: Defer to PT evaluation RLE Deficits / Details: Grossly ~0/5 throughout except 1/5 quads x2; responds to noxious stimulus RLE Sensation:  (WFL) LLE Deficits / Details: Grossly ~0/5 throughout except 1/5 quads x2; responds to noxious stimulus  LLE Sensation:  (WFL)    ADLs  Overall ADL's : Needs assistance/impaired Eating/Feeding: Moderate assistance Eating/Feeding Details (indicate cue type and reason): tech in room (A) with breakfast Grooming: Moderate assistance Grooming Details (indicate cue type and reason): pt able to bring hand to mouth and scratcing face Lower Body Dressing: Total assistance Lower Body Dressing Details (indicate cue type and reason): ace wraps x2 per LE and socks don General ADL Comments: pt hoyer lift to chair at this time. Noted L UE tone present and delayed motor response      Mobility  Overal bed mobility: Needs Assistance Bed Mobility: Rolling Rolling: Max assist, +2 for physical assistance Supine to sit: Max assist, +2 for physical assistance Sit to supine: Max assist, +2 for physical assistance General bed mobility comments: Pt able to reach with RUE to rail today with cues; rolled to get off bed pan and for pericare. Assist to bring LLE to EOB and to elevate trunk. Able to go down onto left elbow to return to supine.    Transfers  Overall transfer level: Needs assistance Transfer via Lift Equipment: [place] transfer comment: Deferred as pt transferring to rehab today.    Ambulation / Gait /  Stairs / Office manager / Balance Dynamic Sitting Balance Sitting balance - Comments: Able to sit EOB ~15 mins with minimal cues to maintain upright. Pt with LOB multiple times requiring MIn A to correct but able to reach outside BoS with BUEs and cues. Worked on facilitation of trunk and core. Worked on [employer] through [place] for feedback. Balance Overall balance assessment: Needs assistance Sitting-balance support: Feet supported, No upper extremity supported Sitting balance-Leahy Scale: Poor Sitting balance - Comments: Able to sit EOB ~15 mins with minimal cues to maintain upright. Pt with LOB multiple times requiring MIn A to correct but able to reach outside BoS with BUEs and cues. Worked on facilitation of trunk and core. Worked on [employer] through [place] for feedback. Postural control: Posterior lean    Special needs/care consideration BiPAP/CPAP    no CPM   no Continuous Drip IV   no Dialysis   no        Life Vest   no Oxygen   no Special Bed    no Trach Size   n/a Wound Vac (area)   no      Skin   MASD bilateral buttocks and perineum , barrier cream treatment following cleansing             Bowel mgmt:   Incontinent, last BM 01/06/17 Bladder mgmt: incontinent Diabetic mgmt novolog insulin     Previous Home  Environment Living Arrangements: Other relatives (brother and sister)  Lives With: Family Available Help at Discharge: Family, Available 24 hours/day Type of Home: Apartment Home Layout: One level Home Access: Stairs to enter [employer] of Steps: 3rd level apt with 3 flights of stairs Biochemist, clinical: [place]: No  Discharge Living Setting Plans for Discharge Living Setting: Lives with (comment) (will DC to sister in [place] one level home) Type of Home at Discharge: House Discharge Home Layout: One level Discharge Home Access: Stairs to enter Entrance Stairs-Number of Steps: 1 Discharge Bathroom Shower/Tub: Tub/shower unit Discharge Bathroom Toilet: Standard Discharge Bathroom Accessibility: Yes How Accessible: Accessible via walker, Other (comment) (brother thinks may be WC accessible) Does the patient have any problems obtaining your medications?: No  Social/Family/Support Systems Patient Roles: Other (Comment) (pt. lived with brother and sister in law PTA) Contact Information: Derenda Mis, brother, [phone] Anticipated Caregiver: Primary caregivers will be pt's 2 sisters in law, Jearld Pies and Malena Edman during the daytime with assistance from brother Derenda Mis when he gets off work at [employer] Ability/Limitations of Caregiver: [name] states pt's 2 sisters in law have no limitations.  They have alternating schedules so one will always be available to care for pt. Caregiver Availability: 24/7 Discharge [name] Discussed with Primary Caregiver: Yes Is Caregiver In Agreement with [name]?: Yes Does Caregiver/Family have Issues with Lodging/Transportation while Pt is in Rehab?: No   I have discussed at length with pt. and brother [name] that pt. is expected to DC at [facility] level and needs accessible home to enter.  [name] states that pt., [name] and his wife will move in with sister in law who has a one level home with one small step to enter.  Pt. and [name]  understand and agree with  the [name] for pt. To DC home at the end of CIR stay with 24 hour care given by family (max and total assist likely). [name] 2 sisters in law will be primary caregivers during the day while [name] will assist in the evenings.  They understand that pt. May not have follow up therapies due to no payor source following rehab stay.  They wish to proceed with IP Rehab admission.   Goals/Additional Needs Patient/Family Goal for Rehab: max and total assist PT/OT; supervision and min assist SLP Expected length of stay: 25-30 days Cultural Considerations: Pt. and brother's primary language is Spanish however they both speak and understand English and have denied need for interpretive services Dietary Needs: dysphagia 3 diet with nectar thick liquids Equipment Needs: TBA Pt/Family Agrees to Admission and willing to participate: Yes Program Orientation Provided & Reviewed with Pt/Caregiver Including Roles  & Responsibilities: Yes   Decrease burden of Care through IP rehab admission: n/a  [name] is for admission to IP Rehab then discharge home with family   Possible need for SNF placement upon discharge:  Not expected   Patient Condition: This patient's medical and functional status has changed since the consult dated: 01/02/17  in which the Rehabilitation Physician determined and documented that the patient's condition is appropriate for intensive rehabilitative care in an inpatient rehabilitation facility. See "History of Present Illness" (above) for medical update. Functional changes are: +2 max assist bed mobility, zero sitting balance at EOB; mod assist for eating/feeding/grooming; dysphagia 3 diet with nectar thick liquids with ongoing  . Patient's medical and functional status update has been discussed with the Rehabilitation physician and patient remains appropriate for inpatient rehabilitation. Will admit to inpatient rehab today.  Preadmission Screen Completed By:  Gerlean Ren, 01/07/2017 1:23 PM ______________________________________________________________  Discussed status with Dr. Posey Pronto on 01/07/17 at  1316  and received telephone approval for admission today.  Admission Coordinator:  Gerlean Ren, time [id number] 01/07/17       Cosigned by: Jamse Arn, MD at 01/07/2017 1:28 PM  Revision History

## 2017-01-07 NOTE — Progress Notes (Signed)
Transcranial Doppler  Date POD PCO2 HCT BP  MCA ACA PCA OPHT SIPH VERT Basilar  5/23 rs     Right  Left   76  44   -52  -48   42  31   27  41   29  48   *  -37   -35      5/25 je     Right  Left   103  118   -95  -94   58  49   37  48   51  72   -35  *   -34      5/28 je     Right  Left   101  113   -53  -68   34  -36   33  42   56  72   -42  *   *       5/30 VS     Right  Left   56  21   -32  *   30  *   41  41   -42  45   *  *   *  *     6/1 VS     Right  Left   53  41   -33  *   50  *   27  45   38  47   *  *   *  *    6/4 JE     Right  Left   61  53   -39  -41   33  25   24  22    48  58   -25  -26   -42           Right  Left                                        MCA = Middle Cerebral Artery      OPHT = Opthalmic Artery     BASILAR = Basilar Artery   ACA = Anterior Cerebral Artery     SIPH = Carotid Siphon PCA = Posterior Cerebral Artery   VERT = Verterbral Artery                   Normal MCA = 62+\-12 ACA = 50+\-12 PCA = 42+\-23   12/26/16 - * not insonated rds 5/25 [place] ratio: right = 2.8 left = 3.2 JE 5/28 Unable to complete study (left vert and basilar) due to increased movement. Patient was recently extubated. [place] ratio: right = 2.5 left = 2.9 JE 01/02/17 [place] Right 1.36 Left not obtained due to values not obtained Technically difficult due to constant movement of the head and eyes.Exam may be inconclusive today. 01/04/17 Patient was much more cooperative today however there was still quite a bit of movement. I was unable to complete a recalculation of the left ACA and PCA Equipment would not save. The ACA peak was 74.0 and diastolic was 81.4 The PCA peak was 48.1 and diastolic was 85.6 Was still unable to insonate the verebral or basilic  Or ICAs due to movement.

## 2017-01-07 NOTE — Significant Event (Signed)
Patient discharge safely to CIR, 4W12-taken via bed. All personal belongings (clothes, balloons, strengthening supplies) are all in patient's new room. Family at bedside. Report given to receiving RN Neoma Laming.    Avant Printy

## 2017-01-07 NOTE — Progress Notes (Signed)
Admit to unit via bed,, oriented to unit, revieweed medications, schedule and rehab routine with patient and family. No questions noted. Aimee [name]

## 2017-01-07 NOTE — Progress Notes (Signed)
Occupational Therapy Treatment Patient Details Name: Aimee [name] MRN: [id number] DOB: Dec 23, 1975 Today's Date: 01/07/2017    History of present illness Patient is a 41 y/o female presents from home with N/V, confusion. Head CT-diffuse SAH. 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 More confluent acute infarcts in the high frontoparietal regions bilaterally. Intubated 5/22-5/28.   OT comments  Pt progressing toward OT goals. She was able to complete grooming tasks seated at EOB this session with min assist for balance and for assist at times with reaching for washcloth. Pt continues to require increased processing time during ADL but this is improved since previous session. She was able to tolerate sitting at EOB for approximately 15 minutes during ADL tasks demonstrating improved core stability and B UE movement. Provided education concerning use of built-up handles, plate guard, and mug with handle to improve independence with self-feeding and provided AE. Pt and RN report understanding and educated pt on benefits of participating in self-feeding tasks. She remains eager to D/C to CIR. D/C [name] remains appropriate. BP stable and HR up to 141 with activity. OT will continue to follow while admitted.    Follow Up Recommendations  CIR;Supervision/Assistance - 24 hour    Equipment Recommendations  Other (comment) (TBD at next venue of care)    Recommendations for Other Services Rehab consult    Precautions / Restrictions Precautions Precautions: Fall Precaution Comments: quadriplegia; keep MAP 100; PRAFOs Required Braces or Orthoses: Other Brace/Splint Other Brace/Splint: prafo boots Restrictions Weight Bearing Restrictions: No       Mobility Bed Mobility Overal bed mobility: Needs Assistance Bed Mobility: Rolling Rolling: Max assist;+2  for physical assistance   Supine to sit: Max assist;+2 for physical assistance Sit to supine: +2 for physical assistance;Mod assist   General bed mobility comments: Pt able to reach with RUE to rail today with cues; rolled to get off bed pan and for pericare. Assist to bring LLE to EOB and to elevate trunk. Able to go down onto left elbow to return to supine.  Transfers                 General transfer comment: Deferred as pt transferring to rehab today.    Balance Overall balance assessment: Needs assistance Sitting-balance support: Feet supported;No upper extremity supported Sitting balance-Leahy Scale: Poor Sitting balance - Comments: Able to sit EOB ~15 mins with minimal cues to maintain upright. Pt with LOB multiple times requiring MIn A to correct but able to reach outside BoS with BUEs and cues. Worked on facilitation of trunk and core. Worked on [employer] through [place] for feedback. Postural control: Posterior lean;Left lateral lean                                 ADL either performed or assessed with clinical judgement   ADL Overall ADL's : Needs assistance/impaired Eating/Feeding: Supervision/ safety;Sitting;Set up Eating/Feeding Details (indicate cue type and reason): Educated pt and RN concerning use of plate guard, built-up handles for utensils, and mug with handle to improve independence with self-feeding.  Grooming: Minimal assistance;Wash/dry face;Sitting                                 General ADL Comments: Session focused on ADL participation seated at  EOB. Facilitated improved postural control as well as L UE weight bearing and AAROM.     Vision   Additional Comments: Able to make eye-contact and functionally utilize vision this session.    Perception     Praxis      Cognition Arousal/Alertness: Awake/alert Behavior During Therapy: WFL for tasks assessed/performed Overall Cognitive Status: Impaired/Different from baseline Area of  Impairment: Attention;Following commands;Awareness;Problem solving                   Current Attention Level: Alternating   Following Commands: Follows multi-step commands consistently;Follows multi-step commands with increased time   Awareness: Emergent Problem Solving: Slow processing;Difficulty sequencing General Comments: Improvements noted in processing time but pt continues to require increased time to follow commands.         Exercises Exercises: Other exercises General Exercises - Upper Extremity Shoulder Flexion: AAROM;Left;5 reps;Seated Other Exercises Other Exercises: Scapular elevation x5 Other Exercises: Facilitated improved L UE weightbearing to provide feedback with reaching tasks with R UE. Other Exercises: shoulder flexion AAROM L UE with tone present   Shoulder Instructions       General Comments      Pertinent Vitals/ Pain       Pain Assessment: Faces Faces Pain Scale: No hurt  Home Living                                          Prior Functioning/Environment              Frequency  Min 3X/week        Progress Toward Goals  OT Goals(current goals can now be found in the care [name] section)  Progress towards OT goals: Progressing toward goals;Goals met and updated - see care [name] (met 2/6 and updated these)  Acute Rehab OT Goals Patient Stated Goal: to walk in 4 weeks OT Goal Formulation: With patient Time For Goal Achievement: 01/15/17 Potential to Achieve Goals: Good  [name] Discharge [name] remains appropriate    Co-evaluation    PT/OT/SLP Co-Evaluation/Treatment: Yes Reason for Co-Treatment: Complexity of the patient's impairments (multi-system involvement);For patient/therapist safety   OT goals addressed during session: ADL's and self-care;Strengthening/ROM      AM-PAC PT "6 Clicks" Daily Activity     Outcome Measure   Help from another person eating meals?: A Little Help from another person taking  care of personal grooming?: A Little Help from another person toileting, which includes using toliet, bedpan, or urinal?: Total Help from another person bathing (including washing, rinsing, drying)?: Total Help from another person to put on and taking off regular upper body clothing?: Total Help from another person to put on and taking off regular lower body clothing?: Total 6 Click Score: 10    End of Session    OT Visit Diagnosis: Cognitive communication deficit (R41.841);Muscle weakness (generalized) (M62.81);Other symptoms and signs involving the nervous system (R29.898) Symptoms and signs involving cognitive functions: Cerebral infarction   Activity Tolerance Patient tolerated treatment well   Patient Left in bed;with call bell/phone within reach   Nurse Communication Mobility status;Other (comment) (AE education)        Time: 1010-1036 OT Time Calculation (min): 26 min  Charges: OT General Charges $OT Visit: 1 Procedure OT Treatments $Self Care/Home Management : 8-22 mins  Norman Herrlich, MS OTR/L  Pager: [place] 01/07/2017, 5:27 PM

## 2017-01-07 NOTE — H&P (Signed)
Physical Medicine and Rehabilitation Admission H&P    Chief Complaint  Patient presents with  . Altered Mental Status  : HPI: Aimee [name] is a 41 y.o. Hispanic right handed female with history of reported obesity, hypertension, diabetes mellitus. Per chart review, patient lives with brother and sister. 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 Third level apartment with multiple stairs. Presented 12/25/2016 unresponsive with reported decerebrate posturing. Intubated in the ED. CT of the head reviewed, showing SAH.  Per report, diffuse subarachnoid hemorrhage filling CSF spaces in the suprasellar and basal cisterns as well as bilateral sulci. 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 Patient underwent coiling per interventional radiology.Keppra ongoing for seizure prophylaxis. 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 Echocardiogram ejection fraction of 18% grade 1 diastolic dysfunction. Noted quadriparesis with ongoing follow-up per neurosurgery as well as neurology. Patient currently maintained on Decadron protocol. Blood pressure monitored with Nimotop. Nasogastric tube in place for nutritional support and died advanced to mechanical soft nectar thick liquids . Physical and occupational therapy evaluations completed 01/01/2017 with recommendations of physical medicine rehabilitation consult.Patient was admitted for a comprehensive rehabilitation program  Review of Systems  Constitutional: Negative for chills and fever.  Eyes: Positive for blurred vision.  Respiratory: Negative for shortness of breath.   Cardiovascular: Negative for chest pain and palpitations.  Gastrointestinal: Positive for constipation. Negative for nausea and vomiting.  Genitourinary: Negative for flank pain, hematuria and urgency.  Musculoskeletal: Positive for back pain and  myalgias.  Skin: Negative for rash.  Neurological: Positive for dizziness, speech change and focal weakness. Negative for sensory change and seizures.  All other systems reviewed and are negative.  Past Medical History:  Diagnosis Date  . Diabetes mellitus without complication ([place])   . Hypertension    Past Surgical History:  Procedure Laterality Date  . IR ANGIO INTRA EXTRACRAN SEL INTERNAL CAROTID BILAT MOD SED  12/25/2016  . IR ANGIO VERTEBRAL SEL VERTEBRAL UNI L MOD SED  12/25/2016  . IR ANGIOGRAM FOLLOW UP STUDY  12/25/2016  . IR ANGIOGRAM FOLLOW UP STUDY  12/25/2016  . IR ANGIOGRAM FOLLOW UP STUDY  12/25/2016  . IR ANGIOGRAM FOLLOW UP STUDY  12/25/2016  . IR TRANSCATH/EMBOLIZ  12/25/2016  . RADIOLOGY WITH ANESTHESIA N/A 12/25/2016   Procedure: RADIOLOGY WITH ANESTHESIA;  Surgeon: Consuella Lose, MD;  Location: [facility];  Service: Radiology;  Laterality: N/A;   History reviewed. No pertinent family history of premature CVA. Social History:  reports that she has never smoked. She has never used smokeless tobacco. She reports that she does not drink alcohol. Her drug history is not on file. Allergies: No Known Allergies Medications Prior to Admission  Medication Sig Dispense Refill  . metFORMIN (GLUCOPHAGE) 500 MG tablet Take 500 mg by mouth daily with breakfast.      Home: Home Living Family/patient expects to be discharged to:: Inpatient rehab Living Arrangements: Other relatives (brother and sister) Available Help at Discharge: Family, Available PRN/intermittently Type of Home: Apartment Home Access: Stairs to enter [employer] of Steps: 3rd level apt with 3 flights of stairs Home Layout: One level Biochemist, clinical: Standard Home Equipment: None  Lives With: Family   Functional History: Prior Function Level of Independence: Independent Comments: Works as Retail buyer.  Functional Status:  Mobility: Bed Mobility Overal bed mobility: Needs Assistance Bed  Mobility: Rolling Rolling: Max assist, +  2 for physical assistance Supine to sit: Total assist, +2 for physical assistance, HOB elevated Sit to supine: Total assist, +2 for physical assistance General bed mobility comments: Rolling to right/left with assist of 2 to place lift pad.  Transfers Overall transfer level: Needs assistance Transfer via Lift Equipment: [place] transfer comment: used sky lift to get pt up in chair.      ADL: ADL Overall ADL's : Needs assistance/impaired Eating/Feeding: Moderate assistance Eating/Feeding Details (indicate cue type and reason): tech in room (A) with breakfast Grooming: Moderate assistance Grooming Details (indicate cue type and reason): pt able to bring hand to mouth and scratcing face Lower Body Dressing: Total assistance Lower Body Dressing Details (indicate cue type and reason): ace wraps x2 per LE and socks don General ADL Comments: pt hoyer lift to chair at this time. Noted L UE tone present and delayed motor response  Cognition: Cognition Overall Cognitive Status: Impaired/Different from baseline Arousal/Alertness: Awake/alert Orientation Level: (P) Oriented X4 Attention: Focused, Sustained Focused Attention: Appears intact Sustained Attention: Impaired Sustained Attention Impairment: Verbal basic Memory: Appears intact Awareness: Impaired Awareness Impairment: Emergent impairment Problem Solving: Impaired Problem Solving Impairment: Verbal basic, Functional basic Executive Function: Initiating, Reasoning Reasoning: Impaired Reasoning Impairment: Verbal basic Initiating: Impaired Initiating Impairment: Verbal basic Cognition Arousal/Alertness: Awake/alert Behavior During Therapy: WFL for tasks assessed/performed Overall Cognitive Status: Impaired/Different from baseline Area of Impairment: Attention, Following commands, Awareness, Problem solving Current Attention Level: Selective Following Commands: Follows one step  commands with increased time Awareness: Emergent Problem Solving: Slow processing, Difficulty sequencing General Comments: Pt seems to have some motor planning issues. Conversant today appropriately. Delayed processing at times.  Difficult to assess due to: Non-English speaking (does understand english and respond in [place])  Physical Exam: Blood pressure (!) 148/91, pulse 74, temperature 98.8 F (37.1 C), temperature source Oral, resp. rate 13, height 5\' 4"  (1.626 m), weight 88.9 kg (196 lb), last menstrual period 12/31/2016, SpO2 98 %. Physical Exam  Constitutional: She appears well-developed.  41 year old right-handed obese female  HENT:  Head: Normocephalic and atraumatic.  Eyes: EOM are normal. Right eye exhibits no discharge. Left eye exhibits no discharge.  Neck: Normal range of motion. Neck supple. No thyromegaly present.  Cardiovascular: Normal rate, regular rhythm and normal heart sounds.   Respiratory: Effort normal and breath sounds normal. No respiratory distress.  GI: Soft. Bowel sounds are normal. She exhibits no distension.  Musculoskeletal: She exhibits no edema or tenderness.  Neurological:  Patient is alert and oriented.  Makes eye contact with examiner.  Motor: RUE: 4+/5 proximal to distal LUE: Shoulder abduction 2+/5, elbow flex/ext 2-/5, hand grip 4-/5 RLE: 2/5 right hip flexion, KE, 2/5 ADF/PF LLE: 0/5  Skin: Skin is warm and dry.  Psychiatric: She has a normal mood and affect. Her behavior is normal.   , Results for orders placed or performed during the hospital encounter of 12/25/16 (from the past 48 hour(s))  Glucose, capillary     Status: Abnormal   Collection Time: 01/05/17 12:39 PM  Result Value Ref Range   Glucose-Capillary 156 (H) 65 - 99 mg/dL  Glucose, capillary     Status: Abnormal   Collection Time: 01/05/17  4:47 PM  Result Value Ref Range   Glucose-Capillary 152 (H) 65 - 99 mg/dL  Glucose, capillary     Status: Abnormal   Collection Time:  01/05/17  7:56 PM  Result Value Ref Range   Glucose-Capillary 134 (H) 65 - 99 mg/dL   Comment 1 Notify  RN    Comment 2 Document in Chart   Glucose, capillary     Status: Abnormal   Collection Time: 01/05/17 11:32 PM  Result Value Ref Range   Glucose-Capillary 180 (H) 65 - 99 mg/dL   Comment 1 Notify RN    Comment 2 Document in Chart   Glucose, capillary     Status: Abnormal   Collection Time: 01/06/17  4:24 AM  Result Value Ref Range   Glucose-Capillary 131 (H) 65 - 99 mg/dL   Comment 1 Notify RN    Comment 2 Document in Chart   Glucose, capillary     Status: Abnormal   Collection Time: 01/06/17  8:40 AM  Result Value Ref Range   Glucose-Capillary 134 (H) 65 - 99 mg/dL  Glucose, capillary     Status: Abnormal   Collection Time: 01/06/17 11:48 AM  Result Value Ref Range   Glucose-Capillary 114 (H) 65 - 99 mg/dL  Glucose, capillary     Status: Abnormal   Collection Time: 01/06/17  4:21 PM  Result Value Ref Range   Glucose-Capillary 199 (H) 65 - 99 mg/dL  Glucose, capillary     Status: Abnormal   Collection Time: 01/06/17  7:31 PM  Result Value Ref Range   Glucose-Capillary 194 (H) 65 - 99 mg/dL  Glucose, capillary     Status: Abnormal   Collection Time: 01/06/17 11:03 PM  Result Value Ref Range   Glucose-Capillary 135 (H) 65 - 99 mg/dL  Glucose, capillary     Status: Abnormal   Collection Time: 01/07/17  3:59 AM  Result Value Ref Range   Glucose-Capillary 147 (H) 65 - 99 mg/dL  Glucose, capillary     Status: Abnormal   Collection Time: 01/07/17  8:13 AM  Result Value Ref Range   Glucose-Capillary 141 (H) 65 - 99 mg/dL   No results found.     Medical Problem List and [name]: 1.  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  DVT Prophylaxis/Anticoagulation: SCDs. Check vascular study 3. Pain Management: Ultram as  needed 4. Mood: Provide emotional support 5. Neuropsych: This patient is capable of making decisions on her own behalf. 6. Skin/Wound Care: Routine skin checks 7. Fluids/Electrolytes/Nutrition: Routine I&O with follow-up chemistries 8. Seizure prophylaxis. Keppra 500 mg every 12 hours 9. Hypertension.Nimotop as per protocol21 days. Currently day 14 of 21 10. Diabetes mellitus.SSI. Monitor closely while on Decadron. Patient on Glucophage 500 mg daily prior to admission. Resume as needed 11. Obesity. BMI 34.7 KG. Dietary follow-up  Post Admission Physician Evaluation: 1. Preadmission assessment reviewed and changes made below. 2. Functional deficits secondary to right posterior communicating artery aneurysm. 3. Patient is admitted to receive collaborative, interdisciplinary care between the physiatrist, rehab nursing staff, and therapy team. 4. Patient's level of medical complexity and substantial therapy needs in context of that medical necessity cannot be provided at a lesser intensity of care such as a SNF. 5. Patient has experienced substantial functional loss from his/her baseline which was documented above under the "Functional History" and "Functional Status" headings.  Judging by the patient's diagnosis, physical exam, and functional history, the patient has potential for functional progress which will result in measurable gains while on inpatient rehab.  These gains will be of substantial and practical use upon discharge  in facilitating mobility and self-care at the household level. 6. Physiatrist will provide 24 hour management of medical needs  as well as oversight of the therapy [name]/treatment and provide guidance as appropriate regarding the interaction of the two. 7. 24 hour rehab nursing will assist with bladder management, bowel management, safety, skin/wound care, disease management and patient education  and help integrate therapy concepts, techniques,education, etc. 8. PT will  assess and treat for/with: Lower extremity strength, range of motion, stamina, balance, functional mobility, safety, adaptive techniques and equipment, coping skills, pain control, education.   Goals are: Mod/Max A. 9. OT will assess and treat for/with: ADL's, functional mobility, safety, upper extremity strength, adaptive techniques and equipment, ego support, and community reintegration.   Goals are: Mod/Max A. Therapy may proceed with showering this patient. 10. SLP will assess and treat for/with: swallowing, speech.  Goals are: Mod I/Ind. 11. Case Management and Social Worker will assess and treat for psychological issues and discharge planning. 12. Team conference will be held weekly to assess progress toward goals and to determine barriers to discharge. 13. Patient will receive at least 3 hours of therapy per day at least 5 days per week. 14. ELOS: 12-16 days.       15. Prognosis:  good and fair  Delice Lesch, MD, Mellody Drown Cathlyn Parsons., PA-C 01/07/2017

## 2017-01-07 NOTE — Progress Notes (Signed)
Jamse Arn, MD Physician Signed Physical Medicine and Rehabilitation  Consult Note Date of Service: 01/01/2017 1:18 PM  Related encounter: ED to Hosp-Admission (Current) from 12/25/2016 in [place] All Collapse All   [] Hide copied text [] Hover for attribution information      Physical Medicine and Rehabilitation Consult Reason for Consult: Decreased functional mobility Referring Physician: Dr. Kathyrn Sheriff  HPI: Aimee [name] Plan is a 41 y.o. Hispanic right handed female with history of reported obesity, hypertension, diabetes mellitus. Per chart review patient lives with brother and sister. 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 Third level apartment with multiple stairs. Presented 12/25/2016 unresponsive with reported decerebrate posturing. Intubated in the ED. CT of the head reviewed, showing SAH.  Per report, diffuse subarachnoid hemorrhage filling CSF spaces in the suprasellar and basal cisterns as well as bilateral sulci. 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 Patient underwent coiling per interventional radiology. 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 Echocardiogram ejection fraction of 88% grade 1 diastolic dysfunction. Noted quadriparesis with ongoing follow-up per neurosurgery as well as neurology. Nasogastric tube in place for nutritional support. Physical and occupational therapy evaluations completed 01/01/2017 with recommendations of physical medicine rehabilitation consult.   Review of Systems  Constitutional: Negative for chills and fever.  Eyes: Positive for blurred vision.  Respiratory: Negative for cough and shortness of breath.   Cardiovascular: Positive for leg swelling. Negative for chest pain and palpitations.  Gastrointestinal: Positive for constipation. Negative for nausea and  vomiting.  Genitourinary: Negative for dysuria and urgency.  Musculoskeletal: Positive for myalgias. Negative for falls.  Skin: Negative for rash.  Neurological: Positive for dizziness, focal weakness and headaches. Negative for sensory change and seizures.  All other systems reviewed and are negative.      Past Medical History:  Diagnosis Date  . Diabetes mellitus without complication ([place])   . Hypertension         Past Surgical History:  Procedure Laterality Date  . IR ANGIO INTRA EXTRACRAN SEL INTERNAL CAROTID BILAT MOD SED  12/25/2016  . IR ANGIO VERTEBRAL SEL VERTEBRAL UNI L MOD SED  12/25/2016  . IR ANGIOGRAM FOLLOW UP STUDY  12/25/2016  . IR ANGIOGRAM FOLLOW UP STUDY  12/25/2016  . IR ANGIOGRAM FOLLOW UP STUDY  12/25/2016  . IR ANGIOGRAM FOLLOW UP STUDY  12/25/2016  . IR TRANSCATH/EMBOLIZ  12/25/2016  . RADIOLOGY WITH ANESTHESIA N/A 12/25/2016   Procedure: RADIOLOGY WITH ANESTHESIA;  Surgeon: Consuella Lose, MD;  Location: [facility];  Service: Radiology;  Laterality: N/A;   History reviewed. No pertinent family history of premature stroke.. Social History:  reports that she does not drink alcohol. Her tobacco and drug histories are not on file. Allergies: No Known Allergies       Medications Prior to Admission  Medication Sig Dispense Refill  . metFORMIN (GLUCOPHAGE) 500 MG tablet Take 500 mg by mouth daily with breakfast.      Home: Home Living Living Arrangements: Other relatives  Lives With: Family  Functional History: Functional Status:  Mobility:  ADL:  Cognition: Cognition Overall Cognitive Status: Impaired/Different from baseline Arousal/Alertness: Awake/alert Orientation Level: Oriented to person, Oriented to place, Oriented to time, Oriented to situation Attention: Focused, Sustained Focused Attention: Appears intact Sustained Attention: Impaired Sustained Attention Impairment: Verbal basic Memory: Appears intact Awareness:  Impaired Awareness Impairment:  Emergent impairment Problem Solving: Impaired Problem Solving Impairment: Verbal basic, Functional basic Executive Function: Initiating, Reasoning Reasoning: Impaired Reasoning Impairment: Verbal basic Initiating: Impaired Initiating Impairment: Verbal basic Cognition Overall Cognitive Status: Impaired/Different from baseline  Blood pressure (!) 145/88, pulse 71, temperature 98.5 F (36.9 C), temperature source Oral, resp. rate 15, height 5\' 4"  (1.626 m), weight 94 kg (207 lb 3.7 oz), SpO2 99 %. Physical Exam  Vitals reviewed. Constitutional: She appears well-developed.  Obese  HENT:  Head: Normocephalic and atraumatic.  +NG  Eyes: EOM are normal. Right eye exhibits no discharge. Left eye exhibits no discharge.  Pupils reactive to light  Neck: Normal range of motion. Neck supple. No thyromegaly present.  Cardiovascular: Normal rate and regular rhythm.   Respiratory: Effort normal and breath sounds normal. No respiratory distress.  GI: Soft. Bowel sounds are normal. She exhibits no distension.  Musculoskeletal: She exhibits no edema or tenderness.  Neurological: She is alert.  Makes eye contact with examiner.  Provides her name age and date of birth. Motor: RUE: 2+/5 hand grip, otherwise 0/5 throughout Sensation intact tol ight touch DTRs symmetric  Skin: Skin is warm and dry.  Psychiatric: She has a normal mood and affect. Her behavior is normal.    Lab Results Last 24 Hours       Results for orders placed or performed during the hospital encounter of 12/25/16 (from the past 24 hour(s))  Glucose, capillary     Status: Abnormal   Collection Time: 12/31/16  4:02 PM  Result Value Ref Range   Glucose-Capillary 150 (H) 65 - 99 mg/dL  Glucose, capillary     Status: Abnormal   Collection Time: 12/31/16  8:14 PM  Result Value Ref Range   Glucose-Capillary 129 (H) 65 - 99 mg/dL  Glucose, capillary     Status: Abnormal   Collection Time:  12/31/16 10:00 PM  Result Value Ref Range   Glucose-Capillary 219 (H) 65 - 99 mg/dL  Glucose, capillary     Status: Abnormal   Collection Time: 12/31/16 11:18 PM  Result Value Ref Range   Glucose-Capillary 190 (H) 65 - 99 mg/dL  CBC     Status: Abnormal   Collection Time: 01/01/17  4:00 AM  Result Value Ref Range   WBC 21.3 (H) 4.0 - 10.5 K/uL   RBC 4.76 3.87 - 5.11 MIL/uL   Hemoglobin 13.2 12.0 - 15.0 g/dL   HCT 40.6 36.0 - 46.0 %   MCV 85.3 78.0 - 100.0 fL   MCH 27.7 26.0 - 34.0 pg   MCHC 32.5 30.0 - 36.0 g/dL   RDW 15.6 (H) 11.5 - 15.5 %   Platelets 249 150 - 400 K/uL  Basic metabolic panel     Status: Abnormal   Collection Time: 01/01/17  4:00 AM  Result Value Ref Range   Sodium 137 135 - 145 mmol/L   Potassium 3.4 (L) 3.5 - 5.1 mmol/L   Chloride 102 101 - 111 mmol/L   CO2 25 22 - 32 mmol/L   Glucose, Bld 138 (H) 65 - 99 mg/dL   BUN 18 6 - 20 mg/dL   Creatinine, Ser 0.46 0.44 - 1.00 mg/dL   Calcium 8.2 (L) 8.9 - 10.3 mg/dL   GFR calc non Af Amer >60 >60 mL/min   GFR calc Af Amer >60 >60 mL/min   Anion gap 10 5 - 15  Magnesium     Status: None   Collection Time: 01/01/17  4:00 AM  Result Value Ref  Range   Magnesium 1.9 1.7 - 2.4 mg/dL  Phosphorus     Status: None   Collection Time: 01/01/17  4:00 AM  Result Value Ref Range   Phosphorus 3.8 2.5 - 4.6 mg/dL  Glucose, capillary     Status: Abnormal   Collection Time: 01/01/17  4:13 AM  Result Value Ref Range   Glucose-Capillary 145 (H) 65 - 99 mg/dL  Glucose, capillary     Status: Abnormal   Collection Time: 01/01/17  8:14 AM  Result Value Ref Range   Glucose-Capillary 140 (H) 65 - 99 mg/dL  Glucose, capillary     Status: Abnormal   Collection Time: 01/01/17 11:15 AM  Result Value Ref Range   Glucose-Capillary 185 (H) 65 - 99 mg/dL      Imaging Results (Last 48 hours)  Mr Brain Wo Contrast  Result Date: 12/30/2016 CLINICAL DATA:  Quadriparesis. Recent subarachnoid  hemorrhage and subsequent posterior communicating aneurysm coiling. EXAM: MRI HEAD WITHOUT CONTRAST MRI CERVICAL SPINE WITHOUT CONTRAST TECHNIQUE: Multiplanar, multiecho pulse sequences of the brain and surrounding structures, and cervical spine, to include the craniocervical junction and cervicothoracic junction, were obtained without intravenous contrast. COMPARISON:  12/26/2016 FINDINGS: MRI HEAD FINDINGS Brain: Scattered small embolic infarcts are again seen in the right greater than left cerebral hemispheres and left cerebellum with more confluent regions of confluent cortically based restricted diffusion in the frontoparietal regions at the vertex bilaterally. A few of the embolic infarcts are at most minimally more prominent than on the prior MRI (such as one in the posterior limb of the right internal capsule), however no new infarcts are identified. Frontoparietal gyral edema at the vertex bilaterally is similar to the prior study. FLAIR hyperintensity throughout cerebral sulci bilaterally is less extensive than on the prior study and likely reflects partial clearing of subarachnoid hemorrhage, with artifactually increased signal from mechanical ventilation and hyperoxygenation also potentially contributing to the residual signal. Intraventricular blood products have also decreased, with a small amount persisting in the right greater than left occipital horns. There is no ventricular dilatation. No mass, midline shift, or extra-axial fluid collection is seen. Vascular: Major intracranial vascular flow voids are preserved. Right posterior communicating aneurysm coiling. Skull and upper cervical spine: Unremarkable bone marrow signal. Skull hyperostosis. Sinuses/Orbits: Unremarkable orbits. Slightly decreased left maxillary sinus opacification by complex material. Clear mastoid air cells. Fluid in the pharynx. Other: None. MRI CERVICAL SPINE FINDINGS Alignment: Normal. Vertebrae: No fracture or evidence of  discitis. Unchanged subcentimeter C6 vertebral body lesion, likely benign. Cord: Normal signal and morphology. Posterior Fossa, vertebral arteries, paraspinal tissues: Partially visualized endotracheal and enteric tubes with moderate volume fluid in the pharynx. Disc levels: Unchanged mild cervical spondylosis including a small central disc protrusion at C4-5 and tiny central disc protrusions at C3-4 and C5-6 without significant spinal stenosis or spinal cord mass effect. IMPRESSION: 1. Overall similar appearance of scattered bilateral embolic infarcts and confluent cortical infarcts at the vertex bilaterally. 2. No new infarcts. 3. Decreasing subarachnoid and intraventricular hemorrhage. 4. Unremarkable appearance of the cervical spinal cord. Electronically Signed   By: Logan Bores M.D.   On: 12/30/2016 17:59   Mr Cervical Spine Wo Contrast  Result Date: 12/30/2016 CLINICAL DATA:  Quadriparesis. Recent subarachnoid hemorrhage and subsequent posterior communicating aneurysm coiling. EXAM: MRI HEAD WITHOUT CONTRAST MRI CERVICAL SPINE WITHOUT CONTRAST TECHNIQUE: Multiplanar, multiecho pulse sequences of the brain and surrounding structures, and cervical spine, to include the craniocervical junction and cervicothoracic junction, were obtained without intravenous contrast. COMPARISON:  12/26/2016 FINDINGS: MRI HEAD FINDINGS Brain: Scattered small embolic infarcts are again seen in the right greater than left cerebral hemispheres and left cerebellum with more confluent regions of confluent cortically based restricted diffusion in the frontoparietal regions at the vertex bilaterally. A few of the embolic infarcts are at most minimally more prominent than on the prior MRI (such as one in the posterior limb of the right internal capsule), however no new infarcts are identified. Frontoparietal gyral edema at the vertex bilaterally is similar to the prior study. FLAIR hyperintensity throughout cerebral sulci  bilaterally is less extensive than on the prior study and likely reflects partial clearing of subarachnoid hemorrhage, with artifactually increased signal from mechanical ventilation and hyperoxygenation also potentially contributing to the residual signal. Intraventricular blood products have also decreased, with a small amount persisting in the right greater than left occipital horns. There is no ventricular dilatation. No mass, midline shift, or extra-axial fluid collection is seen. Vascular: Major intracranial vascular flow voids are preserved. Right posterior communicating aneurysm coiling. Skull and upper cervical spine: Unremarkable bone marrow signal. Skull hyperostosis. Sinuses/Orbits: Unremarkable orbits. Slightly decreased left maxillary sinus opacification by complex material. Clear mastoid air cells. Fluid in the pharynx. Other: None. MRI CERVICAL SPINE FINDINGS Alignment: Normal. Vertebrae: No fracture or evidence of discitis. Unchanged subcentimeter C6 vertebral body lesion, likely benign. Cord: Normal signal and morphology. Posterior Fossa, vertebral arteries, paraspinal tissues: Partially visualized endotracheal and enteric tubes with moderate volume fluid in the pharynx. Disc levels: Unchanged mild cervical spondylosis including a small central disc protrusion at C4-5 and tiny central disc protrusions at C3-4 and C5-6 without significant spinal stenosis or spinal cord mass effect. IMPRESSION: 1. Overall similar appearance of scattered bilateral embolic infarcts and confluent cortical infarcts at the vertex bilaterally. 2. No new infarcts. 3. Decreasing subarachnoid and intraventricular hemorrhage. 4. Unremarkable appearance of the cervical spinal cord. Electronically Signed   By: Logan Bores M.D.   On: 12/30/2016 17:59   Dg Chest Port 1 View  Result Date: 12/31/2016 CLINICAL DATA:  Check endotracheal tube placement EXAM: PORTABLE CHEST 1 VIEW COMPARISON:  12/30/2016 FINDINGS: Cardiac shadow  is stable in appearance. Central venous line, endotracheal tube and nasogastric catheter are again noted and stable. The overall inspiratory effort is again poor without focal infiltrate or sizable effusion. IMPRESSION: Poor inspiratory effort. Tubes and lines as described. Electronically Signed   By: Inez Catalina M.D.   On: 12/31/2016 08:20   Dg Abd Portable 1v  Result Date: 12/31/2016 CLINICAL DATA:  Nasogastric tube placement EXAM: PORTABLE ABDOMEN - 1 VIEW COMPARISON:  Portable exam 1602 hours compared to 12/28/2016 FINDINGS: Nasogastric tube traverses stomach with tip projecting over expected position of the distal second portion of the duodenum. Gas filled upper normal caliber transverse colon. Visualized lung bases clear. IMPRESSION: Tip of nasogastric tube projects over expected position of the distal second portion of the duodenum. Electronically Signed   By: Lavonia Dana M.D.   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  Records reviewed and summated above. Stroke: Continue secondary stroke prophylaxis and Risk Factor Modification listed below:   Blood Pressure Management:  Continue current medication with prn's with permisive HTN per primary team B/l sided hemiparesis: fit for orthosis to prevent contractures (resting hand splint for day, wrist cock up splint at night, PRAFO, etc) Motor recovery: Fluoxetine  1. Does the need for close, 24  hr/day medical supervision in concert with the patient's rehab needs make it unreasonable for this patient to be served in a less intensive setting? Yes 2. Co-Morbidities requiring supervision/potential complications: obesity Body mass index is 34.81 kg/m., diet and exercise education, encourage weight loss to increase endurance and promote overall health), HTN (monitor and provide prns in accordance with increased  physical exertion and pain), diabetes mellitus (Monitor in accordance with exercise and adjust meds as necessary), leukocytosis (likely steroid induced, cont to monitor for signs and symptoms of infection, further workup if indicated) 3. Due to bladder management, bowel management, safety, skin/wound care, disease management, medication administration and patient education, does the patient require 24 hr/day rehab nursing? Yes 4. Does the patient require coordinated care of a physician, rehab nurse, PT (1-2 hrs/day, 5 days/week), OT (1-2 hrs/day, 5 days/week) and SLP (1-2 hrs/day, 5 days/week) to address physical and functional deficits in the context of the above medical diagnosis(es)? Yes Addressing deficits in the following areas: balance, endurance, locomotion, strength, transferring, bowel/bladder control, bathing, dressing, feeding, grooming, toileting, cognition, speech, language, swallowing and psychosocial support 5. Can the patient actively participate in an intensive therapy program of at least 3 hrs of therapy per day at least 5 days per week? Yes 6. The potential for patient to make measurable gains while on inpatient rehab is good and fair 7. Anticipated functional outcomes upon discharge from inpatient rehab are max assist and total assist  with PT, max assist and total assist with OT, supervision and min assist with SLP. 8. Estimated rehab length of stay to reach the above functional goals is: 25-30 days. 9. Anticipated D/C setting: Other 10. Anticipated post D/C treatments: SNF 11. Overall Rehab/Functional Prognosis: fair  RECOMMENDATIONS: This patient's condition is appropriate for continued rehabilitative care in the following setting: Will consider CIR to decrease burden of care. Patient has agreed to participate in recommended program. Potentially Note that insurance prior authorization may be required for reimbursement for recommended care.  Comment: Rehab Admissions  Coordinator to follow up.  Delice Lesch, MD, Mellody Drown Cathlyn Parsons., PA-C 01/01/2017    Revision History                   Routing History

## 2017-01-07 NOTE — Progress Notes (Addendum)
Inpatient Rehabilitation  I saw pt. at the bedside and spoke with Derenda Mis, brother, over the phone.  [name] states he and pt. and family will move in with sister in law Tawny Hopping in a one level home with 1 small step to enter.  Pt. and brother understand that pt. will discharge home with family at the end of CIR stay and that home services will likely not be available due to no payor source.  [name] states there will be 24 hour care providers between pt.s sister in law (Juan's wife) and Juan's wife's sister.  I await a return call from Dr. Kathyrn Sheriff for clearance to admit pt. to CIR today.  Please call if questions.  [facility] Admissions Coordinator Cell [phone] Office [phone]   ADDENDUM [id number]      I received a call back from [facility] with Dr. Garald Braver office with approval to admit pt. To CIR today.  Tammy, pt's RN is aware of plans.    Gerlean Ren

## 2017-01-07 NOTE — Progress Notes (Signed)
  Speech Language Pathology Treatment: Dysphagia  Patient Details Name: Aimee [name] MRN: [id number] DOB: 12-11-1975 Today's Date: 01/07/2017 Time: 1100-1120 SLP Time Calculation (min) (ACUTE ONLY): 20 min  Assessment / [name] / Recommendation Clinical Impression  Pt reports completing 30 repetition of RMT 3x a day over the weekend with assist from brother. Pt able to demonstrates use of RMT set at 20 cm H2O without difficulty, increased to 30, pt completed 8 with increased effort, struggled with last two reps, so set device to 25 cm H2O; pt completed 20 more reps with moderate effort. Able to achieve hoarse phonation (likely adduction of false vocal folds) with cues to growl and hum, approximated phonation at syllable level, but continues to whisper with functional speech. Breath support with tasks much improved. Trials thin water, 3 oz water test with immediate throat clear x1, also seen with small sips. Recommend repeat swallow testing, preferably MBS for potential upgrade of liquids (difficult to visualize quantity of aspiration below cords on FEES), but will defer to next level of care as pt will d/c to CIR today. Pt in agreement with [name].   HPI        SLP [name]  Continue with current [name] of care       Recommendations  Diet recommendations: Dysphagia 3 (mechanical soft);Nectar-thick liquid Liquids provided via: Cup;Straw Medication Administration: Whole meds with puree Supervision: Intermittent supervision to cue for compensatory strategies Compensations: Multiple dry swallows after each bite/sip                [name]: Continue with current [name] of care       GO               Herbie Baltimore, MA CCC-SLP [phone]  Lynann Beaver 01/07/2017, 11:37 AM

## 2017-01-07 NOTE — Care Management Note (Addendum)
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 Pt is from home- PTA independent. Plan will be for admission to CIR if cleared by Dr. Kathyrn Sheriff. Pt will have family support post d/c from CIR.                   Action/Plan: No further needs from CM at this time.   Expected Discharge Date:                  Expected Discharge Plan:  [place]  In-House Referral:  NA  Discharge planning Services  CM Consult  Post Acute Care Choice:  NA Choice offered to:  NA  DME Arranged:  N/A DME Agency:  NA  HH Arranged:  NA HH Agency:  NA  Status of Service:  Completed, signed off  If discussed at [place] of Stay Meetings, dates discussed:    Additional Comments:  Bethena Roys, RN 01/07/2017, 12:41 PM

## 2017-01-07 NOTE — Progress Notes (Signed)
Pt seen and examined. No issues overnight. No complaints this am  EXAM: Temp:  [98.1 F (36.7 C)-99 F (37.2 C)] 98.8 F (37.1 C) (06/04 0814) Pulse Rate:  [74-102] 74 (06/04 0814) Resp:  [13-22] 13 (06/04 0814) BP: (102-148)/(54-95) 148/91 (06/04 0814) SpO2:  [97 %-99 %] 98 % (06/04 0814) Weight:  [88.9 kg (196 lb)] 88.9 kg (196 lb) (06/04 0300) Intake/Output      06/03 0701 - 06/04 0700 06/04 0701 - 06/05 0700   I.V. (mL/kg) 1068.8 (12)    IV Piggyback 105    Total Intake(mL/kg) 1173.8 (13.2)    Urine (mL/kg/hr) 800 (0.4)    Stool 0 (0)    Total Output 800     Net +373.8          Urine Occurrence 7 x    Stool Occurrence 3 x     Awake, alert, oriented Speech fluent CN intact 3/5 RUE, 2/5 Left elbow extension, 3/5 grip 3/5 right hip flexion, 2/5 distal Minimal movement LLE  LABS: Lab Results  Component Value Date   CREATININE 0.49 01/03/2017   BUN 18 01/03/2017   NA 136 01/03/2017   K 4.0 01/03/2017   CL 104 01/03/2017   CO2 21 (L) 01/03/2017   Lab Results  Component Value Date   WBC 27.7 (H) 01/03/2017   HGB 14.1 01/03/2017   HCT 42.1 01/03/2017   MCV 84.7 01/03/2017   PLT 256 01/03/2017    IMPRESSION: - 41 y.o. female SAH d#14 s/p right Pcom coiling. Quadriparesis continues to improve. Medically stable  PLAN: - Dispo planning, hopefully CIR. Medically ready for transfer when bed available

## 2017-01-08 ENCOUNTER — Inpatient Hospital Stay (HOSPITAL_COMMUNITY): Payer: MEDICAID

## 2017-01-08 ENCOUNTER — Inpatient Hospital Stay (HOSPITAL_COMMUNITY): Payer: Self-pay | Admitting: Speech Pathology

## 2017-01-08 ENCOUNTER — Inpatient Hospital Stay (HOSPITAL_COMMUNITY): Payer: Self-pay | Admitting: Occupational Therapy

## 2017-01-08 ENCOUNTER — Inpatient Hospital Stay (HOSPITAL_COMMUNITY): Payer: Self-pay

## 2017-01-08 DIAGNOSIS — I10 Essential (primary) hypertension: Secondary | ICD-10-CM

## 2017-01-08 DIAGNOSIS — D72829 Elevated white blood cell count, unspecified: Secondary | ICD-10-CM

## 2017-01-08 DIAGNOSIS — G8250 Quadriplegia, unspecified: Secondary | ICD-10-CM

## 2017-01-08 DIAGNOSIS — M7989 Other specified soft tissue disorders: Secondary | ICD-10-CM

## 2017-01-08 LAB — CBC WITH DIFFERENTIAL/PLATELET
Basophils Absolute: 0 10*3/uL (ref 0.0–0.1)
Basophils Relative: 0 %
Eosinophils Absolute: 0 10*3/uL (ref 0.0–0.7)
Eosinophils Relative: 0 %
HCT: 45.5 % (ref 36.0–46.0)
Hemoglobin: 15.1 g/dL — ABNORMAL HIGH (ref 12.0–15.0)
Lymphocytes Relative: 4 %
Lymphs Abs: 1 10*3/uL (ref 0.7–4.0)
MCH: 28 pg (ref 26.0–34.0)
MCHC: 33.2 g/dL (ref 30.0–36.0)
MCV: 84.3 fL (ref 78.0–100.0)
Monocytes Absolute: 1.3 10*3/uL — ABNORMAL HIGH (ref 0.1–1.0)
Monocytes Relative: 6 %
Neutro Abs: 20.2 10*3/uL — ABNORMAL HIGH (ref 1.7–7.7)
Neutrophils Relative %: 90 %
Platelets: 302 10*3/uL (ref 150–400)
RBC: 5.4 MIL/uL — ABNORMAL HIGH (ref 3.87–5.11)
RDW: 16.4 % — ABNORMAL HIGH (ref 11.5–15.5)
WBC: 22.5 10*3/uL — ABNORMAL HIGH (ref 4.0–10.5)

## 2017-01-08 LAB — GLUCOSE, CAPILLARY
Glucose-Capillary: 145 mg/dL — ABNORMAL HIGH (ref 65–99)
Glucose-Capillary: 136 mg/dL — ABNORMAL HIGH (ref 65–99)
Glucose-Capillary: 117 mg/dL — ABNORMAL HIGH (ref 65–99)
Glucose-Capillary: 133 mg/dL — ABNORMAL HIGH (ref 65–99)
Glucose-Capillary: 166 mg/dL — ABNORMAL HIGH (ref 65–99)

## 2017-01-08 LAB — COMPREHENSIVE METABOLIC PANEL WITH GFR
ALT: 36 U/L (ref 14–54)
AST: 22 U/L (ref 15–41)
Albumin: 3.4 g/dL — ABNORMAL LOW (ref 3.5–5.0)
Alkaline Phosphatase: 57 U/L (ref 38–126)
Anion gap: 12 (ref 5–15)
BUN: 22 mg/dL — ABNORMAL HIGH (ref 6–20)
CO2: 22 mmol/L (ref 22–32)
Calcium: 8.7 mg/dL — ABNORMAL LOW (ref 8.9–10.3)
Chloride: 101 mmol/L (ref 101–111)
Creatinine, Ser: 0.6 mg/dL (ref 0.44–1.00)
GFR calc Af Amer: >60 mL/min (ref >60)
GFR calc non Af Amer: >60 mL/min (ref >60)
Glucose, Bld: 126 mg/dL — ABNORMAL HIGH (ref 65–99)
Potassium: 4.5 mmol/L (ref 3.5–5.1)
Sodium: 135 mmol/L (ref 135–145)
Total Bilirubin: 1.4 mg/dL — ABNORMAL HIGH (ref 0.3–1.2)
Total Protein: 6.1 g/dL — ABNORMAL LOW (ref 6.5–8.1)

## 2017-01-08 LAB — URINALYSIS, ROUTINE W REFLEX MICROSCOPIC
Bilirubin Urine: NEGATIVE
Glucose, UA: NEGATIVE mg/dL
Hgb urine dipstick: NEGATIVE
Ketones, ur: NEGATIVE mg/dL
Leukocytes, UA: NEGATIVE
Nitrite: NEGATIVE
Protein, ur: NEGATIVE mg/dL
Specific Gravity, Urine: 1.023 (ref 1.005–1.030)
pH: 5 (ref 5.0–8.0)

## 2017-01-08 NOTE — Evaluation (Signed)
Speech Language Pathology Assessment and [name]  Patient Details  Name: Aimee [name] MRN: [id number] Date of Birth: [birth year]  SLP Diagnosis: Cognitive Impairments;Dysphagia;Voice disorder  Rehab Potential: Good ELOS: 4 weeks    Today's Date: 01/08/2017 SLP Individual Time: 9532-0233 SLP Individual Time Calculation (min): 57 min   Problem List:  Patient Active Problem List   Diagnosis Date Noted  . Seizure prophylaxis   . 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 Class 1 obesity due to excess calories with serious comorbidity and body mass index (BMI) of 30.0 to 30.9 in adult   . Benign essential HTN   . Diabetes mellitus type 2 in obese ([place])   . Leukocytosis   . Acute encephalopathy   . Somnolence   . Seizure ([place])   . Stupor   . Ventilator dependent ([place])   . SAH (subarachnoid hemorrhage) ([place]) 12/25/2016  . 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 Diabetes mellitus without complication ([place])   . Hypertension    Past Surgical History:  Past Surgical History:  Procedure Laterality Date  . IR ANGIO INTRA EXTRACRAN SEL INTERNAL CAROTID BILAT MOD SED  12/25/2016  . IR ANGIO VERTEBRAL SEL VERTEBRAL UNI L MOD SED  12/25/2016  . IR ANGIOGRAM FOLLOW UP STUDY  12/25/2016  . IR ANGIOGRAM FOLLOW UP STUDY  12/25/2016  . IR ANGIOGRAM FOLLOW UP STUDY  12/25/2016  . IR ANGIOGRAM FOLLOW UP STUDY  12/25/2016  . IR TRANSCATH/EMBOLIZ  12/25/2016  . RADIOLOGY WITH ANESTHESIA N/A 12/25/2016   Procedure: RADIOLOGY WITH ANESTHESIA;  Surgeon: Consuella Lose, MD;  Location: [facility];  Service: Radiology;  Laterality: N/A;    Assessment / [name] / Recommendation Clinical Impression Aimee [name] a 41 y.o.Hispanic right handed femalewith history of reported obesity, hypertension, diabetes mellitus.Per chart review, patient lives with brother and sister. 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  Third level apartment with multiple stairs.Presented 12/25/2016 unresponsive with reported decerebrate posturing. Intubated in the ED. CT of the head reviewed, showing SAH. 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 Patient underwent coiling per interventional radiology.Keppra ongoing for seizure prophylaxis. 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 Echocardiogram ejection fraction of 43% grade 1 diastolic dysfunction. Noted quadriparesis with ongoing follow-up per neurosurgery as well as neurology.Patient currently maintained on Decadron protocol. Blood pressure monitored with Nimotop. Nasogastric tube in place for nutritional support and died advanced to mechanical soft nectar thick liquids .Physical and occupational therapy evaluations completed 01/01/2017 with recommendations of physical medicine rehabilitation consult.Patient was admitted for a comprehensive rehabilitation program 01/07/17.  Bedside swallow evaluation and cognitive-linguistic evaluation complete.  Patient with mild left sided oral weakness resulting in mild oral residue and throat clearing following regular texture trials.  Dys.3 textures resulted in no overt s/s of aspiration.  Trials of ice chips resulted in consistent throat clears and nectar-thick liquids consumed via large portions at a quick rate also resulted in intermittent throat clears.  Cues for small sips at a slow rate effectively prevented any further overt s/s of aspiration.  Vocal quality remains aphonic-dysphonic with ability to phonate with Max assist encouragement.  Vocal fold adduction also appeared to be achieved with coughing; however, humming and throat clears are  not effective at achieving adduction.  Montreal Cognitive  Assessment, Blind Version 7.1 administered due to patient not being able to write.  Patient demonstrated skills consistent with a score of 18/22, which is considered to be [facility]; however, patient with 80% accuracy with delayed recall and no correct responses with math calculations.  As a result, high-level deficits are noted and will be addressed.  These deficits impact the patient's overall safety with functional self-care tasks. Patient would benefit from skilled SLP intervention in order to maximize her functional independence prior to discharge. Anticipate patient will require 24 hour supervision at home and follow up SLP service recommendations are to be determined based upon her progress.    Skilled Therapeutic Interventions          Skilled treatment initiated with focus on addressing dysphagia goals. SLP facilitated session by providing Mod assist faded to Supervision level verbal cues for portion control and pacing with nectar-thick liquid consumption to eliminate overt s/s of aspiration.  SLP also facilitated session with set-up and Min assist cues for completion of RMT at 20 cm H2O, with patient reporting that it was too easy.  As a result, device was set at 30 cm H2O which patient completed 3 sets of 10 as she was initially taught with effort of 7/10 per her report.  SLP educated patient on 5 reps 5 times being our recommendation.  Will continue to monitor progress.      SLP Assessment  Patient will need skilled Speech Lanaguage Pathology Services during CIR admission    Recommendations  SLP Diet Recommendations: Dysphagia 3 (Mech soft);Nectar Liquid Administration via: Cup;Straw Medication Administration: Whole meds with liquid Supervision: Intermittent supervision to cue for compensatory strategies;Patient able to self feed (Set-up assist ) Compensations: Slow rate;Small sips/bites Postural Changes and/or Swallow Maneuvers: Seated upright 90 degrees Oral Care Recommendations: Oral care  BID Recommendations for Other Services: Therapeutic Recreation consult Therapeutic Recreation Interventions: Other (comment) Patient destination: Home Follow up Recommendations: 24 hour supervision/assistance Equipment Recommended: To be determined    SLP Frequency 3 to 5 out of 7 days   SLP Duration  SLP Intensity  SLP Treatment/Interventions 4 weeks  Minumum of 1-2 x/day, 30 to 90 minutes  Cognitive remediation/compensation;Cueing hierarchy;Dysphagia/aspiration precaution training;Environmental controls;Functional tasks;Internal/external aids;Medication managment;Patient/family education;Other (comment) (RMT exercises )    Pain Pain Assessment Pain Assessment: No/denies pain  Prior Functioning Cognitive/Linguistic Baseline: Within functional limits Type of Home: House (moving to a new house with sister in law (pt lived in apt))  Lives With: Family Available Help at Discharge: Family;Available 24 hours/day 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 Needs to repeat parts of sentences.  Social Interaction Social Interaction assist level: Interacts appropriately with others with medication or extra time (anti-anxiety, antidepressant).  Problem Solving Problem solving assist level: Solves basic problems with no assist  Memory Memory assist level: Recognizes or recalls 75 - 89% of the time/requires cueing 10 - 24% of the time   Short Term Goals: Week 1: SLP Short Term Goal 1 (Week 1): Patient will recall new information related to  her care  with Min question cues.  SLP Short Term Goal 2 (Week 1): Patient will solve complex problems with Min assist question cues to recognize and correct errors. SLP Short Term Goal 3 (Week 1): Patient will consume trials of ice chips with minimal overt s/s of aspiration to determine readiness for advancement or objective swallow assessment  SLP Short Term Goal 4 (Week 1): Patient will perform EMST exercises at 30 cm H2O for 5 sets of 5 reps with a self-perceived effort level of 8/10 with Min verbal cues for accuracy with use of device. SLP Short Term Goal 5 (Week 1): Patient will achieve phonation at the word level in structured tasks with Mod assist verbal cues  Refer to Care [name] for Long Term Goals  Recommendations for other services: Therapeutic Recreation  Kitchen group and Other as appropriate  Discharge Criteria: Patient will be discharged from SLP if patient refuses treatment 3 consecutive times without medical reason, if treatment goals not met, if there is a change in medical status, if patient makes no progress towards goals or if patient is discharged from hospital.  The above assessment, treatment [name], treatment alternatives and goals were discussed and mutually agreed upon: by patient  Carmelia Roller., [place]  [place] 01/08/2017, 4:40 PM

## 2017-01-08 NOTE — Progress Notes (Signed)
[place] PHYSICAL MEDICINE & REHABILITATION     PROGRESS NOTE    Subjective/Complaints: Had a good night. Tired from activities of the day. Denies pain. Feels that things went well  ROS: pt denies nausea, vomiting, diarrhea, cough, shortness of breath or chest pain   Objective: Vital Signs: Blood pressure (!) 133/95, pulse 86, temperature 98.7 F (37.1 C), temperature source Oral, resp. rate 16, weight 89.9 kg (198 lb 4.1 oz), last menstrual period 12/31/2016, SpO2 100 %. No results found.  Recent Labs  01/08/17 0546  WBC 22.5*  HGB 15.1*  HCT 45.5  PLT 302    Recent Labs  01/08/17 0546  NA 135  K 4.5  CL 101  GLUCOSE 126*  BUN 22*  CREATININE 0.60  CALCIUM 8.7*   CBG (last 3)   Recent Labs  01/07/17 2339 01/08/17 0345 01/08/17 0753  GLUCAP 135* 145* 136*    Wt Readings from Last 3 Encounters:  01/08/17 89.9 kg (198 lb 4.1 oz)  01/07/17 88.9 kg (196 lb)    Physical Exam:  Constitutional: She appears well-developed.  No distress HENT:  Head: Normocephalic and atraumatic.  Eyes: EOM are normal. Right eye exhibits no discharge. Left eye exhibits no discharge.  Neck: Normal range of motion. Neck supple. No thyromegaly present.  Cardiovascular: RRR    Respiratory: CTA B GI: Soft. Bowel sounds are normal. She exhibits no distension.  Musculoskeletal: She exhibits no edema or tenderness.  Neurological:  Patient is alert and oriented.     Motor: RUE: 4+/5 proximal to distal LUE: Shoulder abduction 2+ to 3-/5, elbow flex/ext 2+/5, hand grip 4-/5 RLE: 3-/5 right hip flexion, KE, 4/5 ADF/PF LLE: 1-2/5 proximal to distal  Skin: Skin is warm and dry.  Psychiatric: She has a normal mood and affect. Her behavior is normal.    Assessment/Plan: 1. 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 Physiatrist is  providing close team supervision and 24 hour management of active medical problems listed below. Physiatrist and rehab team continue to assess barriers to discharge/monitor patient progress toward functional and medical goals.  Function:  Bathing Bathing position      Bathing parts      Bathing assist        Upper Body Dressing/Undressing Upper body dressing                    Upper body assist        Lower Body Dressing/Undressing Lower body dressing                                  Lower body assist        Toileting Toileting          Toileting assist     Transfers Chair/bed transfer             Locomotion Ambulation           Wheelchair          Cognition Comprehension Comprehension assist level: Understands basic 75 - 89% of the time/ requires cueing 10 - 24% of the time  Expression Expression assist level: Expresses basic 50 - 74% of the time/requires cueing 25 - 49% of the time. Needs to repeat parts of sentences.  Social Interaction Social Interaction assist level: Interacts appropriately 90% of the  time - Needs monitoring or encouragement for participation or interaction.  Problem Solving Problem solving assist level: Solves basic 75 - 89% of the time/requires cueing 10 - 24% of the time  Memory Memory assist level: Recognizes or recalls 75 - 89% of the time/requires cueing 10 - 24% of the time   Medical Problem List and Plan: 1.  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  DVT Prophylaxis/Anticoagulation: SCDs. Check vascular study 3. Pain Management: Ultram as needed 4. Mood: Provide emotional support 5. Neuropsych: This patient is capable of making decisions on her own behalf. 6. Skin/Wound Care: Routine skin checks 7.  Fluids/Electrolytes/Nutrition: Routine I&O with follow-up chemistries all reviewed personally today  -encourage PO 8. Seizure prophylaxis. Keppra 500 mg every 12 hours 9. Hypertension.Nimotop as per protocol21 days. Currently day 14 of 21 10. Diabetes mellitus.SSI. Monitor closely while on Decadron. Patient on Glucophage 500 mg daily prior to admission. Resume as needed 11. Obesity. BMI 34.7 KG. Dietary follow-up 12. Leukyctosis: likely reactive due to steroids  -begin wean of steroids  LOS (Days) 1 A FACE TO FACE EVALUATION WAS PERFORMED  Alger Simons T, MD 01/08/2017 8:56 AM

## 2017-01-08 NOTE — Evaluation (Signed)
Physical Therapy Assessment and [name]  Patient Details  Name: Aimee [name] MRN: [id number] Date of Birth: [birth year]  PT Diagnosis: Abnormal posture, Coordination disorder, Difficulty walking, Edema, Impaired cognition, Impaired sensation, Muscle weakness, Pain in joint, Paralysis and Quadriplegia Rehab Potential: Good ELOS: 25-28 days   Today's Date: 01/08/2017 PT Individual Time: 1300-1400 PT Individual Time Calculation (min): 60 min    Problem List:  Patient Active Problem List   Diagnosis Date Noted  . Seizure prophylaxis   . Quadriparesis ([place])   . Class 1 obesity due to excess calories with serious comorbidity and body mass index (BMI) of 30.0 to 30.9 in adult   . Benign essential HTN   . Diabetes mellitus type 2 in obese ([place])   . Leukocytosis   . Acute encephalopathy   . Somnolence   . Seizure ([place])   . Stupor   . Ventilator dependent ([place])   . SAH (subarachnoid hemorrhage) ([place]) 12/25/2016  . Subarachnoid hemorrhage due to ruptured aneurysm ([place]) 12/25/2016    Past Medical History:  Past Medical History:  Diagnosis Date  . Diabetes mellitus without complication ([place])   . Hypertension    Past Surgical History:  Past Surgical History:  Procedure Laterality Date  . IR ANGIO INTRA EXTRACRAN SEL INTERNAL CAROTID BILAT MOD SED  12/25/2016  . IR ANGIO VERTEBRAL SEL VERTEBRAL UNI L MOD SED  12/25/2016  . IR ANGIOGRAM FOLLOW UP STUDY  12/25/2016  . IR ANGIOGRAM FOLLOW UP STUDY  12/25/2016  . IR ANGIOGRAM FOLLOW UP STUDY  12/25/2016  . IR ANGIOGRAM FOLLOW UP STUDY  12/25/2016  . IR TRANSCATH/EMBOLIZ  12/25/2016  . RADIOLOGY WITH ANESTHESIA N/A 12/25/2016   Procedure: RADIOLOGY WITH ANESTHESIA;  Surgeon: Consuella Lose, MD;  Location: [place];  Service: Radiology;  Laterality: N/A;    Assessment & [name] Clinical Impression: Patient is a 41 y.o. Hispanic right handed femalewith history of reported obesity, hypertension, diabetes mellitus.Per chart review, patient  lives with brother and sister. 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 Third level apartment with multiple stairs.Presented 12/25/2016 unresponsive with reported decerebrate posturing. Intubated in the ED. CT of the head reviewed, showing SAH. 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 Patient underwent coiling per interventional radiology.Keppra ongoing for seizure prophylaxis. 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 Echocardiogram ejection fraction of 49% grade 1 diastolic dysfunction. Noted quadriparesis with ongoing follow-up per neurosurgery as well as neurology.Patient currently maintained on Decadron protocol. Blood pressure monitored with Nimotop. Nasogastric tube in place for nutritional support and died advanced to mechanical soft nectar thick liquids .Physical and occupational therapy evaluations completed 01/01/2017 with recommendations of physical medicine rehabilitation consult.  Patient transferred to CIR on 01/07/2017 .   Patient currently requires total +2 with mobility secondary to muscle weakness, muscle joint tightness and muscle paralysis, decreased cardiorespiratoy endurance, abnormal tone, unbalanced muscle activation, motor apraxia, decreased coordination and decreased motor planning, decreased visual acuity, decreased awareness, decreased problem solving and decreased memory and decreased sitting balance, decreased standing balance, decreased postural control and decreased balance strategies.  Prior to hospitalization, patient was independent  with mobility and lived with Family in a House (moving to a new house with sister in [place] (pt lived in apt)) home.  Home access is 1 (1 STE  at sister in laws home; pt's apt 3 flights of  stairs)Stairs to enter.  Patient will benefit from skilled PT intervention to maximize safe functional mobility, minimize fall risk and decrease caregiver burden for planned discharge home with 24 hour assist.  Anticipate patient will benefit from follow up [facility] at discharge.  PT - End of Session Activity Tolerance: Decreased this session Endurance Deficit: Yes Endurance Deficit Description: fatigues easily in sitting PT Assessment Rehab Potential (ACUTE/IP ONLY): Good Barriers to Discharge:  (will need to get home measurements for w/c) PT Patient demonstrates impairments in the following area(s): Balance;Edema;Endurance;Motor;Pain;Safety;Sensory;Skin Integrity PT Transfers Functional Problem(s): Bed Mobility;Bed to Chair;Car;Furniture PT Locomotion Functional Problem(s): Ambulation;Wheelchair Mobility;Stairs PT [name] PT Intensity: Minimum of 1-2 x/day ,45 to 90 minutes PT Frequency: 5 out of 7 days PT Duration Estimated Length of Stay: 25-28 days PT Treatment/Interventions: Ambulation/gait training;Balance/vestibular training;Cognitive remediation/compensation;Community reintegration;Discharge planning;Disease management/prevention;DME/adaptive equipment instruction;Functional mobility training;Neuromuscular re-education;Pain management;Patient/family education;Psychosocial support;Skin care/wound management;Splinting/orthotics;Stair training;Therapeutic Activities;Therapeutic Exercise;UE/LE Strength taining/ROM;UE/LE Coordination activities;Visual/perceptual remediation/compensation;Wheelchair propulsion/positioning PT Transfers Anticipated Outcome(s): mod assist w/c level PT Locomotion Anticipated Outcome(s): supervision w/c mobility (power vs manual pending functional return) PT Recommendation Recommendations for Other Services: Neuropsych consult;Therapeutic Recreation consult Therapeutic Recreation Interventions: Stress management;Other (comment) (leisure) Follow Up Recommendations: Home  health PT;24 hour supervision/assistance Patient destination: Home Equipment Recommended: Wheelchair cushion (measurements);Wheelchair (measurements);To be determined  Skilled Therapeutic Intervention Evaluation completed (see details above and below) with education on PT POC and goals and individual treatment initiated with focus on neuro re-ed to address postural control and balance, bed mobility including rolling for donning of sling, use of hoyer lift for transfer OOB initially for safety due to air mattress with +2 assist, initiating slideboard transfer with +2 assist with focus on head/hips relationship and ability to practice feeling her body move in space (difficulty with motor planning noted throughout session), and PT began adjustment of w/c for improved postural alignment and positioning of BLE in neutral position. Educated on pressure relief and need for every 30 min when sitting up w/c. Pt verbalized understanding and declines any symptoms of orthostasis throughout session.    PT Evaluation Precautions/Restrictions Precautions Precautions: Fall;Other (comment) Precaution Comments: quadriplegia (LUE/LLE more impaired) PRAFOs Restrictions Weight Bearing Restrictions: No RLE Weight Bearing: Weight bearing as tolerated LLE Weight Bearing: Weight bearing as tolerated Pain Denies pain but reports generalized discomfort Home Living/Prior Functioning Home Living Available Help at Discharge: Family;Available 24 hours/day Type of Home: House (moving to a new house with sister in law (pt lived in apt)) Home Access: Stairs to enter Technical brewer of Steps: 1 (1 STE at sister in laws home; pt's apt 3 flights of stairs) Home Layout: One level Bathroom Shower/Tub: Tub/shower unit Additional Comments: [name] to d/c to sister in [place] (1 STE). 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 Leisure: Hobbies-yes (Comment) Comments: enjoys going out to the movies,hiking, and going to the beach/lake, Vision/Perception  Vision - Assessment Eye Alignment: Within Functional Limits Ocular Range of Motion: Restricted on the left Alignment/Gaze Preference: Within Defined Limits Tracking/Visual Pursuits: Left eye does not track laterally;Requires cues, head turns, or add eye shifts to track Perception Perception: Impaired Inattention/Neglect: Does not attend to left visual field;Other (comment) (decreased awareness of LUE) Praxis Praxis: Impaired Praxis Impairment Details:  Motor planning  Cognition Overall Cognitive Status: Impaired/Different from baseline Arousal/Alertness: Awake/alert Memory: Impaired Memory Impairment: Decreased recall of new information Problem Solving: Impaired Problem Solving Impairment: Verbal basic;Functional basic Safety/Judgment: Appears intact Sensation Sensation Light Touch: Impaired by gross assessment Stereognosis: Impaired by gross assessment Hot/Cold: Not tested Proprioception: Impaired by gross assessment Additional Comments: able to identify light touch in BLE Coordination Gross Motor Movements are Fluid and Coordinated: No Fine Motor Movements are Fluid and Coordinated: No Coordination and Movement Description: RUE has full AROM, but movement pattern is slow and irregular Motor  Motor Motor: Abnormal postural alignment and control;Motor apraxia;Abnormal tone;Tetraplegia     Trunk/Postural Assessment  Cervical Assessment Cervical Assessment: Within Functional Limits Thoracic Assessment Thoracic Assessment: Exceptions to [facility] (maintains flexed posture) Lumbar Assessment Lumbar Assessment: Exceptions to [facility] (posterior pelvic tilt) Postural  Control Postural Control: Deficits on evaluation Trunk Control: impaired Protective Responses: delayed and inadequate  Balance Balance Balance Assessed: Yes Static Sitting Balance Static Sitting - Level of Assistance: 4: Min assist Dynamic Sitting Balance Dynamic Sitting - Level of Assistance: 3: Mod assist;2: Max assist;4: Min assist Static Standing Balance Static Standing - Level of Assistance: Not tested (comment) (attempted several times with +2) during OT session in AM Extremity Assessment  RUE Assessment RUE Assessment: Exceptions to [facility] RUE AROM (degrees) RUE Overall AROM Comments: WFL RUE Strength RUE Overall Strength Comments: 3+/5 shoulder, 4-/5 elbow and grasp LUE Assessment LUE Assessment: Exceptions to WFL LUE AROM (degrees) LUE Overall AROM Comments: sh flex to 30 degrees, 80% of range with elbow flex and ext, full grasp LUE Strength LUE Overall Strength Comments: shoulder 2-/5, elbow 3-/5, grasp 3/5 RLE Assessment RLE Assessment: Exceptions to [facility] RLE Strength RLE Overall Strength Comments: grossly 2+ to 3-/5; impaired coordination and motor planning noted (extensor tone noted during transfers/weightbearing attempts) LLE Assessment LLE Assessment: Exceptions to [facility] LLE Strength LLE Overall Strength Comments: no active movement noted except activation at quads   See Function Navigator for Current Functional Status.   Refer to Care [name] for Long Term Goals  Recommendations for other services: Neuropsych and Therapeutic Recreation  Stress management and Other leisure  Discharge Criteria: Patient will be discharged from PT if patient refuses treatment 3 consecutive times without medical reason, if treatment goals not met, if there is a change in medical status, if patient makes no progress towards goals or if patient is discharged from hospital.  The above assessment, treatment [name], treatment alternatives and goals were discussed and mutually agreed upon: by  patient and by family  Juanna Cao, PT, DPT  01/08/2017, 3:55 PM

## 2017-01-08 NOTE — Evaluation (Signed)
Occupational Therapy Assessment and [name]  Patient Details  Name: Aimee [name] MRN: [id number] Date of Birth: 04-01-1976  OT Diagnosis: abnormal posture, apraxia, cognitive deficits, disturbance of vision and quadriparesis with RUE>LUE Rehab Potential: Rehab Potential (ACUTE ONLY): Good ELOS: 28-30 days   Today's Date: 01/08/2017 OT Individual Time: 1000-1115 OT Individual Time Calculation (min): 75 min     Problem List:  Patient Active Problem List   Diagnosis Date Noted  . Seizure prophylaxis   . Quadriparesis ([place])   . Class 1 obesity due to excess calories with serious comorbidity and body mass index (BMI) of 30.0 to 30.9 in adult   . Benign essential HTN   . Diabetes mellitus type 2 in obese ([place])   . Leukocytosis   . Acute encephalopathy   . Somnolence   . Seizure ([place])   . Stupor   . Ventilator dependent ([place])   . SAH (subarachnoid hemorrhage) ([place]) 12/25/2016  . Subarachnoid hemorrhage due to ruptured aneurysm ([place]) 12/25/2016    Past Medical History:  Past Medical History:  Diagnosis Date  . Diabetes mellitus without complication ([place])   . Hypertension    Past Surgical History:  Past Surgical History:  Procedure Laterality Date  . IR ANGIO INTRA EXTRACRAN SEL INTERNAL CAROTID BILAT MOD SED  12/25/2016  . IR ANGIO VERTEBRAL SEL VERTEBRAL UNI L MOD SED  12/25/2016  . IR ANGIOGRAM FOLLOW UP STUDY  12/25/2016  . IR ANGIOGRAM FOLLOW UP STUDY  12/25/2016  . IR ANGIOGRAM FOLLOW UP STUDY  12/25/2016  . IR ANGIOGRAM FOLLOW UP STUDY  12/25/2016  . IR TRANSCATH/EMBOLIZ  12/25/2016  . RADIOLOGY WITH ANESTHESIA N/A 12/25/2016   Procedure: RADIOLOGY WITH ANESTHESIA;  Surgeon: Consuella Lose, MD;  Location: [place];  Service: Radiology;  Laterality: N/A;    Assessment & [name] Clinical Impression: Aimee [name] is a 41 y.o. Hispanic right handed female with history of reported obesity, hypertension, diabetes mellitus. Per chart review, patient lives with brother and  sister. 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 Third level apartment with multiple stairs. Presented 12/25/2016 unresponsive with reported decerebrate posturing. Intubated in the ED. CT of the head reviewed, showing SAH.  Per report, diffuse subarachnoid hemorrhage filling CSF spaces in the suprasellar and basal cisterns as well as bilateral sulci. 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 Patient underwent coiling per interventional radiology.Keppra ongoing for seizure prophylaxis. 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 Echocardiogram ejection fraction of 19% grade 1 diastolic dysfunction. Noted quadriparesis with ongoing follow-up per neurosurgery as well as neurology. Patient currently maintained on Decadron protocol. Blood pressure monitored with Nimotop. Nasogastric tube in place for nutritional support and died advanced to mechanical soft nectar thick liquids . Physical and occupational therapy evaluations completed 01/01/2017 with recommendations of physical medicine rehabilitation consult. Patient was admitted for a comprehensive rehabilitation program   Patient transferred to CIR on 01/07/2017 .    Patient currently requires total with basic self-care skills secondary to muscle weakness and muscle paralysis, abnormal tone, unbalanced muscle activation and motor apraxia, decreased visual acuity, decreased visual perceptual skills, decreased visual motor skills and field cut, decreased attention to left, decreased problem solving, decreased memory and delayed processing and decreased sitting balance, decreased postural control and decreased balance strategies.  Prior to hospitalization, patient was fully independent and working 2 jobs.  Patient will benefit from skilled intervention to  increase independence with basic self-care skills prior to discharge home with care  partner.  Anticipate patient will require minimal physical assistance and follow up home health.  OT - End of Session Activity Tolerance: Tolerates 10 - 20 min activity with multiple rests Endurance Deficit: Yes Endurance Deficit Description: fatigues easily in sitting OT Assessment Rehab Potential (ACUTE ONLY): Good OT Patient demonstrates impairments in the following area(s): Balance;Cognition;Endurance;Motor;Sensory;Vision OT Basic ADL's Functional Problem(s): Eating;Grooming;Bathing;Dressing;Toileting OT Transfers Functional Problem(s): Toilet;Tub/Shower OT Additional Impairment(s): Fuctional Use of Upper Extremity OT [name] OT Intensity: Minimum of 1-2 x/day, 45 to 90 minutes OT Frequency: 5 out of 7 days OT Duration/Estimated Length of Stay: 28-30 days OT Treatment/Interventions: Balance/vestibular training;Cognitive remediation/compensation;Discharge planning;DME/adaptive equipment instruction;Functional electrical stimulation;Functional mobility training;Neuromuscular re-education;Patient/family education;Self Care/advanced ADL retraining;Psychosocial support;Therapeutic Activities;Therapeutic Exercise;UE/LE Strength taining/ROM;UE/LE Coordination activities;Visual/perceptual remediation/compensation OT Self Feeding Anticipated Outcome(s): set up OT Basic Self-Care Anticipated Outcome(s): min A OT Toileting Anticipated Outcome(s): min A OT Bathroom Transfers Anticipated Outcome(s): min A OT Recommendation Patient destination: Home Follow Up Recommendations: Home health OT Equipment Recommended: Tub/shower bench;3 in 1 bedside comode   Skilled Therapeutic Intervention Pt seen for initial evaluation and ADL retraining.  Education with pt on role of OT, OT POC, anticipated goals. Pt sat to EOB with max A.  Attempted a stand several times with +2, but pt could not push wt through her legs.  Moved back into supine for self care with an emphasis on using her RUE as much as possible and  working on bed mobility skills.  Pt demonstrating some mild apraxia with using her RUE.  During tx, pt's LUE "locked out" as if she had extensor tone.  With MMT, pt able to move arm against gravity.  Will continue to assess LUE motor patterns.  B/D completed and pt adjusted in bed with pillows for support under her arms.  Pt resting in bed with all needs met.  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 Level of Independence: Independent with basic ADLs, Independent with homemaking with ambulation, Independent with gait, Independent with transfers  Able to Take Stairs?: Yes Vocation: Full time employment Leisure: Hobbies-yes (Comment) Comments: Works as Retail buyer and at [employer] ADL ADL ADL Comments: refer functional navigator Vision Baseline Vision/History: No visual deficits Patient Visual Report: Blurring of vision Vision Assessment?: Yes Eye Alignment: Within Functional Limits Ocular Range of Motion: Restricted on the left Alignment/Gaze Preference: Within Defined Limits Tracking/Visual Pursuits: Left eye does not track laterally;Requires cues, head turns, or add eye shifts to track Visual Fields: Left visual field deficit Perception  Perception: Impaired Inattention/Neglect: Does not attend to left visual field Praxis Praxis: Impaired Praxis Impairment Details: Motor  planning Cognition Overall Cognitive Status: Impaired/Different from baseline Arousal/Alertness: Awake/alert Orientation Level: Person;Place;Situation Person: Oriented Place: Oriented Situation: Oriented Year: 2018 Month: June Day of Week: Correct Memory: Impaired Memory Impairment: Decreased recall of new information Immediate Memory Recall: Sock;Blue;Bed Memory Recall: Sock;Blue;Bed Memory Recall Sock: Without Cue Memory Recall Blue: With Cue Memory Recall Bed: With Cue (2  cues) Problem Solving: Impaired Problem Solving Impairment: Verbal basic;Functional basic Safety/Judgment: Appears intact Sensation Sensation Light Touch: Impaired by gross assessment Stereognosis: Impaired by gross assessment Hot/Cold: Not tested Proprioception: Impaired by gross assessment Coordination Gross Motor Movements are Fluid and Coordinated: No Fine Motor Movements are Fluid and Coordinated: No Coordination and Movement Description: RUE has full AROM, but movement pattern is slow and irregular Motor  Motor Motor: Tetraplegia Mobility    max A with bed mobility, unable to stand even with total A of 2.  Trunk/Postural Assessment  Cervical Assessment Cervical Assessment: Exceptions to [facility] (forward head tilt in sitting) Thoracic Assessment Thoracic Assessment: Exceptions to [facility] (kyphotic posture, max A to extend upright) Lumbar Assessment Lumbar Assessment: Exceptions to [facility] (posterior tilt) Postural Control Postural Control: Deficits on evaluation (posterior lean with fatigue)  Balance Static Sitting Balance Static Sitting - Level of Assistance: 4: Min assist Dynamic Sitting Balance Dynamic Sitting - Level of Assistance: 2: Max assist Static Standing Balance Static Standing - Level of Assistance: Not tested (comment) (attempted several times with +2) Extremity/Trunk Assessment RUE Assessment RUE Assessment: Exceptions to [facility] RUE AROM (degrees) RUE Overall AROM Comments: WFL RUE  Strength RUE Overall Strength Comments: 3+/5 shoulder, 4-/5 elbow and grasp LUE Assessment LUE Assessment: Exceptions to WFL LUE AROM (degrees) LUE Overall AROM Comments: sh flex to 30 degrees, 80% of range with elbow flex and ext, full grasp LUE Strength LUE Overall Strength Comments: shoulder 2-/5, elbow 3-/5, grasp 3/5   See Function Navigator for Current Functional Status.   Refer to Care [name] for Long Term Goals  Recommendations for other services: None    Discharge Criteria: Patient will be discharged from OT if patient refuses treatment 3 consecutive times without medical reason, if treatment goals not met, if there is a change in medical status, if patient makes no progress towards goals or if patient is discharged from hospital.  The above assessment, treatment [name], treatment alternatives and goals were discussed and mutually agreed upon: by patient  [place] 01/08/2017, 1:16 PM

## 2017-01-08 NOTE — Progress Notes (Signed)
RN notified Marlowe Shores, PA with DVT finding to LLE. No new orders at this time.

## 2017-01-08 NOTE — Progress Notes (Signed)
Patient information reviewed and entered into eRehab system by Abbigaile Rockman, RN, CRRN, PPS Coordinator.  Information including medical coding and functional independence measure will be reviewed and updated through discharge.    

## 2017-01-08 NOTE — Progress Notes (Signed)
*  PRELIMINARY RESULTS* Vascular Ultrasound Bilateral lower extremity venous duplex has been completed.  Preliminary findings: No evidence of deep vein thrombosis involving the visualized veins of the right lower extremity.  The left gastrocnemius veins of the calf muscle demonstrate acute thrombus, other visualized veins of the left lower extremity appear patent.  Preliminary resutls given to [facility] @ 16:50 Everrett Coombe 01/08/2017, 4:57 PM

## 2017-01-08 NOTE — Progress Notes (Signed)
Patient in and out cathed q6 hours during night shift. At 2000 bladder scanned for 522cc cathed for 500 cc. At 3am, bladder scanned for 500 cc cathed for 650 cc.

## 2017-01-08 NOTE — Plan of Care (Signed)
Problem: RH BLADDER ELIMINATION Goal: RH STG MANAGE BLADDER WITH ASSISTANCE STG Manage Bladder With min Assistance  Outcome: Not Progressing Requires in and out cath.

## 2017-01-08 NOTE — Plan of Care (Signed)
Problem: RH BLADDER ELIMINATION Goal: RH STG MANAGE BLADDER WITH ASSISTANCE STG Manage Bladder With min Assistance  Outcome: Not Progressing requiring I&O cath for now

## 2017-01-09 ENCOUNTER — Inpatient Hospital Stay (HOSPITAL_COMMUNITY): Payer: Self-pay | Admitting: Speech Pathology

## 2017-01-09 ENCOUNTER — Inpatient Hospital Stay (HOSPITAL_COMMUNITY): Payer: Self-pay | Admitting: Physical Therapy

## 2017-01-09 ENCOUNTER — Inpatient Hospital Stay (HOSPITAL_COMMUNITY): Payer: Self-pay | Admitting: *Deleted

## 2017-01-09 DIAGNOSIS — N390 Urinary tract infection, site not specified: Secondary | ICD-10-CM

## 2017-01-09 DIAGNOSIS — B9620 Unspecified Escherichia coli [E. coli] as the cause of diseases classified elsewhere: Secondary | ICD-10-CM

## 2017-01-09 LAB — GLUCOSE, CAPILLARY
Glucose-Capillary: 119 mg/dL — ABNORMAL HIGH (ref 65–99)
Glucose-Capillary: 122 mg/dL — ABNORMAL HIGH (ref 65–99)
Glucose-Capillary: 129 mg/dL — ABNORMAL HIGH (ref 65–99)
Glucose-Capillary: 137 mg/dL — ABNORMAL HIGH (ref 65–99)
Glucose-Capillary: 142 mg/dL — ABNORMAL HIGH (ref 65–99)
Glucose-Capillary: 163 mg/dL — ABNORMAL HIGH (ref 65–99)

## 2017-01-09 MED ORDER — INSULIN ASPART 100 UNIT/ML ~~LOC~~ SOLN
0.0000 [IU] | Freq: Three times a day (TID) | SUBCUTANEOUS | Status: DC
  Administered 2017-01-09 – 2017-01-10 (×2): 3 [IU] via SUBCUTANEOUS
  Administered 2017-01-10: 4 [IU] via SUBCUTANEOUS
  Administered 2017-01-10 – 2017-01-13 (×6): 3 [IU] via SUBCUTANEOUS
  Administered 2017-01-16: 4 [IU] via SUBCUTANEOUS
  Administered 2017-01-16 – 2017-01-17 (×2): 3 [IU] via SUBCUTANEOUS
  Administered 2017-01-18 – 2017-01-20 (×2): 4 [IU] via SUBCUTANEOUS
  Administered 2017-01-23 – 2017-02-04 (×3): 3 [IU] via SUBCUTANEOUS

## 2017-01-09 MED ORDER — CEPHALEXIN 250 MG PO CAPS
500.0000 mg | ORAL_CAPSULE | Freq: Three times a day (TID) | ORAL | Status: DC
  Administered 2017-01-09 – 2017-01-10 (×3): 500 mg via ORAL
  Filled 2017-01-09 (×3): qty 2

## 2017-01-09 MED ORDER — DEXAMETHASONE 4 MG PO TABS
10.0000 mg | ORAL_TABLET | Freq: Three times a day (TID) | ORAL | Status: DC
  Administered 2017-01-09 – 2017-01-11 (×6): 10 mg via ORAL
  Filled 2017-01-09 (×6): qty 1

## 2017-01-09 NOTE — Progress Notes (Signed)
[facility] PHYSICAL MEDICINE & REHABILITATION     PROGRESS NOTE    Subjective/Complaints: No new issues. Pain controlled. Urine with odor.   ROS: pt denies nausea, vomiting, diarrhea, cough, shortness of breath or chest pain    Objective: Vital Signs: Blood pressure 136/84, pulse 80, temperature 98.5 F (36.9 C), temperature source Oral, resp. rate 17, weight 89.9 kg (198 lb 4.1 oz), last menstrual period 12/31/2016, SpO2 99 %. No results found.  Recent Labs  01/08/17 0546  WBC 22.5*  HGB 15.1*  HCT 45.5  PLT 302    Recent Labs  01/08/17 0546  NA 135  K 4.5  CL 101  GLUCOSE 126*  BUN 22*  CREATININE 0.60  CALCIUM 8.7*   CBG (last 3)   Recent Labs  01/09/17 0016 01/09/17 0421 01/09/17 0745  GLUCAP 129* 122* 137*    Wt Readings from Last 3 Encounters:  01/08/17 89.9 kg (198 lb 4.1 oz)  01/07/17 88.9 kg (196 lb)    Physical Exam:  Constitutional: She appears well-developed.  No distress HENT:  Head: Normocephalic and atraumatic.  Eyes: EOM are normal. Right eye exhibits no discharge. Left eye exhibits no discharge.  Neck: Normal range of motion. Neck supple. No thyromegaly present.  Cardiovascular: RRR    Respiratory: CTA B GI: Soft. Bowel sounds are normal. She exhibits no distension.  Musculoskeletal: She exhibits no edema or tenderness.  Neurological:   oriented and alert  Motor: RUE: 4+/5 proximal to distal LUE: Shoulder abduction 2+ to 3-/5, elbow flex/ext 2+/5, hand grip 4-/5 RLE: 3-/5 right hip flexion, KE, 4/5 ADF/PF LLE: 1-2/5 proximal to distal  Skin: Skin is warm and dry.  Psychiatric: She has a normal mood and affect. Her behavior is normal.    Assessment/Plan: 1. 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 Physiatrist is providing close team supervision and 24 hour management of  active medical problems listed below. Physiatrist and rehab team continue to assess barriers to discharge/monitor patient progress toward functional and medical goals.  Function:  Bathing Bathing position   Position: Bed  Bathing parts Body parts bathed by patient: Chest, Abdomen Body parts bathed by helper: Right arm, Left arm, Front perineal area, Buttocks, Right upper leg, Left upper leg, Right lower leg, Left lower leg, Back  Bathing assist Assist Level: 2 helpers      Upper Body Dressing/Undressing Upper body dressing   What is the patient wearing?: Bra, Pull over shirt/dress   Bra - Perfomed by helper: Thread/unthread right bra strap, Thread/unthread left bra strap, Hook/unhook bra (pull down sports bra)   Pull over shirt/dress - Perfomed by helper: Thread/unthread right sleeve, Thread/unthread left sleeve, Put head through opening, Pull shirt over trunk        Upper body assist Assist Level: 2 helpers      Lower Body Dressing/Undressing Lower body dressing   What is the patient wearing?: Pants, Non-skid slipper socks       Pants- Performed by helper: Thread/unthread right pants leg, Thread/unthread left pants leg, Pull pants up/down   Non-skid slipper socks- Performed by helper: Don/doff right sock, Don/doff left sock                  Lower body assist Assist for lower body dressing: 2 Helpers      Toileting Toileting Toileting activity did not occur: N/A  Toileting assist     Transfers Chair/bed transfer   Chair/bed transfer method: Other Chair/bed transfer assist level: 2 helpers Chair/bed transfer assistive device: Mechanical lift Mechanical lift: Maximove   Locomotion Ambulation Ambulation activity did not occur: Safety/medical concerns (quadriplegia)         Wheelchair   Type:  (TBD)   Assist Level: Dependent (Pt equals 0%) (TIS)  Cognition Comprehension Comprehension assist level: Follows basic conversation/direction with no  assist  Expression Expression assist level: Expresses basic 50 - 74% of the time/requires cueing 25 - 49% of the time. Needs to repeat parts of sentences.  Social Interaction Social Interaction assist level: Interacts appropriately with others with medication or extra time (anti-anxiety, antidepressant).  Problem Solving Problem solving assist level: Solves basic problems with no assist  Memory Memory assist level: Recognizes or recalls 75 - 89% of the time/requires cueing 10 - 24% of the time   Medical Problem List and Plan: 1.  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  DVT Prophylaxis/Anticoagulation: SCDs.  -left gastroc thrombus---mobilize, observe only for now---recheck next week 3. Pain Management: Ultram as needed 4. Mood: Provide emotional support 5. Neuropsych: This patient is capable of making decisions on her own behalf. 6. Skin/Wound Care: Routine skin checks 7. Fluids/Electrolytes/Nutrition: Routine I&O with follow-up chemistries all reviewed personally today  -encourage PO 8. Seizure prophylaxis. Keppra 500 mg every 12 hours 9. Hypertension.Nimotop as per protocol21 days. Currently day 14 of 21 10. Diabetes mellitus.SSI. Monitor closely while on Decadron. Patient on Glucophage 500 mg daily prior to admission. Resume as needed 11. Obesity. BMI 34.7 KG. Dietary follow-up 12. Leukyctosis: likely reactive due to steroids  -begin wean of steroids  -urine with odor, urine culture with 100k e coli---begin keflex  LOS (Days) 2 A FACE TO FACE EVALUATION WAS PERFORMED  Alger Simons T, MD 01/09/2017 9:01 AM

## 2017-01-09 NOTE — Progress Notes (Signed)
Speech Language Pathology Daily Session Note  Patient Details  Name: Aimee [name] MRN: [id number] Date of Birth: 1976-05-20  Today's Date: 01/09/2017 SLP Individual Time: 1307-1400 SLP Individual Time Calculation (min): 53 min  Short Term Goals: Week 1: SLP Short Term Goal 1 (Week 1): Patient will recall new information related to her care with Min question cues.  SLP Short Term Goal 2 (Week 1): Patient will solve complex problems with Min assist question cues to recognize and correct errors. SLP Short Term Goal 3 (Week 1): Patient will consume trials of ice chips with minimal overt s/s of aspiration to determine readiness for advancement or objective swallow assessment  SLP Short Term Goal 4 (Week 1): Patient will perform EMST exercises at 45 cm H2O for 5 sets of 5 reps with a self-perceived effort level of 8/10 with Min verbal cues for accuracy with use of device. SLP Short Term Goal 5 (Week 1): Patient will achieve phonation at the word level in structured tasks with Mod assist verbal cues  Skilled Therapeutic Interventions: Skilled treatment session focused on addressing speech and swallowing goals. Patient participated in RMT assessment. Patient's MEP was 62 cm H2O which is below the LLN of 71.63 cm H2O.  SLP facilitated session by providing Min A verbal and visual cues for accuracy with use of EMST device trainer. Patient performed 5 sets of 5 reps with trainer set at 45 cm H2O with a self-perceived effort level of 7-8/10.  Patient completed math calculations with provided formula to calculate MEP with Min assist verbal cues for accuracy.  Patient also required Max assist verbal cues to initiate phonation during a structured naming task at the word level.  Continue with current [name] of care.    Function:  Eating Eating   Modified Consistency Diet: Yes Eating Assist Level: Set up assist for;Supervision or verbal cues   Eating Set Up Assist For: Opening containers;Cutting food        Cognition Comprehension Comprehension assist level: Follows basic conversation/direction with no assist  Expression   Expression assist level: Expresses basic 50 - 74% of the time/requires cueing 25 - 49% of the time. Needs to repeat parts of sentences.  Social Interaction Social Interaction assist level: Interacts appropriately with others with medication or extra time (anti-anxiety, antidepressant).  Problem Solving Problem solving assist level: Solves basic problems with no assist  Memory Memory assist level: Recognizes or recalls 90% of the time/requires cueing < 10% of the time    Pain Pain Assessment Pain Assessment: No/denies pain  Therapy/Group: Individual Therapy  Carmelia Roller., [place] [phone]  [place] 01/09/2017, 9:13 PM

## 2017-01-09 NOTE — Progress Notes (Signed)
Occupational Therapy Session Note  Patient Details  Name: Aimee [name] MRN: [id number] Date of Birth: Apr 26, 1976  Today's Date: 01/09/2017 OT Individual Time: 1000-1100 OT Individual Time Calculation (min): 60 min    Short Term Goals: Week 1:  OT Short Term Goal 1 (Week 1): Pt will be able to roll in bed with min A for LB self care. OT Short Term Goal 2 (Week 1): Pt will be able to sit to EOB with min A to prepare for UB self care. OT Short Term Goal 3 (Week 1): Pt will maintain dynamic sitting with min A during UB self care. OT Short Term Goal 4 (Week 1): Pt will bathe UB with min A. OT Short Term Goal 5 (Week 1): Pt will don shirt with mod A.  Skilled Therapeutic Interventions/Progress Updates:    Pt resting in TIS w/c upon arrival.  Initially engaged in brushing hair with RUE.  Pt required assistance to complete task.  Pt dressed from earlier session and denied need to change pants.  Pt initially engaged in sit<>stand with Stedy and standing activities while in [place] ([employer]).  Pt required mod A for sit<>stand with Stedy.  Pt demonstrated standing in Stedy for approx 1 min before sitting on Stedy seat.  Pt required support at knees/LE while standing.  Pt attempted to engaged LUE in task.  Pt noted with weakness (proximal>distal). Pt required mod A for stand>sit in w/c.  Pt remained in w/c with half lap tray in place and fan provided as pt c/o being hot.    Therapy Documentation Precautions:  Precautions Precautions: Fall, Other (comment) Precaution Comments: quadriplegia (LUE/LLE more impaired) PRAFOs Restrictions Weight Bearing Restrictions: No RLE Weight Bearing: Weight bearing as tolerated LLE Weight Bearing: Weight bearing as tolerated Pain: Pain Assessment Pain Assessment: No/denies pain Pain Score: 0-No pain  See Function Navigator for Current Functional Status.   Therapy/Group: Individual Therapy  Leroy Libman 01/09/2017, 12:14 PM

## 2017-01-09 NOTE — Progress Notes (Signed)
Physical Therapy Session Note  Patient Details  Name: Aimee [name] MRN: [id number] Date of Birth: [birth year]  Today's Date: 01/09/2017 PT Individual Time: 0800-0915 PT Individual Time Calculation (min): 75 min   Short Term Goals: Week 1:  PT Short Term Goal 1 (Week 1): Pt will be able to initiate propped on elbow <> sit with mod assist PT Short Term Goal 2 (Week 1): Pt will be able to perform slideboard transfer with +2 assist but pt directing steps of transfer with min verbal cues PT Short Term Goal 3 (Week 1): Pt will be able to verbalize pressure relief schedule while OOB PT Short Term Goal 4 (Week 1): Pt will be able to perform functional sitting balance with min assist x 5 min   Skilled Therapeutic Interventions/Progress Updates: Pt received semi-reclined in bed, denies pain and agreeable to treatment. Vitals as below at rest. Rolling R modA, rolling L min A for changing brief and donning pants totalA. Transfer bed>w/c totalA in maxi move. 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 Sitting balance close S/min guard overall with occasional L posterolateral lean, and R anterolateral lean, corrected with mirror visual feedback. Sit <>stand with stedy and modA +2 for stabilizing LUE on stedy and boost to stand, modA for eccentric control to sit. During first trial pt reported dizziness and diaphoretic in standing; returned to sitting and vitals assessed 126/62, with symptoms resolving in <2 min. Sit >supine maxA for BLE management and cues for technique and motor planning. Rolling L with min guard, cues for LE placement. Bridging with modA to scoot hips to L, assist for managing LEs in midline, keeping feet flat on mat. Rolling R minA with setup for LLE placement and facilitating LUE reaching over body. R sidelying >sit x5 reps from elbow for UE coordination and  core activation, maxA faded to minA. Sit >stand in stedy modA +2 to return to w/c. Attempted LLE activation with ball kicks, manual facilitation tapping at quads however unable to demonstrate AROM knee extension. Returned to room totalA; remained semi-reclined in w/c with L lap tray donned for comfort, all needs in reach.       Therapy Documentation Precautions:  Precautions Precautions: Fall, Other (comment) Precaution Comments: quadriplegia (LUE/LLE more impaired) PRAFOs Restrictions Weight Bearing Restrictions: No RLE Weight Bearing: Weight bearing as tolerated LLE Weight Bearing: Weight bearing as tolerated Vital Signs: Therapy Vitals Pulse Rate: 80 BP: 136/87 Patient Position (if appropriate): Sitting   See Function Navigator for Current Functional Status.   Therapy/Group: Individual Therapy  Luberta Mutter 01/09/2017, 9:35 AM

## 2017-01-09 NOTE — Plan of Care (Signed)
Problem: RH BLADDER ELIMINATION Goal: RH STG MANAGE BLADDER WITH ASSISTANCE STG Manage Bladder With min Assistance  Outcome: Not Progressing Incontinent at times.  Wears brief- total assist

## 2017-01-09 NOTE — Patient Care Conference (Signed)
Inpatient RehabilitationTeam Conference and [name] of Care Update Date: 01/08/2017   Time: 2:10 PM    Patient Name: Aimee [name]      Medical Record Number: [id number]  Date of Birth: [birth year] Sex: Female         Room/Bed: 4W12C/4W12C-01 Payor Info: Payor: /    Admitting Diagnosis: SAH sp [place] Multiple B CVA  Admit Date/Time:  01/07/2017  6:56 PM Admission Comments: No comment available   Primary Diagnosis:  <principal problem not specified> Principal Problem: <principal problem not specified>  Patient Active Problem List   Diagnosis Date Noted  . Seizure prophylaxis   . Quadriparesis ([place])   . Class 1 obesity due to excess calories with serious comorbidity and body mass index (BMI) of 30.0 to 30.9 in adult   . Benign essential HTN   . Diabetes mellitus type 2 in obese ([place])   . Leukocytosis   . Acute encephalopathy   . Somnolence   . Seizure ([place])   . Stupor   . Ventilator dependent ([place])   . SAH (subarachnoid hemorrhage) ([place]) 12/25/2016  . 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 Alger [name] Social Worker Present: Aimee Pall, LCSW Nurse Present: Aimee Creamer, RN PT Present: Aimee [name], Aimee [name], PT OT Present: Aimee [name], Aimee [name], OT SLP Present: Aimee [name], SLP PPS Coordinator present : Aimee Nakayama, RN, CRRN     Current Status/Progress Goal Weekly Team Focus  Medical   p-com aneurysm with tetraplegia, DM, HTN  improve functional mobilty  bp control, skin, nutrition   Bowel/Bladder   Incontinent of bladder, requested for bedpan and voided about 100cc, bladder scanned 522 CC and in and out cathed for 500 cc. Incontinent of bowel,last BM 6/5.  Patient to be able to regain continence for both bowel and bladder.  Monitor for bladder and bowel function. Up to [facility] q 2-4 hours prn.    Swallow/Nutrition/ Hydration   Dys.3 textures and nectar-thick liquids   least restrictive PO with Mod I   carryover of safe swallow strategies; trials of advanced textures and consistencies    ADL's   max A/tot A bathing/dressing; tot A +2 with mechanical lift for transfers, max A dynamic sitting balance; BUE weakness R>L  min A/mod A overall  BADL retraining, sitting balance, functional transfers, activity tolerance   Mobility   total A +2 using lift equipment for transfers and initiating slideboard transfers; min to max assist dynamic sitting balance  mod assist overall w/c level  transfers, neuro re-ed for postural control, balance and coordination, strengthening, education, OOB tolerance, and bed mobility   Communication   Mod assist   Supervision   increase voicing and vocal intensity    Safety/Cognition/ Behavioral Observations  Min A with complex/high level tasks  Mod I   self-monitoring and correcting of errors; use of retreival strategies with recall    Pain   Denies pain.  Pain less than or equal to 2.  Monitor for pain and medicate prn.   Skin   Bruising bilateral upper and lower extremities, redness to groin area otherwise skin is intact.  No skin breakdown while in rehab.   Monitor skin q shift and prn. MCP applied to Groin area.    Rehab Goals Patient on target to meet rehab goals: Yes *See Care [name] and progress notes for long and short-term goals.  Barriers to Discharge:  substantial weakness    Possible Resolutions to Barriers:  adaptive equipment and education, NMR    Discharge Planning/Teaching Needs:  [name] to d/c home with family providing 24/7 assistance.  [name] to go to sister-in-law's home which is one level and accessible for w/c.  Teaching to be scheduled when appropriate to begin.   Team Discussion:  New eval;  Max assist to sit EOB; apraxia and extensor tone present.  Setting min/mod assist goals overall.  Poor motor planning.  Left side significantly  worse than right.  Cont of bladder but with retention.  Currently on D3, nectar.  Cognition good overall - higher level issues.  Revisions to Treatment [name]:  None   Continued Need for Acute Rehabilitation Level of Care: The patient requires daily medical management by a physician with specialized training in physical medicine and rehabilitation for the following conditions: Daily direction of a multidisciplinary physical rehabilitation program to ensure safe treatment while eliciting the highest outcome that is of practical value to the patient.: Yes Daily medical management of patient stability for increased activity during participation in an intensive rehabilitation regime.: Yes Daily analysis of laboratory values and/or radiology reports with any subsequent need for medication adjustment of medical intervention for : Neurological problems;Blood pressure problems  Aimee [name] 01/09/2017, 2:23 PM

## 2017-01-10 ENCOUNTER — Inpatient Hospital Stay (HOSPITAL_COMMUNITY): Payer: Self-pay

## 2017-01-10 ENCOUNTER — Inpatient Hospital Stay (HOSPITAL_COMMUNITY): Payer: Self-pay | Admitting: Physical Therapy

## 2017-01-10 ENCOUNTER — Encounter (HOSPITAL_COMMUNITY): Payer: Self-pay | Admitting: Nurse Practitioner

## 2017-01-10 ENCOUNTER — Inpatient Hospital Stay (HOSPITAL_COMMUNITY): Payer: Self-pay | Admitting: Occupational Therapy

## 2017-01-10 ENCOUNTER — Inpatient Hospital Stay (HOSPITAL_COMMUNITY): Payer: Self-pay | Admitting: Speech Pathology

## 2017-01-10 DIAGNOSIS — A499 Bacterial infection, unspecified: Secondary | ICD-10-CM

## 2017-01-10 LAB — GLUCOSE, CAPILLARY
Glucose-Capillary: 121 mg/dL — ABNORMAL HIGH (ref 65–99)
Glucose-Capillary: 135 mg/dL — ABNORMAL HIGH (ref 65–99)
Glucose-Capillary: 172 mg/dL — ABNORMAL HIGH (ref 65–99)
Glucose-Capillary: 193 mg/dL — ABNORMAL HIGH (ref 65–99)

## 2017-01-10 LAB — URINE CULTURE: Culture: >=100000 — AB

## 2017-01-10 MED ORDER — NITROFURANTOIN MONOHYD MACRO 100 MG PO CAPS
100.0000 mg | ORAL_CAPSULE | Freq: Two times a day (BID) | ORAL | Status: AC
  Administered 2017-01-10 – 2017-01-16 (×14): 100 mg via ORAL
  Filled 2017-01-10 (×15): qty 1

## 2017-01-10 NOTE — Care Management Note (Signed)
[facility] Individual Statement of Services  Patient Name:  [name] Plan  Date:  01/10/2017  Welcome to the [facility].  Our goal is to provide you with an individualized program based on your diagnosis and situation, designed to meet your specific needs.  With this comprehensive rehabilitation program, you will be expected to participate in at least 3 hours of rehabilitation therapies Monday-Friday, with modified therapy programming on the weekends.  Your rehabilitation program will include the following services:  Physical Therapy (PT), Occupational Therapy (OT), Speech Therapy (ST), 24 hour per day rehabilitation nursing, Therapeutic Recreaction (TR), Neuropsychology, Case Management (Social Worker), Rehabilitation Medicine, Nutrition Services and Pharmacy Services  Weekly team conferences will be held on Tuesdays to discuss your progress.  Your Social Worker will talk with you frequently to get your input and to update you on team discussions.  Team conferences with you and your family in attendance may also be held.  Expected length of stay: 4 weeks Overall anticipated outcome: moderate assist  Depending on your progress and recovery, your program may change. Your Social Worker will coordinate services and will keep you informed of any changes. Your Social Worker's name and contact numbers are listed  below.  The following services may also be recommended but are not provided by the [facility] will be made to provide these services after discharge if needed.  Arrangements include referral to agencies that provide these services.  Your insurance has been verified to be:  None Your primary doctor is:  None (Education officer, museum will assist with referral to [facility])  Pertinent information will be shared with your doctor and your insurance company.  Social Worker:  [place], [place] or ([phone]   Information discussed with and copy given to patient by: Lennart Pall, 01/10/2017, 1:54 PM

## 2017-01-10 NOTE — Progress Notes (Signed)
Physical Therapy Session Note  Patient Details  Name: Aimee [name] MRN: [id number] Date of Birth: [birth year]  Today's Date: 01/10/2017 PT Individual Time: 1100-1200 and 6803-2122 PT Individual Time Calculation (min): 60 min and 30 min (total 90 min)   Short Term Goals: Week 1:  PT Short Term Goal 1 (Week 1): Pt will be able to initiate propped on elbow <> sit with mod assist PT Short Term Goal 2 (Week 1): Pt will be able to perform slideboard transfer with +2 assist but pt directing steps of transfer with min verbal cues PT Short Term Goal 3 (Week 1): Pt will be able to verbalize pressure relief schedule while OOB PT Short Term Goal 4 (Week 1): Pt will be able to perform functional sitting balance with min assist x 5 min   Skilled Therapeutic Interventions/Progress Updates: Tx 1: Pt received seated in w/c, denies pain and agreeable to treatment. Sit >stand in hall with R rail and maxA x3 trials; exchanged tilt in space w/c for hemi-height w/c to allow for sit <>stand d/t short leg length; recommend keeping tilt in space chair in room when sitting up for prolonged periods, and manual w/c for propulsion and sit <>stand in therapy. Pt with difficulty coming to full stand at rail d/t apraxia, L inattention and neuromuscular and strength deficits; mirror visual feedback did not improve quality of stand, and pt with L lateral lean, slight pushing with RUE. Sit<>stand in stedy with maxA from low w/c, min guard from stedy seat. Multimodal cues for RLE eccentric control and reduced recurvatum in stance/buckling with lowering to seat. Sitting balance with S while on edge of mat, engaged in LUE forced use activity retrieving and stacking numbered targets, RUE placed under RLE, then transitioned to behind back due to pt tendency to unconsciously use RUE to assist L. Educated pt on importance of forced use and allowing LUE to perform activities even if it takes longer to allow for neuromotor recovery.  Transferred back to tilt in space w/c totalA in stedy.  Returned to room totalA; remained seated in w/c at end of session, slightly reclined. All needs in reach.   Tx 2: Pt received supine in bed, denies pain and agreeable to treatment. Rolling to L with bedrails and S. L sidelying>sit with maxA and bedrails with HOB elevated. Bed deflated once in sitting position. Sit >stand modA from slightly elevated bed with stedy. Alerted RN and NT to updated safety [name] to reflect recommendation that pt use stedy for transfers for forced use of extremities and strengthening. W/c propulsion attempted with R hemi technique however even in hemi-height chair pt unable to reach floor with RLE. Propelled with BUE x150' total; maxA initially for hand-over-hand cueing for placement of LUE and technique, however improved to minA with repetition and increased attention to L side. Remained seated in w/c at end of session, all needs in reach and recommended pt stay up in chair no more than 30 min, pt agreeable.      Therapy Documentation Precautions:  Precautions Precautions: Fall, Other (comment) Precaution Comments: quadriplegia (LUE/LLE more impaired) PRAFOs Restrictions Weight Bearing Restrictions: No RLE Weight Bearing: Weight bearing as tolerated LLE Weight Bearing: Weight bearing as tolerated   See Function Navigator for Current Functional Status.   Therapy/Group: Individual Therapy  Luberta Mutter 01/10/2017, 3:33 PM

## 2017-01-10 NOTE — Progress Notes (Signed)
PHYSICAL MEDICINE & REHABILITATION     PROGRESS NOTE    Subjective/Complaints: Feeling well. Good appetite. Happy with progress  ROS: pt denies nausea, vomiting, diarrhea, cough, shortness of breath or chest pain     Objective: Vital Signs: Blood pressure (!) 134/96, pulse 76, temperature 98 F (36.7 C), temperature source Oral, resp. rate 18, weight 89.9 kg (198 lb 4.1 oz), last menstrual period 12/31/2016, SpO2 100 %. No results found.  Recent Labs  01/08/17 0546  WBC 22.5*  HGB 15.1*  HCT 45.5  PLT 302    Recent Labs  01/08/17 0546  NA 135  K 4.5  CL 101  GLUCOSE 126*  BUN 22*  CREATININE 0.60  CALCIUM 8.7*   CBG (last 3)   Recent Labs  01/09/17 1646 01/09/17 2047 01/10/17 0636  GLUCAP 119* 163* 121*    Wt Readings from Last 3 Encounters:  01/08/17 89.9 kg (198 lb 4.1 oz)  01/07/17 88.9 kg (196 lb)    Physical Exam:  Constitutional: She appears well-developed.  No distress HENT:  Head: Normocephalic and atraumatic.  Eyes: EOM are normal. Right eye exhibits no discharge. Left eye exhibits no discharge.  Neck: Normal range of motion. Neck supple. No thyromegaly present.  Cardiovascular: RRR    Respiratory: CTA B GI: Soft. Bowel sounds are normal. She exhibits no distension.  Musculoskeletal: She exhibits no edema or tenderness.  Neurological:   oriented  Motor: RUE: 4+/5 proximal to distal LUE: Shoulder abduction 2+ to 3-/5, elbow flex/ext 2+/5, hand grip 4-/5 RLE: 3-/5 right hip flexion, KE, 4/5 ADF/PF LLE: 1-2/5 proximal to distal  Skin: Skin is warm and dry.  Psychiatric: She has a normal mood and affect. Her behavior is normal.    Assessment/Plan: 1. 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 Physiatrist is providing close team supervision and 24 hour management of active  medical problems listed below. Physiatrist and rehab team continue to assess barriers to discharge/monitor patient progress toward functional and medical goals.  Function:  Bathing Bathing position   Position: Bed  Bathing parts Body parts bathed by patient: Chest, Abdomen Body parts bathed by helper: Right arm, Left arm, Front perineal area, Buttocks, Right upper leg, Left upper leg, Right lower leg, Left lower leg, Back  Bathing assist Assist Level: 2 helpers      Upper Body Dressing/Undressing Upper body dressing   What is the patient wearing?: Bra, Pull over shirt/dress   Bra - Perfomed by helper: Thread/unthread right bra strap, Thread/unthread left bra strap, Hook/unhook bra (pull down sports bra)   Pull over shirt/dress - Perfomed by helper: Thread/unthread right sleeve, Thread/unthread left sleeve, Put head through opening, Pull shirt over trunk        Upper body assist Assist Level: 2 helpers      Lower Body Dressing/Undressing Lower body dressing   What is the patient wearing?: Pants, Non-skid slipper socks       Pants- Performed by helper: Thread/unthread right pants leg, Thread/unthread left pants leg, Pull pants up/down   Non-skid slipper socks- Performed by helper: Don/doff right sock, Don/doff left sock                  Lower body assist Assist for lower body dressing: 2 Helpers      Toileting Toileting Toileting activity did not occur: N/A        Toileting  assist     Transfers Chair/bed transfer   Chair/bed transfer method: Lateral scoot Chair/bed transfer assist level: Maximal assist (Pt 25 - 49%/lift and lower) Chair/bed transfer assistive device: Sliding board, Armrests Mechanical lift: Maximove   Locomotion Ambulation Ambulation activity did not occur: Safety/medical concerns (quadriplegia)         Wheelchair   Type:  (TBD)   Assist Level: Dependent (Pt equals 0%) (TIS)  Cognition Comprehension Comprehension assist level:  Follows basic conversation/direction with no assist  Expression Expression assist level: Expresses basic 50 - 74% of the time/requires cueing 25 - 49% of the time. Needs to repeat parts of sentences.  Social Interaction Social Interaction assist level: Interacts appropriately with others with medication or extra time (anti-anxiety, antidepressant).  Problem Solving Problem solving assist level: Solves basic problems with no assist  Memory Memory assist level: Recognizes or recalls 90% of the time/requires cueing < 10% of the time   Medical Problem List and Plan: 1.  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  DVT Prophylaxis/Anticoagulation: SCDs.  -left gastroc thrombus---mobilize, observe only for now---recheck prior to discharge 3. Pain Management: Ultram as needed 4. Mood: Provide emotional support 5. Neuropsych: This patient is capable of making decisions on her own behalf. 6. Skin/Wound Care: Routine skin checks 7. Fluids/Electrolytes/Nutrition: Routine I&O with follow-up chemistries all reviewed personally today  -encourage PO 8. Seizure prophylaxis. Keppra 500 mg every 12 hours 9. Hypertension.Nimotop as per protocol21 days.   10. Diabetes mellitus.SSI. Monitor closely while on Decadron. Patient on Glucophage 500 mg daily prior to admission. Resume as needed  -sugars under reasonable control 11. Obesity. BMI 34.7 KG. Dietary follow-up 12. Leukyctosis:    -weaning steroids  -urine with odor, urine culture with 100k e coli---res to keflex, begin macrobid  LOS (Days) 3 A FACE TO FACE EVALUATION WAS PERFORMED  Alger Simons T, MD 01/10/2017 8:41 AM

## 2017-01-10 NOTE — Progress Notes (Signed)
Occupational Therapy Session Note  Patient Details  Name: Aimee [name] Plan MRN: [id number] Date of Birth: [birth year]  Today's Date: 01/10/2017 OT Individual Time: 0700-0800 OT Individual Time Calculation (min): 60 min    Short Term Goals: Week 1:  OT Short Term Goal 1 (Week 1): Pt will be able to roll in bed with min A for LB self care. OT Short Term Goal 2 (Week 1): Pt will be able to sit to EOB with min A to prepare for UB self care. OT Short Term Goal 3 (Week 1): Pt will maintain dynamic sitting with min A during UB self care. OT Short Term Goal 4 (Week 1): Pt will bathe UB with min A. OT Short Term Goal 5 (Week 1): Pt will don shirt with mod A.  Skilled Therapeutic Interventions/Progress Updates:    Pt engaged in BADL retraining including bathing/dressing with sit<>stand from w/c with Stedy.  Pt resting in bed upon arrival.  Pt required max A for supine>sit EOB and max A for sit<>stand with Stedy.   Pt initiates use of LUE for bathing/dressing tasks but required [facility] assistance to complete task.  Pt required assistance with pericare when standing in [place].  Pt required assistance with orientation of bra and shirt before initiating donning.  Pt required tot A for LB bathing/dressing tasks.  Focus on activity tolerance, sitting balance, sit<>stand with Stedy, task initiation, BUE use, sequencing, and safety awareness to increase independence with BADLs.  Therapy Documentation Precautions:  Precautions Precautions: Fall, Other (comment) Precaution Comments: quadriplegia (LUE/LLE more impaired) PRAFOs Restrictions Weight Bearing Restrictions: No RLE Weight Bearing: Weight bearing as tolerated LLE Weight Bearing: Weight bearing as tolerated Pain: Pain Assessment Pain Assessment: No/denies pain Pain Score: 0-No pain  See Function Navigator for Current Functional Status.   Therapy/Group: Individual Therapy  Leroy Libman 01/10/2017, 9:20 AM

## 2017-01-10 NOTE — Progress Notes (Signed)
Social Work Patient ID: Aimee [name], female   DOB: 08-Jan-1976, 41 y.o.   MRN: [id number]   Have reviewed team conference information with pt and brother, [name].  Both aware and agreeable with ELOS @ 4 weeks with mod assist w/c level goals.  Pt and brother pleased with progress so far. Deny any concerns at this point.  Tonee Silverstein, LCSW

## 2017-01-10 NOTE — IPOC Note (Signed)
Overall Plan of Care [facility]) Patient Details Name: [name] Plan MRN: [id number] DOB: 10/18/1975  Admitting Diagnosis: SAH sp [place] Multiple B CVA  Hospital Problems: Active Problems:   Quadriparesis [facility])     Functional Problem List: Nursing Pain  PT Balance, Edema, Endurance, Motor, Pain, Safety, Sensory, Skin Integrity  OT Balance, Cognition, Endurance, Motor, Sensory, Vision  SLP Cognition, Nutrition, Linguistic  TR         Basic ADL's: OT Eating, Grooming, Bathing, Dressing, Toileting     Advanced  ADL's: OT       Transfers: PT Bed Mobility, Bed to Chair, Car, Manufacturing systems engineer, Metallurgist: PT Ambulation, Emergency planning/management officer, Stairs     Additional Impairments: OT Fuctional Use of Upper Extremity  SLP Swallowing, Communication, Social Cognition expression Problem Solving, Memory, Awareness  TR      Anticipated Outcomes Item Anticipated Outcome  Self Feeding set up  Swallowing  Mod I    Basic self-care  min A  Toileting  min A   Bathroom Transfers min A  Bowel/Bladder  manage with Min assist  Transfers  mod assist w/c level  Locomotion  supervision w/c mobility (power vs manual pending functional return)  Communication  Supervision  Cognition  Mod I   Pain  manage pain at or below level 4  Safety/Judgment  maintain safety with min assist   Therapy Plan: PT Intensity: Minimum of 1-2 x/day ,45 to 90 minutes PT Frequency: 5 out of 7 days PT Duration Estimated Length of Stay: 25-28 days OT Intensity: Minimum of 1-2 x/day, 45 to 90 minutes OT Frequency: 5 out of 7 days OT Duration/Estimated Length of Stay: 28-30 days SLP Intensity: Minumum of 1-2 x/day, 30 to 90 minutes SLP Frequency: 3 to 5 out of 7 days SLP Duration/Estimated Length of Stay: 4 weeks       Team Interventions: Nursing Interventions Patient/Family Education, Disease Management/Prevention, Skin Care/Wound Management, Discharge Planning, Bowel  Management, Bladder Management, Psychosocial Support, Medication Management, Dysphagia/Aspiration Precaution Training  PT interventions Ambulation/gait training, Training and development officer, Cognitive remediation/compensation, Community reintegration, Discharge planning, Disease management/prevention, DME/adaptive equipment instruction, Functional mobility training, Neuromuscular re-education, Pain management, Patient/family education, Psychosocial support, Skin care/wound management, Splinting/orthotics, Stair training, Therapeutic Activities, Therapeutic Exercise, UE/LE Strength taining/ROM, UE/LE Coordination activities, Visual/perceptual remediation/compensation, Wheelchair propulsion/positioning  OT Interventions Training and development officer, Cognitive remediation/compensation, Discharge planning, DME/adaptive equipment instruction, Functional electrical stimulation, Functional mobility training, Neuromuscular re-education, Patient/family education, Self Care/advanced ADL retraining, Psychosocial support, Therapeutic Activities, Therapeutic Exercise, UE/LE Strength taining/ROM, UE/LE Coordination activities, Visual/perceptual remediation/compensation  SLP Interventions Cognitive remediation/compensation, Cueing hierarchy, Dysphagia/aspiration precaution training, Environmental controls, Functional tasks, Internal/external aids, Medication managment, Patient/family education, Other (comment) (RMT exercises )  TR Interventions    SW/CM Interventions Discharge Planning, Psychosocial Support, Patient/Family Education    Team Discharge Planning: Destination: PT-Home ,OT- Home , SLP-Home Projected Follow-up: PT-Home health PT, 24 hour supervision/assistance, OT-  Home health OT, SLP-24 hour supervision/assistance Projected Equipment Needs: PT-Wheelchair cushion (measurements), Wheelchair (measurements), To be determined, OT- Tub/shower bench, 3 in 1 bedside comode, SLP-To be determined Equipment Details:  PT- , OT-  Patient/family involved in discharge planning: PT- Patient, Family member/caregiver,  OT-Patient, SLP-Patient  MD ELOS: 23-26 days Medical Rehab Prognosis:  Excellent Assessment: The patient has been admitted for CIR therapies with the diagnosis of SAH with tetraplegia. The team will be addressing functional mobility, strength, stamina, balance, safety, adaptive techniques and equipment, self-care, bowel and bladder mgt, patient and caregiver education, NMR, w/c use, orthotics, pain  control, cognition, communication, speech. Goals have been set at min assist to supervision with basic self-care and mobility and mod I with cognition and communication.    Meredith Staggers, MD, FAAPMR      See Team Conference Notes for weekly updates to the plan of care

## 2017-01-10 NOTE — Progress Notes (Signed)
Speech Language Pathology Daily Session Note  Patient Details  Name: Aimee [name] MRN: [id number] Date of Birth: 31-Jan-1976  Today's Date: 01/10/2017 SLP Individual Time: 0830-0930 SLP Individual Time Calculation (min): 60 min  Short Term Goals: Week 1: SLP Short Term Goal 1 (Week 1): Patient will recall new information related to her care with Min question cues.  SLP Short Term Goal 2 (Week 1): Patient will solve complex problems with Min assist question cues to recognize and correct errors. SLP Short Term Goal 3 (Week 1): Patient will consume trials of ice chips with minimal overt s/s of aspiration to determine readiness for advancement or objective swallow assessment  SLP Short Term Goal 4 (Week 1): Patient will perform EMST exercises at 45 cm H2O for 5 sets of 5 reps with a self-perceived effort level of 8/10 with Min verbal cues for accuracy with use of device. SLP Short Term Goal 5 (Week 1): Patient will achieve phonation at the word level in structured tasks with Mod assist verbal cues  Skilled Therapeutic Interventions: Skilled treatment session focused on dysphagia and cognition goals. SLP facilitated session by providing skilled observation of pt consuming ice chips. Pt with throat clear on 50% on of consumption. SLP further facilitated session by providing Mod I for return demonstration oF EMST exercises at 50 cm H2O for 5 sets of 5 reps with a self-perceived effort of 8 out of 10. Pt completed semi-complex medication management tasks with Min A faded to supervision cues. Pt also completed daily scheduling tasks with supervision cues. Pt was returned to room, left upright in wheelchair and all needs within reach. Continue per current [name] of care.      Function:  Eating Eating   Modified Consistency Diet: No             Cognition Comprehension Comprehension assist level: Follows basic conversation/direction with no assist  Expression   Expression assist level:  Expresses basic 50 - 74% of the time/requires cueing 25 - 49% of the time. Needs to repeat parts of sentences.  Social Interaction Social Interaction assist level: Interacts appropriately with others with medication or extra time (anti-anxiety, antidepressant).  Problem Solving Problem solving assist level: Solves basic problems with no assist  Memory Memory assist level: Recognizes or recalls 90% of the time/requires cueing < 10% of the time    Pain Pain Assessment Pain Assessment: No/denies pain Pain Score: 0-No pain  Therapy/Group: Individual Therapy   Peja Allender B. Rutherford Nail, M.S., CCC-SLP Speech-Language Pathologist   Wisdom Seybold 01/10/2017, 11:29 AM

## 2017-01-10 NOTE — Progress Notes (Signed)
Social Work  Social Work Assessment and Plan  Patient Details  Name: Aimee [name] Plan MRN: [id number] Date of Birth: 1976-04-24  Today's Date: 01/10/2017  Problem List:  Patient Active Problem List   Diagnosis Date Noted  . Seizure prophylaxis   . Quadriparesis ([place])   . Class 1 obesity due to excess calories with serious comorbidity and body mass index (BMI) of 30.0 to 30.9 in adult   . Benign essential HTN   . Diabetes mellitus type 2 in obese ([place])   . Leukocytosis   . Acute encephalopathy   . Somnolence   . Seizure ([place])   . Stupor   . Ventilator dependent ([place])   . SAH (subarachnoid hemorrhage) ([place]) 12/25/2016  . 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 Diabetes mellitus without complication ([place])   . Hypertension    Past Surgical History:  Past Surgical History:  Procedure Laterality Date  . IR ANGIO INTRA EXTRACRAN SEL INTERNAL CAROTID BILAT MOD SED  12/25/2016  . IR ANGIO VERTEBRAL SEL VERTEBRAL UNI L MOD SED  12/25/2016  . IR ANGIOGRAM FOLLOW UP STUDY  12/25/2016  . IR ANGIOGRAM FOLLOW UP STUDY  12/25/2016  . IR ANGIOGRAM FOLLOW UP STUDY  12/25/2016  . IR ANGIOGRAM FOLLOW UP STUDY  12/25/2016  . IR TRANSCATH/EMBOLIZ  12/25/2016  . RADIOLOGY WITH ANESTHESIA N/A 12/25/2016   Procedure: RADIOLOGY WITH ANESTHESIA;  Surgeon: Consuella Lose, MD;  Location: [facility];  Service: Radiology;  Laterality: N/A;   Social History:  reports that she has never smoked. She has never used smokeless tobacco. She reports that she does not drink alcohol. Her drug history is not on file.  Family / Support Systems Marital Status: Single Patient Roles: Other (Comment) (sister) Other Supports: brother, Derenda Mis @ [phone] or (C) [phone];  sister-in-law, Malena Edman @ (C) [phone];  sister-in-law, Jearld Pies Anticipated Caregiver: Primary caregivers will be pt's 2 sisters in law,  Jearld Pies and Malena Edman during the daytime with assistance from brother Derenda Mis when he gets off work at [employer] Ability/Limitations of Caregiver: [name] states pt's 2 sisters in law have no limitations.  They have alternating schedules so one will always be available to care for pt. Caregiver Availability: 24/7 Family Dynamics: Pt describes a very good relationship with her brother and sister-in-laws - no concerns.  Social History Preferred language: Spanish Religion: None Cultural Background: Pt originally from [place].  In the [place] x 15 yrs. Read: Yes Write: Yes Employment Status: Employed Name of Employer: a Nurse, adult and as a Retail buyer Return to Work Plans: doubtful Freight forwarder Issues: Pt is not a legal resident. Guardian/Conservator: None - per MD, pt is capable of making decisions on her own behalf.   Abuse/Neglect Physical Abuse: Denies Verbal Abuse: Denies Sexual Abuse: Denies Exploitation of patient/patient's resources: Denies Self-Neglect: Denies  Emotional Status [name] affect, behavior adn adjustment status: Pt very pleasant, talkative and describes herself as "motivated" for CIR.  States she is "trying to stay positive and not let myself get down..." Smiling throughout interview and very appreciative of any assist I can offer.  Will monitor through stay and refer for neuropsychology as indicated. Recent Psychosocial Issues: None Pyschiatric History: None Substance Abuse History: None  Patient / Family Perceptions, Expectations & Goals Pt/Family understanding of illness & functional limitations: Pt and brother with basic understanding of her SAH, infarcts and current functional limitations/ need for  CIR. Premorbid pt/family roles/activities: Pt was completely independent and working two jobs. Anticipated changes in roles/activities/participation: Family to assume caregiver support roles. Pt/family expectations/goals: "I want to walk  again."  [employer]: Other (Comment) (was seen a few times at a medical clinic in [place]) Premorbid Home Care/DME Agencies: None Transportation available at discharge: yes Resource referrals recommended: Neuropsychology  Discharge Planning Living Arrangements: Other relatives Support Systems: Other relatives Type of Residence: Private residence Insurance Resources: Teacher, adult education Resources: Family Support Financial Screen Referred: Previously completed Living Expenses: Rent Money Management: Patient Does the patient have any problems obtaining your medications?: Yes (Describe) (No insurance) Home Management: Pt and family Patient/Family Preliminary Plans: Pt, brother and his wife are to move in with their sister-in-law who has a one level home and share in providing 24/7 assistance. Social Work Anticipated Follow Up Needs: HH/OP (under "charity care" via [facility] if possible) Expected length of stay: 4 weeks  Clinical Impression Very pleasant, Hispanic woman here following a SAH, multiple infarcts and very motivated for therapies.  She is living with her brother and sister-in-law and family is prepared to provide 24/7 assistance.  She denies any significant emotional distress, however, will monitor and refer for neuropsych as indicated.    Aimee [name] 01/10/2017, 1:52 PM

## 2017-01-11 ENCOUNTER — Inpatient Hospital Stay (HOSPITAL_COMMUNITY): Payer: Self-pay | Admitting: *Deleted

## 2017-01-11 ENCOUNTER — Inpatient Hospital Stay (HOSPITAL_COMMUNITY): Payer: Self-pay

## 2017-01-11 ENCOUNTER — Inpatient Hospital Stay (HOSPITAL_COMMUNITY): Payer: Self-pay | Admitting: Physical Therapy

## 2017-01-11 ENCOUNTER — Inpatient Hospital Stay (HOSPITAL_COMMUNITY): Payer: Self-pay | Admitting: Speech Pathology

## 2017-01-11 LAB — GLUCOSE, CAPILLARY
Glucose-Capillary: 134 mg/dL — ABNORMAL HIGH (ref 65–99)
Glucose-Capillary: 126 mg/dL — ABNORMAL HIGH (ref 65–99)
Glucose-Capillary: 129 mg/dL — ABNORMAL HIGH (ref 65–99)
Glucose-Capillary: 111 mg/dL — ABNORMAL HIGH (ref 65–99)

## 2017-01-11 MED ORDER — DEXAMETHASONE 6 MG PO TABS
6.0000 mg | ORAL_TABLET | Freq: Three times a day (TID) | ORAL | Status: DC
  Administered 2017-01-11 – 2017-01-16 (×15): 6 mg via ORAL
  Filled 2017-01-11 (×15): qty 1

## 2017-01-11 MED ORDER — DOCUSATE SODIUM 100 MG PO CAPS: 100.0000 mg | ORAL_CAPSULE | Freq: Two times a day (BID) | ORAL | Status: DC

## 2017-01-11 MED ORDER — DOCUSATE SODIUM 100 MG PO CAPS
100.0000 mg | ORAL_CAPSULE | Freq: Every day | ORAL | Status: DC
  Administered 2017-01-12 – 2017-02-05 (×25): 100 mg via ORAL
  Filled 2017-01-11 (×26): qty 1

## 2017-01-11 MED ORDER — METFORMIN HCL 500 MG PO TABS
500.0000 mg | ORAL_TABLET | Freq: Every day | ORAL | Status: DC
  Administered 2017-01-12 – 2017-02-05 (×25): 500 mg via ORAL
  Filled 2017-01-11 (×25): qty 1

## 2017-01-11 MED ORDER — PANTOPRAZOLE SODIUM 40 MG PO TBEC
40.0000 mg | DELAYED_RELEASE_TABLET | Freq: Every day | ORAL | Status: DC
  Administered 2017-01-12 – 2017-02-05 (×25): 40 mg via ORAL
  Filled 2017-01-11 (×25): qty 1

## 2017-01-11 NOTE — Progress Notes (Signed)
[facility] PHYSICAL MEDICINE & REHABILITATION     PROGRESS NOTE    Subjective/Complaints: No new complaints. Having intermittent incontinence. Denies pain  ROS: pt denies nausea, vomiting, diarrhea, cough, shortness of breath or chest pain      Objective: Vital Signs: Blood pressure 128/88, pulse 89, temperature 98.2 F (36.8 C), temperature source Oral, resp. rate 18, weight 89.9 kg (198 lb 4.1 oz), last menstrual period 12/31/2016, SpO2 99 %. No results found. No results for input(s): WBC, HGB, HCT, PLT in the last 72 hours. No results for input(s): NA, K, CL, GLUCOSE, BUN, CREATININE, CALCIUM in the last 72 hours.  Invalid input(s): CO CBG (last 3)   Recent Labs  01/10/17 1648 01/10/17 2028 01/11/17 0655  GLUCAP 172* 193* 134*    Wt Readings from Last 3 Encounters:  01/08/17 89.9 kg (198 lb 4.1 oz)  01/07/17 88.9 kg (196 lb)    Physical Exam:  Constitutional: She appears well-developed.  No distress HENT:  Head: Normocephalic and atraumatic.  Eyes: EOM are normal. Right eye exhibits no discharge. Left eye exhibits no discharge.  Neck: Normal range of motion. Neck supple. No thyromegaly present.  Cardiovascular: RRR   Respiratory: CTA B except for some crackles at right base GI: soft NR  Musculoskeletal: She exhibits no edema or tenderness.  Neurological:   oriented  Motor: RUE: 4+/5 proximal to distal LUE: Shoulder abduction 3-/5, elbow flex/ext 3-/5, hand grip 4-/5 RLE: 3-/5 right hip flexion, KE, 4/5 ADF/PF LLE:  2/5 proximal to distal  Skin: Skin is warm and dry.  Psychiatric: She has a normal mood and affect. Her behavior is normal.    Assessment/Plan: 1. 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 Physiatrist is providing close team supervision and 24 hour management of active medical problems listed  below. Physiatrist and rehab team continue to assess barriers to discharge/monitor patient progress toward functional and medical goals.  Function:  Bathing Bathing position   Position: Wheelchair/chair at sink  Bathing parts Body parts bathed by patient: Left arm, Chest, Abdomen, Right upper leg, Left upper leg Body parts bathed by helper: Right arm, Front perineal area, Buttocks, Right lower leg, Left lower leg, Back  Bathing assist Assist Level: 2 helpers      Upper Body Dressing/Undressing Upper body dressing   What is the patient wearing?: Bra, Pull over shirt/dress Bra - Perfomed by patient: Thread/unthread right bra strap Bra - Perfomed by helper: Thread/unthread left bra strap, Hook/unhook bra (pull down sports bra) Pull over shirt/dress - Perfomed by patient: Thread/unthread right sleeve Pull over shirt/dress - Perfomed by helper: Thread/unthread left sleeve, Put head through opening, Pull shirt over trunk        Upper body assist Assist Level: 2 helpers      Lower Body Dressing/Undressing Lower body dressing   What is the patient wearing?: Underwear, Pants, Non-skid slipper socks       Pants- Performed by helper: Thread/unthread right pants leg, Thread/unthread left pants leg, Pull pants up/down   Non-skid slipper socks- Performed by helper: Don/doff right sock, Don/doff left sock                  Lower body assist Assist for lower body dressing: 2 Helpers      Toileting Toileting Toileting activity did not occur: N/A   Toileting steps completed by helper: Adjust clothing prior to toileting, Performs perineal  hygiene, Adjust clothing after toileting    Toileting assist     Transfers Chair/bed transfer   Chair/bed transfer method: Stand pivot Chair/bed transfer assist level: Maximal assist (Pt 25 - 49%/lift and lower) Chair/bed transfer assistive device: Sliding board, Armrests Mechanical lift: Stedy   Locomotion Ambulation Ambulation activity did  not occur: Safety/medical concerns (quadriplegia)         Wheelchair   Type:  (TBD) Max wheelchair distance: 150 Assist Level: Moderate assistance (Pt 50 - 74%)  Cognition Comprehension Comprehension assist level: Follows basic conversation/direction with no assist  Expression Expression assist level: Expresses basic 50 - 74% of the time/requires cueing 25 - 49% of the time. Needs to repeat parts of sentences.  Social Interaction Social Interaction assist level: Interacts appropriately with others with medication or extra time (anti-anxiety, antidepressant).  Problem Solving Problem solving assist level: Solves basic problems with no assist  Memory Memory assist level: Recognizes or recalls 90% of the time/requires cueing < 10% of the time   Medical Problem List and Plan: 1.  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  DVT Prophylaxis/Anticoagulation: SCDs.  -left gastroc thrombus---mobilize, and observe only for now---recheck prior to discharge 3. Pain Management: Ultram as needed 4. Mood: Provide emotional support 5. Neuropsych: This patient is capable of making decisions on her own behalf. 6. Skin/Wound Care: Routine skin checks 7. Fluids/Electrolytes/Nutrition: Routine I&O with follow-up chemistries all reviewed personally today  -encourage PO 8. Seizure prophylaxis. Keppra 500 mg every 12 hours 9. Hypertension.Nimotop as per protocol21 days.   10. Diabetes mellitus.SSI. Monitor closely while on Decadron. Patient on Glucophage 500 mg daily prior to admission.    -sugars trending up---resume glucophage 11. Obesity. BMI 34.7 KG. 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

## 2017-01-11 NOTE — Progress Notes (Signed)
Occupational Therapy Session Note  Patient Details  Name: Aimee [name] MRN: [id number] Date of Birth: 05/29/1976  Today's Date: 01/11/2017 OT Individual Time: 0700-0800 OT Individual Time Calculation (min): 60 min    Short Term Goals: Week 1:  OT Short Term Goal 1 (Week 1): Pt will be able to roll in bed with min A for LB self care. OT Short Term Goal 2 (Week 1): Pt will be able to sit to EOB with min A to prepare for UB self care. OT Short Term Goal 3 (Week 1): Pt will maintain dynamic sitting with min A during UB self care. OT Short Term Goal 4 (Week 1): Pt will bathe UB with min A. OT Short Term Goal 5 (Week 1): Pt will don shirt with mod A.  Skilled Therapeutic Interventions/Progress Updates:    Pt resting in bed upon arrival.  Pt engaged in BADL retraining including bathing/dressing with sit<>stand from w/c with Stedy.  Pt required mod A for supine>sit EOB in preparation for transfer to w/c.  Pt completed UB bathing at supervision level this morning and demonstrated increased LUE use during tasks.  Pt initiates use of LUE during functional tasks but requires min verbal cues for positioning and awareness when not engaged in functional task. Pt donned pullover shirt with mod A but required assistance with donning bra.  Pt required max A for sit<>stand in [place] for LB bathing and dressing tasks.  Pt requires assistance with pericare and lower legs.  Pt remained in TIS w/c for eating breakfast.  Pt requires assistance with opening containers.  Focus on activity tolerance, bed mobility, sit<>stand, increased LUE use, task initiation and sequencing, standing balance, and safety awareness to increase independence with BADLs.   Therapy Documentation Precautions:  Precautions Precautions: Fall, Other (comment) Precaution Comments: quadriplegia (LUE/LLE more impaired) PRAFOs Restrictions Weight Bearing Restrictions: No RLE Weight Bearing: Weight bearing as tolerated LLE Weight Bearing:  Weight bearing as tolerated Pain:  Pt denies pain  See Function Navigator for Current Functional Status.   Therapy/Group: Individual Therapy  Leroy Libman 01/11/2017, 8:52 AM

## 2017-01-11 NOTE — Progress Notes (Signed)
Physical Therapy Session Note  Patient Details  Name: Aimee [name] MRN: [id number] Date of Birth: [birth year]  Today's Date: 01/11/2017 PT Individual Time: [phone]; 1400-1500 PT Individual Time Calculation (min): 60 min; 60 min   Short Term Goals: Week 1:  PT Short Term Goal 1 (Week 1): Pt will be able to initiate propped on elbow <> sit with mod assist PT Short Term Goal 2 (Week 1): Pt will be able to perform slideboard transfer with +2 assist but pt directing steps of transfer with min verbal cues PT Short Term Goal 3 (Week 1): Pt will be able to verbalize pressure relief schedule while OOB PT Short Term Goal 4 (Week 1): Pt will be able to perform functional sitting balance with min assist x 5 min   Skilled Therapeutic Interventions/Progress Updates:  Tx 1: Pt received seated and semi-reclined in tilt in space w/c. Pt denies pain at start of session. Pt total A to rehab gym. Max A sit>stand from tilt in space w/c to steady; total A transfer from w/c to mat using stedy. Pt max A sit>stand from low mat table in stedy. Pillows placed around lateral and anterior knees for improved LE positioning in standing. Min guard sit>stand from elevated seat in stedy x5 with min A for eccentric stand>sit with manual facilitation at R knee to prevent knee buckling. Verbal and tactile cues and manual facilitation for weight shift to the L and anterior shift of L hip in standing to encourage equal weight-bearing and midline position in standing. Reaching to L side for horseshoes while seated on stedy seat to encourage attention and weight shifting to L side. Pt demonstrates improved use of L UE this session and reports she is trying to use it more often. Unsupported sitting balance activities with feet supported on step; zoom ball and reaching and tossing horseshoes. Max A+2 sit>stand from mat table using stedy due to fatigue; total A transfer from mat to tilt in space w/c in steady. Pt returned to room and  left seated semi-reclined in tilt in space w/c with all needs met.   Tx 2: Pt received supine in bed. Pt reports need to use the restroom. Mod A rolling L with use of bedrails, max A lying>sit with verbal cues to assist by pushing with L UE. Max A+2 sit>stand using stedy from low deflated bed and verbal cues to stand up tall. Pt total A to toilet elevated by BSC. Pt min guard sit>stand from seat on stedy and max A to pull down pants and brief. Max A to sit>stand from [facility] using stedy; brief changed and pt total A for peri hygiene while standing using stedy. Pt total A transfer to manual w/c with mod A for stand>sit. Pt propelled manual w/c 181f with occasional min A for steering and management of L UE. Pt required verbal cues to avoid obstacles on her L side and reach back with her L UE for w/c propulsion. Pt max A +2 uphill slide board transfer from manual w/c to Nustep. Nustep x8 min with B UEs and LEs for endurance and NMR of LLE. With no use of UEs, pt provided with verbal and tactile cues to push with R/L LEs. Noted co-contraction of R LE when pt attempts to extend L LE. Max A+3 slide board transfer from Nustep to tilt in space w/c to stabilize w/c during transfer. Pt total A back to pt's room and left seated in tilt in space w/c with all needs met.   Therapy  Documentation Precautions:  Precautions Precautions: Fall, Other (comment) Precaution Comments: quadriplegia (LUE/LLE more impaired) PRAFOs Restrictions Weight Bearing Restrictions: No RLE Weight Bearing: Weight bearing as tolerated LLE Weight Bearing: Weight bearing as tolerated   See Function Navigator for Current Functional Status.   Therapy/Group: Individual Therapy  Alysia Penna 01/11/2017, 3:55 PM

## 2017-01-11 NOTE — Progress Notes (Signed)
Speech Language Pathology Daily Session Note  Patient Details  Name: Aimee [name] MRN: [id number] Date of Birth: [birth year]  Today's Date: 01/11/2017 SLP Individual Time: 8032-1224 SLP Individual Time Calculation (min): 27 min  Short Term Goals: Week 1: SLP Short Term Goal 1 (Week 1): Patient will recall new information related to her care with Min question cues.  SLP Short Term Goal 2 (Week 1): Patient will solve complex problems with Min assist question cues to recognize and correct errors. SLP Short Term Goal 3 (Week 1): Patient will consume trials of ice chips with minimal overt s/s of aspiration to determine readiness for advancement or objective swallow assessment  SLP Short Term Goal 4 (Week 1): Patient will perform EMST exercises at 45 cm H2O for 5 sets of 5 reps with a self-perceived effort level of 8/10 with Min verbal cues for accuracy with use of device. SLP Short Term Goal 5 (Week 1): Patient will achieve phonation at the word level in structured tasks with Mod assist verbal cues  Skilled Therapeutic Interventions:  Pt was seen for skilled ST targeting goals for speech and swallowing.  Therapist facilitated the session with trials of ice chips following oral care to continue working towards diet progression.  Pt consumed trials with min verbal cues for use of swallowing precautions and intermittent delayed throat clearing.  Pt completed 5 sets of 5 EMST exercises at prescribed resistance with supervision cues for accurate return demonstration.  After completion of exercises pt was noted with markedly improved phonation.  Pt encouraged to continue use of EMST device in between therapy sessions to maximize treatment effects.  Pt was left in wheelchair with call bell within reach.       Function:  Eating Eating   Modified Consistency Diet: Yes Eating Assist Level: Set up assist for;Supervision or verbal cues   Eating Set Up Assist For: Opening containers        Cognition Comprehension Comprehension assist level: Follows basic conversation/direction with no assist  Expression   Expression assist level: Expresses basic 50 - 74% of the time/requires cueing 25 - 49% of the time. Needs to repeat parts of sentences.  Social Interaction Social Interaction assist level: Interacts appropriately with others with medication or extra time (anti-anxiety, antidepressant).  Problem Solving Problem solving assist level: Solves basic 90% of the time/requires cueing < 10% of the time  Memory Memory assist level: Recognizes or recalls 90% of the time/requires cueing < 10% of the time    Pain Pain Assessment Pain Assessment: No/denies pain  Therapy/Group: Individual Therapy  Darlynn Ricco, Selinda Orion 01/11/2017, 4:20 PM

## 2017-01-11 NOTE — Progress Notes (Signed)
Occupational Therapy Note  Patient Details  Name: Aimee [name] MRN: [id number] Date of Birth: Oct 09, 1975  Today's Date: 01/11/2017 OT Individual Time: 1300-1330 OT Individual Time Calculation (min): 30 min   Pt denied pain Individual Therapy  Focus on LUE [place] with theraputty.  Pt challenged with various BUE and LUE specific tasks-flattening putty, rolling into ball, pinches, and removing/replacing small beads.  Pt also challenged with placing putty in container and replacing lid.  Pt continues to demonstrate decreased [place] with LUE but able to complete tasks with extra time.  Pt dropped small beads X 4 during task.  Pt remained in bed with nephew present and all needs within reach.    [name] [facility] 01/11/2017, 1:33 PM

## 2017-01-12 ENCOUNTER — Inpatient Hospital Stay (HOSPITAL_COMMUNITY): Payer: Self-pay

## 2017-01-12 ENCOUNTER — Inpatient Hospital Stay (HOSPITAL_COMMUNITY): Payer: Self-pay | Admitting: Occupational Therapy

## 2017-01-12 DIAGNOSIS — B9620 Unspecified Escherichia coli [E. coli] as the cause of diseases classified elsewhere: Secondary | ICD-10-CM

## 2017-01-12 DIAGNOSIS — I10 Essential (primary) hypertension: Secondary | ICD-10-CM

## 2017-01-12 DIAGNOSIS — N390 Urinary tract infection, site not specified: Secondary | ICD-10-CM

## 2017-01-12 DIAGNOSIS — Z298 Encounter for other specified prophylactic measures: Secondary | ICD-10-CM

## 2017-01-12 LAB — GLUCOSE, CAPILLARY
Glucose-Capillary: 102 mg/dL — ABNORMAL HIGH (ref 65–99)
Glucose-Capillary: 143 mg/dL — ABNORMAL HIGH (ref 65–99)
Glucose-Capillary: 110 mg/dL — ABNORMAL HIGH (ref 65–99)
Glucose-Capillary: 162 mg/dL — ABNORMAL HIGH (ref 65–99)

## 2017-01-12 NOTE — Progress Notes (Signed)
Occupational Therapy Note  Patient Details  Name: Aimee [name] MRN: [id number] Date of Birth: [birth year]  Today's Date: 01/12/2017 OT Individual Time: 1300-1355 OT Individual Time Calculation (min): 55 min   Pt denies pain Individual therapy  Pt engaged in unsupported sitting tasks with focus on increased LUE [place] and gross motor activities.  Pt required max A for sit<>stand with Stedy for transfers w/c<>mat.  Pt required max verbal cues for sitting posture with increased anterior pelvic tilt.  Pt challenged with grasping clothes pins, large pegs, and small pegs and placed in proper receptacle.  Pt continues to demonstrate significant LUE weakness (proximal>distal) and compensatory techniques (recruiting of ancillary muscle groups for shoulder flexion >45 degrees). Pt requires min verbal cues to attend to her LUE when using for tasks and improve accuracy with completion of tasks. Pt required multiple rest breaks during activities.  Pt returned to room and remained in w/c with brother present.    [name] [facility] 01/12/2017, 2:45 PM

## 2017-01-12 NOTE — Progress Notes (Signed)
Occupational Therapy Session Note  Patient Details  Name: Aimee [name] MRN: [id number] Date of Birth: 1975-12-07  Today's Date: 01/12/2017 OT Individual Time: 5462-7035 OT Individual Time Calculation (min): 72 min    Short Term Goals: Week 1:  OT Short Term Goal 1 (Week 1): Pt will be able to roll in bed with min A for LB self care. OT Short Term Goal 2 (Week 1): Pt will be able to sit to EOB with min A to prepare for UB self care. OT Short Term Goal 3 (Week 1): Pt will maintain dynamic sitting with min A during UB self care. OT Short Term Goal 4 (Week 1): Pt will bathe UB with min A. OT Short Term Goal 5 (Week 1): Pt will don shirt with mod A.  Skilled Therapeutic Interventions/Progress Updates:    Upon entering the room, pt seated in wheelchair with brother present in room. Pt with no c/o pain this session but does report fatigue. OT propelled wheelchair outside and pt seated at table for card task with focus on [facility] task of B UE's as well as visual scanning to locate cards on table to L side. Pt returned to unit and continued focus on isolated finger movements of L UE and palmar translation tasks with increased difficulty. Pt returning to room at end of session with brother remaining. Call bell and all needed items within reach upon exiting the room.   Therapy Documentation Precautions:  Precautions Precautions: Fall, Other (comment) Precaution Comments: quadriplegia (LUE/LLE more impaired) PRAFOs Restrictions Weight Bearing Restrictions: No RLE Weight Bearing: Weight bearing as tolerated LLE Weight Bearing: Weight bearing as tolerated General:   Vital Signs: Therapy Vitals Temp: 97.7 F (36.5 C) Temp Source: Oral Pulse Rate: (!) 111 Resp: 19 BP: 129/85 Patient Position (if appropriate): Sitting Oxygen Therapy SpO2: 100 % O2 Device: Not Delivered Pain:   ADL: ADL ADL Comments: refer functional navigator Vision   Perception    Praxis   Exercises:   Other  Treatments:    See Function Navigator for Current Functional Status.   Therapy/Group: Individual Therapy  Gypsy Decant 01/12/2017, 4:15 PM

## 2017-01-12 NOTE — Progress Notes (Signed)
[place] PHYSICAL MEDICINE & REHABILITATION     PROGRESS NOTE    Subjective/Complaints: Pt seen laying in bed this AM.  She slept well overnight.    ROS: Denies CP, SOB, N/V/D.  Objective: Vital Signs: Blood pressure 138/86, pulse 87, temperature 98.6 F (37 C), temperature source Oral, resp. rate 18, weight 89.9 kg (198 lb 4.1 oz), last menstrual period 12/31/2016, SpO2 100 %. No results found. No results for input(s): WBC, HGB, HCT, PLT in the last 72 hours. No results for input(s): NA, K, CL, GLUCOSE, BUN, CREATININE, CALCIUM in the last 72 hours.  Invalid input(s): CO CBG (last 3)   Recent Labs  01/11/17 1630 01/11/17 2119 01/12/17 0612  GLUCAP 129* 111* 102*    Wt Readings from Last 3 Encounters:  01/08/17 89.9 kg (198 lb 4.1 oz)  01/07/17 88.9 kg (196 lb)    Physical Exam:  Constitutional: She appears well-developed. NAD. HENT: Normocephalic and atraumatic.  Eyes: EOMI. No discharge.  Cardiovascular: RRR. No JVD. Respiratory: CTA B. Unlabored. GI: soft, BS+  Musculoskeletal: She exhibits no edema or tenderness.  Neurological: Alert and oriented  Motor: RUE: 4+/5 proximal to distal LUE: Shoulder abduction 4-/5, elbow flex/ext 4-/5, hand grip 4-/5 RLE: 3-/5 right hip flexion, KE, 4/5 ADF/PF LLE:  2/5 proximal to distal  Skin: Skin is warm and dry.  Psychiatric: She has a normal mood and affect. Her behavior is normal.   Assessment/Plan: 1. 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 Physiatrist is providing close team supervision and 24 hour management of active medical problems listed below. Physiatrist and rehab team continue to assess barriers to discharge/monitor patient progress toward functional and medical goals.  Function:  Bathing Bathing position   Position: Wheelchair/chair at sink  Bathing  parts Body parts bathed by patient: Left arm, Chest, Abdomen, Right upper leg, Left upper leg Body parts bathed by helper: Front perineal area, Buttocks, Right lower leg, Left lower leg, Back  Bathing assist Assist Level: 2 helpers      Upper Body Dressing/Undressing Upper body dressing   What is the patient wearing?: Bra, Pull over shirt/dress Bra - Perfomed by patient: Thread/unthread right bra strap Bra - Perfomed by helper: Thread/unthread left bra strap, Hook/unhook bra (pull down sports bra) Pull over shirt/dress - Perfomed by patient: Thread/unthread right sleeve, Put head through opening Pull over shirt/dress - Perfomed by helper: Thread/unthread left sleeve, Pull shirt over trunk        Upper body assist Assist Level: 2 helpers      Lower Body Dressing/Undressing Lower body dressing   What is the patient wearing?: Underwear, Pants, Non-skid slipper socks   Underwear - Performed by helper: Thread/unthread right underwear leg, Thread/unthread left underwear leg, Pull underwear up/down   Pants- Performed by helper: Thread/unthread right pants leg, Thread/unthread left pants leg, Pull pants up/down   Non-skid slipper socks- Performed by helper: Don/doff right sock, Don/doff left sock                  Lower body assist Assist for lower body dressing: 2 Helpers      Toileting Toileting Toileting activity did not occur: N/A   Toileting steps completed by helper: Adjust clothing prior to toileting, Performs perineal hygiene, Adjust clothing after toileting    Toileting assist Assist level: Two helpers (per Linden Dolin, NT)   Transfers Chair/bed transfer   Chair/bed  transfer method: Lateral scoot Chair/bed transfer assist level: 2 helpers (uphill transfer from w/c to [employer]) Chair/bed transfer assistive device: Sliding board Mechanical lift: Ecologist Ambulation activity did not occur: Safety/medical concerns (quadriplegia)          Wheelchair   Type:  (TBD) Max wheelchair distance: 150 Assist Level: Touching or steadying assistance (Pt > 75%)  Cognition Comprehension Comprehension assist level: Follows basic conversation/direction with no assist  Expression Expression assist level: Expresses basic 50 - 74% of the time/requires cueing 25 - 49% of the time. Needs to repeat parts of sentences.  Social Interaction Social Interaction assist level: Interacts appropriately with others with medication or extra time (anti-anxiety, antidepressant).  Problem Solving Problem solving assist level: Solves basic 90% of the time/requires cueing < 10% of the time  Memory Memory assist level: Recognizes or recalls 90% of the time/requires cueing < 10% of the time   Medical Problem List and Plan: 1.  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  DVT Prophylaxis/Anticoagulation: SCDs.  Left gastroc thrombus---mobilize, and observe only for now---recheck prior to discharge 3. Pain Management: Ultram as needed 4. Mood: Provide emotional support 5. Neuropsych: This patient is capable of making decisions on her own behalf. 6. Skin/Wound Care: Routine skin checks 7. Fluids/Electrolytes/Nutrition: Routine I&Os  -encourage PO 8. Seizure prophylaxis. Keppra 500 mg every 12 hours 9. Hypertension.Nimotop as per protocol21 days.    Controlled 6/9 10. Diabetes mellitus.SSI. Monitor closely while on Decadron. Patient on Glucophage 500 mg daily prior to admission.    Resumed glucophage  Improving 11. Obesity. BMI 34.7 KG. 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 E.Coli UTI  Macrobid started on 6/7--7 days  LOS (Days) 5 A FACE TO FACE EVALUATION WAS PERFORMED  Kiante Ciavarella Lorie Phenix, MD 01/12/2017 10:26 AM

## 2017-01-12 NOTE — Progress Notes (Signed)
Occupational Therapy Session Note  Patient Details  Name: Aimee [name] MRN: [id number] Date of Birth: 1976-07-14  Today's Date: 01/12/2017 OT Individual Time: 1000-1055 OT Individual Time Calculation (min): 55 min    Short Term Goals: Week 1:  OT Short Term Goal 1 (Week 1): Pt will be able to roll in bed with min A for LB self care. OT Short Term Goal 2 (Week 1): Pt will be able to sit to EOB with min A to prepare for UB self care. OT Short Term Goal 3 (Week 1): Pt will maintain dynamic sitting with min A during UB self care. OT Short Term Goal 4 (Week 1): Pt will bathe UB with min A. OT Short Term Goal 5 (Week 1): Pt will don shirt with mod A.  Skilled Therapeutic Interventions/Progress Updates:    Pt engaged in BADL retraining including bathing/dressing with sit<>stand (Stedy) from w/c at sink.  Pt required max A for supine>sit EOB and max A for sit>stand with Stedy to transfer to w/c.  Pt required max A for LB bathing/dfressing tasks and supervision for UB bathing tasks and min A for donning shirt/mod A for donning bra.  Pt demonstrated increased functional use of LUE during bathing and dressing tasks but continues to require mod verbal cues for awareness/positioning of LUE when not engaged functionally.  Focus on activity tolerance, sitting balance, sit<>stand, standing balance, LUE use, task initiation, sequencing, and safety awareness to increase independence with BADLs.   Therapy Documentation Precautions:  Precautions Precautions: Fall, Other (comment) Precaution Comments: quadriplegia (LUE/LLE more impaired) PRAFOs Restrictions Weight Bearing Restrictions: No RLE Weight Bearing: Weight bearing as tolerated LLE Weight Bearing: Weight bearing as tolerated Pain:  Pt denied pain    See Function Navigator for Current Functional Status.   Therapy/Group: Individual Therapy  Leroy Libman 01/12/2017, 11:07 AM

## 2017-01-12 NOTE — Plan of Care (Signed)
Problem: RH SKIN INTEGRITY Goal: RH STG SKIN FREE OF INFECTION/BREAKDOWN With min assist  Outcome: Progressing Skin without redness/ no breakdown  Problem: RH PAIN MANAGEMENT Goal: RH STG PAIN MANAGED AT OR BELOW [name] PAIN GOAL At or below level 4  Outcome: Progressing Pt without c/o pain

## 2017-01-13 DIAGNOSIS — I8290 Acute embolism and thrombosis of unspecified vein: Secondary | ICD-10-CM

## 2017-01-13 DIAGNOSIS — E669 Obesity, unspecified: Secondary | ICD-10-CM

## 2017-01-13 DIAGNOSIS — E1169 Type 2 diabetes mellitus with other specified complication: Secondary | ICD-10-CM

## 2017-01-13 LAB — GLUCOSE, CAPILLARY
Glucose-Capillary: 124 mg/dL — ABNORMAL HIGH (ref 65–99)
Glucose-Capillary: 110 mg/dL — ABNORMAL HIGH (ref 65–99)
Glucose-Capillary: 93 mg/dL (ref 65–99)
Glucose-Capillary: 123 mg/dL — ABNORMAL HIGH (ref 65–99)

## 2017-01-13 NOTE — Progress Notes (Signed)
[place] PHYSICAL MEDICINE & REHABILITATION     PROGRESS NOTE    Subjective/Complaints: Pt seen laying in bed this AM.  She slept better overnight.  Family at bedside.    ROS: Denies CP, SOB, N/V/D.  Objective: Vital Signs: Blood pressure 133/90, pulse 68, temperature 98.9 F (37.2 C), temperature source Oral, resp. rate 18, weight 89.9 kg (198 lb 4.1 oz), last menstrual period 12/31/2016, SpO2 100 %. No results found. No results for input(s): WBC, HGB, HCT, PLT in the last 72 hours. No results for input(s): NA, K, CL, GLUCOSE, BUN, CREATININE, CALCIUM in the last 72 hours.  Invalid input(s): CO CBG (last 3)   Recent Labs  01/12/17 1700 01/12/17 2058 01/13/17 0628  GLUCAP 110* 162* 124*    Wt Readings from Last 3 Encounters:  01/08/17 89.9 kg (198 lb 4.1 oz)  01/07/17 88.9 kg (196 lb)    Physical Exam:  Constitutional: She appears well-developed. NAD. HENT: Normocephalic and atraumatic.  Eyes: EOMI. No discharge.  Cardiovascular: RRR. No JVD. Respiratory: CTA B. Unlabored. GI: soft, BS+  Musculoskeletal: She exhibits no edema or tenderness.  Neurological: Alert and oriented  Motor: RUE: 4+/5 proximal to distal LUE: Shoulder abduction 4-/5, elbow flex/ext 4-/5, hand grip 4-/5 RLE: 3-/5 right hip flexion, KE, 4/5 ADF/PF LLE:  2+/5 proximal to distal  Skin: Skin is warm and dry.  Psychiatric: She has a normal mood and affect. Her behavior is normal.   Assessment/Plan: 1. 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 Physiatrist is providing close team supervision and 24 hour management of active medical problems listed below. Physiatrist and rehab team continue to assess barriers to discharge/monitor patient progress toward functional and medical goals.  Function:  Bathing Bathing position   Position:  Wheelchair/chair at sink  Bathing parts Body parts bathed by patient: Right arm, Left arm, Chest, Abdomen, Front perineal area, Right upper leg, Left upper leg Body parts bathed by helper: Buttocks, Right lower leg, Left lower leg, Back  Bathing assist Assist Level: 2 helpers      Upper Body Dressing/Undressing Upper body dressing   What is the patient wearing?: Bra, Pull over shirt/dress Bra - Perfomed by patient: Thread/unthread right bra strap, Thread/unthread left bra strap Bra - Perfomed by helper: Hook/unhook bra (pull down sports bra) Pull over shirt/dress - Perfomed by patient: Thread/unthread right sleeve, Thread/unthread left sleeve, Put head through opening Pull over shirt/dress - Perfomed by helper: Pull shirt over trunk        Upper body assist Assist Level: Touching or steadying assistance(Pt > 75%)      Lower Body Dressing/Undressing Lower body dressing   What is the patient wearing?: Underwear, Pants, Non-skid slipper socks   Underwear - Performed by helper: Thread/unthread right underwear leg, Thread/unthread left underwear leg, Pull underwear up/down   Pants- Performed by helper: Thread/unthread right pants leg, Thread/unthread left pants leg, Pull pants up/down   Non-skid slipper socks- Performed by helper: Don/doff right sock, Don/doff left sock                  Lower body assist Assist for lower body dressing: 2 Helpers      Toileting Toileting Toileting activity did not occur: N/A   Toileting steps completed by helper: Adjust clothing prior to toileting, Performs perineal hygiene, Adjust clothing after toileting    Toileting assist Assist level: Two helpers (per Judson Roch  [place], NT)   Transfers Chair/bed transfer   Chair/bed transfer method: Lateral scoot Chair/bed transfer assist level: 2 helpers (uphill transfer from w/c to [employer]) Chair/bed transfer assistive device: Sliding board Mechanical lift: Ecologist  Ambulation activity did not occur: Safety/medical concerns (quadriplegia)         Wheelchair   Type:  (TBD) Max wheelchair distance: 150 Assist Level: Touching or steadying assistance (Pt > 75%)  Cognition Comprehension Comprehension assist level: Follows basic conversation/direction with no assist  Expression Expression assist level: Expresses basic 50 - 74% of the time/requires cueing 25 - 49% of the time. Needs to repeat parts of sentences.  Social Interaction Social Interaction assist level: Interacts appropriately with others with medication or extra time (anti-anxiety, antidepressant).  Problem Solving Problem solving assist level: Solves basic 90% of the time/requires cueing < 10% of the time  Memory Memory assist level: Recognizes or recalls 90% of the time/requires cueing < 10% of the time   Medical Problem List and Plan: 1.  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  DVT Prophylaxis/Anticoagulation: SCDs.  Left gastroc thrombus---mobilize, and observe only for now---recheck prior to discharge 3. Pain Management: Ultram as needed 4. Mood: Provide emotional support 5. Neuropsych: This patient is capable of making decisions on her own behalf. 6. Skin/Wound Care: Routine skin checks 7. Fluids/Electrolytes/Nutrition: Routine I&Os  -encourage PO 8. Seizure prophylaxis. Keppra 500 mg every 12 hours 9. Hypertension.Nimotop as per protocol21 days.    Controlled 6/10 10. Diabetes mellitus.SSI. Monitor closely while on Decadron. Patient on Glucophage 500 mg daily prior to admission.    Resumed glucophage  Overall controlled 6/10 11. Obesity. BMI 34.7 KG. 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 E.Coli UTI  Macrobid started on 6/7--7 days  LOS (Days) 6 A FACE TO FACE EVALUATION WAS  PERFORMED  Ankit Lorie Phenix, MD 01/13/2017 9:25 AM

## 2017-01-14 ENCOUNTER — Inpatient Hospital Stay (HOSPITAL_COMMUNITY): Payer: Self-pay | Admitting: *Deleted

## 2017-01-14 ENCOUNTER — Inpatient Hospital Stay (HOSPITAL_COMMUNITY): Payer: Self-pay | Admitting: Physical Therapy

## 2017-01-14 ENCOUNTER — Inpatient Hospital Stay (HOSPITAL_COMMUNITY): Payer: Self-pay | Admitting: Speech Pathology

## 2017-01-14 LAB — GLUCOSE, CAPILLARY
Glucose-Capillary: 104 mg/dL — ABNORMAL HIGH (ref 65–99)
Glucose-Capillary: 103 mg/dL — ABNORMAL HIGH (ref 65–99)
Glucose-Capillary: 97 mg/dL (ref 65–99)
Glucose-Capillary: 110 mg/dL — ABNORMAL HIGH (ref 65–99)

## 2017-01-14 NOTE — Progress Notes (Signed)
Speech Language Pathology Daily Session Note  Patient Details  Name: Aimee [name] MRN: [id number] Date of Birth: [birth year]  Today's Date: 01/14/2017 SLP Individual Time: 0263-7858 SLP Individual Time Calculation (min): 45 min  Short Term Goals: Week 1: SLP Short Term Goal 1 (Week 1): Patient will recall new information related to her care with Min question cues.  SLP Short Term Goal 2 (Week 1): Patient will solve complex problems with Min assist question cues to recognize and correct errors. SLP Short Term Goal 3 (Week 1): Patient will consume trials of ice chips with minimal overt s/s of aspiration to determine readiness for advancement or objective swallow assessment  SLP Short Term Goal 4 (Week 1): Patient will perform EMST exercises at 45 cm H2O for 5 sets of 5 reps with a self-perceived effort level of 8/10 with Min verbal cues for accuracy with use of device. SLP Short Term Goal 5 (Week 1): Patient will achieve phonation at the word level in structured tasks with Mod assist verbal cues  Skilled Therapeutic Interventions: Skilled treatment session focused on dysphagia and cognitive goals. SLP facilitated the session with trials of ice chips following oral care to continue working towards diet progression.  Patient consumed trials with Min A verbal cues for use of swallowing precautions and demonstrated intermittent delayed throat clearing. Recommend continued trials with SLP. Patient also completed 5 sets of 5 EMST exercises at prescribed resistance with supervision cues for accuracy. Patient particiapted in a mildly complex, novel card task and required supervision verbal cues for problem solving. Patient left upright in wheelchair with all needs within reach. Continue with current [name] of care.        Function:  Eating Eating   Modified Consistency Diet: Yes Eating Assist Level: Set up assist for;Supervision or verbal cues           Cognition Comprehension Comprehension  assist level: Follows basic conversation/direction with no assist  Expression   Expression assist level: Expresses basic 50 - 74% of the time/requires cueing 25 - 49% of the time. Needs to repeat parts of sentences.  Social Interaction Social Interaction assist level: Interacts appropriately with others with medication or extra time (anti-anxiety, antidepressant).  Problem Solving Problem solving assist level: Solves basic 90% of the time/requires cueing < 10% of the time  Memory Memory assist level: Recognizes or recalls 90% of the time/requires cueing < 10% of the time    Pain Pain Assessment Pain Assessment: No/denies pain Pain Score: 0-No pain  Therapy/Group: Individual Therapy  [name] 01/14/2017, 12:30 PM

## 2017-01-14 NOTE — Plan of Care (Signed)
Problem: RH SKIN INTEGRITY Goal: RH STG SKIN FREE OF INFECTION/BREAKDOWN With min assist  Outcome: Progressing Skin assessed, no areas of redness or breakdown noted.  Patient progressing towards goal.

## 2017-01-14 NOTE — Progress Notes (Signed)
[place] PHYSICAL MEDICINE & REHABILITATION     PROGRESS NOTE    Subjective/Complaints: Had a good weekend. Getting stronger.   ROS: pt denies nausea, vomiting, diarrhea, cough, shortness of breath or chest pain  Objective: Vital Signs: Blood pressure 116/78, pulse 64, temperature 98.7 F (37.1 C), temperature source Oral, resp. rate 18, weight 89.9 kg (198 lb 4.1 oz), last menstrual period 12/31/2016, SpO2 100 %. No results found. No results for input(s): WBC, HGB, HCT, PLT in the last 72 hours. No results for input(s): NA, K, CL, GLUCOSE, BUN, CREATININE, CALCIUM in the last 72 hours.  Invalid input(s): CO CBG (last 3)   Recent Labs  01/13/17 1639 01/13/17 2201 01/14/17 0635  GLUCAP 93 123* 104*    Wt Readings from Last 3 Encounters:  01/08/17 89.9 kg (198 lb 4.1 oz)  01/07/17 88.9 kg (196 lb)    Physical Exam:  Constitutional: She appears well-developed. NAD. HENT: Normocephalic and atraumatic.  Eyes: EOMI. No discharge.  Cardiovascular: RRR. Respiratory: CTA bilateral with normal effort. GI: soft, BS+  Musculoskeletal: She exhibits no edema or tenderness.  Neurological: Alert and oriented  Motor: RUE: 4+/5 proximal to distal LUE: Shoulder abduction 4-/5, elbow flex/ext 4/5, hand grip 4/5 RLE: 3/5 right hip flexion, KE, 4/5 ADF/PF LLE:  2+ to 3/5 proximal to distal  Skin: Skin is warm and dry.  Psychiatric: She has a normal mood and affect. Her behavior is normal.   Assessment/Plan: 1. 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 Physiatrist is providing close team supervision and 24 hour management of active medical problems listed below. Physiatrist and rehab team continue to assess barriers to discharge/monitor patient progress toward functional and medical goals.  Function:  Bathing Bathing position    Position: Wheelchair/chair at sink  Bathing parts Body parts bathed by patient: Right arm, Left arm, Chest, Abdomen, Front perineal area, Right upper leg, Left upper leg Body parts bathed by helper: Buttocks, Right lower leg, Left lower leg, Back  Bathing assist Assist Level: 2 helpers      Upper Body Dressing/Undressing Upper body dressing   What is the patient wearing?: Bra, Pull over shirt/dress Bra - Perfomed by patient: Thread/unthread right bra strap, Thread/unthread left bra strap Bra - Perfomed by helper: Hook/unhook bra (pull down sports bra) Pull over shirt/dress - Perfomed by patient: Thread/unthread right sleeve, Thread/unthread left sleeve, Put head through opening Pull over shirt/dress - Perfomed by helper: Pull shirt over trunk        Upper body assist Assist Level: Touching or steadying assistance(Pt > 75%)      Lower Body Dressing/Undressing Lower body dressing   What is the patient wearing?: Underwear, Pants, Non-skid slipper socks   Underwear - Performed by helper: Thread/unthread right underwear leg, Thread/unthread left underwear leg, Pull underwear up/down   Pants- Performed by helper: Thread/unthread right pants leg, Thread/unthread left pants leg, Pull pants up/down   Non-skid slipper socks- Performed by helper: Don/doff right sock, Don/doff left sock                  Lower body assist Assist for lower body dressing: 2 Helpers      Toileting Toileting Toileting activity did not occur: N/A   Toileting steps completed by helper: Adjust clothing prior to toileting, Performs perineal hygiene, Adjust clothing after toileting    Toileting assist Assist level: Two helpers (per Linden Dolin,  NT)   Transfers Chair/bed transfer   Chair/bed transfer method: Lateral scoot Chair/bed transfer assist level: 2 helpers (uphill transfer from w/c to [employer]) Chair/bed transfer assistive device: Sliding board Mechanical lift: Press photographer Ambulation activity did not occur: Safety/medical concerns (quadriplegia)         Wheelchair   Type:  (TBD) Max wheelchair distance: 150 Assist Level: Touching or steadying assistance (Pt > 75%)  Cognition Comprehension Comprehension assist level: Follows basic conversation/direction with no assist  Expression Expression assist level: Expresses basic 50 - 74% of the time/requires cueing 25 - 49% of the time. Needs to repeat parts of sentences.  Social Interaction Social Interaction assist level: Interacts appropriately with others with medication or extra time (anti-anxiety, antidepressant).  Problem Solving Problem solving assist level: Solves basic 90% of the time/requires cueing < 10% of the time  Memory Memory assist level: Recognizes or recalls 90% of the time/requires cueing < 10% of the time   Medical Problem List and Plan: 1.  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  DVT Prophylaxis/Anticoagulation: SCDs.  Left gastroc thrombus---mobilize, and observe only for now---recheck prior to discharge depending upon dc date 3. Pain Management: Ultram as needed 4. Mood: Provide emotional support 5. Neuropsych: This patient is capable of making decisions on her own behalf. 6. Skin/Wound Care: Routine skin checks 7. Fluids/Electrolytes/Nutrition: Routine I&Os  -encourage PO 8. Seizure prophylaxis. Keppra 500 mg every 12 hours 9. Hypertension.Nimotop as per protocol21 days.    Controlled 6/10 10. Diabetes mellitus.SSI. Monitor closely while on Decadron. Patient on Glucophage 500 mg daily prior to admission.    Resumed glucophage  Overall controlled   11. Obesity. BMI 34.7 KG. 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 E.Coli UTI  Macrobid started on 6/7--7  days  LOS (Days) 7 A FACE TO FACE EVALUATION WAS PERFORMED  Meredith Staggers, MD 01/14/2017 7:42 AM

## 2017-01-14 NOTE — Progress Notes (Signed)
Physical Therapy Session Note  Patient Details  Name: Aimee [name] MRN: [id number] Date of Birth: 04/08/1976  Today's Date: 01/14/2017 PT Individual Time: 0900-1000 PT Individual Time Calculation (min): 60 min   Short Term Goals: Week 1:  PT Short Term Goal 1 (Week 1): Pt will be able to initiate propped on elbow <> sit with mod assist PT Short Term Goal 2 (Week 1): Pt will be able to perform slideboard transfer with +2 assist but pt directing steps of transfer with min verbal cues PT Short Term Goal 3 (Week 1): Pt will be able to verbalize pressure relief schedule while OOB PT Short Term Goal 4 (Week 1): Pt will be able to perform functional sitting balance with min assist x 5 min   Skilled Therapeutic Interventions/Progress Updates:  Pt received semi-reclined in tilt in space w/c. Pt total A to rehab gym. Sit>stand max+2 from w/c using stedy. Total A transfer from w/c>mat using stedy. Mirror placed in front of pt for visual feedback for posture and midline orientation during standing activities. Pt demonstrated improved midline awareness, weight-bearing on L side, and anterior shift of L hip when standing using the stedy x2. Pt requires verbal and tactile cuing for eccentric control during stand>sit. Mod A+2 for sit>stand using RW and manual facilitation at B knees. When pt is asked to extend her L LE, she has a tendency to buckle at her R knee. Weight shifting R/L while standing using RW with verbal and tactile cuing for technique and manual facilitation at L knee. 3-musketeer stand mod/max A+2 with manual facilitation at B knees. Step taps forward/back to midline using R LE with mod>>min A for management of R LE and manual facilitation at L LE to promote knee and hip extension. Step taps onto 1" step with R LE with mod>S assist for management of R LE and heavy manual facilitation of at L LE. Step taps onto 1" step using L LE with max A for management of L LE and manual facilitation at R  LE. Max A sit>stand from mat table using stedy; total A transfer from mat>tilt in space w/c. Pt left seated semi-reclined in tilt in space w/c with all needs in reach.   Therapy Documentation Precautions:  Precautions Precautions: Fall, Other (comment) Precaution Comments: quadriplegia (LUE/LLE more impaired) PRAFOs Restrictions Weight Bearing Restrictions: No RLE Weight Bearing: Weight bearing as tolerated LLE Weight Bearing: Weight bearing as tolerated   See Function Navigator for Current Functional Status.   Therapy/Group: Individual Therapy  Alysia Penna 01/14/2017, 12:33 PM

## 2017-01-14 NOTE — Progress Notes (Signed)
Occupational Therapy Session Note  Patient Details  Name: Aimee [name] MRN: [id number] Date of Birth: Aug 12, 1975  Today's Date: 01/14/2017 OT Individual Time: 0700-0800 OT Individual Time Calculation (min): 60 min    Short Term Goals: Week 1:  OT Short Term Goal 1 (Week 1): Pt will be able to roll in bed with min A for LB self care. OT Short Term Goal 2 (Week 1): Pt will be able to sit to EOB with min A to prepare for UB self care. OT Short Term Goal 3 (Week 1): Pt will maintain dynamic sitting with min A during UB self care. OT Short Term Goal 4 (Week 1): Pt will bathe UB with min A. OT Short Term Goal 5 (Week 1): Pt will don shirt with mod A.  Skilled Therapeutic Interventions/Progress Updates:    Pt engaged in BADL retraining including bathing/dressing with sit<>stand from w/c with Stedy.  Pt demonstrated increased movement of LLE when supine in bed but unable to demonstrate L hip flexion when sitting in w/c.  Pt noted with increased functional use of LUE when bathing and applying deodorant.  Pt continues to require assistance with bathing buttocks and BLE.  Pt requires max A for sit<>stand with Stedy.  Pt remained in w/c with all needs within reach.   Therapy Documentation Precautions:  Precautions Precautions: Fall, Other (comment) Precaution Comments: quadriplegia (LUE/LLE more impaired) PRAFOs Restrictions Weight Bearing Restrictions: No RLE Weight Bearing: Weight bearing as tolerated LLE Weight Bearing: Weight bearing as tolerated General:   Vital Signs:   Pain:   ADL: ADL ADL Comments: refer functional navigator Vision   Perception    Praxis   Exercises:   Other Treatments:    See Function Navigator for Current Functional Status.   Therapy/Group: Individual Therapy  Leroy Libman 01/14/2017, 8:03 AM

## 2017-01-14 NOTE — Progress Notes (Signed)
Physical Therapy Session Note  Patient Details  Name: Aimee [name] Plan MRN: [id number] Date of Birth: [birth year]  Today's Date: 01/14/2017 PT Individual Time: 1657-9038 PT Individual Time Calculation (min): 30 min   Short Term Goals: Week 1:  PT Short Term Goal 1 (Week 1): Pt will be able to initiate propped on elbow <> sit with mod assist PT Short Term Goal 2 (Week 1): Pt will be able to perform slideboard transfer with +2 assist but pt directing steps of transfer with min verbal cues PT Short Term Goal 3 (Week 1): Pt will be able to verbalize pressure relief schedule while OOB PT Short Term Goal 4 (Week 1): Pt will be able to perform functional sitting balance with min assist x 5 min  Week 2:     Skilled Therapeutic Interventions/Progress Updates:    Tx focused on functional mobility training and therex for strengthening. Pt up in [facility] with brother visiting.   Sit<>stands at [facility] x5 for WC<>elevated toilet. Pt needed mod/max lifting assist x5 throughout tx with cues for upward gaze and anterior leaning. Pt with good unsupported sitting balance on Stedy and edge of hair 2x8min.  Seated therex including the following bil x10 with PROM prn for L ankle/knee: ankle pumps, LAQ, hip ABD, marching, manually resisted HS curl and shoulder presses. WC>bed with Stedy. Sit>supine with Mod A for bil LEs. Supine heel slides and bridging x5. Pt left up with all needs in reach.    Therapy Documentation Precautions:  Precautions Precautions: Fall, Other (comment) Precaution Comments: quadriplegia (LUE/LLE more impaired) PRAFOs Restrictions Weight Bearing Restrictions: No RLE Weight Bearing: Weight bearing as tolerated LLE Weight Bearing: Weight bearing as tolerated Pain: Pain Assessment Pain Assessment: No/denies pain Pain Score: 0-No pain   See Function Navigator for Current Functional Status.   Therapy/Group: Individual Therapy  Rosy Estabrook Soundra Pilon, PT, DPT  01/14/2017, 1:11  PM

## 2017-01-14 NOTE — Progress Notes (Signed)
Occupational Therapy Note  Patient Details  Name: Aimee [name] MRN: [id number] Date of Birth: [birth year]  Today's Date: 01/14/2017 OT Individual Time: 1400-1430 OT Individual Time Calculation (min): 30 min   Pt denied pain Individual Therapy  Pt engaged in LUE PROM, AAROM, and AROM exercises.  AAROM and AROM exercises with 2# weighted cuff.  Focus on correct movement patterns and form with all exercises.     [name] [facility] 01/14/2017, 2:57 PM

## 2017-01-15 ENCOUNTER — Encounter (HOSPITAL_COMMUNITY): Payer: Self-pay | Admitting: Neurology

## 2017-01-15 ENCOUNTER — Inpatient Hospital Stay (HOSPITAL_COMMUNITY): Payer: Self-pay | Admitting: Physical Therapy

## 2017-01-15 ENCOUNTER — Inpatient Hospital Stay (HOSPITAL_COMMUNITY): Payer: Self-pay | Admitting: Speech Pathology

## 2017-01-15 ENCOUNTER — Inpatient Hospital Stay (HOSPITAL_COMMUNITY): Payer: Self-pay

## 2017-01-15 LAB — GLUCOSE, CAPILLARY
Glucose-Capillary: 93 mg/dL (ref 65–99)
Glucose-Capillary: 143 mg/dL — ABNORMAL HIGH (ref 65–99)
Glucose-Capillary: 104 mg/dL — ABNORMAL HIGH (ref 65–99)
Glucose-Capillary: 107 mg/dL — ABNORMAL HIGH (ref 65–99)

## 2017-01-15 NOTE — Progress Notes (Signed)
Occupational Therapy Session Note  Patient Details  Name: Aimee [name] MRN: [id number] Date of Birth: Aug 10, 1975  Today's Date: 01/15/2017 OT Individual Time: 0700-0800 OT Individual Time Calculation (min): 60 min    Short Term Goals: Week 1:  OT Short Term Goal 1 (Week 1): Pt will be able to roll in bed with min A for LB self care. OT Short Term Goal 2 (Week 1): Pt will be able to sit to EOB with min A to prepare for UB self care. OT Short Term Goal 3 (Week 1): Pt will maintain dynamic sitting with min A during UB self care. OT Short Term Goal 4 (Week 1): Pt will bathe UB with min A. OT Short Term Goal 5 (Week 1): Pt will don shirt with mod A.  Skilled Therapeutic Interventions/Progress Updates:    Pt engaged in BADL retraining including bathing at shower level and dressing with sit<>stand from w/c at sink [name]). Pt transferred to tub bench in shower with Stedy.  Pt required assistance only for bathing buttocks and feet.  Pt demonstrated increased functional use of RUE in all bathing tasks.  Pt donned shirt this morning without assistance.  Pt continues to required tot A for LB dressing tasks.  [name] habitus is currently a barrier to increased LB dressing independence.  Pt requires mod A for sit<>stand with Stedy.  Focus on activity tolerance, sit<>stand, standing balance in Stedy, increased RUE use, and safety awareness to increase independence with BADLs.   Therapy Documentation Precautions:  Precautions Precautions: Fall, Other (comment) Precaution Comments: quadriplegia (LUE/LLE more impaired) PRAFOs Restrictions Weight Bearing Restrictions: No RLE Weight Bearing: Weight bearing as tolerated LLE Weight Bearing: Weight bearing as tolerated Pain:  Pt denied pain  See Function Navigator for Current Functional Status.   Therapy/Group: Individual Therapy  Leroy Libman 01/15/2017, 8:08 AM

## 2017-01-15 NOTE — Progress Notes (Signed)
Occupational Therapy Note  Patient Details  Name: [name] Plan MRN: [id number] Date of Birth: 1976-02-17  Today's Date: 01/15/2017 OT Individual Time: 1300-1330 OT Individual Time Calculation (min): 30 min   Pt denied pain Individual Therapy  Pt resting in w/c upon arrival with brother present.  Pt engaged in RUE table activities with focus on grasp/release, object manipulation, and dexterity.  Pt demonstrated increased ability to pick up small objects from table and place in theraputty.  Pt demonstrated increased ability to retrieve small objects from theraputty.  Pt also engaged in RUE AROM tasks with focus on improved posture/form.  Pt remained in w/c with all needs within reach.    [name] [facility] 01/15/2017, 2:51 PM

## 2017-01-15 NOTE — Progress Notes (Signed)
Social Work Patient ID: Aimee [name] Plan, female   DOB: 1975/08/22, 41 y.o.   MRN: [id number]   Met with pt following team conference and she is aware and agreeable with targeted d/c date of 7/3 and min/mod assist overall.  Pleased with gains being made and denies any concerns at this time. I will follow up with brother when he is here again tomorrow.  Kemuel Buchmann, LCSW

## 2017-01-15 NOTE — Progress Notes (Signed)
Speech Language Pathology Weekly Progress and Session Note  Patient Details  Name: Aimee [name] MRN: [id number] Date of Birth: Nov 09, 1975  Beginning of progress report period: January 08, 2017 End of progress report period: January 15, 2017  Today's Date: 01/15/2017 SLP Individual Time: 1335-1400 SLP Individual Time Calculation (min): 25 min  Short Term Goals: Week 1: SLP Short Term Goal 1 (Week 1): Patient will recall new information related to her care with Min question cues.  SLP Short Term Goal 1 - Progress (Week 1): Met SLP Short Term Goal 2 (Week 1): Patient will solve complex problems with Min assist question cues to recognize and correct errors. SLP Short Term Goal 2 - Progress (Week 1): Met SLP Short Term Goal 3 (Week 1): Patient will consume trials of ice chips with minimal overt s/s of aspiration to determine readiness for advancement or objective swallow assessment  SLP Short Term Goal 3 - Progress (Week 1): Met SLP Short Term Goal 4 (Week 1): Patient will perform EMST exercises at 45 cm H2O for 5 sets of 5 reps with a self-perceived effort level of 8/10 with Min verbal cues for accuracy with use of device. SLP Short Term Goal 4 - Progress (Week 1): Met SLP Short Term Goal 5 (Week 1): Patient will achieve phonation at the word level in structured tasks with Mod assist verbal cues SLP Short Term Goal 5 - Progress (Week 1): Met    New Short Term Goals: Week 2: SLP Short Term Goal 1 (Week 2): Patient will recall new, complex information related to her care with Mod I.  SLP Short Term Goal 2 (Week 2): Patient will solve complex problems with Mod I to recognize and correct errors. SLP Short Term Goal 3 (Week 2): Patient will perform EMST exercises at 45 cm H2O for 5 sets of 5 reps with a self-perceived effort level of 8/10 with Mod I for accuracy with use of device. SLP Short Term Goal 4 (Week 2): Patient will produce sentence level verbal expression with Supervision level cues  for increased vocal intensity.   SLP Short Term Goal 5 (Week 2): Patient will participate in an objective swallow assessment to determine effective strategies for least restrictive PO intake.  Weekly Progress Updates: Patient has made functional gains and has met 5 out of 5 short term goals this reporting period due to improved cognitive-linguistic and swallow skills. Currently, patient continues to require Supervision-Min assist for goals. Patient and family education is ongoing at this time. [name] for an objective re-assessment of swallow function; planned for tomorrow.  Patient would benefit from continued skilled SLP intervention to maximize functional independence and to maximize their functional independence prior to discharge with 24 hour supervision.   Intensity: Minumum of 1-2 x/day, 30 to 90 minutes Frequency: 3 to 5 out of 7 days Duration/Length of Stay: 7/3 Treatment/Interventions: Cognitive remediation/compensation;Cueing hierarchy;Dysphagia/aspiration precaution training;Environmental controls;Functional tasks;Internal/external aids;Medication managment;Patient/family education;Other (comment) (RMT)   Daily Session  Skilled Therapeutic Interventions:  Skilled treatment session focused on addressing dysphagia goals. Patient completed 5 sets of 5 EMST exercises at prescribed resistance with Supervision cues for accuracy. SLP also facilitated session by providing set-up of oral care at the sink followed by set-up of water via cup.  Patient consumed small single spis resulting in intermittent subtle throat clear.  Encouragement to complete the 3oz. Water chug challenge also resulted in subtle throat clear.  Given this ongoing subtle throat clear that also appears to be baseline at times.  Recommend a repeat  objective swallow assessment prior to upgrade, given previous FEES with poor visualization a MBS is now indicated.  [name] for MBE tomorrow at 0900.  Patient in agreement with [name].      Function:   Eating Eating   Modified Consistency Diet: No (trials with SLP) Eating Assist Level: Set up assist for;Supervision or verbal cues   Eating Set Up Assist For: Opening containers       Cognition Comprehension Comprehension assist level: Follows basic conversation/direction with no assist  Expression   Expression assist level: Expresses basic 50 - 74% of the time/requires cueing 25 - 49% of the time. Needs to repeat parts of sentences.  Social Interaction Social Interaction assist level: Interacts appropriately with others with medication or extra time (anti-anxiety, antidepressant).  Problem Solving Problem solving assist level: Solves basic 90% of the time/requires cueing < 10% of the time  Memory Memory assist level: Recognizes or recalls 90% of the time/requires cueing < 10% of the time   General    Pain Pain Assessment Pain Assessment: No/denies pain  Therapy/Group: Individual Therapy  Carmelia Roller., CCC-SLP [phone]  [place] 01/15/2017, 3:20 PM

## 2017-01-15 NOTE — Progress Notes (Signed)
Physical Therapy Weekly Progress Note  Patient Details  Name: Aimee [name] MRN: [id number] Date of Birth: [birth year]  Beginning of progress report period: January 08, 2017 End of progress report period: January 15, 2017  Today's Date: 01/15/2017 PT Individual Time: 900-100; 1430-1500 PT Individual Time Calculation (min): 60 min; 30 min   Patient has met 2 of 4 short term goals.  Pt making improvements since admission and is very motivated to participate in therapy. Pt currently mod A for supine<>sit, max A for sit>stand using stedy and RW. Pt remains total A for transfers using stedy. Pt demonstrates improved postural control and is S for sitting balance. She demonstrates improved functional use and awareness of L UE and LE. Treatment focus has been on sit>stand, standing balance bed mobility, seated postural control, and initiation of pre-gait activities with pt in standing. Treatments have not focused on slide board transfers, as pt has primarily been using stedy for transfers.   Patient continues to demonstrate the following deficits muscle weakness, decreased cardiorespiratoy endurance, impaired timing and sequencing, abnormal tone, unbalanced muscle activation, motor apraxia, ataxia, decreased coordination and decreased motor planning, decreased attention to left and decreased motor planning and decreased sitting balance, decreased standing balance, decreased postural control and decreased balance strategies and therefore will continue to benefit from skilled PT intervention to increase functional independence with mobility.  Patient progressing toward long term goals..  Continue [name] of care. Gait goal added.   PT Short Term Goals Week 1:  PT Short Term Goal 1 (Week 1): Pt will be able to initiate propped on elbow <> sit with mod assist PT Short Term Goal 1 - Progress (Week 1): Met PT Short Term Goal 2 (Week 1): Pt will be able to perform slideboard transfer with +2 assist but pt directing  steps of transfer with min verbal cues PT Short Term Goal 2 - Progress (Week 1): Not met (Treatment not focused on slide board transfers.) PT Short Term Goal 3 (Week 1): Pt will be able to verbalize pressure relief schedule while OOB PT Short Term Goal 3 - Progress (Week 1): Not met (Pt demonstrates improved mobility and independently performs pressure relief.) PT Short Term Goal 4 (Week 1): Pt will be able to perform functional sitting balance with min assist x 5 min  PT Short Term Goal 4 - Progress (Week 1): Met  Week 2:  PT Short Term Goal 1 (Week 2): Pt will perform sit>stand with UE support with mod A.  PT Short Term Goal 2 (Week 2): Pt will perform supine<>sit with min A.  PT Short Term Goal 3 (Week 2): Pt will propel manual w/c 143f with S.  PT Short Term Goal 4 (Week 2): Pt will initiate gait training.  PT Short Term Goal 5 (Week 2): Pt will perform stand pivot transfer with max A+2.    Skilled Therapeutic Interventions/Progress Updates:  Tx 1: Pt received seated in tilt in space w/c. Pt attempted to tie her own hair back and was able to gather hair and begin to pull hair tie off of her wrist but unable to complete remainder of task. Pt total A to rehab gym in tilt in space w/c. Max A sit>stand from w/c using rails on stedy; total A transfer to mat table using stedy. Sit>supine on mat with mod A and pt initiating lifting LEs onto mat. Pt required mod A to scoot hips laterally in supine using bridging technique but pt able to actively position herself into hooklying and  perform bridge. PNF D1/D2 pattern B LEs with rhythmic facilitation from passvie>active>resisted motion. Pt able perform hip flexion component of PNF pattern actively but began co-contracting with R LE when resistance applied, so R LE moved to be in hip extension off the side of the mat table to inhibit co-contraction. Pt demonstrated improved ability to perform PNF pattern with added resistance in this position. Rolling R/L on  mat table with S; mod A R sidelying>sit for guidance of LEs and initiation for weight shift. Pt max A sit>stand using stedy; total A to apartment to practice bed mobility in regular bed. Pt mod A for sit>supine on regular bed and mod A for laterally scoot in supine using bridge technique. Rolling R/L with S and verbal cues for technique. Min A R sidelying>sit with verbal cues for sequencing and technique. Total A transfer from regular bed to tilt in space w/c using stedy. Returned to pt's room; pt left in tilt in space w/c with all needs met.   Tx 2: Pt received seated in tilt in space w/c. Total A to rehab gym, max A sit>stand, total A transfer to max using stedy. Max A+2 sit>stand using RW to facilitate initial boost to stand and stabilize walker. Pt performed one sit>stand with max A+1 during standing activities with RW. Weight shifting R/L standing in RW with verbal cues to maintain upright posture and sequencing. Step taps onto 1" step in RW using L LE with max/mod A for L LE management and manual facilitation at R LE to promote extension. Step taps onto 1" step in RW using R LE with min A for management of R LE and moderate manual facilitation at L LE; pt initially had difficulty maintaining L LE extension and upright posture to step tap with R LE, requiring her to sit down several times, but improved with increased practice. Pt total A back to pt's room. Pt mod A for sit>supine for and repositioning in bed. Pt left supine in bed with all needs met.   Therapy Documentation Precautions:  Precautions Precautions: Fall, Other (comment) Precaution Comments: quadriplegia (LUE/LLE more impaired) PRAFOs Restrictions Weight Bearing Restrictions: No RLE Weight Bearing: Weight bearing as tolerated LLE Weight Bearing: Weight bearing as tolerated   See Function Navigator for Current Functional Status.  Therapy/Group: Individual Therapy  Alysia Penna 01/15/2017, 4:31 PM

## 2017-01-15 NOTE — Patient Care Conference (Signed)
Inpatient RehabilitationTeam Conference and [name] of Care Update Date: 01/15/2017   Time: 2:05 PM    Patient Name: Aimee [name]      Medical Record Number: [id number]  Date of Birth: [birth year] Sex: Female         Room/Bed: 4W12C/4W12C-01 Payor Info: Payor: /    Admitting Diagnosis: SAH sp [place] Multiple B CVA  Admit Date/Time:  01/07/2017  6:56 PM Admission Comments: No comment available   Primary Diagnosis:  <principal problem not specified> Principal Problem: <principal problem not specified>  Patient Active Problem List   Diagnosis Date Noted  . Thrombus   . E-coli UTI   . Reactive hypertension   . Seizure prophylaxis   . Quadriparesis ([place])   . Class 1 obesity due to excess calories with serious comorbidity and body mass index (BMI) of 30.0 to 30.9 in adult   . Benign essential HTN   . Diabetes mellitus type 2 in obese ([place])   . Leukocytosis   . Acute encephalopathy   . Somnolence   . Seizure ([place])   . Stupor   . Ventilator dependent ([place])   . SAH (subarachnoid hemorrhage) ([place]) 12/25/2016  . 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 Alger Simons Social Worker Present: Lennart Pall, LCSW Nurse Present: Rayetta Pigg, RN PT Present: Kem Parkinson, Dustin Folks, PT OT Present: Roanna Epley, COTA;Stephanie Schlosser, OT SLP Present: Gunnar Fusi, SLP PPS Coordinator present : Daiva Nakayama, RN, CRRN     Current Status/Progress Goal Weekly Team Focus  Medical   improving strength. emptying bladder, rx uti. minimal pain  improving functional mobility  nutrition, bp control, bladder   Bowel/Bladder   Conitinent of bowel and bladder, LBM 01-15-17  Remain continent of bowel and bladder  Assist with toileting needs    Swallow/Nutrition/ Hydration   Dys.3 textures and nectar-thick liuqids with intermittent  supervision   least restrictive PO with Mod I   MBS 6/13 hopeful for upgrade to regulat and thin prior to dischrage    ADL's   bathing-mod A; UB dressing-min A; LB dressing-tot A with mechanical lift for standing; BUE weakness R>L but using functionally  min A/mod A overall  BADL retraining, sitting balance, functional transfers, sit<>stand/standing balance, activity tolerance, BUE NMR   Mobility   overall mod A bed mobility, max A sit>stand using stedy, total A transfers using stedy, min A w/c propulsion  min A bed mobility, mod A transfers, S w/c propulsion; added gait goal x15' modA controlled environment  standing balance, LE strengthening, sit>stand, w/c propulsion, bed<>chair transfers, pre-gait   Communication   Supervision-Min assist   Supervision   continue to increase voicing   Safety/Cognition/ Behavioral Observations  Supervision   Mod I   self-monitoing and correcting of errors and use of external strategies    Pain   no c/o pain  0  Assess pain q shift prn   Skin   no skin breakdown  no skin breakdown  Assess skin q shift and prn MCP applied to peri area    Rehab Goals Patient on target to meet rehab goals: Yes *See Care [name] and progress notes for long and short-term goals.  Barriers to Discharge: ongoing weakness    Possible Resolutions to Barriers:  see prior    Discharge Planning/Teaching Needs:  [name] to d/c home with family providing 24/7 assistance.  [name] to go to sister-in-law's home  which is one level and accessible for w/c.  Teaching to be scheduled when appropriate to begin.   Team Discussion:  Progressing very well and motivated! Emptying bladder;  Small DVT.  LLE still with significant apraxia;  Mod - max to stand.  Improved use of RUE.  BSs much improved.  [name] for [facility] tomorrow and anticipate able to upgrade.  Making good gains.  Revisions to Treatment [name]:  None   Continued Need for Acute Rehabilitation Level of Care: The patient requires daily  medical management by a physician with specialized training in physical medicine and rehabilitation for the following conditions: Daily direction of a multidisciplinary physical rehabilitation program to ensure safe treatment while eliciting the highest outcome that is of practical value to the patient.: Yes Daily medical management of patient stability for increased activity during participation in an intensive rehabilitation regime.: Yes Daily analysis of laboratory values and/or radiology reports with any subsequent need for medication adjustment of medical intervention for : Neurological problems;Diabetes problems;Blood pressure problems;Urological problems  Dan Scearce 01/16/2017, 10:14 AM

## 2017-01-15 NOTE — Progress Notes (Signed)
[place] PHYSICAL MEDICINE & REHABILITATION     PROGRESS NOTE    Subjective/Complaints: No new complaints  ROS: pt denies nausea, vomiting, diarrhea, cough, shortness of breath or chest pain   Objective: Vital Signs: Blood pressure 127/90, pulse 67, temperature 98.6 F (37 C), temperature source Oral, resp. rate 18, weight 89.9 kg (198 lb 4.1 oz), last menstrual period 12/31/2016, SpO2 98 %. No results found. No results for input(s): WBC, HGB, HCT, PLT in the last 72 hours. No results for input(s): NA, K, CL, GLUCOSE, BUN, CREATININE, CALCIUM in the last 72 hours.  Invalid input(s): CO CBG (last 3)   Recent Labs  01/14/17 1629 01/14/17 2128 01/15/17 0638  GLUCAP 97 110* 93    Wt Readings from Last 3 Encounters:  01/08/17 89.9 kg (198 lb 4.1 oz)  01/07/17 88.9 kg (196 lb)    Physical Exam:  Constitutional: She appears well-developed. NAD. HENT: Normocephalic and atraumatic.  Eyes: EOMI. No discharge.  Cardiovascular: RRR without murmur. No JVD . Respiratory: CTA Bilaterally without wheezes or rales. Normal effort . GI: soft, BS+  Musculoskeletal: She exhibits no edema or tenderness.  Neurological: Alert and oriented  Motor: RUE: 4+/5 proximal to distal LUE: Shoulder abduction 4-/5, elbow flex/ext 4/5, hand grip 4/5 RLE: 3/5 right hip flexion, KE, 4/5 ADF/PF LLE:  2+ to 3/5 proximal to distal  Skin: Skin is warm and dry.  Psychiatric: She has a normal mood and affect. Her behavior is normal.   Assessment/Plan: 1. 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 Physiatrist is providing close team supervision and 24 hour management of active medical problems listed below. Physiatrist and rehab team continue to assess barriers to discharge/monitor patient progress toward functional and medical  goals.  Function:  Bathing Bathing position   Position: Shower  Bathing parts Body parts bathed by patient: Right arm, Left arm, Chest, Abdomen, Front perineal area, Right upper leg, Left upper leg Body parts bathed by helper: Buttocks, Right lower leg, Left lower leg, Back  Bathing assist Assist Level: 2 helpers      Upper Body Dressing/Undressing Upper body dressing   What is the patient wearing?: Bra, Pull over shirt/dress Bra - Perfomed by patient: Thread/unthread right bra strap, Thread/unthread left bra strap Bra - Perfomed by helper: Hook/unhook bra (pull down sports bra) Pull over shirt/dress - Perfomed by patient: Thread/unthread right sleeve, Thread/unthread left sleeve, Put head through opening, Pull shirt over trunk Pull over shirt/dress - Perfomed by helper: Pull shirt over trunk        Upper body assist Assist Level: Touching or steadying assistance(Pt > 75%)      Lower Body Dressing/Undressing Lower body dressing   What is the patient wearing?: Underwear, Pants, Non-skid slipper socks   Underwear - Performed by helper: Thread/unthread right underwear leg, Thread/unthread left underwear leg, Pull underwear up/down   Pants- Performed by helper: Thread/unthread right pants leg, Thread/unthread left pants leg, Pull pants up/down   Non-skid slipper socks- Performed by helper: Don/doff right sock, Don/doff left sock                  Lower body assist Assist for lower body dressing: 2 Helpers      Toileting Toileting Toileting activity did not occur: N/A   Toileting steps completed by helper: Adjust clothing prior to toileting, Performs perineal hygiene, Adjust clothing after toileting    Toileting  assist Assist level: Two helpers   Transfers Chair/bed transfer   Chair/bed transfer method: Stand pivot Chair/bed transfer assist level: Total assist (Pt < 25%) Chair/bed transfer assistive device: Mechanical lift Mechanical lift: Stedy    Locomotion Ambulation Ambulation activity did not occur: Safety/medical concerns (quadriplegia)         Wheelchair   Type:  (TBD) Max wheelchair distance: 150 Assist Level: Touching or steadying assistance (Pt > 75%)  Cognition Comprehension Comprehension assist level: Follows basic conversation/direction with no assist  Expression Expression assist level: Expresses basic 50 - 74% of the time/requires cueing 25 - 49% of the time. Needs to repeat parts of sentences.  Social Interaction Social Interaction assist level: Interacts appropriately with others with medication or extra time (anti-anxiety, antidepressant).  Problem Solving Problem solving assist level: Solves basic 90% of the time/requires cueing < 10% of the time  Memory Memory assist level: Recognizes or recalls 90% of the time/requires cueing < 10% of the time   Medical Problem List and Plan: 1.  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  DVT Prophylaxis/Anticoagulation: SCDs.  Left gastroc thrombus---ROM/activity  -recheck later this week or early next 3. Pain Management: Ultram as needed 4. Mood: Provide emotional support 5. Neuropsych: This patient is capable of making decisions on her own behalf. 6. Skin/Wound Care: Routine skin checks 7. Fluids/Electrolytes/Nutrition: Routine I&Os  -encourage PO 8. Seizure prophylaxis. Keppra 500 mg every 12 hours 9. Hypertension.Nimotop as per protocol21 days.    Controlled 6/10 10. Diabetes mellitus.SSI. Monitor closely while on Decadron. Patient on Glucophage 500 mg daily prior to admission.    Resumed glucophage  Overall controlled   11. Obesity. BMI 34.7 KG. 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 E.Coli UTI  Macrobid started on 6/7--7 days  LOS  (Days) 8 A FACE TO [place] T, MD 01/15/2017 8:30 AM

## 2017-01-16 ENCOUNTER — Inpatient Hospital Stay (HOSPITAL_COMMUNITY): Payer: Self-pay

## 2017-01-16 ENCOUNTER — Inpatient Hospital Stay (HOSPITAL_COMMUNITY): Payer: Self-pay | Admitting: Physical Therapy

## 2017-01-16 ENCOUNTER — Inpatient Hospital Stay (HOSPITAL_COMMUNITY): Payer: Self-pay | Admitting: Speech Pathology

## 2017-01-16 ENCOUNTER — Inpatient Hospital Stay (HOSPITAL_COMMUNITY): Payer: Self-pay | Admitting: *Deleted

## 2017-01-16 DIAGNOSIS — R600 Localized edema: Secondary | ICD-10-CM

## 2017-01-16 LAB — GLUCOSE, CAPILLARY
Glucose-Capillary: 86 mg/dL (ref 65–99)
Glucose-Capillary: 122 mg/dL — ABNORMAL HIGH (ref 65–99)
Glucose-Capillary: 174 mg/dL — ABNORMAL HIGH (ref 65–99)
Glucose-Capillary: 106 mg/dL — ABNORMAL HIGH (ref 65–99)

## 2017-01-16 MED ORDER — DEXAMETHASONE 4 MG PO TABS
4.0000 mg | ORAL_TABLET | Freq: Three times a day (TID) | ORAL | Status: DC
  Administered 2017-01-16 – 2017-01-18 (×6): 4 mg via ORAL
  Filled 2017-01-16 (×6): qty 1

## 2017-01-16 NOTE — Progress Notes (Signed)
Physical Therapy Session Note  Patient Details  Name: Aimee [name] Plan MRN: [id number] Date of Birth: 23-Sep-1975  Today's Date: 01/16/2017 PT Individual Time: 1000-1100; 1500-1535 PT Individual Time Calculation (min): 60 min; 35 min   Short Term Goals: Week 2:  PT Short Term Goal 1 (Week 2): Pt will perform sit>stand with UE support with mod A.  PT Short Term Goal 2 (Week 2): Pt will perform supine<>sit with min A.  PT Short Term Goal 3 (Week 2): Pt will propel manual w/c 144f with S.  PT Short Term Goal 4 (Week 2): Pt will initiate gait training.  PT Short Term Goal 5 (Week 2): Pt will perform stand pivot transfer with max A+2.   Skilled Therapeutic Interventions/Progress Updates:  Tx 1: Pt received supine in bed with RN providing medication. Pt expressed need to use the restroom and requested to use bed pan due to urgency. Rolling min A in bed; total A for peri hygiene and brief change; max A to pull up pants using bridging in supine. Supine>sit with mod A for weight shift. Max A sit>stand using stedy; total A transfer to tilt in space w/c. Pt total A to rehab gym in w/c and from w/c>mat using stedy. Sit>supine with mod A and verbal cues for management of LEs. Pt demonstrated improved ability to laterally scoot in supine to straighten herself out on the mat. Pt S with verbal cues for initiation and sequencing to roll L; min A to roll to prone on elbows. Lying prone on elbows for 2-3 mins for stretch with pt reporting this position feels good. Max A+2 to transition from prone on elbows to quadruped. Stability ball placed under pt's trunk for increased stability in this position. Pt performed weight shifting in quadruped forward/backward and R/L with verbal and tactile cues for technique. Pt had difficulty with attempt to isolate hip rotation in quadruped but improved with increased practice and multimodal cuing. Alternating forward/backward tapping with UEs with verbal cues to maintain anterior  weight shift. Max+2 to return to prone on elbows. Min A to roll from prone to supine; min A for supine>sit for weight shift. Max A sit>stand from mat; total A mat>w/c using stedy. Pt left semi-reclined in tilt in space w/c with all needs in reach.   Tx 2: Pt received supine in bed. Supine>sit with min A and verbal cues for sequencing and placement of LEs. Mod A for scooting to EOB; squat pivot transfer from bed>w/c with max A+2 with verbal cues for technique and sequencing. Pt propelled manual w/c 154fwith S and verbal cues to reach back with B LEs in order to decrease frequency of push during w/c propulsion. Pt sit>stand max A from manual w/c using armrest and rail on R in hallway. Weight shifts x5 R/L with max A in standing and single UE support at rail on hallway. Pt ambulated with max A and use of R rail in hallway [id number], [id number] with manual facilitation at hips to promote weight shift, manual facilitation at L knee to increase stability and prevent recurvatum, and verbal cues for sequencing, weight shift, and placement of R UE on rail; total A faded to min guard to prevent scissoring during gait for placement of L LE during ambulation. Pt total A in manual w/c back to pt's room; squat pivot slightly uphill transfer from bed>w/c max A +2. Mod A sit>supine and S for pt to straighten herself in bed with verbal cues for initiation and technique. Pt  left supine in bed with all needs met.   Therapy Documentation Precautions:  Precautions Precautions: Fall, Other (comment) Precaution Comments: quadriplegia (LUE/LLE more impaired) PRAFOs Restrictions Weight Bearing Restrictions: No RLE Weight Bearing: Weight bearing as tolerated LLE Weight Bearing: Weight bearing as tolerated   See Function Navigator for Current Functional Status.   Therapy/Group: Individual Therapy  Alysia Penna 01/16/2017, 4:14 PM

## 2017-01-16 NOTE — Progress Notes (Signed)
Occupational Therapy Weekly Progress Note  Patient Details  Name: Aimee [name] MRN: [id number] Date of Birth: 11/16/1975  Beginning of progress report period: January 08, 2017 End of progress report period: January 16, 2017  Patient has met 5 of 5 short term goals.  Pt made steady progress with BADLs since admission.  Pt demonstrates improved LUE functional movement during all tasks.  Pt completes bathing tasks at shower level with mod A.  Pt requires assistance with fastening her bra but is able to don her shirt at supervision level.  Pt requires tot A for LB dressing tasks at this time with sit<>stand with Stedy (max A). Pt requires use of Stedy for toilet and shower transfers. [name] brother has been present to observe therapies.  Patient continues to demonstrate the following deficits: muscle weakness, decreased cardiorespiratoy endurance, impaired timing and sequencing, abnormal tone, decreased coordination and decreased motor planning and decreased sitting balance, decreased standing balance, decreased postural control and decreased balance strategies and therefore will continue to benefit from skilled OT intervention to enhance overall performance with BADL and iADL.  Patient progressing toward long term goals..  Continue [name] of care.  OT Short Term Goals Week 1:  OT Short Term Goal 1 (Week 1): Pt will be able to roll in bed with min A for LB self care. OT Short Term Goal 1 - Progress (Week 1): Met OT Short Term Goal 2 (Week 1): Pt will be able to sit to EOB with min A to prepare for UB self care. OT Short Term Goal 2 - Progress (Week 1): Met OT Short Term Goal 3 (Week 1): Pt will maintain dynamic sitting with min A during UB self care. OT Short Term Goal 3 - Progress (Week 1): Met OT Short Term Goal 4 (Week 1): Pt will bathe UB with min A. OT Short Term Goal 4 - Progress (Week 1): Met OT Short Term Goal 5 (Week 1): Pt will don shirt with mod A. OT Short Term Goal 5 - Progress (Week 1):  Met Week 2:  OT Short Term Goal 1 (Week 2): Pt will perform sit<>stand with mod A from w/c in preparation for pulling up pants OT Short Term Goal 2 (Week 2): Pt will thread BLE into pants with min A OT Short Term Goal 3 (Week 2): Pt will don socks with min A OT Short Term Goal 4 (Week 2): Pt will perform toilet transfers with mod A      Therapy Documentation Precautions:  Precautions Precautions: Fall, Other (comment) Precaution Comments: quadriplegia (LUE/LLE more impaired) PRAFOs Restrictions Weight Bearing Restrictions: No RLE Weight Bearing: Weight bearing as tolerated LLE Weight Bearing: Weight bearing as tolerated    See Function Navigator for Current Functional Status.     [name] [facility] 01/16/2017, 8:04 AM

## 2017-01-16 NOTE — Progress Notes (Signed)
[place] PHYSICAL MEDICINE & REHABILITATION     PROGRESS NOTE    Subjective/Complaints: Overall doing well. Calf pain, left more than right after therapy yesterday  ROS: pt denies nausea, vomiting, diarrhea, cough, shortness of breath or chest pain   Objective: Vital Signs: Blood pressure 126/90, pulse 65, temperature 97.7 F (36.5 C), temperature source Oral, resp. rate 18, weight 84.6 kg (186 lb 8.2 oz), last menstrual period 12/31/2016, SpO2 100 %. No results found. No results for input(s): WBC, HGB, HCT, PLT in the last 72 hours. No results for input(s): NA, K, CL, GLUCOSE, BUN, CREATININE, CALCIUM in the last 72 hours.  Invalid input(s): CO CBG (last 3)   Recent Labs  01/15/17 1643 01/15/17 2134 01/16/17 0636  GLUCAP 104* 107* 86    Wt Readings from Last 3 Encounters:  01/16/17 84.6 kg (186 lb 8.2 oz)  01/07/17 88.9 kg (196 lb)    Physical Exam:  Constitutional: She appears well-developed. NAD. HENT: Normocephalic and atraumatic.  Eyes: EOMI. No discharge.  Cardiovascular: RRR without murmur. No JVD . Respiratory: CTA Bilaterally without wheezes or rales. Normal effort . GI: soft, BS+  Musculoskeletal: She exhibits no edema and mild calf tenderness Neurological: Alert and oriented  Motor: RUE: 4+/5 proximal to distal LUE: Shoulder abduction 4-/5, elbow flex/ext 4/5, hand grip 4/5 RLE: 3/5 right hip flexion, KE, 4/5 ADF/PF LLE:  2+ to 3/5 proximal to distal  Skin: Skin is warm and dry.  Psychiatric: She has a normal mood and affect. Her behavior is normal.   Assessment/Plan: 1. 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 Physiatrist is providing close team supervision and 24 hour management of active medical problems listed below. Physiatrist and rehab team continue to assess barriers to discharge/monitor  patient progress toward functional and medical goals.  Function:  Bathing Bathing position   Position: Wheelchair/chair at sink  Bathing parts Body parts bathed by patient: Right arm, Left arm, Chest, Abdomen, Front perineal area, Right upper leg, Left upper leg Body parts bathed by helper: Buttocks, Right lower leg, Left lower leg, Back  Bathing assist Assist Level: 2 helpers      Upper Body Dressing/Undressing Upper body dressing   What is the patient wearing?: Bra, Pull over shirt/dress Bra - Perfomed by patient: Thread/unthread right bra strap, Thread/unthread left bra strap Bra - Perfomed by helper: Hook/unhook bra (pull down sports bra) Pull over shirt/dress - Perfomed by patient: Thread/unthread right sleeve, Thread/unthread left sleeve, Put head through opening, Pull shirt over trunk Pull over shirt/dress - Perfomed by helper: Pull shirt over trunk        Upper body assist Assist Level: Touching or steadying assistance(Pt > 75%)      Lower Body Dressing/Undressing Lower body dressing   What is the patient wearing?: Underwear, Pants, Non-skid slipper socks   Underwear - Performed by helper: Thread/unthread right underwear leg, Thread/unthread left underwear leg, Pull underwear up/down   Pants- Performed by helper: Thread/unthread right pants leg, Thread/unthread left pants leg, Pull pants up/down   Non-skid slipper socks- Performed by helper: Don/doff right sock, Don/doff left sock                  Lower body assist Assist for lower body dressing: 2 Helpers      Toileting Toileting Toileting activity did not occur: N/A   Toileting steps completed by helper: Adjust clothing prior to  toileting, Performs perineal hygiene, Adjust clothing after toileting    Toileting assist Assist level: Two helpers   Transfers Chair/bed transfer   Chair/bed transfer method: Stand pivot Chair/bed transfer assist level: Total assist (Pt < 25%) Chair/bed transfer assistive  device: Mechanical lift Mechanical lift: Stedy   Locomotion Ambulation Ambulation activity did not occur: Safety/medical concerns (quadriplegia)         Wheelchair   Type:  (TBD) Max wheelchair distance: 150 Assist Level: Touching or steadying assistance (Pt > 75%)  Cognition Comprehension Comprehension assist level: Follows basic conversation/direction with no assist  Expression Expression assist level: Expresses basic 50 - 74% of the time/requires cueing 25 - 49% of the time. Needs to repeat parts of sentences.  Social Interaction Social Interaction assist level: Interacts appropriately with others with medication or extra time (anti-anxiety, antidepressant).  Problem Solving Problem solving assist level: Solves basic 90% of the time/requires cueing < 10% of the time  Memory Memory assist level: Recognizes or recalls 90% of the time/requires cueing < 10% of the time   Medical Problem List and Plan: 1.  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  DVT Prophylaxis/Anticoagulation: SCDs.  Left gastroc thrombus---ROM/activity  -recheck today given increased pain 3. Pain Management: Ultram as needed  -heat and stretching for calves. Likely related to exercise and clot in leg 4. Mood: Provide emotional support 5. Neuropsych: This patient is capable of making decisions on her own behalf. 6. Skin/Wound Care: Routine skin checks 7. Fluids/Electrolytes/Nutrition: Routine I&Os  -encourage PO 8. Seizure prophylaxis. Keppra 500 mg every 12 hours 9. Hypertension.Nimotop as per protocol21 days.    Controlled 6/10 10. Diabetes mellitus.SSI. Monitor closely while on Decadron. Patient on Glucophage 500 mg daily prior to admission.    Resumed glucophage  Overall controlled   11. Obesity. BMI 34.7 KG. 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 E.Coli UTI  Macrobid completed  LOS (Days) 9 A FACE TO FACE EVALUATION WAS PERFORMED  Meredith Staggers, MD 01/16/2017 8:39 AM

## 2017-01-16 NOTE — Progress Notes (Signed)
Modified Barium Swallow Progress Note  Patient Details  Name: Aimee [name] MRN: [id number] Date of Birth: 22-Jun-1976  Today's Date: 01/16/2017  Modified Barium Swallow completed.  Full report located under Chart Review in the Imaging Section.  Brief recommendations include the following:  Clinical Impression  Pt with great progress over previous FEES. Pt presents with mild delayed swallow initiation to vallecula with trials of thin liquids via cup and straw however, pt able to protect airway with all consistencies. The barium bill appeared mildly pasted to roof of mouth but cleared functionally with bolus of applesauce. Pt is appropriate for diet upgrade to regular with thin liquids, medicine whole with thin liquids or puree if needed. Pt demonstrated 1 mild throat clear that didn't appear related to any difficulties with swallow function. [name] voice remains hoarse and she was most likely benefit from ENT evaluation.    Swallow Evaluation Recommendations       SLP Diet Recommendations: Regular solids;Thin liquid   Liquid Administration via: Cup;Straw   Medication Administration: Whole meds with liquid   Supervision: Patient able to self feed;Intermittent supervision to cue for compensatory strategies   Compensations: Slow rate;Small sips/bites   Postural Changes: Seated upright at 90 degrees   Oral Care Recommendations: Oral care BID        Ki Corbo 01/16/2017,9:38 AM

## 2017-01-16 NOTE — Progress Notes (Signed)
Occupational Therapy Session Note  Patient Details  Name: Aimee [name] Plan MRN: [id number] Date of Birth: September 27, 1975  Today's Date: 01/16/2017 OT Individual Time: 9166-0600 OT Individual Time Calculation (min): 57 min    Short Term Goals: Week 1:  OT Short Term Goal 1 (Week 1): Pt will be able to roll in bed with min A for LB self care. OT Short Term Goal 2 (Week 1): Pt will be able to sit to EOB with min A to prepare for UB self care. OT Short Term Goal 3 (Week 1): Pt will maintain dynamic sitting with min A during UB self care. OT Short Term Goal 4 (Week 1): Pt will bathe UB with min A. OT Short Term Goal 5 (Week 1): Pt will don shirt with mod A.  Skilled Therapeutic Interventions/Progress Updates:    Pt engaged in BADL retraining including bathing/dressing with sit<>stand (Stedy) from w/c at sink.  Pt required min A for supine>sit EOB in preparation for transfer to w/c.  Pt required mod A for sit<>stand from EOB [facility]) with bed slightly elevated.  Pt required max A for sit<>stand (Stedy) from w/c.  Pt demonstrates ongoing improvement with LUE function and incorporates her LUE in all bathing/dressing tasks at diminished level.  Pt transitioned to self feeding with setup. Focus on activity tolerance, sit<>stand, bed mobility, standing balance, increased LUE use, and safety awareness to increase independence with BADLs.   Therapy Documentation Precautions:  Precautions Precautions: Fall, Other (comment) Precaution Comments: quadriplegia (LUE/LLE more impaired) PRAFOs Restrictions Weight Bearing Restrictions: No RLE Weight Bearing: Weight bearing as tolerated LLE Weight Bearing: Weight bearing as tolerated Pain:  Pt c/o BLE soreness; RN aware  See Function Navigator for Current Functional Status.   Therapy/Group: Individual Therapy  Leroy Libman 01/16/2017, 8:00 AM

## 2017-01-16 NOTE — Progress Notes (Signed)
Speech Language Pathology Daily Session Note  Patient Details  Name: Aimee [name] Plan MRN: [id number] Date of Birth: 10-02-1975  Today's Date: 01/16/2017 SLP Individual Time: 1300-1345 SLP Individual Time Calculation (min): 45 min  Short Term Goals: Week 2: SLP Short Term Goal 1 (Week 2): Patient will recall new, complex information related to her care with Mod I.  SLP Short Term Goal 2 (Week 2): Patient will solve complex problems with Mod I to recognize and correct errors. SLP Short Term Goal 3 (Week 2): Patient will perform EMST exercises at 45 cm H2O for 5 sets of 5 reps with a self-perceived effort level of 8/10 with Mod I accuracy with use of device. SLP Short Term Goal 4 (Week 2): Patient will produce sentence level verbal expression with Supervision level cues for increased vocal intensity.   SLP Short Term Goal 5 (Week 2): Patient will participate in an objective swallow assessment to determine effective strategies for least restrictive PO intake. SLP Short Term Goal 5 - Progress (Week 2): Met SLP Short Term Goal 6 (Week 2): Pt will consume regular diet with thin liquids without overt s/s of aspiration and Mod I for use of compensatory swallow strategies.   Skilled Therapeutic Interventions: Skilled treatment session focused on dysphagia and cognition goals. SLP facilitated session by providing skilled observation of pt consuming regular lunch tray with thin liquids. Pt consumed without overt s/s of aspiration and was Mod I for use of compensatory swallow strategies. Pt able to recall information from objective swallow assessment and solve semi-complex problems with supervision cues. Pt with great progress this session. Pt left upright in wheelchair and all needs within reach. Continue per current plan of care.      Function:  Eating Eating   Modified Consistency Diet: No Eating Assist Level: Swallowing techniques: self managed;No help, No cues            Cognition Comprehension Comprehension assist level: Follows basic conversation/direction with no assist  Expression   Expression assist level: Expresses basic needs/ideas: With extra time/assistive device  Social Interaction Social Interaction assist level: Interacts appropriately with others with medication or extra time (anti-anxiety, antidepressant).  Problem Solving Problem solving assist level: Solves basic problems with no assist  Memory Memory assist level: Recognizes or recalls 90% of the time/requires cueing < 10% of the time    Pain    Therapy/Group: Individual Therapy  Caytlin Better 01/16/2017, 1:41 PM

## 2017-01-16 NOTE — Progress Notes (Signed)
VASCULAR LAB PRELIMINARY  PRELIMINARY  PRELIMINARY  PRELIMINARY  Left lower extremity venous duplex completed.    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 No evidence of superficial thrombosis or Baker's cyst.  [place], RVS 01/16/2017, 4:29 PM

## 2017-01-17 ENCOUNTER — Inpatient Hospital Stay (HOSPITAL_COMMUNITY): Payer: Self-pay

## 2017-01-17 ENCOUNTER — Inpatient Hospital Stay (HOSPITAL_COMMUNITY): Payer: Self-pay | Admitting: *Deleted

## 2017-01-17 ENCOUNTER — Inpatient Hospital Stay (HOSPITAL_COMMUNITY): Payer: Self-pay | Admitting: Physical Therapy

## 2017-01-17 ENCOUNTER — Inpatient Hospital Stay (HOSPITAL_COMMUNITY): Payer: Self-pay | Admitting: Speech Pathology

## 2017-01-17 LAB — GLUCOSE, CAPILLARY
Glucose-Capillary: 80 mg/dL (ref 65–99)
Glucose-Capillary: 123 mg/dL — ABNORMAL HIGH (ref 65–99)
Glucose-Capillary: 113 mg/dL — ABNORMAL HIGH (ref 65–99)
Glucose-Capillary: 92 mg/dL (ref 65–99)

## 2017-01-17 NOTE — Progress Notes (Signed)
Physical Therapy Session Note  Patient Details  Name: Aimee [name] Plan MRN: [id number] Date of Birth: 02-08-1976  Today's Date: 01/17/2017 PT Individual Time: 1000-1100; 1445-1530 PT Individual Time Calculation (min): 60 min; 45 min   Short Term Goals: Week 2:  PT Short Term Goal 1 (Week 2): Pt will perform sit>stand with UE support with mod A.  PT Short Term Goal 2 (Week 2): Pt will perform supine<>sit with min A.  PT Short Term Goal 3 (Week 2): Pt will propel manual w/c 147f with S.  PT Short Term Goal 4 (Week 2): Pt will initiate gait training.  PT Short Term Goal 5 (Week 2): Pt will perform stand pivot transfer with max A+2.   Skilled Therapeutic Interventions/Progress Updates:  Tx 1: Pt received seated semi-reclined in tilt in space w/c. Total A to rehab gym; max A sit>stand from w/c using stedy; total A to mat using stedy. Sit>stand x6 from slightly elevated mat using stedy with verbal cues for forward lean and to look up to facilitate hip extension. Verbal cues for eccentric control during stand>sit; pt demonstrated improved ability to control descent for first 4 trials but required assistance for control increased as pt fatigued. Dynamic sitting balance with horseshoe toss feet supported>feet unsupported with S and focus on forward reaching. Sit>supine with min A on mat table for weight shift and guidance of LEs; min A rolling supine>prone on elbows for placement and weight shift. Prone on elbows>quadruped +2 A for manual facilitation at hips and UEs. Stability ball placed under pt's trunk for increased stability in this position in preparation to transition to tall kneeling. Bench placed in front of pt for UE support in tall kneeling with verbal and tactile cues for hip extension. Pt able to balance with single UE support alternating UE use to complete clothes pin task; occasional cues for hip extension; pt unable to maintain balance with no UE support. Max A+2 for tall kneeling>prone  on elbows. Min A for prone on elbow>sit; max A sit>stand using stedy; total A transfer to tilt in space w/c using stedy. Pt left semi-reclined in tilt in space w/c with all needs met.   Tx 2: Pt received seated semi-reclined in tilt in space w/c. Max A sit>stand using R rail in hallway; max A ambulation 25' x2 with manual facilitation at hips for weight shift and L knee for placement to prevent scissoring and excessive recurvatum. Verbal cues for R hand placement on rail and larger steps with R LE. Pt ambulated 143fmin A with RW with verbal cues for gait pattern. Mod A sit>stand from manual w/c using RW; pt ambulated 553using RW and w/c follow with min A for management/steering of RW and occasional manual facilitation to prevent knee recurvatum with verbal cues for gait pattern and larger step with R LE. L hand splint applied to RW during this ambulation trial to improve pt's ability to grip RW with L UE. Pt had difficulty during turning to sit on mat table in gym with multimodal cues and mod A for RW management; pt reported she felt stuck and had to be lowered to therapists' seated on mat table and then instructed to scoot hips backward until she was seated on mat. Stand pivot transfer from mat to tilt in space  w/c with +2 A for RW and w/c management and multimodal cues to assist pt with coordination of turn. Pt total A to day room with hand off to SLP.   Therapy Documentation Precautions:  Precautions Precautions: Fall, Other (comment) Precaution Comments: quadriplegia (LUE/LLE more impaired) PRAFOs Restrictions Weight Bearing Restrictions: No RLE Weight Bearing: Weight bearing as tolerated LLE Weight Bearing: Weight bearing as tolerated   See Function Navigator for Current Functional Status.   Therapy/Group: Individual Therapy  Alysia Penna 01/17/2017, 4:24 PM

## 2017-01-17 NOTE — Progress Notes (Signed)
Speech Language Pathology Daily Session Note  Patient Details  Name: Aimee [name] MRN: [id number] Date of Birth: 1976-01-02  Today's Date: 01/17/2017 SLP Individual Time: 9784-7841 SLP Individual Time Calculation (min): 45 min  Short Term Goals: Week 2: SLP Short Term Goal 1 (Week 2): Patient will recall new, complex information related to her care with Mod I.  SLP Short Term Goal 2 (Week 2): Patient will solve complex problems with Mod I to recognize and correct errors. SLP Short Term Goal 3 (Week 2): Patient will perform EMST exercises at 45 cm H2O for 5 sets of 5 reps with a self-perceived effort level of 8/10 with Mod I accuracy with use of device. SLP Short Term Goal 4 (Week 2): Patient will produce sentence level verbal expression with Supervision level cues for increased vocal intensity.   SLP Short Term Goal 5 (Week 2): Patient will participate in an objective swallow assessment to determine effective strategies for least restrictive PO intake. SLP Short Term Goal 5 - Progress (Week 2): Met SLP Short Term Goal 6 (Week 2): Pt will consume regular diet with thin liquids without overt s/s of aspiration and Mod I for use of compensatory swallow strategies.   Skilled Therapeutic Interventions: Skilled treatment session focused on dysphagia and cognition goals. SLP facilitated session by providing skilled observation of pt consuming thin liquids via straw. Pt was free of overt s/s of aspiration. Pt was Mod I for completion of novel complex card game. Pt able to recall information from previous sessions with Mod I. Pt was returned to room, left upright in wheelchair and all needs within reach. Continue per current [name] of care.      Function:  Eating Eating   Modified Consistency Diet: No             Cognition Comprehension Comprehension assist level: Follows basic conversation/direction with no assist  Expression   Expression assist level: Expresses basic needs/ideas: With  extra time/assistive device  Social Interaction Social Interaction assist level: Interacts appropriately with others with medication or extra time (anti-anxiety, antidepressant).  Problem Solving Problem solving assist level: Solves basic problems with no assist  Memory Memory assist level: Recognizes or recalls 90% of the time/requires cueing < 10% of the time    Pain    Therapy/Group: Individual Therapy  Donnalee Cellucci 01/17/2017, 4:01 PM

## 2017-01-17 NOTE — Progress Notes (Signed)
[place] PHYSICAL MEDICINE & REHABILITATION     PROGRESS NOTE    Subjective/Complaints: Feeling well. Just showered. Still some left calf pain  ROS: pt denies nausea, vomiting, diarrhea, cough, shortness of breath or chest pain   Objective: Vital Signs: Blood pressure 122/73, pulse 63, temperature 98.1 F (36.7 C), temperature source Oral, resp. rate 18, weight 84.6 kg (186 lb 8.2 oz), last menstrual period 12/31/2016, SpO2 99 %. Dg Swallowing Func-speech Pathology  Result Date: 01/16/2017 Objective Swallowing Evaluation: Type of Study: Bedside Swallow Evaluation Patient Details Name: Aimee [name] MRN: [id number] Date of Birth: May 11, 1976 Today's Date: 01/16/2017 Time: SLP Start Time (ACUTE ONLY): 1100-SLP Stop Time (ACUTE ONLY): 1120 SLP Time Calculation (min) (ACUTE ONLY): 20 min Past Medical History: Past Medical History: Diagnosis Date . Diabetes mellitus without complication ([place])  . Hypertension  Past Surgical History: Past Surgical History: Procedure Laterality Date . IR ANGIO INTRA EXTRACRAN SEL INTERNAL CAROTID BILAT MOD SED  12/25/2016 . IR ANGIO VERTEBRAL SEL VERTEBRAL UNI L MOD SED  12/25/2016 . IR ANGIOGRAM FOLLOW UP STUDY  12/25/2016 . IR ANGIOGRAM FOLLOW UP STUDY  12/25/2016 . IR ANGIOGRAM FOLLOW UP STUDY  12/25/2016 . IR ANGIOGRAM FOLLOW UP STUDY  12/25/2016 . IR TRANSCATH/EMBOLIZ  12/25/2016 . RADIOLOGY WITH ANESTHESIA N/A 12/25/2016  Procedure: RADIOLOGY WITH ANESTHESIA;  Surgeon: Consuella Lose, MD;  Location: [place];  Service: Radiology;  Laterality: N/A; HPI: 41 year old female presents with N/V and Confusion. 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 Underwent coiling, Intubated from 5/22 to 5/28.  No Data Recorded Assessment / [name] / Recommendation CHL IP CLINICAL IMPRESSIONS 01/16/2017 Clinical Impression Pt with great progress over previous FEES. Pt presents with mild delayed swallow initiation to vallecula with trials of thin liquids  via cup and straw however, pt able to protect airway with all consistencies. The barium bill appeared mildly pasted to roof of mouth but cleared functionally with bolus of applesauce. Pt is appropriate for diet upgrade to regular with thin liquids, medicine whole with thin liquids or puree if needed. Pt demonstrated 1 mild throat clear that didn't appear related to any difficulties with swallow function. [name] voice remains hoarse and she was most likely benefit from ENT evaluation.  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 Impact on safety and function Mild aspiration risk   CHL IP TREATMENT RECOMMENDATION 01/16/2017 Treatment Recommendations Therapy as outlined in treatment [name] below   Prognosis 01/02/2017 Prognosis for Safe Diet Advancement Good Barriers to Reach Goals -- Barriers/Prognosis Comment -- CHL IP DIET RECOMMENDATION 01/16/2017 SLP Diet Recommendations Regular solids;Thin liquid Liquid Administration via Cup;Straw Medication Administration Whole meds with liquid Compensations Slow rate;Small sips/bites Postural Changes Seated upright at 90 degrees   CHL IP OTHER RECOMMENDATIONS 01/16/2017 Recommended Consults -- Oral Care Recommendations Oral care BID Other Recommendations --   CHL IP FOLLOW UP RECOMMENDATIONS 01/04/2017 Follow up Recommendations Inpatient Rehab   CHL IP FREQUENCY AND DURATION 01/02/2017 Speech Therapy Frequency (ACUTE ONLY) min 2x/week Treatment Duration 2 weeks      CHL IP ORAL PHASE 01/16/2017 Oral Phase WFL Oral - Pudding Teaspoon -- Oral - Pudding Cup -- Oral - Honey Teaspoon -- Oral - Honey Cup -- Oral - Nectar Teaspoon -- Oral - Nectar Cup -- Oral - Nectar Straw -- Oral - Thin Teaspoon -- Oral - Thin Cup -- Oral - Thin Straw -- Oral -  Puree -- Oral - Mech Soft -- Oral - Regular -- Oral - Multi-Consistency -- Oral - Pill -- Oral Phase - Comment --   CHL IP PHARYNGEAL PHASE 01/16/2017 Pharyngeal Phase -- Pharyngeal- Pudding Teaspoon -- Pharyngeal -- Pharyngeal- Pudding Cup -- Pharyngeal -- Pharyngeal- Honey Teaspoon -- Pharyngeal -- Pharyngeal- Honey Cup -- Pharyngeal -- Pharyngeal- Nectar Teaspoon -- Pharyngeal -- Pharyngeal- Nectar Cup WFL Pharyngeal -- Pharyngeal- Nectar Straw NT Pharyngeal -- Pharyngeal- Thin Teaspoon Delayed swallow initiation-vallecula Pharyngeal -- Pharyngeal- Thin Cup Delayed swallow initiation-vallecula Pharyngeal -- Pharyngeal- Thin Straw Delayed swallow initiation-vallecula Pharyngeal -- Pharyngeal- Puree WFL Pharyngeal -- Pharyngeal- Mechanical Soft -- Pharyngeal -- Pharyngeal- Regular WFL Pharyngeal -- Pharyngeal- Multi-consistency -- Pharyngeal -- Pharyngeal- Pill WFL Pharyngeal -- Pharyngeal Comment --  CHL IP CERVICAL ESOPHAGEAL PHASE 01/16/2017 Cervical Esophageal Phase WFL Pudding Teaspoon -- Pudding Cup -- Honey Teaspoon -- Honey Cup -- Nectar Teaspoon -- Nectar Cup -- Nectar Straw -- Thin Teaspoon -- Thin Cup -- Thin Straw -- Puree -- Mechanical Soft -- Regular -- Multi-consistency -- Pill -- Cervical Esophageal Comment -- No flowsheet data found. Happi B. Rutherford Nail M.S., [place] 01/16/2017, 9:38 AM              No results for input(s): WBC, HGB, HCT, PLT in the last 72 hours. No results for input(s): NA, K, CL, GLUCOSE, BUN, CREATININE, CALCIUM in the last 72 hours.  Invalid input(s): CO CBG (last 3)   Recent Labs  01/16/17 1630 01/16/17 2124 01/17/17 0657  GLUCAP 174* 106* 80    Wt Readings from Last 3 Encounters:  01/16/17 84.6 kg (186 lb 8.2 oz)  01/07/17 88.9 kg (196 lb)    Physical Exam:  Constitutional: She appears well-developed. NAD. HENT: Normocephalic and atraumatic.  Eyes: EOMI. No discharge.  Cardiovascular: RRR . Respiratory: CTA Bilaterally without wheezes or rales. Normal effort . GI: soft, BS+  Musculoskeletal: She exhibits no edema and mild  calf tenderness Neurological: Alert and oriented  Motor: RUE: 4+/5 proximal to distal LUE: Shoulder abduction 4-/5, elbow flex/ext 4/5, hand grip 4/5 RLE: 3/5 right hip flexion, KE, 4/5 ADF/PF LLE:  2+ to 3/5 proximal to distal  Skin: Skin is warm and dry.  Psychiatric: She has a normal mood and affect. Her behavior is normal.   Assessment/[name]: 1. 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 Physiatrist is providing close team supervision and 24 hour management of active medical problems listed below. Physiatrist and rehab team continue to assess barriers to discharge/monitor patient progress toward functional and medical goals.  Function:  Bathing Bathing position   Position: Wheelchair/chair at sink  Bathing parts Body parts bathed by patient: Right arm, Left arm, Chest, Abdomen, Front perineal area, Right upper leg, Left upper leg Body parts bathed by helper: Buttocks, Right lower leg, Left lower leg, Back  Bathing assist Assist Level: 2 helpers      Upper Body Dressing/Undressing Upper body dressing   What is the patient wearing?: Bra, Pull over shirt/dress Bra - Perfomed by patient: Thread/unthread right bra strap, Thread/unthread left bra strap Bra - Perfomed by helper: Hook/unhook bra (pull down sports bra) Pull over shirt/dress - Perfomed by patient: Thread/unthread right sleeve, Thread/unthread left sleeve, Put head through opening, Pull shirt over trunk Pull over shirt/dress - Perfomed by helper: Pull shirt over trunk        Upper body assist Assist  Level: Touching or steadying assistance(Pt > 75%)      Lower Body Dressing/Undressing Lower body dressing   What is the patient wearing?: Underwear, Pants, Non-skid slipper socks   Underwear - Performed by helper: Thread/unthread right underwear leg, Thread/unthread left  underwear leg, Pull underwear up/down   Pants- Performed by helper: Thread/unthread right pants leg, Thread/unthread left pants leg, Pull pants up/down   Non-skid slipper socks- Performed by helper: Don/doff right sock, Don/doff left sock                  Lower body assist Assist for lower body dressing: 2 Helpers      Toileting Toileting Toileting activity did not occur: N/A   Toileting steps completed by helper: Adjust clothing prior to toileting, Performs perineal hygiene, Adjust clothing after toileting    Toileting assist Assist level: Two helpers   Transfers Chair/bed transfer   Chair/bed transfer method: Squat pivot Chair/bed transfer assist level: 2 helpers Chair/bed transfer assistive device: Armrests Mechanical lift: Ecologist Ambulation activity did not occur: Safety/medical concerns (quadriplegia)   Max distance: 51ft Assist level: Maximal assist (Pt 25 - 49%)   Wheelchair   Type:  (TBD) Max wheelchair distance: 131ft Assist Level: Supervision or verbal cues  Cognition Comprehension Comprehension assist level: Follows basic conversation/direction with no assist  Expression Expression assist level: Expresses basic needs/ideas: With extra time/assistive device  Social Interaction Social Interaction assist level: Interacts appropriately with others with medication or extra time (anti-anxiety, antidepressant).  Problem Solving Problem solving assist level: Solves basic problems with no assist  Memory Memory assist level: Recognizes or recalls 90% of the time/requires cueing < 10% of the time   Medical Problem List and [name]: 1.  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  DVT Prophylaxis/Anticoagulation: SCDs.  Left gastroc thrombus---ROM/activity  -f/u doppler unchanged 3. Pain  Management: Ultram as needed  -heat and stretching for calves. Likely related to exercise and clot in leg 4. Mood: Provide emotional support 5. Neuropsych: This patient is capable of making decisions on her own behalf. 6. Skin/Wound Care: Routine skin checks 7. Fluids/Electrolytes/Nutrition: Routine I&Os  -encourage PO 8. Seizure prophylaxis. Keppra 500 mg every 12 hours 9. Hypertension.Nimotop as per protocol21 days.    Controlled 6/10 10. Diabetes mellitus.SSI. Monitor closely while on Decadron. Patient on Glucophage 500 mg daily prior to admission.    Resumed glucophage  Overall control is reasonable  11. Obesity. BMI 34.7 KG. 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 E.Coli UTI  Macrobid completed  LOS (Days) 10 A FACE TO FACE EVALUATION WAS PERFORMED  Meredith Staggers, MD 01/17/2017 9:22 AM

## 2017-01-17 NOTE — Progress Notes (Signed)
Occupational Therapy Session Note  Patient Details  Name: Aimee [name] MRN: [id number] Date of Birth: 1976-05-15  Today's Date: 01/17/2017 OT Individual Time: 0700-0800 OT Individual Time Calculation (min): 60 min    Short Term Goals: Week 2:  OT Short Term Goal 1 (Week 2): Pt will perform sit<>stand with mod A from w/c in preparation for pulling up pants OT Short Term Goal 2 (Week 2): Pt will thread BLE into pants with min A OT Short Term Goal 3 (Week 2): Pt will don socks with min A OT Short Term Goal 4 (Week 2): Pt will perform toilet transfers with mod A  Skilled Therapeutic Interventions/Progress Updates:    Pt engaged in BADL retraining including bathing at shower level and dressing with sit<>stand from w/c [name]).  Pt requires max A for sit<>stand from w/c with Stedy.  Pt continues to demonstrate improved dynamic sitting balance during bathing tasks. Pt continues to demonstrate improved use of LUE during bathing and dressing tasks.  Focus on LUE functional use, standing balance, sit<>stand, sitting balance, and activity tolerance to increase independence with BADLs.   Therapy Documentation Precautions:  Precautions Precautions: Fall, Other (comment) Precaution Comments: quadriplegia (LUE/LLE more impaired) PRAFOs Restrictions Weight Bearing Restrictions: No RLE Weight Bearing: Weight bearing as tolerated LLE Weight Bearing: Weight bearing as tolerated General:   Vital Signs: Therapy Vitals Temp: 98.1 F (36.7 C) Temp Source: Oral Pulse Rate: 63 Resp: 18 BP: 122/73 Patient Position (if appropriate): Lying Oxygen Therapy SpO2: 99 % O2 Device: Not Delivered Pain:   ADL: ADL ADL Comments: refer functional navigator Vision   Perception    Praxis   Exercises:   Other Treatments:    See Function Navigator for Current Functional Status.   Therapy/Group: Individual Therapy  Leroy Libman 01/17/2017, 8:01 AM

## 2017-01-17 NOTE — Progress Notes (Signed)
Occupational Therapy Note  Patient Details  Name: Aimee [name] MRN: [id number] Date of Birth: 03-05-1976  Today's Date: 01/17/2017 OT Individual Time: 1300-1400 OT Individual Time Calculation (min): 60 min   Pt denied pain Individual Therapy  Pt engaged in LUE functional tasks while seated EOM.  Pt also practiced donning/doffing B socks.  Pt completed task with min A and placed on raised step to facilitate increase access to feet.  Pt unable to cross legs at this time.  Pt also engaged removing and replacing bottle caps on medicine bottles.  Pt issued med soft (red) theraputty and demonstrated finger strengthening activities.  Pt return demonstrated exercises.  Pt demonstrates improved FMC/manipulation abilities with LUE but continues to demonstrate significant weakness and unable to grasp and open blue clothespins. Pt returned to room and remained in w/c with all needs within reach.    [name] [facility] 01/17/2017, 2:56 PM

## 2017-01-18 ENCOUNTER — Inpatient Hospital Stay (HOSPITAL_COMMUNITY): Payer: Self-pay | Admitting: Physical Therapy

## 2017-01-18 ENCOUNTER — Inpatient Hospital Stay (HOSPITAL_COMMUNITY): Payer: Self-pay | Admitting: *Deleted

## 2017-01-18 ENCOUNTER — Inpatient Hospital Stay (HOSPITAL_COMMUNITY): Payer: Self-pay | Admitting: Speech Pathology

## 2017-01-18 ENCOUNTER — Inpatient Hospital Stay (HOSPITAL_COMMUNITY): Payer: Self-pay

## 2017-01-18 LAB — GLUCOSE, CAPILLARY
Glucose-Capillary: 85 mg/dL (ref 65–99)
Glucose-Capillary: 99 mg/dL (ref 65–99)
Glucose-Capillary: 178 mg/dL — ABNORMAL HIGH (ref 65–99)
Glucose-Capillary: 115 mg/dL — ABNORMAL HIGH (ref 65–99)

## 2017-01-18 MED ORDER — DEXAMETHASONE 2 MG PO TABS
2.0000 mg | ORAL_TABLET | Freq: Three times a day (TID) | ORAL | Status: DC
  Administered 2017-01-18 – 2017-01-21 (×9): 2 mg via ORAL
  Filled 2017-01-18 (×9): qty 1

## 2017-01-18 NOTE — Progress Notes (Signed)
Physical Therapy Session Note  Patient Details  Name: Aimee [name] MRN: [id number] Date of Birth: Apr 10, 1976  Today's Date: 01/18/2017 PT Individual Time: 0900-0930 PT Individual Time Calculation (min): 30 min   Short Term Goals: Week 2:  PT Short Term Goal 1 (Week 2): Pt will perform sit>stand with UE support with mod A.  PT Short Term Goal 2 (Week 2): Pt will perform supine<>sit with min A.  PT Short Term Goal 3 (Week 2): Pt will propel manual w/c 159ft with S.  PT Short Term Goal 4 (Week 2): Pt will initiate gait training.  PT Short Term Goal 5 (Week 2): Pt will perform stand pivot transfer with max A+2.   Skilled Therapeutic Interventions/Progress Updates:  Tx 1: Pt received semi-reclined in tilt in space w/c. Pt states her legs are mildly sore from therapy yesterday. Total A to hallway outside rehab gym. Pt sit>stand from tilt in space w/c varied from min>mod A throughout session with cues for anterior lean and placement of UEs on w/c and RW. Pt ambulated with RW and min A x 3 trials; 1x 67ft, 1x 36ft, 1x 79ft with 2 turns. Pt requires verbal cues for RW management, gait pattern with RW, increased step length on R LE and min A with RW management and occasional manual facilitation at L LE to prevent scissoring gait. Pt demonstrates increased step length on R LE when cued to move RW forward after every step and to step R LE in front of L LE. Pt has increased difficulty coordinating turns but shows improvement compared to yesterday; pt requires increased time to complete task and verbal cues and assist for RW management/placement. Pt returned to her room total A in tilt in space w/c where she remained seated with all needs in reach.   Therapy Documentation Precautions:  Precautions Precautions: Fall, Other (comment) Precaution Comments: quadriplegia (LUE/LLE more impaired) PRAFOs Restrictions Weight Bearing Restrictions: No RLE Weight Bearing: Weight bearing as tolerated LLE  Weight Bearing: Weight bearing as tolerated   See Function Navigator for Current Functional Status.   Therapy/Group: Individual Therapy  Alysia Penna 01/18/2017, 12:42 PM

## 2017-01-18 NOTE — Progress Notes (Signed)
[place] PHYSICAL MEDICINE & REHABILITATION     PROGRESS NOTE    Subjective/Complaints: Feeling well. Just showered. Still some left calf pain  ROS: pt denies nausea, vomiting, diarrhea, cough, shortness of breath or chest pain   Objective: Vital Signs: Blood pressure 115/72, pulse 61, temperature 98.1 F (36.7 C), temperature source Oral, resp. rate 18, weight 84.6 kg (186 lb 8.2 oz), last menstrual period 12/31/2016, SpO2 99 %. No results found. No results for input(s): WBC, HGB, HCT, PLT in the last 72 hours. No results for input(s): NA, K, CL, GLUCOSE, BUN, CREATININE, CALCIUM in the last 72 hours.  Invalid input(s): CO CBG (last 3)   Recent Labs  01/17/17 1618 01/17/17 2146 01/18/17 0620  GLUCAP 113* 92 85    Wt Readings from Last 3 Encounters:  01/16/17 84.6 kg (186 lb 8.2 oz)  01/07/17 88.9 kg (196 lb)    Physical Exam:  Constitutional: She appears well-developed. NAD. HENT: Normocephalic and atraumatic.  Eyes: EOMI. No discharge.  Cardiovascular: RRR Respiratory: CTA Bilaterally without wheezes or rales. Normal effort  GI: soft, BS+  Musculoskeletal: She exhibits no edema and mild calf tenderness persists on left Neurological: Alert and oriented  Motor: RUE: 4+/5 proximal to distal LUE: Shoulder abduction 4-/5, elbow flex/ext 4/5, hand grip 4/5 RLE: 3/5 right hip flexion, KE, 4/5 ADF/PF LLE:  2+ to 3/5 proximal to distal  Skin: Skin is warm and dry.  Psychiatric: She has a normal mood and affect. Her behavior is normal.   Assessment/Plan: 1. 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 Physiatrist is providing close team supervision and 24 hour management of active medical problems listed below. Physiatrist and rehab team continue to assess barriers to discharge/monitor patient progress toward functional  and medical goals.  Function:  Bathing Bathing position   Position: Wheelchair/chair at sink  Bathing parts Body parts bathed by patient: Right arm, Left arm, Chest, Abdomen, Front perineal area, Right upper leg, Left upper leg Body parts bathed by helper: Buttocks, Right lower leg, Left lower leg, Back  Bathing assist Assist Level: 2 helpers      Upper Body Dressing/Undressing Upper body dressing   What is the patient wearing?: Bra, Pull over shirt/dress Bra - Perfomed by patient: Thread/unthread right bra strap, Thread/unthread left bra strap Bra - Perfomed by helper: Hook/unhook bra (pull down sports bra) Pull over shirt/dress - Perfomed by patient: Thread/unthread right sleeve, Thread/unthread left sleeve, Put head through opening, Pull shirt over trunk Pull over shirt/dress - Perfomed by helper: Pull shirt over trunk        Upper body assist Assist Level: Touching or steadying assistance(Pt > 75%)      Lower Body Dressing/Undressing Lower body dressing   What is the patient wearing?: Underwear, Pants, Non-skid slipper socks   Underwear - Performed by helper: Thread/unthread right underwear leg, Thread/unthread left underwear leg, Pull underwear up/down Pants- Performed by patient: Thread/unthread right pants leg Pants- Performed by helper: Thread/unthread left pants leg, Pull pants up/down   Non-skid slipper socks- Performed by helper: Don/doff right sock, Don/doff left sock                  Lower body assist Assist for lower body dressing: 2 Helpers      Toileting Toileting Toileting activity did not occur: N/A   Toileting steps completed by helper: Adjust clothing prior to toileting, Performs perineal  hygiene, Adjust clothing after toileting Toileting Assistive Devices: Other (comment) (stedy)  Toileting assist Assist level: Two helpers   Transfers Chair/bed transfer   Chair/bed transfer method: Stand pivot Chair/bed transfer assist level: 2  helpers Chair/bed transfer assistive device: Armrests, Environmental manager lift: Architectural technologist activity did not occur: Safety/medical concerns (quadriplegia)   Max distance: 71ft Assist level: Touching or steadying assistance (Pt > 75%)   Wheelchair   Type:  (TBD) Max wheelchair distance: 110ft Assist Level: Supervision or verbal cues  Cognition Comprehension Comprehension assist level: Follows complex conversation/direction with extra time/assistive device  Expression Expression assist level: Expresses basic needs/ideas: With no assist  Social Interaction Social Interaction assist level: Interacts appropriately with others with medication or extra time (anti-anxiety, antidepressant).  Problem Solving Problem solving assist level: Solves complex 90% of the time/cues < 10% of the time  Memory Memory assist level: Complete Independence: No helper   Medical Problem List and Plan: 1.  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 Remains motivated 2.  DVT Prophylaxis/Anticoagulation: SCDs.  Left gastroc thrombus---ROM/activity/heat and massage  -f/u doppler unchanged this week 3. Pain Management: Ultram as needed  -heat and stretching for calves. Likely related to exercise and clot in leg 4. Mood: Provide emotional support 5. Neuropsych: This patient is capable of making decisions on her own behalf. 6. Skin/Wound Care: Routine skin checks 7. Fluids/Electrolytes/Nutrition: Routine I&Os  -encourage PO 8. Seizure prophylaxis. Keppra 500 mg every 12 hours 9. Hypertension.Nimotop as per protocol21 days.      10. Diabetes mellitus.SSI. Monitor closely while on Decadron. Patient on Glucophage 500 mg daily prior to admission.    Resumed glucophage  Overall control is reasonable  11. Obesity. BMI 34.7 KG.  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 E.Coli UTI  Macrobid completed  LOS (Days) 11 A FACE TO FACE EVALUATION WAS PERFORMED  Alger Simons T, MD 01/18/2017 10:09 AM

## 2017-01-18 NOTE — Progress Notes (Signed)
Occupational Therapy Session Note  Patient Details  Name: Aimee [name] MRN: [id number] Date of Birth: May 25, 1976  Today's Date: 01/18/2017 OT Individual Time: 1400-1430 OT Individual Time Calculation (min): 30 min    Short Term Goals: Week 2:  OT Short Term Goal 1 (Week 2): Pt will perform sit<>stand with mod A from w/c in preparation for pulling up pants OT Short Term Goal 2 (Week 2): Pt will thread BLE into pants with min A OT Short Term Goal 3 (Week 2): Pt will don socks with min A OT Short Term Goal 4 (Week 2): Pt will perform toilet transfers with mod A  Skilled Therapeutic Interventions/Progress Updates:    1;1. PT student present throughout session. Pt on toilet upon arrival with stedy around for balance. Pt sit to stand with MOD A in stedy and VC for L knee extension. Pt req A for advancing pants past hips. Pt washes hands in standing in stedy with MIN A and VC to incorporate LUE. Pt transfers into TIS with stedy with MIN A and VC for safety awareness. Pt stand pivot transfer with RW TIS<>EOM with MIN A and guard of L knee for buckling. Pt stands with RW to cross midline to obtain/place clothespins using tip to tip pinch and key pinch to strengthen LUE. Exited session with pt seated in w/c with call light in reach and all needs met.   Therapy Documentation Precautions:  Precautions Precautions: Fall, Other (comment) Precaution Comments: quadriplegia (LUE/LLE more impaired) PRAFOs Restrictions Weight Bearing Restrictions: No RLE Weight Bearing: Weight bearing as tolerated LLE Weight Bearing: Weight bearing as tolerated  See Function Navigator for Current Functional Status.   Therapy/Group: Individual Therapy  Tonny Branch 01/18/2017, 3:25 PM

## 2017-01-18 NOTE — Progress Notes (Signed)
Occupational Therapy Note  Patient Details  Name: Aimee [name] MRN: [id number] Date of Birth: 1975/09/19  Today's Date: 01/18/2017 OT Individual Time: 8677-3736 OT Individual Time Calculation (min): 43 min   Pt denied pain Individual Therapy  Pt engaged in LUE therapeutic activities while seated in w/c with arm rests removed.  Initial focus on reaching for objects to L requiring shoulder flexion and adduction >90 degrees with emphasis on improved form.  Pt also engaged in L hand/pincher grasp tasks with small clips.  Pt continues to exhibit significant grasp/pincher weakness.  Pt returned to room and remained in w/c with all needs within reach.    [name] [facility] 01/18/2017, 11:59 AM

## 2017-01-18 NOTE — Progress Notes (Signed)
Occupational Therapy Session Note  Patient Details  Name: Aimee [name] MRN: [id number] Date of Birth: August 10, 1975  Today's Date: 01/18/2017 OT Individual Time: 3612-2449 OT Individual Time Calculation (min): 55 min    Short Term Goals: Week 2:  OT Short Term Goal 1 (Week 2): Pt will perform sit<>stand with mod A from w/c in preparation for pulling up pants OT Short Term Goal 2 (Week 2): Pt will thread BLE into pants with min A OT Short Term Goal 3 (Week 2): Pt will don socks with min A OT Short Term Goal 4 (Week 2): Pt will perform toilet transfers with mod A  Skilled Therapeutic Interventions/Progress Updates:    Pt engaged in BADL retraining including bathing/dressing with sit<>stand from w/c (Stedy) at sink.  Pt required min A for bed mobility and max A for sit<>stand from EOB for transfers with Stedy to w/c.  Pt required min A for sit<>stand from w/c with Stedy.  Pt continues to demonstrate improved LUE function with all bathing/dressing tasks.  Focus on activity tolerance, sit<>stand, standing balance, increased LUE use with functional tasks, and safety awareness to increase independence with BADLs.   Therapy Documentation Precautions:  Precautions Precautions: Fall, Other (comment) Precaution Comments: quadriplegia (LUE/LLE more impaired) PRAFOs Restrictions Weight Bearing Restrictions: No RLE Weight Bearing: Weight bearing as tolerated LLE Weight Bearing: Weight bearing as tolerated Pain:  Pt denied pain  See Function Navigator for Current Functional Status.   Therapy/Group: Individual Therapy  Leroy Libman 01/18/2017, 7:59 AM

## 2017-01-18 NOTE — Progress Notes (Signed)
Speech Language Pathology Discharge Summary  Patient Details  Name: Aimee [name] Plan MRN: [id number] Date of Birth: November 04, 1975  Today's Date: 01/18/2017 SLP Individual Time: 0830-0900 SLP Individual Time Calculation (min): 30 min   Skilled Therapeutic Interventions:  Skilled treatment session focused on dysphagia and cognition goals. SLP facilitated session by providing skilled observation of pt consuming regular snack and thin liquids via cup. Pt consumed without overt s/s of aspiration. Pt able to independently recall rules of previously learned card game. MOCA version 8.1 administered and pt received score of 26 out of 30 with n=>26. Pt has met all of her dysphagia and speech-language goals. As a result, she is appropriate for discharge from [facility] services. No follow up services indicated.     Patient has met 4 of 4 long term goals.  Patient to discharge at overall Modified Independent level.  Reasons goals not met:     Clinical Impression/Discharge Summary:   Pt has made great progress in ST. As a result she is current Mod I to Independent with swallowing and speech-language abilities. Education provided to pt's brother on 01/16/17 and no further needs are identified at this time.   Care Partner:  Caregiver Able to Provide Assistance: Yes  Type of Caregiver Assistance: Physical  Recommendation:  None      Equipment: N/A   Reasons for discharge: Treatment goals met   Patient/Family Agrees with Progress Made and Goals Achieved: Yes   Function:  Eating Eating   Modified Consistency Diet: No Eating Assist Level: Set up assist for   Eating Set Up Assist For: Opening containers       Cognition Comprehension Comprehension assist level: Follows complex conversation/direction with extra time/assistive device  Expression   Expression assist level: Expresses basic needs/ideas: With no assist  Social Interaction Social Interaction assist level: Interacts appropriately with  others with medication or extra time (anti-anxiety, antidepressant).  Problem Solving Problem solving assist level: Solves complex 90% of the time/cues < 10% of the time  Memory Memory assist level: Complete Independence: No helper   Isbella Arline B. Rutherford Nail, M.S., CCC-SLP Speech-Language Pathologist  Mercedes Valeriano 01/18/2017, 9:49 AM

## 2017-01-19 ENCOUNTER — Inpatient Hospital Stay (HOSPITAL_COMMUNITY): Payer: Self-pay | Admitting: Physical Therapy

## 2017-01-19 LAB — GLUCOSE, CAPILLARY
Glucose-Capillary: 81 mg/dL (ref 65–99)
Glucose-Capillary: 82 mg/dL (ref 65–99)
Glucose-Capillary: 99 mg/dL (ref 65–99)
Glucose-Capillary: 125 mg/dL — ABNORMAL HIGH (ref 65–99)

## 2017-01-19 NOTE — Progress Notes (Signed)
  [name] PHYSICAL MEDICINE & REHABILITATION     PROGRESS NOTE    Subjective/Complaints: Feels well, no complaints.    Objective: Vital Signs: Blood pressure 110/73, pulse (!) 58, temperature 98.2 F (36.8 C), temperature source Oral, resp. rate 18, weight 186 lb 8.2 oz (84.6 kg), last menstrual period 12/31/2016, SpO2 100 %.   Physical Exam:   Well-developed well-nourished female in no acute distress. HEENT exam atraumatic, normocephalic, extraocular muscles are intact. Neck is supple. No jugular venous distention no thyromegaly. Chest clear to auscultation without increased work of breathing. Cardiac exam S1 and S2 are regular. Abdominal exam active bowel sounds, soft, nontender. Extremities no edema. Neurologic exam she is alert without any motor sensory deficits.   Assessment/Plan: 1. 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  DVT Prophylaxis/Anticoagulation: SCDs.  Left gastroc thrombus---ROM/activity/heat and massage  -f/u doppler unchanged this week 3. Pain Management: denies pain 4. Mood: Provide emotional support 5. Neuropsych: This patient is capable of making decisions on her own behalf. 6. Skin/Wound Care: Routine skin checks 7. Fluids/Electrolytes/Nutrition: Routine I&Os Basic Metabolic Panel:    Component Value Date/Time   NA 135 01/08/2017 0546   K 4.5 01/08/2017 0546   CL 101 01/08/2017 0546   CO2 22 01/08/2017 0546   BUN 22 (H) 01/08/2017 0546   CREATININE 0.60 01/08/2017 0546   GLUCOSE 126 (H) 01/08/2017 0546   CALCIUM 8.7 (L) 01/08/2017 0546    8. Seizure prophylaxis. Keppra 500 mg every 12 hours 9. Hypertension.Nimotop as per protocol21 days.   10. Diabetes mellitus.SSI.  Monitor closely while on Decadron. Patient on Glucophage 500 mg daily prior to admission.    Resumed glucophage CBG (last 3)   Recent Labs  01/18/17 1622 01/18/17 2050 01/19/17 0647  GLUCAP 178* 115* 81   11. Obesity. BMI 34.7 KG. 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 E.Coli UTI  Resolved after macrobid  LOS (Days) 12 A FACE TO FACE EVALUATION WAS PERFORMED  Lisabeth Pick, MD 01/19/2017 9:10 AM

## 2017-01-19 NOTE — Progress Notes (Signed)
Physical Therapy Session Note  Patient Details  Name: Aimee [name] Plan MRN: [id number] Date of Birth: July 27, 1976  Today's Date: 01/19/2017 PT Individual Time: 0830-0900 PT Individual Time Calculation (min): 30 min   Short Term Goals: Week 2:  PT Short Term Goal 1 (Week 2): Pt will perform sit>stand with UE support with mod A.  PT Short Term Goal 2 (Week 2): Pt will perform supine<>sit with min A.  PT Short Term Goal 3 (Week 2): Pt will propel manual w/c 170ft with S.  PT Short Term Goal 4 (Week 2): Pt will initiate gait training.  PT Short Term Goal 5 (Week 2): Pt will perform stand pivot transfer with max A+2.   Skilled Therapeutic Interventions/Progress Updates: Pt received seated in bed, denies pain and agreeable to treatment. Supine>sit with S using bedrails and HOB elevated. Stand pivot transfer minA to [facility] with therapist providing tactile cueing and facilitation at L knee for stance control, min verbal cues for sequencing during turn. TotalA hygiene and clothing management from [facility]. Stand pivot transfer to w/c with minA and RW. Upper body dressing setupA. Lower body dressing minA to pull underwear and pants over hips after pt completed all but small portion of L hip; leg rests utilized to improve reach to feet in order to thread underwear, shorts and socks. Able to thread both feet into socks, however unable to pull over heel and requires assist. Min guard standing balance while pt uses BUEs to pull up pants/underwear. Remained seated in w/c at end of session, all needs in reach.      Therapy Documentation Precautions:  Precautions Precautions: Fall, Other (comment) Precaution Comments: quadriplegia (LUE/LLE more impaired) PRAFOs Restrictions Weight Bearing Restrictions: No RLE Weight Bearing: Weight bearing as tolerated LLE Weight Bearing: Weight bearing as tolerated   See Function Navigator for Current Functional Status.   Therapy/Group: Individual Therapy  Luberta Mutter 01/19/2017, 9:31 AM

## 2017-01-20 ENCOUNTER — Inpatient Hospital Stay (HOSPITAL_COMMUNITY): Payer: Self-pay

## 2017-01-20 LAB — GLUCOSE, CAPILLARY
Glucose-Capillary: 90 mg/dL (ref 65–99)
Glucose-Capillary: 85 mg/dL (ref 65–99)
Glucose-Capillary: 157 mg/dL — ABNORMAL HIGH (ref 65–99)
Glucose-Capillary: 95 mg/dL (ref 65–99)

## 2017-01-20 NOTE — Progress Notes (Signed)
Occupational Therapy Session Note  Patient Details  Name: Aimee [name] MRN: [id number] Date of Birth: September 18, 1975  Today's Date: 01/20/2017 OT Individual Time: 1300-1345 OT Individual Time Calculation (min): 45 min    Short Term Goals: Week 2:  OT Short Term Goal 1 (Week 2): Pt will perform sit<>stand with mod A from w/c in preparation for pulling up pants OT Short Term Goal 2 (Week 2): Pt will thread BLE into pants with min A OT Short Term Goal 3 (Week 2): Pt will don socks with min A OT Short Term Goal 4 (Week 2): Pt will perform toilet transfers with mod A  Skilled Therapeutic Interventions/Progress Updates:    1:1. Pt agreeable to shower level bathe and dress. Pt sit to stand in steady with MOD A +2 hands on for balance to transfer w/c<> TTB. Pt bathes seated on TTB with A to wash back, B lower legs and buttocks. Ot cues pt to use LUE in hair washing and squeezing shampoo/conditioner bottles to strengthen LUE. Pt dresses at sit to stand level with MOD A in stedy. Pt require A to bring RLE to seated figure 4 to thread pant leg and stands in stedy as stated above for OT to advance pants past hips. Pt able to hook bra in front this date, however requires A to turn bra around. Pt dons shirt with supervision. Pt elevates legs to don L sock and tie B shoes, however pt requires A to bring R sock over heel and push heels into B shoes. At sink pt completes oral and hair care with supervision. Exited session with pt seated in w/c with call light in reach and all needs met.   Therapy Documentation Precautions:  Precautions Precautions: Fall, Other (comment) Precaution Comments: quadriplegia (LUE/LLE more impaired) PRAFOs Restrictions Weight Bearing Restrictions: Yes RLE Weight Bearing: Weight bearing as tolerated LLE Weight Bearing: Weight bearing as tolerated General:  See Function Navigator for Current Functional Status.   Therapy/Group: Individual Therapy  Tonny Branch 01/20/2017, 3:30 PM

## 2017-01-20 NOTE — Progress Notes (Signed)
  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 rate 18, weight 186 lb 8.2 oz (84.6 kg), last menstrual period 12/31/2016, SpO2 99 %.   Physical Exam:    Well-developed well-nourished female in no acute distress. HEENT exam atraumatic, normocephalic, extraocular muscles are intact. Neck is supple. No jugular venous distention no thyromegaly. Chest clear to auscultation without increased work of breathing. Cardiac exam S1 and S2 are regular. Abdominal exam active bowel sounds, soft, nontender. Extremities no edema. Neurologic exam she is alert     Assessment/Plan: 1. 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  DVT Prophylaxis/Anticoagulation: SCDs.  Left gastroc thrombus---ROM/activity/heat and massage  -f/u doppler unchanged this week 3. Pain Management: denies pain 4. Mood: Provide emotional support 5. Neuropsych: This patient is capable of making decisions on her own behalf. 6. Skin/Wound Care: Routine skin checks 7. Fluids/Electrolytes/Nutrition: Routine I&Os Basic Metabolic Panel:    Component Value Date/Time   NA 135 01/08/2017 0546   K 4.5 01/08/2017 0546   CL 101 01/08/2017 0546   CO2 22 01/08/2017 0546   BUN 22 (H) 01/08/2017 0546   CREATININE 0.60 01/08/2017 0546   GLUCOSE 126 (H) 01/08/2017 0546   CALCIUM 8.7 (L) 01/08/2017 0546    8. Seizure prophylaxis. Keppra 500 mg every 12 hours 9. Hypertension.Nimotop as per protocol21 days.   10. Diabetes mellitus.SSI.  Monitor closely while on Decadron. Patient on Glucophage 500 mg daily prior to admission.    Resumed glucophage CBG (last 3)   Recent Labs  01/19/17 1717 01/19/17 2040 01/20/17 0644  GLUCAP 99 125* 90   11. Obesity. 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 No s/s of infection Lab Results  Component Value Date   WBC 22.5 (H) 01/08/2017   13. E.Coli UTI  Resolved after macrobid  LOS (Days) 13 A FACE TO FACE EVALUATION WAS PERFORMED  Lisabeth Pick, MD 01/20/2017 9:02 AM

## 2017-01-20 NOTE — Plan of Care (Signed)
Problem: RH SKIN INTEGRITY Goal: RH STG SKIN FREE OF INFECTION/BREAKDOWN With min assist  Outcome: Progressing Still on contact precautions for ESBL

## 2017-01-20 NOTE — Progress Notes (Signed)
Client showed no signs of any distress during my shift. Even unlabored respirations.

## 2017-01-21 ENCOUNTER — Inpatient Hospital Stay (HOSPITAL_COMMUNITY): Payer: Self-pay

## 2017-01-21 ENCOUNTER — Inpatient Hospital Stay (HOSPITAL_COMMUNITY): Payer: Self-pay | Admitting: Physical Therapy

## 2017-01-21 ENCOUNTER — Inpatient Hospital Stay (HOSPITAL_COMMUNITY): Payer: Self-pay | Admitting: *Deleted

## 2017-01-21 DIAGNOSIS — I608 Other nontraumatic subarachnoid hemorrhage: Secondary | ICD-10-CM

## 2017-01-21 LAB — GLUCOSE, CAPILLARY
Glucose-Capillary: 83 mg/dL (ref 65–99)
Glucose-Capillary: 84 mg/dL (ref 65–99)
Glucose-Capillary: 98 mg/dL (ref 65–99)
Glucose-Capillary: 153 mg/dL — ABNORMAL HIGH (ref 65–99)

## 2017-01-21 MED ORDER — DEXAMETHASONE 2 MG PO TABS
2.0000 mg | ORAL_TABLET | Freq: Two times a day (BID) | ORAL | Status: DC
  Administered 2017-01-21 – 2017-01-23 (×4): 2 mg via ORAL
  Filled 2017-01-21 (×4): qty 1

## 2017-01-21 NOTE — Progress Notes (Signed)
Occupational Therapy Session Note  Patient Details  Name: Aimee [name] Plan MRN: [id number] Date of Birth: [birth year]  Today's Date: 01/21/2017 OT Individual Time: 9480-1655 OT Individual Time Calculation (min): 57 min    Short Term Goals: Week 2:  OT Short Term Goal 1 (Week 2): Pt will perform sit<>stand with mod A from w/c in preparation for pulling up pants OT Short Term Goal 2 (Week 2): Pt will thread BLE into pants with min A OT Short Term Goal 3 (Week 2): Pt will don socks with min A OT Short Term Goal 4 (Week 2): Pt will perform toilet transfers with mod A  Skilled Therapeutic Interventions/Progress Updates:    Pt resting in bed upon arrival.  Pt performed supine>sit EOB (HOB elevated slightly) at supervision level with use of rails.  Pt required max A for sit>stand from EOB with Stedy.  Pt engaged in BADL retraining including bathing/dressing with sit<>stand from w/c [name]) to increase independence with BADLs. Pt was able to bathe buttocks and periarea while standing in [place].  Pt is able to shift weight while standing in [place] and alternately raise B feet to facilitate threading pants.  Pt completed UB dressing tasks with assistance only to pull her bra around after fastening hooks.  Pt continues to require max A for LB dressing tasks, although pt was able to don her socks (BLE elevated) without assistance this morning.  Pt remained in w/c with all needs within reach.   Therapy Documentation Precautions:  Precautions Precautions: Fall, Other (comment) Precaution Comments: quadriplegia (LUE/LLE more impaired) PRAFOs Restrictions Weight Bearing Restrictions: Yes RLE Weight Bearing: Weight bearing as tolerated LLE Weight Bearing: Weight bearing as tolerated Pain: Pt denies pain  See Function Navigator for Current Functional Status.   Therapy/Group: Individual Therapy  Leroy Libman 01/21/2017, 8:09 AM

## 2017-01-21 NOTE — Progress Notes (Signed)
[place] PHYSICAL MEDICINE & REHABILITATION     PROGRESS NOTE    Subjective/Complaints: Continues to progress. Pain better in right calf.   ROS: pt denies nausea, vomiting, diarrhea, cough, shortness of breath or chest pain    Objective: Vital Signs: Blood pressure 117/73, pulse 60, temperature 98.6 F (37 C), temperature source Oral, resp. rate 18, weight 84.6 kg (186 lb 8.2 oz), last menstrual period 12/31/2016, SpO2 100 %. No results found. No results for input(s): WBC, HGB, HCT, PLT in the last 72 hours. No results for input(s): NA, K, CL, GLUCOSE, BUN, CREATININE, CALCIUM in the last 72 hours.  Invalid input(s): CO CBG (last 3)   Recent Labs  01/20/17 1652 01/20/17 2058 01/21/17 0647  GLUCAP 157* 95 83    Wt Readings from Last 3 Encounters:  01/16/17 84.6 kg (186 lb 8.2 oz)  01/07/17 88.9 kg (196 lb)    Physical Exam:  Constitutional: She appears well-developed. NAD. HENT: Normocephalic and atraumatic.  Eyes: EOMI. No discharge.  Cardiovascular: RRR Respiratory: CTA Bilaterally without wheezes or rales. Normal effort  GI: soft, BS+  Musculoskeletal: She exhibits no edema and mild calf tenderness persists on left Neurological: Alert and oriented  Motor: RUE: 4+/5 proximal to distal LUE: Shoulder abduction 4/5, elbow flex/ext 4/5, hand grip 4/5 RLE: 3+/5 right hip flexion, KE, 4/5 ADF/PF LLE:  2+ to 3/5 proximal to distal ---no real change here Skin: Skin is warm and dry.  Psychiatric: She has a normal mood and affect. Her behavior is normal.   Assessment/Plan: 1. 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 Physiatrist is providing close team supervision and 24 hour management of active medical problems listed below. Physiatrist and rehab team continue to assess barriers to discharge/monitor patient progress  toward functional and medical goals.  Function:  Bathing Bathing position   Position: Wheelchair/chair at sink  Bathing parts Body parts bathed by patient: Right arm, Left arm, Chest, Abdomen, Front perineal area, Buttocks, Right upper leg, Left upper leg Body parts bathed by helper: Left lower leg, Right lower leg  Bathing assist Assist Level: 2 helpers      Upper Body Dressing/Undressing Upper body dressing   What is the patient wearing?: Pull over shirt/dress, Bra Bra - Perfomed by patient: Thread/unthread right bra strap, Thread/unthread left bra strap Bra - Perfomed by helper: Hook/unhook bra (pull down sports bra) Pull over shirt/dress - Perfomed by patient: Thread/unthread right sleeve, Thread/unthread left sleeve, Put head through opening, Pull shirt over trunk Pull over shirt/dress - Perfomed by helper: Pull shirt over trunk        Upper body assist Assist Level: Touching or steadying assistance(Pt > 75%)   Set up : To obtain clothing/put away  Lower Body Dressing/Undressing Lower body dressing   What is the patient wearing?: Underwear, Pants, Socks, Shoes Underwear - Performed by patient: Pull underwear up/down Underwear - Performed by helper: Thread/unthread right underwear leg, Thread/unthread left underwear leg Pants- Performed by patient: Pull pants up/down Pants- Performed by helper: Thread/unthread right pants leg, Thread/unthread left pants leg   Non-skid slipper socks- Performed by helper: Don/doff right sock, Don/doff left sock Socks - Performed by patient: Don/doff right sock, Don/doff left sock Socks - Performed by helper: Don/doff right sock Shoes - Performed by patient: Fasten right, Fasten left Shoes - Performed by helper: Don/doff right shoe, Don/doff left shoe, Fasten right, Fasten left  Lower body assist Assist for lower body dressing: Touching or steadying assistance (Pt > 75%)      Toileting Toileting Toileting activity did not occur:  N/A   Toileting steps completed by helper: Adjust clothing prior to toileting, Performs perineal hygiene, Adjust clothing after toileting Toileting Assistive Devices: Other (comment), Grab bar or rail (stedy)  Toileting assist Assist level: Two helpers   Transfers Chair/bed transfer   Chair/bed transfer method: Stand pivot Chair/bed transfer assist level: Touching or steadying assistance (Pt > 75%) Chair/bed transfer assistive device: Armrests, Walker Mechanical lift: Ecologist Ambulation activity did not occur: Safety/medical concerns (quadriplegia)   Max distance: 40ft Assist level: Touching or steadying assistance (Pt > 75%)   Wheelchair   Type:  (TBD) Max wheelchair distance: 168ft Assist Level: Supervision or verbal cues  Cognition Comprehension Comprehension assist level: Follows complex conversation/direction with extra time/assistive device  Expression Expression assist level: Expresses basic 90% of the time/requires cueing < 10% of the time.  Social Interaction Social Interaction assist level: Interacts appropriately with others - No medications needed.  Problem Solving Problem solving assist level: Solves complex problems: Recognizes & self-corrects  Memory Memory assist level: Complete Independence: No helper   Medical Problem List and Plan: 1.  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   2.  DVT Prophylaxis/Anticoagulation: SCDs.  Left gastroc thrombus---ROM/activity/heat and massage  -f/u doppler unchanged 613 3. Pain Management: Ultram as needed  -heat and stretching for calves. Likely related to exercise and clot in leg 4. Mood: Provide emotional support 5. Neuropsych: This patient is capable of making decisions on her own behalf. 6. Skin/Wound Care: Routine skin checks 7.  Fluids/Electrolytes/Nutrition: Routine I&Os  -encourage PO 8. Seizure prophylaxis. Keppra 500 mg every 12 hours 9. Hypertension.Nimotop as per protocol21 days.      10. Diabetes mellitus.SSI. Monitor closely while on Decadron. Patient on Glucophage 500 mg daily prior to admission.    Resumed glucophage  Overall control is good 11. Obesity. BMI 34.7 KG. 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 E.Coli UTI  Macrobid completed  LOS (Days) 14 A FACE TO FACE EVALUATION WAS PERFORMED  Alger Simons T, MD 01/21/2017 8:45 AM

## 2017-01-21 NOTE — Progress Notes (Signed)
Physical Therapy Session Note  Patient Details  Name: Aimee [name] Plan MRN: [id number] Date of Birth: [birth year]  Today's Date: 01/21/2017 PT Individual Time: 1300-1345 PT Individual Time Calculation (min): 45 min   Short Term Goals: Week 2:  PT Short Term Goal 1 (Week 2): Pt will perform sit>stand with UE support with mod A.  PT Short Term Goal 2 (Week 2): Pt will perform supine<>sit with min A.  PT Short Term Goal 3 (Week 2): Pt will propel manual w/c 173ft with S.  PT Short Term Goal 4 (Week 2): Pt will initiate gait training.  PT Short Term Goal 5 (Week 2): Pt will perform stand pivot transfer with max A+2.   Skilled Therapeutic Interventions/Progress Updates:  Tx focused on therex for strengthenign and motor control, sit<>stand transfers, gait, and stairs. Pt up in [facility], ready to go. Staff pushed WC for time management.  Sit<>stands with mod A to RW from [employer] with momentum and cues for technique.  Gait with RW x100' with Min A overall but PT providing L knee ext 25% of the time. Gait marked by decreased step length, esp with RLE. Mod erate L knee buckling, but pt able to compensate with UE use.  Step-ups to 4" step with 2 rails R/L x5 each with PT blocking L knee for ext. Pt had unanticipated sit to WC at end due to L knee fatigue. Max A needed to come to full sit back in WC . Stair training x2 with step-to pattern on 4" steps with Mod A for blocking L knee and weight shift, step-by-step cues for sequence.  Pt left up in [facility] with all needs in reach.        Therapy Documentation Precautions:  Precautions Precautions: Fall, Other (comment) Precaution Comments: quadriplegia (LUE/LLE more impaired) PRAFOs Restrictions Weight Bearing Restrictions: Yes RLE Weight Bearing: Weight bearing as tolerated LLE Weight Bearing: Weight bearing as tolerated    Vital Signs:   Pain: Pain Assessment Pain Score: 0-No pain   See Function Navigator for Current Functional  Status.   Therapy/Group: Individual Therapy  Tyrihanna Wingert Soundra Pilon, PT, DPT  01/21/2017, 1:11 PM

## 2017-01-21 NOTE — Progress Notes (Signed)
Physical Therapy Session Note  Patient Details  Name: Aimee [name] Plan MRN: [id number] Date of Birth: May 26, 1976  Today's Date: 01/21/2017 PT Individual Time: 0903-1000 PT Individual Time Calculation (min): 57 min   Skilled Therapeutic Interventions/Progress Updates:  Pt received in room & agreeable to tx. Pt performed hand hygiene from w/c level with set up assist. Transported pt room<>gym via w/c total assist for time management. Pt attempted sit>stand with RW but required +2 assist to attempt and then prevent sliding out of w/c as pt unable to clear buttocks and continued to scoot forward in seat. Transitioned to sit<>stand transfers with use of parallel bars; pt then able to complete with min assist with therapist approximating at L knee and providing tactile cuing for technique & min assist overall. Pt performed multiple sit<>stand transfers for strengthening purposes but only able to perform about 3 at a time before requiring a seated rest break. Pt relies very heavily on BUE to assist with sit<>stand transfers. Transitioned to having pt perform mini squats with task focusing on BLE strengthening but pt unable to achieve proper technique despite max multimodal cuing, demonstration, and use of mirror for visual aid. Transitioned to rail in hallway and pt with slight improvement in technique but reports increased fatigue. At end of session pt left sitting in w/c in room with all needs within reach.   Therapy Documentation Precautions:  Precautions Precautions: Fall, Other (comment) Precaution Comments: quadriplegia (LUE/LLE more impaired) PRAFOs Restrictions Weight Bearing Restrictions: Yes RLE Weight Bearing: Weight bearing as tolerated LLE Weight Bearing: Weight bearing as tolerated  Pain: Pt denies pain.  See Function Navigator for Current Functional Status.   Therapy/Group: Individual Therapy  Waunita Schooner 01/21/2017, 12:30 PM

## 2017-01-21 NOTE — Progress Notes (Signed)
Occupational Therapy Note  Patient Details  Name: Aimee [name] MRN: [id number] Date of Birth: [birth year]  Today's Date: 01/21/2017 OT Individual Time: 1400-1430 OT Individual Time Calculation (min): 30 min   Pt denies pain Individual Therapy  Pt resting in w/c upon arrival. Focus on LUE tasks with theraputty, coins, and deck of cards with emphasis on grasp strength, dexterity, and manipulation.  Pt continues to demonstrate increased use of LUE in all tasks.  Discussed alternate methods for donning bra to increase independence with BADLs. Pt remained in w/c with all needs within reach.   [name] [facility] 01/21/2017, 2:52 PM

## 2017-01-21 NOTE — Progress Notes (Signed)
Client did not show signs of any type of distress during my shift. Even/unlabored breathing. Tolerated medications well.

## 2017-01-22 ENCOUNTER — Inpatient Hospital Stay (HOSPITAL_COMMUNITY): Payer: Self-pay | Admitting: Physical Therapy

## 2017-01-22 ENCOUNTER — Inpatient Hospital Stay (HOSPITAL_COMMUNITY): Payer: Self-pay | Admitting: *Deleted

## 2017-01-22 LAB — CBC
HCT: 39.3 % (ref 36.0–46.0)
Hemoglobin: 12.8 g/dL (ref 12.0–15.0)
MCH: 28.7 pg (ref 26.0–34.0)
MCHC: 32.6 g/dL (ref 30.0–36.0)
MCV: 88.1 fL (ref 78.0–100.0)
Platelets: 162 10*3/uL (ref 150–400)
RBC: 4.46 MIL/uL (ref 3.87–5.11)
RDW: 17.4 % — ABNORMAL HIGH (ref 11.5–15.5)
WBC: 8.6 10*3/uL (ref 4.0–10.5)

## 2017-01-22 LAB — GLUCOSE, CAPILLARY
Glucose-Capillary: 75 mg/dL (ref 65–99)
Glucose-Capillary: 98 mg/dL (ref 65–99)
Glucose-Capillary: 91 mg/dL (ref 65–99)
Glucose-Capillary: 83 mg/dL (ref 65–99)

## 2017-01-22 NOTE — Progress Notes (Signed)
Physical Therapy Weekly Progress Note  Patient Details  Name: Aimee [name] Plan MRN: [id number] Date of Birth: 22-Nov-1975  Beginning of progress report period: January 15, 2017 End of progress report period: January 22, 2017  Today's Date: 01/22/2017 PT Individual Time: 0900-1000 PT Individual Time Calculation (min): 60 min   Patient has met 5 of 5 short term goals.  Pt min A with bed mobility. Pt varies from min>max A with sit>stand depending on pt's level of fatigue and height of surface. Pt has initiated gait training and is currently min A with ambulation using RW but requires verbal cues for gait pattern and increased step length and occasional manual facilitation to prevent L knee buckling. Pt shows improved ability to turn when ambulating and performing stand pivot tranfers with RW and min A. Pt able to propel manual w/c with S and verbal cues to reach UEs back to decrease push frequency. Pt will require further LE strengthening and balance and gait training before discharge.   Patient continues to demonstrate the following deficits muscle weakness, decreased cardiorespiratoy endurance, impaired timing and sequencing, unbalanced muscle activation, motor apraxia, decreased coordination and decreased motor planning and decreased standing balance, decreased postural control and decreased balance strategies and therefore will continue to benefit from skilled PT intervention to increase functional independence with mobility.  Patient progressing toward LTGs. See goal revision..  Plan of care revisions: goals upgraded to S bed mobility, transfers, and ambulation and min A stair negotiation due to pt progress.  PT Short Term Goals Week 2:  PT Short Term Goal 1 (Week 2): Pt will perform sit>stand with UE support with mod A.  PT Short Term Goal 1 - Progress (Week 2): Met PT Short Term Goal 2 (Week 2): Pt will perform supine<>sit with min A.  PT Short Term Goal 2 - Progress (Week 2): Met PT Short  Term Goal 3 (Week 2): Pt will propel manual w/c 120f with S.  PT Short Term Goal 3 - Progress (Week 2): Met PT Short Term Goal 4 (Week 2): Pt will initiate gait training.  PT Short Term Goal 4 - Progress (Week 2): Met PT Short Term Goal 5 (Week 2): Pt will perform stand pivot transfer with max A+2.  PT Short Term Goal 5 - Progress (Week 2): Met Week 3:  PT Short Term Goal 1 (Week 3): Pt will consistently perform sit>stand with UE support and min A.  PT Short Term Goal 2 (Week 3): Pt will ambulate 1043fwith LRAD and min guard.  PT Short Term Goal 3 (Week 3): Pt will perform bed mobility with S from without use of bedrails.  PT Short Term Goal 4 (Week 3): Pt will perform stand pivot transfer with LRAD and min guard.   Skilled Therapeutic Interventions/Progress Updates:  Pt received seated in tilt in space w/c with no reports of pain and agreeable to tx. Total A to rehab gym. Sit>stand from tilt in space w/c in parallel bars. Lateral walking x3 each direction with min A and B UE support on parallel bars; verbal cues to keep feet facing forward and to step L foot back, as pt showed a tendency to step L LE forward when laterally stepping to her R. Manual facilitation at L knee to prevent buckling during lateral walking. Min A sit>stand from w/c using RW; pt ambulated 6060fith RW with min A and verbal cues for gait pattern, RW management, and larger step length with R LE. Pt cued to walk half as  far as she could in order to be able to turn around and walk back to her w/c. Pt ambulated 69f weaving around cones using RW with min A with noted shortened step length; verbal cues for technique and larger step length. Pt performed step tap x1 with R LE but her L knee buckled when attempted to step R LE back onto floor requiring max A +2 to position back in w/c. Pt attempted sit>stand but LE buckled and pt scooted forward in chair requiring max A +2 to position back in w/c. Pt reported no pain after this sit>stand  attempt but LE fatigue. Pt performed sit>stand from w/c using RW with manual facilitation at B LE to provide increased stability, prevent knee buckling, and improve pt confidence with sit>stand. Pt performed clothespin activity seated in manual w/c with L UE. Pt total A back to room where she remained seated in tilt in space w/c with all needs met.   Therapy Documentation Precautions:  Precautions Precautions: Fall, Other (comment) Precaution Comments: quadriplegia (LUE/LLE more impaired) PRAFOs Restrictions Weight Bearing Restrictions: Yes RLE Weight Bearing: Weight bearing as tolerated LLE Weight Bearing: Weight bearing as tolerated   See Function Navigator for Current Functional Status.  Therapy/Group: Individual Therapy  CAlysia Penna6/19/2018, 11:27 AM

## 2017-01-22 NOTE — Progress Notes (Signed)
[facility]IC[facility] & REHABILITATION     PROGRESS NOTE    Subjective/Complaints: Up in shower with OT. No new problems this morning.  ROS: pt denies nausea, vomiting, diarrhea, cough, shortness of breath or chest pain     Objective: Vital Signs: Blood pressure 120/72, pulse 65, temperature 98.2 F (36.8 C), temperature source Oral, resp. rate 18, weight 84.6 kg (186 lb 8.2 oz), last menstrual period 12/31/2016, SpO2 98 %. No results found.  Recent Labs  01/22/17 0712  WBC 8.6  HGB 12.8  HCT 39.3  PLT 162   No results for input(s): NA, K, CL, GLUCOSE, BUN, CREATININE, CALCIUM in the last 72 hours.  Invalid input(s): CO CBG (last 3)   Recent Labs  01/21/17 1626 01/21/17 2053 01/22/17 0640  GLUCAP 98 153* 75    Wt Readings from Last 3 Encounters:  01/16/17 84.6 kg (186 lb 8.2 oz)  01/07/17 88.9 kg (196 lb)    Physical Exam:  Constitutional: She appears well-developed. NAD. HENT: Normocephalic and atraumatic.  Eyes: EOMI. No discharge.  Cardiovascular: RRR Respiratory: CTA Bilaterally without wheezes or rales. Normal effort   GI: soft, BS+  Musculoskeletal: She exhibits no edema and mild calf tenderness persists on left Neurological: Alert and oriented  Motor: RUE: 4+/5 proximal to distal LUE: Shoulder abduction 4/5, elbow flex/ext 4/5, hand grip 4/5 RLE: 3+/5 right hip flexion, KE, 4/5 ADF/PF LLE:  2+ to 3/5 proximal to distal --stable Skin: Skin is warm and dry.  Psychiatric: She has a normal mood and affect. Her behavior is normal.   Assessment/Plan: 1. 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 Physiatrist is providing close team supervision and 24 hour management of active medical problems listed below. Physiatrist and rehab team continue to assess barriers to discharge/monitor patient progress  toward functional and medical goals.  Function:  Bathing Bathing position   Position: Wheelchair/chair at sink  Bathing parts Body parts bathed by patient: Right arm, Left arm, Chest, Abdomen, Front perineal area, Buttocks, Right upper leg, Left upper leg Body parts bathed by helper: Left lower leg, Right lower leg  Bathing assist Assist Level: 2 helpers      Upper Body Dressing/Undressing Upper body dressing   What is the patient wearing?: Pull over shirt/dress, Bra Bra - Perfomed by patient: Thread/unthread right bra strap, Thread/unthread left bra strap Bra - Perfomed by helper: Hook/unhook bra (pull down sports bra) Pull over shirt/dress - Perfomed by patient: Thread/unthread right sleeve, Thread/unthread left sleeve, Put head through opening, Pull shirt over trunk Pull over shirt/dress - Perfomed by helper: Pull shirt over trunk        Upper body assist Assist Level: Touching or steadying assistance(Pt > 75%)   Set up : To obtain clothing/put away  Lower Body Dressing/Undressing Lower body dressing   What is the patient wearing?: Underwear, Pants, Socks, Shoes Underwear - Performed by patient: Pull underwear up/down Underwear - Performed by helper: Thread/unthread right underwear leg, Thread/unthread left underwear leg Pants- Performed by patient: Pull pants up/down Pants- Performed by helper: Thread/unthread right pants leg, Thread/unthread left pants leg   Non-skid slipper socks- Performed by helper: Don/doff right sock, Don/doff left sock Socks - Performed by patient: Don/doff right sock, Don/doff left sock Socks - Performed by helper: Don/doff right sock Shoes - Performed by patient: Fasten right, Fasten left Shoes - Performed by helper: Don/doff right shoe, Don/doff  left shoe, Fasten right, Fasten left          Lower body assist Assist for lower body dressing: Touching or steadying assistance (Pt > 75%)      Toileting Toileting Toileting activity did not occur:  N/A   Toileting steps completed by helper: Adjust clothing prior to toileting, Performs perineal hygiene, Adjust clothing after toileting Toileting Assistive Devices: Other (comment) (stedy)  Toileting assist Assist level: Two helpers   Transfers Chair/bed transfer   Chair/bed transfer method: Stand pivot Chair/bed transfer assist level: Touching or steadying assistance (Pt > 75%) Chair/bed transfer assistive device: Armrests, Walker Mechanical lift: Architectural technologist activity did not occur: Safety/medical concerns (quadriplegia)   Max distance: 100 Assist level: Touching or steadying assistance (Pt > 75%)   Wheelchair   Type:  (TBD) Max wheelchair distance: 176ft Assist Level: Supervision or verbal cues  Cognition Comprehension Comprehension assist level: Follows complex conversation/direction with extra time/assistive device  Expression Expression assist level: Expresses basic 90% of the time/requires cueing < 10% of the time.  Social Interaction Social Interaction assist level: Interacts appropriately with others - No medications needed.  Problem Solving Problem solving assist level: Solves complex problems: Recognizes & self-corrects  Memory Memory assist level: Complete Independence: No helper   Medical Problem List and Plan: 1.  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  -team conference today   2.  DVT Prophylaxis/Anticoagulation: SCDs.  Left gastroc thrombus---ROM/activity/heat and massage  -f/u doppler unchanged 613 3. Pain Management: Ultram as needed  -heat and stretching for left calf. Likely related to exercise/thrombophlebitis 4. Mood: Provide emotional support 5. Neuropsych: This patient is capable of making decisions on her own behalf. 6. Skin/Wound Care: Routine skin checks 7.  Fluids/Electrolytes/Nutrition: Routine I&Os  -encourage PO 8. Seizure prophylaxis. Keppra 500 mg every 12 hours 9. Hypertension.Nimotop as per protocol21 days.      10. Diabetes mellitus.SSI. Monitor closely while on Decadron. Patient on Glucophage 500 mg daily prior to admission.    Resumed glucophage with good control 11. Obesity. BMI 34.7 KG. 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 Labs reviewed  -weaning off steroids 13. E.Coli UTI  Macrobid completed  LOS (Days) 15 A FACE TO FACE EVALUATION WAS PERFORMED  Meredith Staggers, MD 01/22/2017 8:31 AM

## 2017-01-22 NOTE — Progress Notes (Signed)
Occupational Therapy Session Note  Patient Details  Name: Aimee [name] MRN: [id number] Date of Birth: 1976-06-15  Today's Date: 01/22/2017 OT Individual Time: 0700-0800 OT Individual Time Calculation (min): 60 min    Short Term Goals: Week 2:  OT Short Term Goal 1 (Week 2): Pt will perform sit<>stand with mod A from w/c in preparation for pulling up pants OT Short Term Goal 2 (Week 2): Pt will thread BLE into pants with min A OT Short Term Goal 3 (Week 2): Pt will don socks with min A OT Short Term Goal 4 (Week 2): Pt will perform toilet transfers with mod A  Skilled Therapeutic Interventions/Progress Updates:    Pt engaged in BADL retraining including bathing at shower level and dressing with sit<>stand from w/c with Stedy.  Pt completes all bathing tasks seated on tub bench with assist only for bathing buttocks.  Pt required max A for LB dressing tasks.  Pt is able to stand with min A in Stedy when pulling up pants.  Pt requires more than a reasonable amount of time to complete tasks.  Pt demonstrates increased use of LUE in all BADLs.    Therapy Documentation Precautions:  Precautions Precautions: Fall, Other (comment) Precaution Comments: quadriplegia (LUE/LLE more impaired) PRAFOs Restrictions Weight Bearing Restrictions: Yes RLE Weight Bearing: Weight bearing as tolerated LLE Weight Bearing: Weight bearing as tolerated   Pain: Pain Assessment Pain Assessment: No/denies pain  See Function Navigator for Current Functional Status.   Therapy/Group: Individual Therapy  Leroy Libman 01/22/2017, 8:58 AM

## 2017-01-22 NOTE — Progress Notes (Signed)
Occupational Therapy Note  Patient Details  Name: Aimee [name] MRN: [id number] Date of Birth: 1976-07-05  Today's Date: 01/22/2017 OT Individual Time: 1300-1355 OT Individual Time Calculation (min): 55 min   Pt denied pain Individual Therapy  Focus on doffing/donning socks and shoes seated EOB.  Pt is able to bring feet to edge of bed to access socks and shoes.  Pt requires more than a reasonable amount of time to complete task.  Recommended pt purchase a small step stool for use at home to facilitate ease in fastening shoes.  Pt also transferred to toilet to complete toileting tasks with min A.  Pt transitioned to LUE table activities with focus on grip strength and dexterity.  Pt continues to demonstrate improved/increased use of LUE in all funcitonal tasks.    [name] 01/22/2017, 2:01 PM

## 2017-01-22 NOTE — Plan of Care (Signed)
Problem: RH Balance Goal: LTG Patient will maintain dynamic standing balance (PT) LTG:  Patient will maintain dynamic standing balance with assistance during mobility activities (PT)  Upgraded due to progress  Problem: RH Bed Mobility Goal: LTG Patient will perform bed mobility with assist (PT) LTG: Patient will perform bed mobility with assistance, with/without cues (PT).  Upgraded due to progress  Problem: RH Bed to Chair Transfers Goal: LTG Patient will perform bed/chair transfers w/assist (PT) LTG: Patient will perform bed/chair transfers with assistance, with/without cues (PT).  Upgraded due to progress  Problem: RH Car Transfers Goal: LTG Patient will perform car transfers with assist (PT) LTG: Patient will perform car transfers with assistance (PT).  upgraded due to progress  Problem: RH Stairs Goal: LTG Patient will ambulate up and down stairs w/assist (PT) LTG: Patient will ambulate up and down # of stairs with assistance (PT)  upgraded due to progress  Problem: RH Ambulation Goal: LTG Patient will ambulate in controlled environment (PT) LTG: Patient will ambulate in a controlled environment, # of feet with assistance (PT).  upgraded due to progress

## 2017-01-22 NOTE — Progress Notes (Signed)
Physical Therapy Session Note  Patient Details  Name: Aimee [name] MRN: [id number] Date of Birth: 04-18-1976  Today's Date: 01/22/2017 PT Individual Time: 2904-7533 PT Individual Time Calculation (min): 30 min   Short Term Goals: Week 2:  PT Short Term Goal 1 (Week 2): Pt will perform sit>stand with UE support with mod A.  PT Short Term Goal 2 (Week 2): Pt will perform supine<>sit with min A.  PT Short Term Goal 3 (Week 2): Pt will propel manual w/c 127f with S.  PT Short Term Goal 4 (Week 2): Pt will initiate gait training.  PT Short Term Goal 5 (Week 2): Pt will perform stand pivot transfer with max A+2.   Skilled Therapeutic Interventions/Progress Updates:    no c/o pain, fatigued from previous therapy sessions.  Session focus on hip strengthening.  Pt transitions from w/c to tall kneeling with +2 HHA and facilitation for LLE onto mat.  2x10 minisquats in tall kneeling focus on eccentric control and terminal hip extension on the rise.  2x10 alternating LE lifts in modified quadruped position with therapist facilitating appropriate pelvis positioning to target glutes, hamstrings, and hip flexors.  Pt returned to w/c at end of session in same manner as above, returned to room and positioned upright in w/c with call bell in reach and needs met.   Therapy Documentation Precautions:  Precautions Precautions: Fall, Other (comment) Precaution Comments: quadriplegia (LUE/LLE more impaired) PRAFOs Restrictions Weight Bearing Restrictions: Yes RLE Weight Bearing: Weight bearing as tolerated LLE Weight Bearing: Weight bearing as tolerated   See Function Navigator for Current Functional Status.   Therapy/Group: Individual Therapy  CEarnest ConroyPenven-Crew 01/22/2017, 12:13 PM

## 2017-01-23 ENCOUNTER — Inpatient Hospital Stay (HOSPITAL_COMMUNITY): Payer: Self-pay | Admitting: *Deleted

## 2017-01-23 ENCOUNTER — Inpatient Hospital Stay (HOSPITAL_COMMUNITY): Payer: Self-pay

## 2017-01-23 ENCOUNTER — Inpatient Hospital Stay (HOSPITAL_COMMUNITY): Payer: Self-pay | Admitting: Physical Therapy

## 2017-01-23 LAB — GLUCOSE, CAPILLARY
Glucose-Capillary: 86 mg/dL (ref 65–99)
Glucose-Capillary: 95 mg/dL (ref 65–99)
Glucose-Capillary: 126 mg/dL — ABNORMAL HIGH (ref 65–99)
Glucose-Capillary: 95 mg/dL (ref 65–99)

## 2017-01-23 MED ORDER — DEXAMETHASONE 2 MG PO TABS
1.0000 mg | ORAL_TABLET | Freq: Two times a day (BID) | ORAL | Status: AC
  Administered 2017-01-23 – 2017-01-25 (×5): 1 mg via ORAL
  Filled 2017-01-23 (×7): qty 1

## 2017-01-23 NOTE — Progress Notes (Signed)
Physical Therapy Session Note  Patient Details  Name: Aimee [name] MRN: [id number] Date of Birth: Oct 13, 1975  Today's Date: 01/23/2017 PT Individual Time: 0900-1000; 1430-1530 PT Individual Time Calculation (min): 60 min; 60 min   Short Term Goals: Week 3:  PT Short Term Goal 1 (Week 3): Pt will consistently perform sit>stand with UE support and min A.  PT Short Term Goal 2 (Week 3): Pt will ambulate 126f with LRAD and min guard.  PT Short Term Goal 3 (Week 3): Pt will perform bed mobility with S from without use of bedrails.  PT Short Term Goal 4 (Week 3): Pt will perform stand pivot transfer with LRAD and min guard.   Skilled Therapeutic Interventions/Progress Updates:  Tx 1: Pt received seated in tilt in space w/c with no reports of pain and agreeable to treatment. Total A to rehab gym. Stand pivot transfer from w/c>mat using RW with min A and minimal verbal cues for technique/sequencing. Pt performed sit>stand from mat table without use of RW and variable assistance/technique throughout session starting with mod A faded to min A with pt primarily using R UE and R LE; asked pt to place R UE on L knee to facilitate use of L LE during sit>stand with min A +2. Pt performed anterior stepping and return to midline with R LE without use of AD with manual facilitation at L knee and hip to promote extension and L weight shift and prevent L knee buckling. Pt started this activity with +2 A for increased stability and faded to mod A +1. Verbal and tactile cues to promote pattern of L knee and hip extension>L weight shift>step forward/backward with R LE. R LE step taps onto 3' step with same verbal and tactile cueing. Pt had 1 instance of knee buckling requiring her to return to seated on the mat. Stand pivot transfer with min A to return to w/c. Pt left seated in tilt in space w/c with all needs met.    Tx 2: Pt received seated in tilt in space w/c. Total A to hallway outside of rehab gym. Pt  ambulated 468 with RW with min A with occasional manual facilitation at L knee to prevent buckling. Pt performed lateral step ups on 3' stair with min A with manual facilitation at L knee to prevent buckling and to unlock the knee during eccentric controlled lowering x 2 trials. Pt negotiated 8 3"stairs x 2 with B rails and min A with manual facilitation at L knee to prevent buckling. Pt led with L LE during stair ascent and led with R LE on stair descent; verbal cues for controlled lowering and foot placement on stairs. Pt ambulated 1076fwith RW with min A faded to min guard with manual facilitation at L knee to prevent buckling prn. Pt ambulated [id number] day room with RW; approximately [id number] on carpet. Pt required verbal cues for gait pattern and longer step length with R LE throughout all ambulation trials. Pt total A to pt's room where she remained seated in tilt in space w/c with all needs met.     Therapy Documentation Precautions:  Precautions Precautions: Fall, Other (comment) Precaution Comments: quadriplegia (LUE/LLE more impaired) PRAFOs Restrictions Weight Bearing Restrictions: Yes RLE Weight Bearing: Weight bearing as tolerated LLE Weight Bearing: Weight bearing as tolerated   See Function Navigator for Current Functional Status.   Therapy/Group: Individual Therapy  CaroAlysia Penna0/2018, 4:20 PM

## 2017-01-23 NOTE — Progress Notes (Signed)
[facility]ICAL MEDICINE & REHABILITATION     PROGRESS NOTE    Subjective/Complaints: No new complaints. Feels stronger. Frustrated that she's not able to hold weight in transferring that well yet.   ROS: pt denies nausea, vomiting, diarrhea, cough, shortness of breath or chest pain   Objective: Vital Signs: Blood pressure 114/70, pulse (!) 59, temperature 97.7 F (36.5 C), temperature source Oral, resp. rate 18, weight 85.3 kg (188 lb 0.8 oz), last menstrual period 12/31/2016, SpO2 100 %. No results found.  Recent Labs  01/22/17 0712  WBC 8.6  HGB 12.8  HCT 39.3  PLT 162   No results for input(s): NA, K, CL, GLUCOSE, BUN, CREATININE, CALCIUM in the last 72 hours.  Invalid input(s): CO CBG (last 3)   Recent Labs  01/22/17 1632 01/22/17 2109 01/23/17 0640  GLUCAP 91 83 86    Wt Readings from Last 3 Encounters:  01/23/17 85.3 kg (188 lb 0.8 oz)  01/07/17 88.9 kg (196 lb)    Physical Exam:  Constitutional: She appears well-developed. NAD. HENT: Normocephalic and atraumatic.  Eyes: EOMI. No discharge.  Cardiovascular: RRR without murmur. No JVD  Respiratory: CTA Bilaterally without wheezes or rales. Normal effort  GI: soft, BS+  Musculoskeletal: She exhibits no edema and mild calf tenderness persists on left Neurological: Alert and oriented  Motor: RUE: 4+/5 proximal to distal LUE: Shoulder abduction 4/5, elbow flex/ext 4/5, hand grip 4/5 RLE: 3+/5 right hip flexion, KE, 4/5 ADF/PF LLE:  3-/5 proximal to 3/5 distal -intact light touch and pain in all 4's Skin: Skin is warm and dry.  Psychiatric: She has a normal mood and affect. Her behavior is normal.   Assessment/Plan: 1. 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 Physiatrist is providing close team supervision and 24 hour management of active medical  problems listed below. Physiatrist and rehab team continue to assess barriers to discharge/monitor patient progress toward functional and medical goals.  Function:  Bathing Bathing position   Position: Wheelchair/chair at sink  Bathing parts Body parts bathed by patient: Right arm, Left arm, Chest, Abdomen, Front perineal area, Buttocks, Right upper leg, Left upper leg Body parts bathed by helper: Left lower leg, Right lower leg  Bathing assist Assist Level: 2 helpers      Upper Body Dressing/Undressing Upper body dressing   What is the patient wearing?: Pull over shirt/dress, Bra Bra - Perfomed by patient: Thread/unthread right bra strap, Thread/unthread left bra strap Bra - Perfomed by helper: Hook/unhook bra (pull down sports bra) Pull over shirt/dress - Perfomed by patient: Thread/unthread right sleeve, Thread/unthread left sleeve, Put head through opening, Pull shirt over trunk Pull over shirt/dress - Perfomed by helper: Pull shirt over trunk        Upper body assist Assist Level: Touching or steadying assistance(Pt > 75%)   Set up : To obtain clothing/put away  Lower Body Dressing/Undressing Lower body dressing   What is the patient wearing?: Underwear, Pants, Socks, Shoes Underwear - Performed by patient: Pull underwear up/down Underwear - Performed by helper: Thread/unthread right underwear leg, Thread/unthread left underwear leg Pants- Performed by patient: Pull pants up/down Pants- Performed by helper: Thread/unthread right pants leg, Thread/unthread left pants leg   Non-skid slipper socks- Performed by helper: Don/doff right sock, Don/doff left sock Socks - Performed by patient: Don/doff right sock, Don/doff left sock Socks - Performed by helper: Don/doff right sock  Shoes - Performed by patient: Fasten right, Fasten left Shoes - Performed by helper: Don/doff right shoe, Don/doff left shoe, Fasten right, Fasten left          Lower body assist Assist for lower body  dressing: Touching or steadying assistance (Pt > 75%)      Toileting Toileting Toileting activity did not occur: N/A Toileting steps completed by patient: Performs perineal hygiene Toileting steps completed by helper: Adjust clothing prior to toileting, Adjust clothing after toileting Toileting Assistive Devices:  (stedy)  Toileting assist Assist level: Two helpers   Transfers Chair/bed transfer   Chair/bed transfer method: Stand pivot, Ambulatory Chair/bed transfer assist level: Touching or steadying assistance (Pt > 75%) Chair/bed transfer assistive device: Walker, Armrests Mechanical lift: Ecologist Ambulation activity did not occur: Safety/medical concerns (quadriplegia)   Max distance: 2ft Assist level: Touching or steadying assistance (Pt > 75%)   Wheelchair   Type:  (TBD) Max wheelchair distance: 169ft Assist Level: Supervision or verbal cues  Cognition Comprehension Comprehension assist level: Follows complex conversation/direction with extra time/assistive device  Expression Expression assist level: Expresses basic 90% of the time/requires cueing < 10% of the time.  Social Interaction Social Interaction assist level: Interacts appropriately with others - No medications needed.  Problem Solving Problem solving assist level: Solves complex problems: Recognizes & self-corrects  Memory Memory assist level: Complete Independence: No helper   Medical Problem List and Plan: 1.  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  -ELOS 7/3   2.  DVT Prophylaxis/Anticoagulation: SCDs.  Left gastroc thrombus---ROM/activity/heat and massage  -f/u doppler unchanged 613 3. Pain Management: Ultram as needed  -heat and stretching for left calf. Likely related to exercise/thrombophlebitis 4. Mood: Provide  emotional support 5. Neuropsych: This patient is capable of making decisions on her own behalf. 6. Skin/Wound Care: Routine skin checks 7. Fluids/Electrolytes/Nutrition: Routine I&Os  -encourage PO 8. Seizure prophylaxis. Keppra 500 mg every 12 hours 9. Hypertension/vasospasm.Nimotop  21 days.      10. Diabetes mellitus.  Resumed glucophage 500mg  qd with good control 11. Obesity. BMI 34.7 KG. 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 E.Coli UTI  Macrobid completed  LOS (Days) 16 A FACE TO FACE EVALUATION WAS PERFORMED  Alger Simons T, MD 01/23/2017 8:00 AM

## 2017-01-23 NOTE — Patient Care Conference (Signed)
Inpatient RehabilitationTeam Conference and [name] of Care Update Date: 01/22/2017   Time: 2:10 PM    Patient Name: Aimee [name]      Medical Record Number: [id number]  Date of Birth: 21-Apr-1976 Sex: Female         Room/Bed: 4W12C/4W12C-01 Payor Info: Payor: /    Admitting Diagnosis: SAH sp [place] Multiple B CVA  Admit Date/Time:  01/07/2017  6:56 PM Admission Comments: No comment available   Primary Diagnosis:  Subarachnoid hemorrhage due to ruptured aneurysm [facility]) Principal Problem: Subarachnoid hemorrhage due to ruptured aneurysm [facility])  Patient Active Problem List   Diagnosis Date Noted  . Thrombus   . E-coli UTI   . Reactive hypertension   . Seizure prophylaxis   . Quadriparesis ([place])   . Class 1 obesity due to excess calories with serious comorbidity and body mass index (BMI) of 30.0 to 30.9 in adult   . Benign essential HTN   . Diabetes mellitus type 2 in obese ([place])   . Leukocytosis   . Acute encephalopathy   . Somnolence   . Seizure ([place])   . Stupor   . Ventilator dependent ([place])   . SAH (subarachnoid hemorrhage) ([place]) 12/25/2016  . Subarachnoid hemorrhage due to ruptured aneurysm ([place]) 12/25/2016    Expected Discharge Date: Expected Discharge Date: 02/05/17  Team Members Present: Physician leading conference: Dr. Alger Simons Social Worker Present: Lennart Pall, LCSW Nurse Present: Other (comment) Blair Heys, RN) PT Present: Kem Parkinson, PT OT Present: Roanna Epley, [place], OT SLP Present: Weston Anna, SLP PPS Coordinator present : Daiva Nakayama, RN, CRRN     Current Status/Progress Goal Weekly Team Focus  Medical   left leg still weak. left calf pain, no change in DVT  increase functional use of left leg  pain control, dvt   Bowel/Bladder   Continent of bowel and bladder LBM 01-20-17  Remain continentn of bowel and bladder maintain regular bowel pattern  Assist iwth tolieting needs   Swallow/Nutrition/ Hydration              ADL's   bathing-mod A, UB dressing-min A; LB dressing-max A; much improved BUE stregth and functionality; functional transfers- mod A with mechanical lift  min A/mod A overall  sit<>stand, functional transfers, BADL retraining, BUE NMR   Mobility   variable assistance with sit>stand min-max A, min A ambulation and stand pivot transfers with RW, min A bed mobility, S w/c propulsion   upgraded to S bed mobility, transfers, and ambulation, min A 1 stair   sit>stand, stand pivot transfers, ambulation with LRAD, LE strengthening, bed mobility    Communication             Safety/Cognition/ Behavioral Observations            Pain   no c/o pain   0  Assess pain q shift and prn   Skin   CDI  no skin breakdown   Assess skin q shift and prn      *See Care [name] and progress notes for long and short-term goals.  Barriers to Discharge: left lower extremity weakess    Possible Resolutions to Barriers:  continued therapy for strength and compensatio    Discharge Planning/Teaching Needs:  [name] to d/c home with family providing 24/7 assistance.  [name] to go to sister-in-law's home which is one level and accessible for w/c.  Teaching to be scheduled when appropriate to begin.   Team Discussion:  Medically doing well;  Known calf DVT.  Making good gains with PT/OT. Still working on sit - stand which can be up to max assist dependent on surface height and fatigue. Once up she is only min assist with ambulation. Making excellent progress.  Revisions to Treatment [name]:  None   Continued Need for Acute Rehabilitation Level of Care: The patient requires daily medical management by a physician with specialized training in physical medicine and rehabilitation for the following conditions: Daily direction of a multidisciplinary physical rehabilitation program to ensure safe treatment while eliciting the highest outcome that is of practical value to the patient.: Yes Daily medical management of patient  stability for increased activity during participation in an intensive rehabilitation regime.: Yes Daily analysis of laboratory values and/or radiology reports with any subsequent need for medication adjustment of medical intervention for : Neurological problems;Other  Aimee [name] 01/23/2017, 8:24 AM

## 2017-01-23 NOTE — Progress Notes (Signed)
Social Work Patient ID: Aimee [name], female   DOB: 02-Feb-1976, 41 y.o.   MRN: [id number]   Have reviewed team conference with pt who continues to be pleased with progress.  Good awareness of her continued deficits and hopes to improve her sit to stand movements.  She is aware we continue to aim for 7/3 discharge.  Discussed need to begin focusing on family education next week and she is agreeable. I will also discuss this with her brother when he is here.  Aimee Archibald, LCSW

## 2017-01-23 NOTE — Progress Notes (Signed)
Occupational Therapy Note  Patient Details  Name: Aimee [name] Plan MRN: [id number] Date of Birth: 11-Apr-1976  Today's Date: 01/23/2017 OT Individual Time: 1300-1330 OT Individual Time Calculation (min): 30 min   Pt denies pain Individual Therapy  Pt resting in w/c upon arrival.  Focus on doffing/donning socks and shoes while seated in w/c.  Emphasis on crossing legs to facilitate ease of task.  Pt was able to complete doffing/donning without assistance and more than a reasonable amount of time.  Pt also engaged in repositioning in w/c with BUE to lift buttocks and reposition.  Pt remained in w/c with all needs within reach.    [name] [facility] 01/23/2017, 1:49 PM

## 2017-01-23 NOTE — Progress Notes (Signed)
Occupational Therapy Session Note  Patient Details  Name: Aimee [name] MRN: [id number] Date of Birth: 06/16/1976  Today's Date: 01/23/2017 OT Individual Time: 8416-6063 OT Individual Time Calculation (min): 55 min    Short Term Goals: Week 2:  OT Short Term Goal 1 (Week 2): Pt will perform sit<>stand with mod A from w/c in preparation for pulling up pants OT Short Term Goal 2 (Week 2): Pt will thread BLE into pants with min A OT Short Term Goal 3 (Week 2): Pt will don socks with min A OT Short Term Goal 4 (Week 2): Pt will perform toilet transfers with mod A  Skilled Therapeutic Interventions/Progress Updates:    Pt resting in bed upon arrival.  Pt performed supine>sit EOB at supervision level in preparation for sit<>stand with Stedy to complete bathing of buttocks and periarea.  Pt able to stand in [place] with LUE support while performing bathing tasks with RUE.  Pt returned to sitting EOB to thread underpants and pants. Pt able to thread BLE into underpants and pants while seated EOB.  Pt performed sit<>stand from EOB at supervision level and initiated pulling up pants/underpants requiring assistance with pulling up pants.  Pt completed UB bathing/dressing tasks with assistance only to fasten bra.  Pt attempted to don her bra after fastened (sports bra technique) but was unsuccessful.  Pt stated she would have her brother purchase a sports bra to try.  Focus on BADL retraining with emphasis on dressing tasks while seated EOB and in w/c.  Pt remained in w/c with all needs within reach.   Therapy Documentation Precautions:  Precautions Precautions: Fall, Other (comment) Precaution Comments: quadriplegia (LUE/LLE more impaired) PRAFOs Restrictions Weight Bearing Restrictions: No RLE Weight Bearing: Weight bearing as tolerated LLE Weight Bearing: Weight bearing as tolerated Pain:  Pt denies pain  See Function Navigator for Current Functional Status.   Therapy/Group: Individual  Therapy  Leroy Libman 01/23/2017, 8:11 AM

## 2017-01-24 ENCOUNTER — Inpatient Hospital Stay (HOSPITAL_COMMUNITY): Payer: Self-pay

## 2017-01-24 ENCOUNTER — Inpatient Hospital Stay (HOSPITAL_COMMUNITY): Payer: Self-pay | Admitting: Physical Therapy

## 2017-01-24 DIAGNOSIS — R269 Unspecified abnormalities of gait and mobility: Secondary | ICD-10-CM

## 2017-01-24 DIAGNOSIS — I69398 Other sequelae of cerebral infarction: Secondary | ICD-10-CM

## 2017-01-24 LAB — GLUCOSE, CAPILLARY
Glucose-Capillary: 81 mg/dL (ref 65–99)
Glucose-Capillary: 93 mg/dL (ref 65–99)
Glucose-Capillary: 89 mg/dL (ref 65–99)
Glucose-Capillary: 99 mg/dL (ref 65–99)

## 2017-01-24 NOTE — Progress Notes (Signed)
Physical Therapy Session Note  Patient Details  Name: Aimee [name] MRN: [id number] Date of Birth: [birth year]  Today's Date: 01/24/2017 PT Individual Time: 0800-0900; 1300-1330 PT Individual Time Calculation (min): 60 min; 30 min   Short Term Goals: Week 3:  PT Short Term Goal 1 (Week 3): Pt will consistently perform sit>stand with UE support and min A.  PT Short Term Goal 2 (Week 3): Pt will ambulate 144f with LRAD and min guard.  PT Short Term Goal 3 (Week 3): Pt will perform bed mobility with S from without use of bedrails.  PT Short Term Goal 4 (Week 3): Pt will perform stand pivot transfer with LRAD and min guard.   Skilled Therapeutic Interventions/Progress Updates:  Tx 1: Pt received seated in tilt in space w/c. Pt total A to rehab gym. Stand pivot from w/c to mat with RW and min A. Sit>stand from slightly elevated mat table with mod A x8 with focusing on L LE weight-bearing; pt's R UE placed on her L knee to facilitate this. Stand pivots from mat>chair>w/c x 4 with RW and min A; pt demonstrates improved stability of L knee during turning with decreased instances of buckling. Pt ambulated 200' with RW and min A>min guard and few instances of manual facilitation at L knee to prevent buckling and manual facilitation at hips to promote hip extension. Pt with improved ability to consistently demonstrate larger step length with R LE with less cues. Pt also increased gait speed during ambulation with verbal cues to look ahead rather than look at her feet and relax her shoulders. [name] safety [name] updated to stand pivot transfers with RW and mod A; RN notified of change. Pt also transitioned to manual wheelchair rather than tilt in space w/c. Pt ended session seated in manual w/c with all needs met.   Tx 2: Pt received seated in manual w/c. Total A to rehab gym. Min A sit>stand and stand pivot at [employer] in standing x 2 trials 4-5 mins each with manual facilitation at L knee to  prevent buckling as needed. Provided pt with tactile and verbal cues to keep her hips facing forward and for hip extension, as pt tends to rotate her hips to the R to compensate for weight shift on to L LE. Sit>stand from [place] and pivot to sit in w/c with min guard. Total A back to pt's room where she remained seated in wheelchair with all needs met.    Therapy Documentation Precautions:  Precautions Precautions: Fall, Other (comment) Precaution Comments: quadriplegia (LUE/LLE more impaired) PRAFOs Restrictions Weight Bearing Restrictions: Yes RLE Weight Bearing: Weight bearing as tolerated LLE Weight Bearing: Weight bearing as tolerated   See Function Navigator for Current Functional Status.   Therapy/Group: Individual Therapy  CAlysia Penna6/21/2018, 3:36 PM

## 2017-01-24 NOTE — Progress Notes (Signed)
[place] PHYSICAL MEDICINE & REHABILITATION     PROGRESS NOTE    Subjective/Complaints:  ROS: pt denies nausea, vomiting, diarrhea, cough, shortness of breath or chest pain   Objective: Vital Signs: Blood pressure 112/74, pulse 70, temperature 98 F (36.7 C), temperature source Oral, resp. rate 18, weight 85.3 kg (188 lb 0.8 oz), last menstrual period 12/31/2016, SpO2 100 %. No results found.  Recent Labs  01/22/17 0712  WBC 8.6  HGB 12.8  HCT 39.3  PLT 162   No results for input(s): NA, K, CL, GLUCOSE, BUN, CREATININE, CALCIUM in the last 72 hours.  Invalid input(s): CO CBG (last 3)   Recent Labs  01/23/17 1636 01/23/17 2129 01/24/17 0631  GLUCAP 126* 95 81    Wt Readings from Last 3 Encounters:  01/23/17 85.3 kg (188 lb 0.8 oz)  01/07/17 88.9 kg (196 lb)    Physical Exam:  Constitutional: She appears well-developed. NAD. HENT: Normocephalic and atraumatic.  Eyes: EOMI. No discharge.  Cardiovascular: RRR without murmur. No JVD  Respiratory: CTA Bilaterally without wheezes or rales. Normal effort  GI: soft, BS+  Musculoskeletal: She exhibits no edema and mild calf tenderness persists on left Neurological: Alert and oriented  Motor: RUE: 4+/5 proximal to distal LUE: Shoulder abduction 4/5, elbow flex/ext 4/5, hand grip 4/5 RLE: 4/5 right hip flexion, KE, 4/5 ADF/PF LLE:  4/5 proximal to 4/5 distal -intact light touch and pain in all 4's Skin: Skin is warm and dry.  Psychiatric: She has a normal mood and affect. Her behavior is normal.   Assessment/Plan: 1. 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 Physiatrist is providing close team supervision and 24 hour management of active medical problems listed below. Physiatrist and rehab team continue to assess barriers to discharge/monitor patient progress  toward functional and medical goals.  Function:  Bathing Bathing position   Position: Wheelchair/chair at sink  Bathing parts Body parts bathed by patient: Right arm, Left arm, Chest, Abdomen, Front perineal area, Buttocks, Right upper leg, Left upper leg, Right lower leg, Left lower leg Body parts bathed by helper: Left lower leg, Right lower leg  Bathing assist Assist Level: Touching or steadying assistance(Pt > 75%)      Upper Body Dressing/Undressing Upper body dressing   What is the patient wearing?: Pull over shirt/dress, Bra Bra - Perfomed by patient: Thread/unthread right bra strap, Thread/unthread left bra strap Bra - Perfomed by helper: Hook/unhook bra (pull down sports bra) Pull over shirt/dress - Perfomed by patient: Thread/unthread right sleeve, Thread/unthread left sleeve, Put head through opening, Pull shirt over trunk Pull over shirt/dress - Perfomed by helper: Pull shirt over trunk        Upper body assist Assist Level: Touching or steadying assistance(Pt > 75%)   Set up : To obtain clothing/put away  Lower Body Dressing/Undressing Lower body dressing   What is the patient wearing?: Underwear, Pants, Socks, Shoes Underwear - Performed by patient: Thread/unthread right underwear leg, Thread/unthread left underwear leg, Pull underwear up/down Underwear - Performed by helper: Thread/unthread right underwear leg, Thread/unthread left underwear leg Pants- Performed by patient: Thread/unthread right pants leg, Thread/unthread left pants leg Pants- Performed by helper: Pull pants up/down   Non-skid slipper socks- Performed by helper: Don/doff right sock, Don/doff left sock Socks - Performed by patient: Don/doff right sock, Don/doff left sock Socks - Performed by helper: Don/doff right sock Shoes -  Performed by patient: Don/doff right shoe, Don/doff left shoe, Fasten right, Fasten left Shoes - Performed by helper: Don/doff right shoe, Don/doff left shoe, Fasten right,  Fasten left          Lower body assist Assist for lower body dressing: Touching or steadying assistance (Pt > 75%)      Toileting Toileting Toileting activity did not occur: N/A Toileting steps completed by patient: Adjust clothing prior to toileting, Performs perineal hygiene Toileting steps completed by helper: Adjust clothing prior to toileting, Adjust clothing after toileting Toileting Assistive Devices:  (stedy)  Toileting assist Assist level: Two helpers   Transfers Chair/bed transfer   Chair/bed transfer method: Stand pivot Chair/bed transfer assist level: Touching or steadying assistance (Pt > 75%) Chair/bed transfer assistive device: Environmental manager lift: Ecologist Ambulation activity did not occur: Safety/medical concerns (quadriplegia)   Max distance: 85ft Assist level: Touching or steadying assistance (Pt > 75%)   Wheelchair   Type:  (TBD) Max wheelchair distance: 128ft Assist Level: Supervision or verbal cues  Cognition Comprehension Comprehension assist level: Follows complex conversation/direction with extra time/assistive device  Expression Expression assist level: Expresses basic 90% of the time/requires cueing < 10% of the time.  Social Interaction Social Interaction assist level: Interacts appropriately with others - No medications needed.  Problem Solving Problem solving assist level: Solves complex problems: Recognizes & self-corrects  Memory Memory assist level: Complete Independence: No helper   Medical Problem List and Plan: 1.  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  DVT Prophylaxis/Anticoagulation: SCDs.  Left gastroc thrombus---ROM/activity/heat and massage  -f/u doppler unchanged 613 3. Pain Management: Ultram as  needed  -heat and stretching for left calf. Likely related to exercise/thrombophlebitis 4. Mood: Provide emotional support 5. Neuropsych: This patient is capable of making decisions on her own behalf. 6. Skin/Wound Care: Routine skin checks 7. Fluids/Electrolytes/Nutrition: Routine I&Os  -encourage PO 8. Seizure prophylaxis. Keppra 500 mg every 12 hours 9. Hypertension/vasospasm.Nimotop  21 days.  Completed    10. Diabetes mellitus.  Resumed glucophage 500mg  qd with good control 11. Obesity. BMI 34.7 KG. Dietary follow-up 12. Leukoyctosis:   Resolved  WBCs down to 8k   -weaning off steroids   LOS (Days) 17 A FACE TO FACE EVALUATION WAS PERFORMED  Charlett Blake, MD 01/24/2017 9:47 AM

## 2017-01-24 NOTE — Progress Notes (Signed)
Occupational Therapy Note  Patient Details  Name: Aimee [name] MRN: [id number] Date of Birth: 03/22/1976  Today's Date: 01/24/2017 OT Individual Time: 1000-1130 OT Individual Time Calculation (min): 90 min   Pt denies pain Individual Therapy  Pt initially engaged in practicing tub bench transfers X 3 with min A lift LLE out of tub.  Pt performed sit<>stand from tub bench at supervision level.  Pt also engaged in functional amb in ADL apartment and practiced furniture transfers.  Pt required mod A for sit>stand from lower surface (hip flexion>90 degrees). Discussed with patient the importance of selecting sitting surfaces that facilitates easier sit<>stand.  Pt attempted to sit EOB on bed on apartment but was unable to scoot hips far enough on bed to sit safely.  Pt amb with RW to therapy gym and engaged in dynamic standing activities and sit<>stand tasks.  Pt also engaged in w/c mobility activities with focus on BUE strengthening.  Pt propelled w/c back to room and remained in w/c with all needs within reach.    [name] [facility] 01/24/2017, 3:08 PM

## 2017-01-24 NOTE — Progress Notes (Signed)
Occupational Therapy Session Note  Patient Details  Name: Aimee [name] MRN: [id number] Date of Birth: 1976/02/29  Today's Date: 01/24/2017 OT Individual Time: 0700-0800 OT Individual Time Calculation (min): 60 min    Short Term Goals: Week 2:  OT Short Term Goal 1 (Week 2): Pt will perform sit<>stand with mod A from w/c in preparation for pulling up pants OT Short Term Goal 2 (Week 2): Pt will thread BLE into pants with min A OT Short Term Goal 3 (Week 2): Pt will don socks with min A OT Short Term Goal 4 (Week 2): Pt will perform toilet transfers with mod A  Skilled Therapeutic Interventions/Progress Updates:    Pt engaged in BADL retraining including bathing at shower level and dressing with sit<>stand from EOB. Focus on BUE use for all functional tasks, sit<>stand, and standing balance to increase independence with BADLs.  Pt able to pull up pants and underpants without assistance while standing this morning.  Pt continues to demonstrate increased use of LUE in all functional tasks.  Pt able to don socks and shoes with extra time and no assistance.   Therapy Documentation Precautions:  Precautions Precautions: Fall, Other (comment) Precaution Comments: quadriplegia (LUE/LLE more impaired) PRAFOs Restrictions Weight Bearing Restrictions: Yes RLE Weight Bearing: Weight bearing as tolerated LLE Weight Bearing: Weight bearing as tolerated Pain:  Pt denies pain  See Function Navigator for Current Functional Status.   Therapy/Group: Individual Therapy  Leroy Libman 01/24/2017, 3:45 PM

## 2017-01-25 ENCOUNTER — Inpatient Hospital Stay (HOSPITAL_COMMUNITY): Payer: Self-pay | Admitting: Physical Therapy

## 2017-01-25 ENCOUNTER — Inpatient Hospital Stay (HOSPITAL_COMMUNITY): Payer: Self-pay | Admitting: Occupational Therapy

## 2017-01-25 ENCOUNTER — Inpatient Hospital Stay (HOSPITAL_COMMUNITY): Payer: Self-pay

## 2017-01-25 LAB — GLUCOSE, CAPILLARY
Glucose-Capillary: 87 mg/dL (ref 65–99)
Glucose-Capillary: 100 mg/dL — ABNORMAL HIGH (ref 65–99)
Glucose-Capillary: 94 mg/dL (ref 65–99)
Glucose-Capillary: 133 mg/dL — ABNORMAL HIGH (ref 65–99)

## 2017-01-25 NOTE — Progress Notes (Signed)
Physical Therapy Session Note  Patient Details  Name: Aimee [name] Plan MRN: [id number] Date of Birth: [birth year]  Today's Date: 01/25/2017 PT Individual Time: 1100-1200; 1430-1530 PT Individual Time Calculation (min): 60 min; 60 min   Short Term Goals: Week 3:  PT Short Term Goal 1 (Week 3): Pt will consistently perform sit>stand with UE support and min A.  PT Short Term Goal 2 (Week 3): Pt will ambulate 119f with LRAD and min guard.  PT Short Term Goal 3 (Week 3): Pt will perform bed mobility with S from without use of bedrails.  PT Short Term Goal 4 (Week 3): Pt will perform stand pivot transfer with LRAD and min guard.   Skilled Therapeutic Interventions/Progress Updates:  Tx 1: Pt received seated in tilt in space w/c with no reports of pain and eager for therapy. Pt requesting to use the restroom. Pt min A sit>stand from manual w/c and min A for ambulation 132fup slight incline to restroom with RW with manual facilitation at L knee to prevent buckling prn. Pt able to to pull up/down pants with min guard for balance and use of RW. Pt stood at sink to wash her hands but leaned her elbows on the sink, as pt felt unsteady without UE support. Total A to rehab gym in manual w/c. Stand pivot transfer with min A for sit>stand with RW from w/c>mat. Pt performed sit<>stand without AD and mod A with focus on increased use of L LE and eccentric lower to sit without UE assist. Pt performed standing balance with min A reaching and stacking cups with B UEs and no AD x 3 trials; verbal and tactile cues for L knee extension and to prevent buckling and cues for L pelvic rotation and hip extension to increase weight shift onto L LE. Stand pivot transfer with min A and manual facilitation at L knee to prevent buckling as needed. Pt educated on how to fold up RW and pt was able to perform this task. Total A to pt's room where she remained seated in w/c with all needs met.   Tx 2:  Pt received seated in tilt in  space w/c. Pt requesting to use the restroom; min A sit>stand from tilt in space w/c using RW; pt ambulated 1556fo and from restroom and was able to manage pulling pants up/down with min guard for balance and single UE support on RW. Pt ambulated to sink to wash her hand and demonstrated improved ability to maintain balance with less UE support. Total A to hallway outside of rehab gym. Min A sit>stand from w/c with no AD throughout session. Pt ambulated x4 trials between [id number] AD and mod A with student therapist providing support at pt's L side. Manual facilitation at hips for weight shift and hip extension and manual facilitation at L knee to prevent knee buckling and excessive recurvatum as needed. Verbal cues during ambulation for L weight shift and L quad activation to increase stability while stepping with R LE. Knee cage applied to L LE during final ambulation trial to prevent recurvatum during ambulation; pt felt more secure with knee cage. Stand pivot transfer with min A and support at pt's L side from chair in hallway to w/c without RW. Total A return to pt's room with all needs met.   Therapy Documentation Precautions:  Precautions Precautions: Fall, Other (comment) Precaution Comments: quadriplegia (LUE/LLE more impaired) PRAFOs Restrictions Weight Bearing Restrictions: No RLE Weight Bearing: Weight bearing as tolerated LLE  Weight Bearing: Weight bearing as tolerated   See Function Navigator for Current Functional Status.   Therapy/Group: Individual Therapy  Alysia Penna 01/25/2017, 4:07 PM

## 2017-01-25 NOTE — Progress Notes (Signed)
Occupational Therapy Session Note  Patient Details  Name: Aimee [name] MRN: [id number] Date of Birth: May 18, 1976  Today's Date: 01/25/2017 OT Individual Time: 9407-6808 OT Individual Time Calculation (min): 89 min    Short Term Goals: Week 2:  OT Short Term Goal 1 (Week 2): Pt will perform sit<>stand with mod A from w/c in preparation for pulling up pants OT Short Term Goal 2 (Week 2): Pt will thread BLE into pants with min A OT Short Term Goal 3 (Week 2): Pt will don socks with min A OT Short Term Goal 4 (Week 2): Pt will perform toilet transfers with mod A  Skilled Therapeutic Interventions/Progress Updates:    OT treatment session focused on modified bathing/dressing, transfer training, improved sit<>stand,standing balance/endurance, and L fine motor coordination. Pt collected clothing wc level with min A to position wc and access lower drawers safely. Pt completed stand-step turn transfer wc > tub bench using grab bars with min guard A. [employer] B UE coordination with hair washing and bathing tasks. Pt transferred out of shower in similar fashion as above. LB dressing completed with min A to position LLE to achieve figure 4 position.  Sit<>stand with min guard A and VC for alternating UE support while reaching behind  To pull up pants. Improved L UE ER to pull up pants from behind. Utilized friction reducing device to don TED hose with mod A. [employer] forced use of L UE within ADL tasks. Pt propelled wc 1/2 way to therapy gym. Stand-step turn to face therapy mat and min A to bring L UE up to mat to achieve quadruped position. Pt then cam to tall kneeling using bench. Hip strengthening/weight bearing with tall kneeling and L fine motor coordination using painting task. Graded task with larger handled brush, then pt able to stay in the lines of picture 75%. Pt returned to room and left with needs met.   Therapy Documentation Precautions:  Precautions Precautions: Fall, Other  (comment) Precaution Comments: quadriplegia (LUE/LLE more impaired) PRAFOs Restrictions Weight Bearing Restrictions: No RLE Weight Bearing: Weight bearing as tolerated LLE Weight Bearing: Weight bearing as tolerated Pain: Pain Assessment Pain Assessment: 0-10 Pain Score: 2  Pain Type: Acute pain Pain Location: Ankle Pain Orientation: Right;Left Pain Descriptors / Indicators: Sore Pain Onset: On-going Pain Intervention(s): Repositioned (compression  TEDs) ADL: ADL ADL Comments: refer functional navigator  See Function Navigator for Current Functional Status.   Therapy/Group: Individual Therapy  Valma Cava 01/25/2017, 12:31 PM

## 2017-01-25 NOTE — Progress Notes (Signed)
[facility]     PROGRESS NOTE    Subjective/Complaints: No issues overnight, has some cramping in the calves and some swelling in the ankles. No trauma to the area.  ROS: pt denies nausea, vomiting, diarrhea, cough, shortness of breath or chest pain   Objective: Vital Signs: Blood pressure 99/61, pulse 80, temperature 99 F (37.2 C), temperature source Oral, resp. rate 20, weight 85.3 kg (188 lb 0.8 oz), last menstrual period 12/31/2016, SpO2 99 %. No results found.  Recent Labs  01/22/17 0712  WBC 8.6  HGB 12.8  HCT 39.3  PLT 162   No results for input(s): NA, K, CL, GLUCOSE, BUN, CREATININE, CALCIUM in the last 72 hours.  Invalid input(s): CO CBG (last 3)   Recent Labs  01/24/17 1135 01/24/17 1639 01/24/17 2132  GLUCAP 93 89 99    Wt Readings from Last 3 Encounters:  01/23/17 85.3 kg (188 lb 0.8 oz)  01/07/17 88.9 kg (196 lb)    Physical Exam:  Constitutional: She appears well-developed. NAD. HENT: Normocephalic and atraumatic.  Eyes: EOMI. No discharge.  Cardiovascular: RRR without murmur. No JVD  Respiratory: CTA Bilaterally without wheezes or rales. Normal effort  GI: soft, BS+  Musculoskeletal: She exhibits no edema and mild calf tenderness persists on left, Calves are soft bilaterally. Negative Homans, negative Thompson's test  Neurological: Alert and oriented  Motor: RUE: 4+/5 proximal to distal LUE: Shoulder abduction 4/5, elbow flex/ext 4/5, hand grip 4/5 RLE: 4/5 right hip flexion, KE, 4/5 ADF/PF LLE:  4/5 proximal to 4/5 distal -intact light touch and pain in all 4's Skin: Skin is warm and dry. 1 plus ankle edema bilaterally  Psychiatric: She has a normal mood and affect. Her behavior is normal.   Assessment/Plan: 1. 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 Physiatrist is providing close team supervision and 24 hour management of active medical problems listed below. Physiatrist and rehab team continue to assess barriers to discharge/monitor patient progress toward functional and medical goals.  Function:  Bathing Bathing position   Position: Shower  Bathing parts Body parts bathed by patient: Right arm, Left arm, Chest, Abdomen, Front perineal area, Right upper leg, Left upper leg, Right lower leg, Left lower leg Body parts bathed by helper: Buttocks  Bathing assist Assist Level: Touching or steadying assistance(Pt > 75%)      Upper Body Dressing/Undressing Upper body dressing   What is the patient wearing?: Pull over shirt/dress, Bra Bra - Perfomed by patient: Thread/unthread right bra strap, Thread/unthread left bra strap Bra - Perfomed by helper: Hook/unhook bra (pull down sports bra) Pull over shirt/dress - Perfomed by patient: Thread/unthread right sleeve, Thread/unthread left sleeve, Put head through opening, Pull shirt over trunk Pull over shirt/dress - Perfomed by helper: Pull shirt over trunk        Upper body assist Assist Level: Touching or steadying assistance(Pt > 75%)   Set up : To obtain clothing/put away  Lower Body Dressing/Undressing Lower body dressing   What is the patient wearing?: Underwear, Pants, Socks, Shoes Underwear - Performed by patient: Thread/unthread right underwear leg, Thread/unthread left underwear leg, Pull underwear up/down Underwear - Performed by helper: Thread/unthread right underwear leg, Thread/unthread left underwear leg Pants- Performed by patient: Thread/unthread right pants leg, Thread/unthread left pants leg, Pull pants up/down Pants- Performed by helper: Pull pants up/down   Non-skid slipper socks-  Performed by helper: Don/doff right sock, Don/doff left sock Socks - Performed by patient: Don/doff right sock, Don/doff left sock Socks - Performed by helper: Don/doff right  sock Shoes - Performed by patient: Don/doff right shoe, Don/doff left shoe, Fasten right, Fasten left Shoes - Performed by helper: Don/doff right shoe, Don/doff left shoe, Fasten right, Fasten left          Lower body assist Assist for lower body dressing: Touching or steadying assistance (Pt > 75%)      Toileting Toileting Toileting activity did not occur: N/A Toileting steps completed by patient: Adjust clothing prior to toileting, Performs perineal hygiene, Adjust clothing after toileting Toileting steps completed by helper: Adjust clothing prior to toileting, Adjust clothing after toileting Toileting Assistive Devices: Grab bar or rail  Toileting assist Assist level: Touching or steadying assistance (Pt.75%)   Transfers Chair/bed transfer   Chair/bed transfer method: Stand pivot Chair/bed transfer assist level: Touching or steadying assistance (Pt > 75%) Chair/bed transfer assistive device: Armrests Mechanical lift: Stedy   Locomotion Ambulation Ambulation activity did not occur: Safety/medical concerns (quadriplegia)   Max distance: 250ft Assist level: Touching or steadying assistance (Pt > 75%)   Wheelchair   Type:  (TBD) Max wheelchair distance: 153ft Assist Level: Supervision or verbal cues  Cognition Comprehension Comprehension assist level: Follows complex conversation/direction with extra time/assistive device  Expression Expression assist level: Expresses basic 90% of the time/requires cueing < 10% of the time.  Social Interaction Social Interaction assist level: Interacts appropriately with others - No medications needed.  Problem Solving Problem solving assist level: Solves complex problems: Recognizes & self-corrects  Memory Memory assist level: Complete Independence: No helper   Medical Problem List and Plan: 1.  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  DVT Prophylaxis/Anticoagulation: SCDs.  Left gastroc thrombus---ROM/activity/heat and massage  -f/u doppler unchanged 6/13 3. Pain Management: Ultram as needed  -Bilateral calf strain, BenGay cream 4. Mood: Provide emotional support 5. Neuropsych: This patient is capable of making decisions on her own behalf. 6. Skin/Wound Care: Routine skin checks 7. Fluids/Electrolytes/Nutrition: Routine I&Os  -encourage PO 8. Seizure prophylaxis. Keppra 500 mg every 12 hours, no seizure activity     9. Diabetes mellitus.  Resumed glucophage 500mg  qd with good control 6/23 CBG (last 3)   Recent Labs  01/24/17 1135 01/24/17 1639 01/24/17 2132  GLUCAP 93 89 99    11. Obesity. BMI 34.7 KG. Dietary follow-up    LOS (Days) 18 A FACE TO FACE EVALUATION WAS PERFORMED  Charlett Blake, MD 01/25/2017 6:01 AM

## 2017-01-25 NOTE — Progress Notes (Signed)
Occupational Therapy Session Note  Patient Details  Name: Aimee [name] MRN: [id number] Date of Birth: [birth year]  Today's Date: 01/25/2017 OT Individual Time: 1330-1400 OT Individual Time Calculation (min): 30 min    Short Term Goals: Week 2:  OT Short Term Goal 1 (Week 2): Pt will perform sit<>stand with mod A from w/c in preparation for pulling up pants OT Short Term Goal 2 (Week 2): Pt will thread BLE into pants with min A OT Short Term Goal 3 (Week 2): Pt will don socks with min A OT Short Term Goal 4 (Week 2): Pt will perform toilet transfers with mod A  Skilled Therapeutic Interventions/Progress Updates:    1:1. Pt no c/o pain. Pt ambulates with RW throughout session in room with MIN A for steadying assist. Pt with no LOB this date. Pt completes simulated toileting completing clothing management and transfer onto commode with MIN A for balance and VC for use of LUE. Pt stands at [employer] to fold clothes without UE support with touching A and VC to shift weight onto LLE in standing. Pt stands at sink with MIN A to complete oral care. Exited session with pt seated in w/c with call light in reach and all needs met.   Therapy Documentation Precautions:  Precautions Precautions: Fall, Other (comment) Precaution Comments: quadriplegia (LUE/LLE more impaired) PRAFOs Restrictions Weight Bearing Restrictions: No RLE Weight Bearing: Weight bearing as tolerated LLE Weight Bearing: Weight bearing as tolerated ADL: ADL ADL Comments: refer functional navigator  See Function Navigator for Current Functional Status.   Therapy/Group: Individual Therapy  Tonny Branch 01/25/2017, 3:03 PM

## 2017-01-26 ENCOUNTER — Inpatient Hospital Stay (HOSPITAL_COMMUNITY): Payer: Self-pay

## 2017-01-26 LAB — GLUCOSE, CAPILLARY
Glucose-Capillary: 80 mg/dL (ref 65–99)
Glucose-Capillary: 88 mg/dL (ref 65–99)
Glucose-Capillary: 99 mg/dL (ref 65–99)
Glucose-Capillary: 111 mg/dL — ABNORMAL HIGH (ref 65–99)

## 2017-01-26 MED ORDER — MUSCLE RUB 10-15 % EX CREA
TOPICAL_CREAM | CUTANEOUS | Status: DC | PRN
  Administered 2017-01-26: 14:00:00 via TOPICAL
  Filled 2017-01-26: qty 85

## 2017-01-26 NOTE — Progress Notes (Signed)
Occupational Therapy Session Note  Patient Details  Name: Aimee [name] MRN: [id number] Date of Birth: 03/13/1976  Today's Date: 01/26/2017 OT Individual Time: 1430-1525 OT Individual Time Calculation (min): 55 min    Skilled Therapeutic Interventions/Progress Updates:    1:1. Pt with no c/o pain and requesting to use restroom upon entering room. Pt ambulates throughout session with RW and MIN A fading to supervision. Pt voids urine seated on toilet and is able to complete 3/3 components of toileting with MIN A for standing balance. With increased time, pt doffs non slip slipper socks and dons B shoes using a stool to elevate feet. Pt stands for ~30 min with 1 seated rest break and uses LUE to paint on an easel at shoulder height to improve strength and FMC of LUE. After seated rest break OT adds blue foam pad under feet to further challenge balance. Pt continues to stand with supervision with 1 LOB to R, however pt able to recover without A. Exited session with pt seated in w/c in room with call light in reach and family present.   Therapy Documentation Precautions:  Precautions Precautions: Fall, Other (comment) Precaution Comments: quadriplegia (LUE/LLE more impaired) PRAFOs Restrictions Weight Bearing Restrictions: No RLE Weight Bearing: Weight bearing as tolerated LLE Weight Bearing: Weight bearing as tolerated    ADL: ADL ADL Comments: refer functional navigator  See Function Navigator for Current Functional Status.   Therapy/Group: Individual Therapy  Tonny Branch 01/26/2017, 3:32 PM

## 2017-01-27 ENCOUNTER — Inpatient Hospital Stay (HOSPITAL_COMMUNITY): Payer: Self-pay

## 2017-01-27 DIAGNOSIS — G40909 Epilepsy, unspecified, not intractable, without status epilepticus: Secondary | ICD-10-CM

## 2017-01-27 LAB — GLUCOSE, CAPILLARY
Glucose-Capillary: 82 mg/dL (ref 65–99)
Glucose-Capillary: 86 mg/dL (ref 65–99)
Glucose-Capillary: 117 mg/dL — ABNORMAL HIGH (ref 65–99)
Glucose-Capillary: 128 mg/dL — ABNORMAL HIGH (ref 65–99)

## 2017-01-27 NOTE — Progress Notes (Signed)
Occupational Therapy Session Note  Patient Details  Name: Aimee [name] MRN: [id number] Date of Birth: 24-Jan-1976  Today's Date: 01/27/2017 OT Individual Time: 7943-2761 OT Individual Time Calculation (min): 45 min    Short Term Goals: Week 3:     Skilled Therapeutic Interventions/Progress Updates:    1:1. Pt supine>sitting EOB<>ambulatory transfer to TTB with supervision and VC for RW management and longer strides. Pt gathers clothes for donning after shower and bathes seated on TTB with distant supervision washing 10/10 body parts utilizing long handled shower sponge. Pt dons clothing seated EOB using seated figure four and a stool to elevate foot to don footwear. Pt completes sit to stand with MIN A without AD when advancing pants past hips with no LOB noted but increased time to power up through legs.  Exited session with pt seated in w/c with call light in reach and breakfast tray set up.   Therapy Documentation Precautions:  Precautions Precautions: Fall, Other (comment) Precaution Comments: quadriplegia (LUE/LLE more impaired) PRAFOs Restrictions Weight Bearing Restrictions: No RLE Weight Bearing: Weight bearing as tolerated LLE Weight Bearing: Weight bearing as tolerated  See Function Navigator for Current Functional Status.   Therapy/Group: Individual Therapy  Tonny Branch 01/27/2017, 7:13 AM

## 2017-01-27 NOTE — Progress Notes (Signed)
[facility]     PROGRESS NOTE    Subjective/Complaints: No issues overnight. Leg pain is better with BenGay cream  ROS: pt denies nausea, vomiting, diarrhea, cough, shortness of breath or chest pain   Objective: Vital Signs: Blood pressure 128/85, pulse 87, temperature 98.5 F (36.9 C), temperature source Oral, resp. rate 18, weight 85.3 kg (188 lb 0.8 oz), last menstrual period 12/31/2016, SpO2 99 %. No results found. No results for input(s): WBC, HGB, HCT, PLT in the last 72 hours. No results for input(s): NA, K, CL, GLUCOSE, BUN, CREATININE, CALCIUM in the last 72 hours.  Invalid input(s): CO CBG (last 3)   Recent Labs  01/26/17 1653 01/26/17 2032 01/27/17 0639  GLUCAP 99 111* 82    Wt Readings from Last 3 Encounters:  01/23/17 85.3 kg (188 lb 0.8 oz)  01/07/17 88.9 kg (196 lb)    Physical Exam:  Constitutional: She appears well-developed. NAD. HENT: Normocephalic and atraumatic.  Eyes: EOMI. No discharge.  Cardiovascular: RRR without murmur. No JVD  Respiratory: CTA Bilaterally without wheezes or rales. Normal effort  GI: soft, BS+  Musculoskeletal: She exhibits no edema and mild calf tenderness persists on left, Calves are soft bilaterally. Negative Homans, negative Thompson's test  Neurological: Alert and oriented  Motor: RUE: 4+/5 proximal to distal LUE: Shoulder abduction 4/5, elbow flex/ext 4/5, hand grip 4/5 RLE: 4/5 right hip flexion, KE, 4/5 ADF/PF LLE:  4/5 proximal to 4/5 distal -intact light touch and pain in all 4's Skin: Skin is warm and dry. 1 plus ankle edema bilaterally  Psychiatric: She has a normal mood and affect. Her behavior is normal.   Assessment/Plan: 1. 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 Physiatrist is providing close team supervision and 24  hour management of active medical problems listed below. Physiatrist and rehab team continue to assess barriers to discharge/monitor patient progress toward functional and medical goals.  Function:  Bathing Bathing position   Position: Shower  Bathing parts Body parts bathed by patient: Right arm, Left arm, Chest, Abdomen, Front perineal area, Buttocks, Right upper leg, Left upper leg, Right lower leg, Left lower leg Body parts bathed by helper: Buttocks  Bathing assist Assist Level: Touching or steadying assistance(Pt > 75%) (needed steadying assistance forstanding in shower for perineal cleaning)      Upper Body Dressing/Undressing Upper body dressing   What is the patient wearing?: Bra, Pull over shirt/dress Bra - Perfomed by patient: Thread/unthread right bra strap, Thread/unthread left bra strap Bra - Perfomed by helper: Hook/unhook bra (pull down sports bra) Pull over shirt/dress - Perfomed by patient: Thread/unthread right sleeve, Thread/unthread left sleeve, Put head through opening, Pull shirt over trunk Pull over shirt/dress - Perfomed by helper: Pull shirt over trunk        Upper body assist Assist Level: Touching or steadying assistance(Pt > 75%)   Set up : To obtain clothing/put away  Lower Body Dressing/Undressing Lower body dressing   What is the patient wearing?: Underwear, Pants, Shoes, [employer]  - Performed by patient: Thread/unthread right underwear leg, Pull underwear up/down Underwear - Performed by helper: Thread/unthread left underwear leg Pants- Performed by patient: Thread/unthread right pants leg, Pull pants up/down Pants- Performed by helper: Thread/unthread left pants leg   Non-skid slipper socks- Performed by helper: Don/doff right sock, Don/doff left sock Socks - Performed by patient: Don/doff right sock,  Don/doff left sock Socks - Performed by helper: Don/doff right sock Shoes - Performed by patient: Don/doff right shoe, Fasten right,  Fasten left (with short stool) Shoes - Performed by helper: Don/doff left shoe       TED Hose - Performed by helper: Don/doff right TED hose, Don/doff left TED hose  Lower body assist Assist for lower body dressing: Touching or steadying assistance (Pt > 75%)      Toileting Toileting Toileting activity did not occur: N/A Toileting steps completed by patient: Adjust clothing prior to toileting, Performs perineal hygiene, Adjust clothing after toileting Toileting steps completed by helper: Adjust clothing prior to toileting, Performs perineal hygiene, Adjust clothing after toileting Toileting Assistive Devices: Grab bar or rail  Toileting assist Assist level: Touching or steadying assistance (Pt.75%)   Transfers Chair/bed transfer   Chair/bed transfer method: Stand pivot Chair/bed transfer assist level: Touching or steadying assistance (Pt > 75%) Chair/bed transfer assistive device: Armrests Mechanical lift: Stedy   Locomotion Ambulation Ambulation activity did not occur: Safety/medical concerns (quadriplegia)   Max distance: 29ft Assist level: Moderate assist (Pt 50 - 74%)   Wheelchair   Type:  (TBD) Max wheelchair distance: 174ft Assist Level: Supervision or verbal cues  Cognition Comprehension Comprehension assist level: Follows complex conversation/direction with extra time/assistive device  Expression Expression assist level: Expresses basic 90% of the time/requires cueing < 10% of the time.  Social Interaction Social Interaction assist level: Interacts appropriately with others - No medications needed.  Problem Solving Problem solving assist level: Solves complex problems: Recognizes & self-corrects  Memory Memory assist level: Complete Independence: No helper   Medical Problem List and Plan: 1.  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  DVT Prophylaxis/Anticoagulation: SCDs.  Left gastroc thrombus---ROM/activity/heat and massage  -f/u doppler unchanged 6/13 3. Pain Management: Ultram as needed  -Bilateral calf strain, improved with BenGay cream 4. Mood: Provide emotional support 5. Neuropsych: This patient is capable of making decisions on her own behalf. 6. Skin/Wound Care: Routine skin checks 7. Fluids/Electrolytes/Nutrition: Routine I&Os  -encourage PO 8. Seizure prophylaxis. Keppra 500 mg every 12 hours, no seizure activity 6/24     9. Diabetes mellitus.  Resumed glucophage 500mg  qd with good control 6/24 CBG (last 3)   Recent Labs  01/26/17 1653 01/26/17 2032 01/27/17 0639  GLUCAP 99 111* 82    11. Obesity. BMI 34.7 KG. Dietary follow-up    LOS (Days) 20 A FACE TO FACE EVALUATION WAS PERFORMED  Charlett Blake, MD 01/27/2017 10:50 AM

## 2017-01-28 ENCOUNTER — Inpatient Hospital Stay (HOSPITAL_COMMUNITY): Payer: Self-pay | Admitting: Occupational Therapy

## 2017-01-28 ENCOUNTER — Inpatient Hospital Stay (HOSPITAL_COMMUNITY): Payer: Self-pay

## 2017-01-28 ENCOUNTER — Inpatient Hospital Stay (HOSPITAL_COMMUNITY): Payer: Self-pay | Admitting: Physical Therapy

## 2017-01-28 LAB — GLUCOSE, CAPILLARY
Glucose-Capillary: 86 mg/dL (ref 65–99)
Glucose-Capillary: 97 mg/dL (ref 65–99)
Glucose-Capillary: 155 mg/dL — ABNORMAL HIGH (ref 65–99)
Glucose-Capillary: 114 mg/dL — ABNORMAL HIGH (ref 65–99)

## 2017-01-28 NOTE — Progress Notes (Signed)
Occupational Therapy Session Note  Patient Details  Name: Aimee [name] MRN: [id number] Date of Birth: 11-15-1975  Today's Date: 01/28/2017 OT Individual Time: 0900-1000 OT Individual Time Calculation (min): 60 min    Short Term Goals: Week 2:  OT Short Term Goal 1 (Week 2): Pt will perform sit<>stand with mod A from w/c in preparation for pulling up pants OT Short Term Goal 2 (Week 2): Pt will thread BLE into pants with min A OT Short Term Goal 3 (Week 2): Pt will don socks with min A OT Short Term Goal 4 (Week 2): Pt will perform toilet transfers with mod A Week 3:     Skilled Therapeutic Interventions/Progress Updates:    Pt engaged in BADL retaining including bathing at shower level and dressing with sit<>stand from EOB.  Pt amb with RW in room to gather clothing prior to entering bathroom to use toilet and take a shower.  Pt completed all tasks at supervision level except for assistance with straightening bra after donning.  Pt stood in shower X 3 to complete LB bathing tasks without UE support.  Pt returned to w/c and remained in w/c with all needs within reach. Focus on BUE use, functional amb with RW, sit<>stand, standing balance, BADL retraining, and safety awareness to increase independence with BADLs.  Therapy Documentation Precautions:  Precautions Precautions: Fall, Other (comment) Precaution Comments: quadriplegia (LUE/LLE more impaired) PRAFOs Restrictions Weight Bearing Restrictions: No RLE Weight Bearing: Weight bearing as tolerated LLE Weight Bearing: Weight bearing as tolerated Pain: Pain Assessment Pain Assessment: 0-10 Pain Score: 6  Pain Type: Acute pain Pain Location: Leg Pain Orientation: Right Pain Descriptors / Indicators: Aching Pain Onset: On-going Pain Intervention(s): RN aware   See Function Navigator for Current Functional Status.   Therapy/Group: Individual Therapy  Leroy Libman 01/28/2017, 12:11 PM

## 2017-01-28 NOTE — Progress Notes (Signed)
Physical Therapy Session Note  Patient Details  Name: Aimee [name] MRN: [id number] Date of Birth: 02-22-1976  Today's Date: 01/28/2017 PT Individual Time: 1105-1200 PT Individual Time Calculation (min): 55 min   Short Term Goals: Week 3:  PT Short Term Goal 1 (Week 3): Pt will consistently perform sit>stand with UE support and min A.  PT Short Term Goal 2 (Week 3): Pt will ambulate 186f with LRAD and min guard.  PT Short Term Goal 3 (Week 3): Pt will perform bed mobility with S from without use of bedrails.  PT Short Term Goal 4 (Week 3): Pt will perform stand pivot transfer with LRAD and min guard.   Skilled Therapeutic Interventions/Progress Updates:  Pt received seated in w/c reporting 5/10 pain in L posterior knee. Pt total A to rehab gym. Stand pivot transfer with min guard from w/c>mat. Standing forward/backward steps with R LE and min A with no AD and manual facilitation at L knee to prevent buckling prn with verbal cues for L weight shift and glute activation. Lateral steps with R LE and min A with no AD with tactile cues for glute activation; less instances of knee buckling with lateral stepping. Standing balance with feet together and S with no lob x 1 min. Pt reporting increased pain in L LE. S for sit>supine on mat. L sidelying hip abduction 2x10 with 3 second hold; verbal and tactile cues to maintain complete R sidelying position during exercise. Prone hip extension L LE 2x10 with 3 second hold. S for prone>sit on edge of mat. Min A backward walking x 159fto sit in w/c with manual facilitation at L knee to prevent buckling prn. Pt total A to return to room where she remained seated in w/c with all needs met.   Therapy Documentation Precautions:  Precautions Precautions: Fall, Other (comment) Precaution Comments: quadriplegia (LUE/LLE more impaired) PRAFOs Restrictions Weight Bearing Restrictions: No RLE Weight Bearing: Weight bearing as tolerated LLE Weight Bearing:  Weight bearing as tolerated   See Function Navigator for Current Functional Status.   Therapy/Group: Individual Therapy  CaAlysia Penna/25/2018, 3:43 PM

## 2017-01-28 NOTE — Progress Notes (Signed)
Occupational Therapy Session Note  Patient Details  Name: Aimee [name] Plan MRN: [id number] Date of Birth: 09/08/1975  Today's Date: 01/28/2017 OT Individual Time: 1345-1430 OT Individual Time Calculation (min): 45 min    Short Term Goals: Week 2:  OT Short Term Goal 1 (Week 2): Pt will perform sit<>stand with mod A from w/c in preparation for pulling up pants OT Short Term Goal 2 (Week 2): Pt will thread BLE into pants with min A OT Short Term Goal 3 (Week 2): Pt will don socks with min A OT Short Term Goal 4 (Week 2): Pt will perform toilet transfers with mod A  Skilled Therapeutic Interventions/Progress Updates:    Pt seen for OT session focusing on functional standing balance/ endurance, functional use of UEs, and IADL re-training. Pt received with hand off from PT, agreeable to tx session. Noted pain in L LE, RN already aware and pt desiring to cont with therapy. She completed towel folding activity from standing position x2 trials, able to use B UEs simultaneously with no observed differences in performance R/L. On second trial, pt required to obtain towels from floor then stand to fold. Completed with guarding assist to squat and obtain items.  She completed Connect-4 game with emphasis on in-hand manipulation of game pieces, completing transition/translation movements with L hand mod I.  Following seated rest break, she ambulated to ADL apartment and completed bed making activity with supervision, one seated rest break required. She demonstrated good RW management throughout activity. Pt returned to room at end of session, left seated in w/c with all needs in reach.   Therapy Documentation Precautions:  Precautions Precautions: Fall, Other (comment) Precaution Comments: quadriplegia (LUE/LLE more impaired) PRAFOs Restrictions Weight Bearing Restrictions: No RLE Weight Bearing: Weight bearing as tolerated LLE Weight Bearing: Weight bearing as tolerated ADL: ADL ADL  Comments: refer functional navigator  See Function Navigator for Current Functional Status.   Therapy/Group: Individual Therapy  Lewis, Jayla Mackie C 01/28/2017, 7:21 AM

## 2017-01-28 NOTE — Progress Notes (Signed)
Physical Therapy Session Note  Patient Details  Name: Aimee [name] MRN: [id number] Date of Birth: 07-13-1976  Today's Date: 01/28/2017 PT Individual Time: 1300-1345 PT Individual Time Calculation (min): 45 min   Short Term Goals: Week 3:  PT Short Term Goal 1 (Week 3): Pt will consistently perform sit>stand with UE support and min A.  PT Short Term Goal 2 (Week 3): Pt will ambulate 185ft with LRAD and min guard.  PT Short Term Goal 3 (Week 3): Pt will perform bed mobility with S from without use of bedrails.  PT Short Term Goal 4 (Week 3): Pt will perform stand pivot transfer with LRAD and min guard.   Skilled Therapeutic Interventions/Progress Updates:    Functional transfers with RW with overall supervision to steadying assist during session demonstrating safe technique. Neuro re-ed to address LLE knee control and strengthening with focus on sit <> stands without UE support and then with 4" step under RLE while performing sit <> stands and static standing demonstrating good control at knee (no recurvatum noted), and step ups on 3" step (with BUE support) x 10 reps leading with LLE. Dynamic gait through obstacle course for home environment mobility training with close supervision to steadying assist with RW navigating turns and stepping over thresholds. D/c planning discussed throughout session and pt goals/progress made. Handoff to OT.  Therapy Documentation Precautions:  Precautions Precautions: Fall, Other (comment) Precaution Comments: quadriplegia (LUE/LLE more impaired) PRAFOs Restrictions Weight Bearing Restrictions: No RLE Weight Bearing: Weight bearing as tolerated LLE Weight Bearing: Weight bearing as tolerated Pain: Pain Assessment Pain Assessment: 0-10 Pain Score: 6  Pain Type: Acute pain Pain Location: Leg Pain Intervention(s): Medication (See eMAR)   See Function Navigator for Current Functional Status.   Therapy/Group: Individual Therapy  Canary Brim  Ivory Broad, PT, DPT  01/28/2017, 1:52 PM

## 2017-01-28 NOTE — Progress Notes (Signed)
Occupational Therapy Weekly Progress Note  Patient Details  Name: Aimee [name] MRN: [id number] Date of Birth: [birth year]  Beginning of progress report period: January 18, 2017 End of progress report period: January 28, 2017  Patient has met 4 of 4 short term goals.  Pt made steady progress with BADLs during the past week.  Pt requires assistance with pulling down her sports bra but is able to complete the remainder of dressing tasks at supervision level.  Pt requires steady A for functional transfers.  Pt continues to demonstrate increased functional use of BUE and is now able to brush her hair without assistance.  Pt completes toileting tasks without assistance.   Patient continues to demonstrate the following deficits:abnormal posture, apraxia, cognitive deficits, disturbance of vision and quadriparesis with RUE>LUE  and therefore will continue to benefit from skilled OT intervention to enhance overall performance with BADL, iADL and Reduce care partner burden.  Patient progressing toward long term goals..  [name] of care revisions: Goals upgraded to supervision overall. See POC for goal details. .  OT Short Term Goals Week 2:  OT Short Term Goal 1 (Week 2): Pt will perform sit<>stand with mod A from w/c in preparation for pulling up pants OT Short Term Goal 1 - Progress (Week 2): Met OT Short Term Goal 2 (Week 2): Pt will thread BLE into pants with min A OT Short Term Goal 2 - Progress (Week 2): Met OT Short Term Goal 3 (Week 2): Pt will don socks with min A OT Short Term Goal 3 - Progress (Week 2): Met OT Short Term Goal 4 (Week 2): Pt will perform toilet transfers with mod A OT Short Term Goal 4 - Progress (Week 2): Met Week 3:  OT Short Term Goal 1 (Week 3): Pt will perform UB dressing tasks with supervision OT Short Term Goal 2 (Week 3): Pt will complete LB dressing tasks with supervision OT Short Term Goal 3 (Week 3): Pt will complete shower transfer with supervisoin OT Short Term  Goal 4 (Week 3): Pt will complete toilet transfer with supervisoin   Therapy Documentation Precautions:  Precautions Precautions: Fall, Other (comment) Precaution Comments: quadriplegia (LUE/LLE more impaired) PRAFOs Restrictions Weight Bearing Restrictions: No RLE Weight Bearing: Weight bearing as tolerated LLE Weight Bearing: Weight bearing as tolerated    See Function Navigator for Current Functional Status.    [name] [facility] 01/28/2017, 3:25 PM

## 2017-01-29 ENCOUNTER — Encounter (HOSPITAL_COMMUNITY): Payer: Self-pay | Admitting: Psychology

## 2017-01-29 ENCOUNTER — Inpatient Hospital Stay (HOSPITAL_COMMUNITY): Payer: Self-pay

## 2017-01-29 ENCOUNTER — Inpatient Hospital Stay (HOSPITAL_COMMUNITY): Payer: Self-pay | Admitting: Physical Therapy

## 2017-01-29 ENCOUNTER — Inpatient Hospital Stay (HOSPITAL_COMMUNITY): Payer: Self-pay | Admitting: Occupational Therapy

## 2017-01-29 LAB — GLUCOSE, CAPILLARY
Glucose-Capillary: 109 mg/dL — ABNORMAL HIGH (ref 65–99)
Glucose-Capillary: 113 mg/dL — ABNORMAL HIGH (ref 65–99)
Glucose-Capillary: 103 mg/dL — ABNORMAL HIGH (ref 65–99)
Glucose-Capillary: 98 mg/dL (ref 65–99)

## 2017-01-29 NOTE — Progress Notes (Signed)
Physical Therapy Weekly Progress Note  Patient Details  Name: Aimee [name] Plan MRN: [id number] Date of Birth: 1975-12-20  Beginning of progress report period: January 22, 2017 End of progress report period: January 29, 2017  Today's Date: 01/29/2017 PT Individual Time: 1130-1200 PT Individual Time Calculation (min): 30 min   Patient has met 4 of 4 short term goals.  Pt continues to demonstrate progress with transfers, bed mobility, and ambulation. Pt currently min guard/S with stand pivot transfers and ambulation with RW and S for bed mobility. She requires mod A for ambulation without AD. Pt min A with stairs with manual facilitation at L knee to prevent buckling prn. Pt demonstrating improved strength in B LEs and improved standing balance. Pt very motivated and hard-working during sessions. Pt needs continued practice with sit>stand, transfers, and ambulation to prepare for discharge.   Patient continues to demonstrate the following deficits muscle weakness, decreased cardiorespiratoy endurance, impaired timing and sequencing, unbalanced muscle activation, motor apraxia and decreased motor planning and decreased standing balance and therefore will continue to benefit from skilled PT intervention to increase functional independence with mobility.  Patient progressing toward long term goals..  Continue plan of care.  PT Short Term Goals Week 3:  PT Short Term Goal 1 (Week 3): Pt will consistently perform sit>stand with UE support and min A.  PT Short Term Goal 2 (Week 3): Pt will ambulate 173f with LRAD and min guard.  PT Short Term Goal 3 (Week 3): Pt will perform bed mobility with S from without use of bedrails.  PT Short Term Goal 4 (Week 3): Pt will perform stand pivot transfer with LRAD and min guard.  Week 4:  PT Short Term Goal 1 (Week 4): = LTGs due to ELOS.  Skilled Therapeutic Interventions/Progress Updates:  Pt received seated in manual w/c. Total A to rehab gym. Min A sit>stand  from w/c without AD. Lateral step ups with L LE on 3" step 1x20 with min A for manual facilitation at L LE prn. Faded from B UE support to single UE support; verbal and tactile cues for L weight shift and glute activation. Pt performed curb step with min A using RW to simulate step into pt's home. Pt ambulated 1595fwith close S using RW to return to pt's room; pt demonstrated improved step length during ambulation today. Pt left seated in w/c with LEs elevated, ice bag applied to posterior L knee, and all needs met.   Therapy Documentation Precautions:  Precautions Precautions: Fall, Other (comment) Precaution Comments: quadriplegia (LUE/LLE more impaired) PRAFOs Restrictions Weight Bearing Restrictions: No RLE Weight Bearing: Weight bearing as tolerated LLE Weight Bearing: Weight bearing as tolerated   See Function Navigator for Current Functional Status.  Therapy/Group: Individual Therapy  CaAlysia Penna/26/2018, 12:56 PM

## 2017-01-29 NOTE — Progress Notes (Signed)
Occupational Therapy Session Note  Patient Details  Name: Aimee [name] MRN: [id number] Date of Birth: 27-Jul-1976  Today's Date: 01/29/2017 OT Individual Time: 0700-0800 OT Individual Time Calculation (min): 60 min    Short Term Goals: Week 3:  OT Short Term Goal 1 (Week 3): Pt will perform UB dressing tasks with supervision OT Short Term Goal 2 (Week 3): Pt will complete LB dressing tasks with supervision OT Short Term Goal 3 (Week 3): Pt will complete shower transfer with supervisoin OT Short Term Goal 4 (Week 3): Pt will complete toilet transfer with supervisoin  Skilled Therapeutic Interventions/Progress Updates:    Pt engaged in bathing/dressing tasks with focus on functional amb with RW, simple home mgmt tasks at RW level, functional transfers, and standing balance.  Pt completed all tasks at supervision level, requiring assistance only with shower transfers and fastening her bra in back.  Pt requires more than a reasonable amount of time to complete tasks.  Pt demonstrates improved BUE use for all functional tasks.  Pt returned to w/c and remained in w/c eating breakfast.   Therapy Documentation Precautions:  Precautions Precautions: Fall, Other (comment) Precaution Comments: quadriplegia (LUE/LLE more impaired) PRAFOs Restrictions Weight Bearing Restrictions: No RLE Weight Bearing: Weight bearing as tolerated LLE Weight Bearing: Weight bearing as tolerated   Pain:  Pt c/o 9/10 pain in L gastroc area; RN and MD aware  See Function Navigator for Current Functional Status.   Therapy/Group: Individual Therapy  Leroy Libman 01/29/2017, 12:06 PM

## 2017-01-29 NOTE — Progress Notes (Signed)
Occupational Therapy Session Note  Patient Details  Name: Aimee [name] Plan MRN: [id number] Date of Birth: 1975-11-20  Today's Date: 01/29/2017 OT Individual Time: 1000-1100 OT Individual Time Calculation (min): 60 min    Short Term Goals: Week 3:  OT Short Term Goal 1 (Week 3): Pt will perform UB dressing tasks with supervision OT Short Term Goal 2 (Week 3): Pt will complete LB dressing tasks with supervision OT Short Term Goal 3 (Week 3): Pt will complete shower transfer with supervisoin OT Short Term Goal 4 (Week 3): Pt will complete toilet transfer with supervisoin  Skilled Therapeutic Interventions/Progress Updates:   Upon entering the room, pt seated in wheelchair awaiting therapist. Pt with c/o pain in L LE and edema present. RN already aware. Pt propelled wheelchair to ADL apartment at mod I level. Pt demonstrated transfer with use of RW onto TTB with min A for L LE into tub. OT recommends use of safety treads in tub to decrease fall risk. OT educating pt on kitchen safety with mobility as well as functional task with use of RW. Pt demonstrated ability to obtain and transfer items from refrigerator to sink and counter with overall supervision. Pt returning to wheelchair and propelling self back to room in same manner as above. Ice applied to L LE per pt request. RN notified. Call bell and all needed items within reach.   Therapy Documentation Precautions:  Precautions Precautions: Fall, Other (comment) Precaution Comments: quadriplegia (LUE/LLE more impaired) PRAFOs Restrictions Weight Bearing Restrictions: No RLE Weight Bearing: Weight bearing as tolerated LLE Weight Bearing: Weight bearing as tolerated General:   Vital Signs:   Pain:   ADL: ADL ADL Comments: refer functional navigator  See Function Navigator for Current Functional Status.   Therapy/Group: Individual Therapy  Gypsy Decant 01/29/2017, 12:30 PM

## 2017-01-29 NOTE — Progress Notes (Signed)
Physical Therapy Note  Patient Details  Name: [name] Plan MRN: [id number] Date of Birth: 08/01/1976 Today's Date: 01/29/2017    Time: 830-925 55 minutes  1:1 pt c/o pain behind Lt knee, MD present and suspects baker's cyst.  Pt asks to limit wt bearing during session.  Supine NMR for Lt hip strengthening and core strengthening.  Bridging, LTR, hip abd, knee/hip flex/ext on theraball, SLR, bridge and LTR with adductor squeeze.  Gait short distances with supervision with RW.  nustep x 10 minutes level 4 with UE/LE for strength and endurance. Ice placed behind knee and Lt LE elevated at end of session.  Keola Heninger 01/29/2017, 9:28 AM

## 2017-01-29 NOTE — Progress Notes (Signed)
[place] PHYSICAL MEDICINE & REHABILITATION     PROGRESS NOTE    Subjective/Complaints: No new issues overnight. Left leg pain behind the knee, this partially responds to BenGay cream, but still bothers the patient. Patient has had a history of left knee pain in the past prior to her subarachnoid hemorrhage. Reviewed prior venous duplex ultrasounds performed 6 6 and 01/16/2017 demonstrating left calf DVT,  but no propagation into the popliteal vein  Tolerating therapy well and progressing well with physical therapy.  ROS: pt denies nausea, vomiting, diarrhea, cough, shortness of breath or chest pain   Objective: Vital Signs: Blood pressure 109/64, pulse 92, temperature 98.1 F (36.7 C), temperature source Oral, resp. rate 18, weight 86.2 kg (190 lb 0.6 oz), last menstrual period 12/31/2016, SpO2 100 %. No results found. No results for input(s): WBC, HGB, HCT, PLT in the last 72 hours. No results for input(s): NA, K, CL, GLUCOSE, BUN, CREATININE, CALCIUM in the last 72 hours.  Invalid input(s): CO CBG (last 3)   Recent Labs  01/28/17 1753 01/28/17 2152 01/29/17 0652  GLUCAP 155* 114* 109*    Wt Readings from Last 3 Encounters:  01/29/17 86.2 kg (190 lb 0.6 oz)  01/07/17 88.9 kg (196 lb)    Physical Exam:  Constitutional: She appears well-developed. NAD. HENT: Normocephalic and atraumatic.  Eyes: EOMI. No discharge.  Cardiovascular: RRR without murmur. No JVD  Respiratory: CTA Bilaterally without wheezes or rales. Normal effort  GI: soft, BS+  Musculoskeletal: She exhibits no edema and mild calf tenderness persists on left, Calves are soft bilaterally. Negative Homans, negative Thompson's test  Neurological: Alert and oriented  Motor: RUE: 4+/5 proximal to distal LUE: Shoulder abduction 4/5, elbow flex/ext 4/5, hand grip 4/5 RLE: 4/5 right hip flexion, KE, 4/5 ADF/PF LLE:  4/5 proximal to 4/5 distal -intact light touch and pain in all 4's Skin: Skin is warm and  dry. 1 plus ankle edema bilaterally  Psychiatric: She has a normal mood and affect. Her behavior is normal.   Assessment/Plan: 1. 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 Physiatrist is providing close team supervision and 24 hour management of active medical problems listed below. Physiatrist and rehab team continue to assess barriers to discharge/monitor patient progress toward functional and medical goals.  Function:  Bathing Bathing position   Position: Shower  Bathing parts Body parts bathed by patient: Right arm, Left arm, Chest, Abdomen, Front perineal area, Buttocks, Right upper leg, Left upper leg, Right lower leg, Left lower leg Body parts bathed by helper: Buttocks  Bathing assist Assist Level: Supervision or verbal cues      Upper Body Dressing/Undressing Upper body dressing   What is the patient wearing?: Bra, Pull over shirt/dress Bra - Perfomed by patient: Thread/unthread right bra strap, Thread/unthread left bra strap Bra - Perfomed by helper: Hook/unhook bra (pull down sports bra) Pull over shirt/dress - Perfomed by patient: Thread/unthread right sleeve, Thread/unthread left sleeve, Put head through opening, Pull shirt over trunk Pull over shirt/dress - Perfomed by helper: Pull shirt over trunk        Upper body assist Assist Level: Touching or steadying assistance(Pt > 75%)   Set up : To obtain clothing/put away  Lower Body Dressing/Undressing Lower body dressing   What is the patient wearing?: Underwear, Pants, Non-skid slipper socks Underwear - Performed by patient: Thread/unthread right underwear leg, Pull underwear up/down, Thread/unthread  left underwear leg Underwear - Performed by helper: Thread/unthread left underwear leg Pants- Performed by patient: Thread/unthread right pants leg, Thread/unthread left  pants leg, Pull pants up/down Pants- Performed by helper: Thread/unthread left pants leg   Non-skid slipper socks- Performed by helper: Don/doff right sock, Don/doff left sock Socks - Performed by patient: Don/doff right sock, Don/doff left sock Socks - Performed by helper: Don/doff right sock Shoes - Performed by patient: Don/doff right shoe, Fasten right, Fasten left (with short stool) Shoes - Performed by helper: Don/doff left shoe       TED Hose - Performed by helper: Don/doff right TED hose, Don/doff left TED hose  Lower body assist Assist for lower body dressing: Touching or steadying assistance (Pt > 75%)      Toileting Toileting Toileting activity did not occur: N/A Toileting steps completed by patient: Adjust clothing prior to toileting, Performs perineal hygiene, Adjust clothing after toileting Toileting steps completed by helper: Adjust clothing prior to toileting, Performs perineal hygiene, Adjust clothing after toileting Toileting Assistive Devices: Grab bar or rail  Toileting assist Assist level: Supervision or verbal cues   Transfers Chair/bed transfer   Chair/bed transfer method: Ambulatory, Stand pivot Chair/bed transfer assist level: Touching or steadying assistance (Pt > 75%) Chair/bed transfer assistive device: Armrests, Walker Mechanical lift: Ecologist Ambulation activity did not occur: Safety/medical concerns (quadriplegia)   Max distance: 66ft (backward walking ) Assist level: Touching or steadying assistance (Pt > 75%)   Wheelchair   Type:  (TBD) Max wheelchair distance: 146ft Assist Level: Supervision or verbal cues  Cognition Comprehension Comprehension assist level: Follows complex conversation/direction with extra time/assistive device  Expression Expression assist level: Expresses basic 90% of the time/requires cueing < 10% of the time., Expresses complex ideas: With no assist  Social Interaction Social Interaction assist  level: Interacts appropriately with others - No medications needed.  Problem Solving Problem solving assist level: Solves complex problems: Recognizes & self-corrects  Memory Memory assist level: Complete Independence: No helper   Medical Problem List and Plan: 1.  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  DVT Prophylaxis/Anticoagulation: SCDs.  Left gastroc thrombus---ROM/activity/heat and massage  -f/u doppler unchanged 6/13, patient complaining of pain behind the knee will repeat Doppler once again to evaluate for propagation proximally 3. Pain Management: Ultram as needed  -Bilateral calf strain, improved with BenGay cream 4. Mood: Provide emotional support 5. Neuropsych: This patient is capable of making decisions on her own behalf. 6. Skin/Wound Care: Routine skin checks 7. Fluids/Electrolytes/Nutrition: Routine I&Os  -encourage PO 8. Seizure prophylaxis. Keppra 500 mg every 12 hours, no seizure activity 6/26     9. Diabetes mellitus.  Resumed glucophage 500mg  qd with good control 6/26 CBG (last 3)   Recent Labs  01/28/17 1753 01/28/17 2152 01/29/17 0652  GLUCAP 155* 114* 109*    11. Obesity. BMI 34.7 KG. Dietary follow-up    LOS (Days) 22 A FACE TO FACE EVALUATION WAS PERFORMED  Charlett Blake, MD 01/29/2017 10:45 AM

## 2017-01-29 NOTE — Consult Note (Signed)
Neuropsychological Consultation   Patient:   Aimee [name]   DOB:   July 06, 1976  MR Number:  [id number]  Location:  [facility] [address] [id number] [place] [address] Dept: [phone] YWV: [phone]           Date of Service:   01/29/2017  Start Time:   1 PM End Time:   2 PM  Provider/Observer:  Ilean Skill, Psy.D.       Clinical Neuropsychologist       Billing Code/Service: [phone] 4 Units  Chief Complaint:    Aimee [name] is a 41 year old female post diffuse subarachnoid hemorrhage filling cerebral spinal fluid spaces in the suprasellar and basal cisterns. 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 The patient has had significant left-sided weakness, which is improving. There were concerns about coping with the acute/sudden neurological changes. Neuropsychological consultation was requested.  Reason for Service:  Aimee [name] a 41 year old Hispanic right-handed female recently diagnosed with type 2 diabetes with obesity and hypertension. The patient presented on 12/25/2016 unresponsive with decerebrated posturing. Subarachnoid hemorrhage was identified along with arterial aneurysm.  Below is the HPI for the current admission.   Aimee [name] a 41 y.o.Hispanic right handed femalewith history of reported obesity, hypertension, diabetes mellitus.Per chart review, patient lives with brother and sister.  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 Third level apartment with multiple stairs.Presented 12/25/2016 unresponsive with reported decerebrate posturing. Intubated in the ED. CT  of the head reviewed, showing SAH. Per report, diffuse subarachnoid hemorrhage filling CSF spaces in the suprasellar and basal  cisterns as well as bilateral sulci.CT angiogram of head and  neck showed a 7 x 6 x 5 mm right posterior communicating artery aneurysm. Patient underwent coiling per interventional radiology.Keppra ongoing for seizure prophylaxis. MRI of the brain  showed scattered acute embolic infarcts in both cerebral hemispheres and left cerebellum. Echocardiogram ejection fraction of 03% grade 1 diastolic dysfunction. Noted quadriparesis with  ongoing follow-up per neurosurgery as well as neurology.Patient currently maintained on Decadron protocol. Blood pressure monitored with Nimotop. Nasogastric tube in place for nutritional  support and died advanced to mechanical soft nectar thick liquids .Physical and occupational therapy evaluations completed 01/01/2017 with recommendations of physical medicine  rehabilitation consult.Patient was admitted for a comprehensive rehabilitation program  Current Status:  The patient has displayed significant improvements during rehabilitation program.  The patient reports that anxiety and worry about status was significant once she was aware of what had happened and what happened with mother when the patient was 89 years old.  (Mother is reported to have had similar event with unresolved left side paralysis) .  Reliability of Information: Information derived from one hour face to face, review of medical records and direct communication with treatment team members.  Behavioral Observation: Aimee [name]  presents as a 41 y.o.-year-old Right Hispanic Female who appeared her stated age. her dress was Appropriate and she was Well Groomed and her manners were Appropriate to the situation.  her participation was indicative of Appropriate and Attentive behaviors.  There were physical disabilities noted related to left leg weakness and burning pain in left leg.  she displayed an appropriate level of cooperation and motivation.     Interactions:    Active Appropriate and  Attentive  Attention:   within normal limits and attention  span and concentration were age appropriate  Memory:   within normal limits; recent and remote memory intact  Visuo-spatial:  within normal limits  Speech (Volume):  normal  Speech:   normal;   Thought Process:  Coherent and Relevant  Though Content:  WNL;   Orientation:   person, place, time/date and situation  Judgment:   Good  Planning:   Good  Affect:    Anxious  Mood:    Anxious  Insight:   Good  Intelligence:   normal  Marital Status/Living: The patient currently lives with brother and he is planning to move to first floor apartment.    Substance Use:  No concerns of substance abuse are reported.    Education:     Medical History:   Past Medical History:  Diagnosis Date  . Diabetes mellitus without complication ([place])   . Hypertension         Abuse/Trauma History:   Psychiatric History:  No indication of prior psychiatric history  Family Med/Psych History: History reviewed. No pertinent family history.  Risk of Suicide/Violence: virtually non-existent Patient denies any SI or HI  Impression/DX:  Aimee [name] a 41 year old Hispanic right-handed female recently diagnosed with type 2 diabetes with obesity and hypertension. The patient presented on 12/25/2016 unresponsive with decerebrated posturing. Subarachnoid hemorrhage was identified along with arterial aneurysm.  She has been dealing with worry and anxiety (which is improving a great deal as she improves physically).     Disposition/[name]:  Worked with the patient regarding coping and adaption efforts following SAH due to aneurysm and diffuse embolic infarcts.            Electronically Signed   _______________________ Ilean Skill, Psy.D.

## 2017-01-29 NOTE — Plan of Care (Signed)
Problem: RH Bathing Goal: LTG Patient will bathe with assist, cues/equipment (OT) LTG: Patient will bathe specified number of body parts with assist with/without cues using equipment (position)  (OT)  Goal upgraded due to pt progress. Alexsander Cavins Lewis, OTR/L  Problem: RH Dressing Goal: LTG Patient will perform lower body dressing w/assist (OT) LTG: Patient will perform lower body dressing with assist, with/without cues in positioning using equipment (OT)  Goal upgraded due to pt progress. Anagabriela Jokerst Lewis, OTR/L  Problem: RH Toileting Goal: LTG Patient will perform toileting w/assist, cues/equip (OT) LTG: Patient will perform toiletiing (clothes management/hygiene) with assist, with/without cues using equipment (OT)  Goal upgraded due to pt progress. Javontae Marlette Lewis, OTR/L  Problem: RH Toilet Transfers Goal: LTG Patient will perform toilet transfers w/assist (OT) LTG: Patient will perform toilet transfers with assist, with/without cues using equipment (OT)  Goal upgraded due to pt progress. Zayda Angell Lewis, OTR/L  Problem: RH Tub/Shower Transfers Goal: LTG Patient will perform tub/shower transfers w/assist (OT) LTG: Patient will perform tub/shower transfers with assist, with/without cues using equipment (OT)  Goal upgraded due to pt progress. Deuntae Kocsis Lewis, OTR/L  Comments: After speaking with pt's primary COTA and treating one session with pt, goals have been upgraded to overall supervision level due to pt progress. See POC for goal details. Napoleon Form, OTR/L

## 2017-01-30 ENCOUNTER — Inpatient Hospital Stay (HOSPITAL_COMMUNITY): Payer: Self-pay | Admitting: *Deleted

## 2017-01-30 ENCOUNTER — Inpatient Hospital Stay (HOSPITAL_COMMUNITY): Payer: Self-pay

## 2017-01-30 ENCOUNTER — Inpatient Hospital Stay (HOSPITAL_COMMUNITY): Payer: Self-pay | Admitting: Physical Therapy

## 2017-01-30 DIAGNOSIS — Z86718 Personal history of other venous thrombosis and embolism: Secondary | ICD-10-CM

## 2017-01-30 DIAGNOSIS — M7989 Other specified soft tissue disorders: Secondary | ICD-10-CM

## 2017-01-30 LAB — GLUCOSE, CAPILLARY
Glucose-Capillary: 95 mg/dL (ref 65–99)
Glucose-Capillary: 96 mg/dL (ref 65–99)
Glucose-Capillary: 124 mg/dL — ABNORMAL HIGH (ref 65–99)
Glucose-Capillary: 103 mg/dL — ABNORMAL HIGH (ref 65–99)

## 2017-01-30 MED ORDER — RIVAROXABAN 15 MG PO TABS
15.0000 mg | ORAL_TABLET | Freq: Two times a day (BID) | ORAL | Status: DC
  Administered 2017-01-30 – 2017-02-05 (×12): 15 mg via ORAL
  Filled 2017-01-30 (×12): qty 1

## 2017-01-30 NOTE — Progress Notes (Signed)
Occupational Therapy Session Note  Patient Details  Name: Aimee [name] MRN: [id number] Date of Birth: 07/14/1976  Today's Date: 01/30/2017 OT Individual Time: 0700-0800 OT Individual Time Calculation (min): 60 min    Short Term Goals: Week 3:  OT Short Term Goal 1 (Week 3): Pt will perform UB dressing tasks with supervision OT Short Term Goal 2 (Week 3): Pt will complete LB dressing tasks with supervision OT Short Term Goal 3 (Week 3): Pt will complete shower transfer with supervisoin OT Short Term Goal 4 (Week 3): Pt will complete toilet transfer with supervisoin  Skilled Therapeutic Interventions/Progress Updates:    Pt resting in bed upon arrival.  Pt agreeable to participating in therapy.  Pt also c/o ongoing LLE pain in area of knee with ongoing swelling.  Pt amb with RW in room to gather clothing prior to entering bathroom to use toilet and take shower.  Pt completed bathing and toileting tasks at supervision level.  Pt returned to room and completed dressing with sit<>stand from EOB.  Pt required assistance with fastening bra this morning.  Pt does not have any clean sports bras at this time.  Pt brushed hair and pulled back into pony tail.  Pt stood at sink to brush teeth this morning.  Pt returned to w/c and remained in w/c to eat breakfast.  Focus on standing balance, BUE used, functional amb with RW, and safety awareness to increase independence with BADLs.   Therapy Documentation Precautions:  Precautions Precautions: Fall, Other (comment) Precaution Comments: quadriplegia (LUE/LLE more impaired) PRAFOs Restrictions Weight Bearing Restrictions: No RLE Weight Bearing: Weight bearing as tolerated LLE Weight Bearing: Weight bearing as tolerated Pain: Pain Assessment Pain Assessment: 0-10 Pain Score: 7  Pain Type: Acute pain Pain Location: Leg Pain Orientation: Left Pain Descriptors / Indicators: Aching Pain Frequency: Constant Pain Onset: On-going Patients Stated  Pain Goal: 2 Pain Intervention(s): RN aware and meds admin   See Function Navigator for Current Functional Status.   Therapy/Group: Individual Therapy  Leroy Libman 01/30/2017, 10:32 AM

## 2017-01-30 NOTE — Progress Notes (Addendum)
Physical Therapy Session Note  Patient Details  Name: Aimee [name] Plan MRN: [id number] Date of Birth: 1976/06/08  Today's Date: 01/30/2017 PT Individual Time: 0900-0930; 1130-1200 PT Individual Time Calculation (min): 30 min ; 30 min PT Concurrent Time: 1030-1130 PT Concurrent Time Calculation (min): 60 min  Short Term Goals: Week 4:  PT Short Term Goal 1 (Week 4): = LTGs due to ELOS.  Skilled Therapeutic Interventions/Progress Updates:  Tx 1:  Pt received seated in w/c with report of 5/10 pain in L posterior knee but agreeable to treatment. Pt total A to rehab gym. Pt ambulated 25'x4 trials with no AD and min guard>S; shortened step length B and lack of arm swing. During 1st trial pt had 1 lob requiring min A to correct when turning to sit in chair; during final ambulation trial pt had 1 lob with min A to correct and pt demonstrating hip and stepping strategy to correct. Pt total A to return to pt's room where she remained seated in w/c with LEs elevated and all needs met.   Tx 2: Pt received seated in w/c requesting to use restroom before leaving room. Pt ambulated to restroom and completed toileting with S but required mod A for sit>stand from regular toilet. Pt total A to cafeteria to simulate community setting. Pt ambulated min guard/S >218f in cafeteria with RW collecting items from counter and refridgerator. Discussed energy conservation and planning techniques for community outings and inside home once pt is discharged. Pt ambulated >1532ffrom cafeteria to elevator with RW and min guard with pt attempting to open door, requiring min A. Pt total A to rehab gym. Lateral stepping x2524fith min A for manual facilitation at L knee to prevent buckling prn. Pt reports increased L LE pain. Stand pivot transfer from w/c<>mat with min A for manual facilitation at L knee to prevent buckling prn. Sidelying hip abduction 2x10 each LE with verbal cues for technique. Total A return to patient's room  where she remained seated in w/c with LEs elevated and all needs in reach.   Therapy Documentation Precautions:  Precautions Precautions: Fall, Other (comment) Precaution Comments: quadriplegia (LUE/LLE more impaired) PRAFOs Restrictions Weight Bearing Restrictions: No RLE Weight Bearing: Weight bearing as tolerated LLE Weight Bearing: Weight bearing as tolerated   See Function Navigator for Current Functional Status.   Therapy/Group: Individual Therapy  CarAlysia Penna27/2018, 2:44 PM

## 2017-01-30 NOTE — Progress Notes (Signed)
[facility]     PROGRESS NOTE    Subjective/Complaints: Post left knee pain unchanged no increased swelling in LLE  ROS: pt denies nausea, vomiting, diarrhea, cough, shortness of breath or chest pain   Objective: Vital Signs: Blood pressure 104/68, pulse 67, temperature 99.8 F (37.7 C), temperature source Oral, resp. rate 16, weight 86.2 kg (190 lb 0.6 oz), last menstrual period 12/31/2016, SpO2 98 %. No results found. No results for input(s): WBC, HGB, HCT, PLT in the last 72 hours. No results for input(s): NA, K, CL, GLUCOSE, BUN, CREATININE, CALCIUM in the last 72 hours.  Invalid input(s): CO CBG (last 3)   Recent Labs  01/29/17 1659 01/29/17 2110 01/30/17 0658  GLUCAP 103* 98 95    Wt Readings from Last 3 Encounters:  01/29/17 86.2 kg (190 lb 0.6 oz)  01/07/17 88.9 kg (196 lb)    Physical Exam:  Constitutional: She appears well-developed. NAD. HENT: Normocephalic and atraumatic.  Eyes: EOMI. No discharge.  Cardiovascular: RRR without murmur. No JVD  Respiratory: CTA Bilaterally without wheezes or rales. Normal effort  GI: soft, BS+  Musculoskeletal: She exhibits no edema and mild calf tenderness persists on left, Calves are soft bilaterally. Negative Homans, negative Thompson's test  Neurological: Alert and oriented  Motor: RUE: 4+/5 proximal to distal LUE: Shoulder abduction 4/5, elbow flex/ext 4/5, hand grip 4/5 RLE: 4/5 right hip flexion, KE, 4/5 ADF/PF LLE:  4/5 proximal to 4/5 distal -intact light touch and pain in all 4's Skin: Skin is warm and dry. 1 plus ankle edema bilaterally  Psychiatric: She has a normal mood and affect. Her behavior is normal.   Assessment/Plan: 1. 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 Physiatrist is providing close team supervision and 24  hour management of active medical problems listed below. Physiatrist and rehab team continue to assess barriers to discharge/monitor patient progress toward functional and medical goals.  Function:  Bathing Bathing position   Position: Shower  Bathing parts Body parts bathed by patient: Right arm, Left arm, Chest, Abdomen, Front perineal area, Buttocks, Right upper leg, Left upper leg, Right lower leg, Left lower leg Body parts bathed by helper: Buttocks  Bathing assist Assist Level: Supervision or verbal cues      Upper Body Dressing/Undressing Upper body dressing   What is the patient wearing?: Bra, Pull over shirt/dress Bra - Perfomed by patient: Thread/unthread right bra strap, Thread/unthread left bra strap Bra - Perfomed by helper: Hook/unhook bra (pull down sports bra) Pull over shirt/dress - Perfomed by patient: Thread/unthread right sleeve, Thread/unthread left sleeve, Put head through opening, Pull shirt over trunk Pull over shirt/dress - Perfomed by helper: Pull shirt over trunk        Upper body assist Assist Level: Touching or steadying assistance(Pt > 75%)   Set up : To obtain clothing/put away  Lower Body Dressing/Undressing Lower body dressing   What is the patient wearing?: Underwear, Pants, Non-skid slipper socks Underwear - Performed by patient: Thread/unthread right underwear leg, Pull underwear up/down, Thread/unthread left underwear leg Underwear - Performed by helper: Thread/unthread left underwear leg Pants- Performed by patient: Thread/unthread right pants leg, Thread/unthread left pants leg, Pull pants up/down Pants- Performed by helper: Thread/unthread left pants leg   Non-skid slipper socks- Performed by helper: Don/doff right sock, Don/doff left sock Socks - Performed by patient: Don/doff right sock, Don/doff left sock  Socks - Performed by helper: Don/doff right sock Shoes - Performed by patient: Don/doff right shoe, Fasten right, Fasten left (with  short stool) Shoes - Performed by helper: Don/doff left shoe       TED Hose - Performed by helper: Don/doff right TED hose, Don/doff left TED hose  Lower body assist Assist for lower body dressing: Touching or steadying assistance (Pt > 75%)      Toileting Toileting Toileting activity did not occur: N/A Toileting steps completed by patient: Adjust clothing prior to toileting, Performs perineal hygiene, Adjust clothing after toileting Toileting steps completed by helper: Adjust clothing prior to toileting, Performs perineal hygiene, Adjust clothing after toileting Toileting Assistive Devices: Grab bar or rail  Toileting assist Assist level: Supervision or verbal cues   Transfers Chair/bed transfer   Chair/bed transfer method: Ambulatory Chair/bed transfer assist level: Supervision or verbal cues Chair/bed transfer assistive device: Environmental manager lift: Ecologist Ambulation activity did not occur: Safety/medical concerns (quadriplegia)   Max distance: 138ft Assist level: Supervision or verbal cues   Wheelchair   Type:  (TBD) Max wheelchair distance: 168ft Assist Level: Supervision or verbal cues  Cognition Comprehension Comprehension assist level: Follows complex conversation/direction with extra time/assistive device  Expression Expression assist level: Expresses basic 90% of the time/requires cueing < 10% of the time., Expresses complex ideas: With no assist  Social Interaction Social Interaction assist level: Interacts appropriately with others - No medications needed.  Problem Solving Problem solving assist level: Solves complex problems: Recognizes & self-corrects  Memory Memory assist level: Complete Independence: No helper   Medical Problem List and Plan: 1.  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  DVT Prophylaxis/Anticoagulation: SCDs.  Left gastroc thrombus---ROM/activity/heat and massage  -f/u doppler unchanged 6/13, patient complaining of pain behind the knee will repeat Doppler once again to evaluate for propagation proximally-schedule for 2pm today, no restriction for therapy 3. Pain Management: Ultram as needed  -Bilateral calf strain, improved with BenGay cream 4. Mood: Provide emotional support 5. Neuropsych: This patient is capable of making decisions on her own behalf. 6. Skin/Wound Care: Routine skin checks 7. Fluids/Electrolytes/Nutrition: Routine I&Os  -encourage PO 8. Seizure prophylaxis. Keppra 500 mg every 12 hours, no seizure activity 6/27     9. Diabetes mellitus.  Resumed glucophage 500mg  qd with good control 6/27 CBG (last 3)   Recent Labs  01/29/17 1659 01/29/17 2110 01/30/17 0658  GLUCAP 103* 98 95    11. Obesity. BMI 34.7 KG. Dietary follow-up    LOS (Days) 23 A FACE TO FACE EVALUATION WAS PERFORMED  Charlett Blake, MD 01/30/2017 10:22 AM

## 2017-01-30 NOTE — Patient Care Conference (Signed)
Inpatient RehabilitationTeam Conference and [name] of Care Update Date: 01/29/2017   Time: 2:00 PM    Patient Name: Aimee [name]      Medical Record Number: [id number]  Date of Birth: 1976-02-27 Sex: Female         Room/Bed: 4W12C/4W12C-01 Payor Info: Payor: /    Admitting Diagnosis: SAH sp [place] Multiple B CVA  Admit Date/Time:  01/07/2017  6:56 PM Admission Comments: No comment available   Primary Diagnosis:  Subarachnoid hemorrhage due to ruptured aneurysm [name]) Principal Problem: Subarachnoid hemorrhage due to ruptured aneurysm [name])  Patient Active Problem List   Diagnosis Date Noted  . Gait disturbance, post-stroke 01/24/2017  . Thrombus   . E-coli UTI   . Reactive hypertension   . Seizure prophylaxis   . Quadriparesis ([place])   . Class 1 obesity due to excess calories with serious comorbidity and body mass index (BMI) of 30.0 to 30.9 in adult   . Benign essential HTN   . Diabetes mellitus type 2 in obese ([place])   . Leukocytosis   . Acute encephalopathy   . Somnolence   . Seizure ([place])   . Stupor   . Ventilator dependent ([place])   . SAH (subarachnoid hemorrhage) ([place]) 12/25/2016  . Subarachnoid hemorrhage due to ruptured aneurysm ([place]) 12/25/2016    Expected Discharge Date: Expected Discharge Date: 02/05/17  Team Members Present: Physician leading conference: Dr. Delice Lesch Social Worker Present: Aimee Pall, LCSW Nurse Present: Aimee Chihuahua, RN PT Present: Aimee [name], PT OT Present: Aimee [name], [place], OT SLP Present: Aimee [name], SLP PPS Coordinator present : Aimee Nakayama, RN, CRRN     Current Status/Progress Goal Weekly Team Focus  Medical   Patient with left calf pain, which has been chronic, although now states it is behind the knee.  Monitor for signs of DVT. Provocation  Repeat Doppler venous ultrasound   Bowel/Bladder   continent of bladder and bowel  maintain continence level  remain continent all the time    Swallow/Nutrition/ Hydration             ADL's   bahting-supervision, UB dressing-min A, LB dressing-steady A, functional transfers/functional amb with RW-supervision/steady A  upgraded to supervision overall  activity tolerance, safety awareness, standing balance, family educaiton   Mobility   min guard/close S with stand pivot transfers and ambulation, minA/min guard sit>stand, S bed mobility, min A stair negotation   S bed mobility, transfers, and ambulation, min A 1 stair   stand pivot transfers, ambulation, sit>stand and ambulation without AD, endurance, LE strengthening    Communication             Safety/Cognition/ Behavioral Observations            Pain   left leg pain controlled by Tramadol  less<3  monitor and adress it as need it   Skin   intact  no breakdown  monitor every shift    Rehab Goals Patient on target to meet rehab goals: Yes *See Care [name] and progress notes for long and short-term goals.  Barriers to Discharge: See above    Possible Resolutions to Barriers:  See above, continue rehabilitation    Discharge Planning/Teaching Needs:  [name] to d/c home with family providing 24/7 assistance.  [name] to go to sister-in-law's home which is one level and accessible for w/c.  Will contact family to begin education.   Team Discussion:  Pain in left leg - MD aware;  Known DVT.  Goals upgraded to supervision  with therapies.  Currently min - guard overall.  On track for d/c 7/3 and SW to contact family about education.  Revisions to Treatment [name]:  Most goals upgraded   Continued Need for Acute Rehabilitation Level of Care: The patient requires daily medical management by a physician with specialized training in physical medicine and rehabilitation for the following conditions: Daily direction of a multidisciplinary physical rehabilitation program to ensure safe treatment while eliciting the highest outcome that is of practical value to the patient.: Yes Daily  medical management of patient stability for increased activity during participation in an intensive rehabilitation regime.: Yes Daily analysis of laboratory values and/or radiology reports with any subsequent need for medication adjustment of medical intervention for : Neurological problems  Aimee [name] 01/30/2017, 1:46 PM

## 2017-01-30 NOTE — Plan of Care (Signed)
Problem: RH SKIN INTEGRITY Goal: RH STG SKIN FREE OF INFECTION/BREAKDOWN With min assist  Outcome: Progressing No skin issues noted Goal: RH STG MAINTAIN SKIN INTEGRITY WITH ASSISTANCE STG Maintain Skin Integrity With min Assistance.   Outcome: Progressing Skin clean, dry and intact  Problem: RH SAFETY Goal: RH STG ADHERE TO SAFETY PRECAUTIONS W/ASSISTANCE/DEVICE STG Adhere to Safety Precautions With min  Assistance/Device.  Outcome: Progressing No safety issues noted Goal: RH STG DECREASED RISK OF FALL WITH ASSISTANCE STG Decreased Risk of Fall With min Assistance.   Outcome: Progressing Patient calls for assistance  Problem: RH PAIN MANAGEMENT Goal: RH STG PAIN MANAGED AT OR BELOW [name] PAIN GOAL At or below level 4  Outcome: Progressing Medicated once for pain in the right leg with moderate relief, resting in the bed with eyes closed at present time

## 2017-01-30 NOTE — Progress Notes (Signed)
Social Work Patient ID: Aimee [name], female   DOB: Nov 14, 1975, 41 y.o.   MRN: [id number]   Have reviewed team conference with pt and brother, [name] (via phone).  All pleased with continued progress, upgraded goals and [name] for d/c still on 7/3.  Have scheduled for family to be here on Friday for education 10-12.  Tx aware.  Jemima Petko, LCSW

## 2017-01-30 NOTE — Progress Notes (Signed)
Follow-up vascular studies lower extremities for monitoring of left gastrocnemius DVT show acute DVT involving the tibioperoneal trunk, the right posterior tibial, peroneal and gastrocnemius veins of the right lower extremity. Mixed a deep vein thromboses involving the left lower extremity. Acute DVT in the proximal and mid femoral vein appearing chronic in the distal area. Indeterminate age thrombosis left popliteal, posterior tibial and peroneal veins of the left lower extremity. Plan cranial CT scan to follow-up for posterior communicating artery aneurysm clipping to check for any fresh blood. Spoke with  neurology services  ifCT scan shows no fresh blood began anticoagulation.

## 2017-01-30 NOTE — Progress Notes (Signed)
*  PRELIMINARY RESULTS* Vascular Ultrasound Bilateral lower extremity venous duplex has been completed.  Preliminary findings:Findings consistent with acute deep vein thrombosis involving the tibioperoneal trunk, the right posterior tibial, peroneal and gastrocnemius veins of the right lower extremity.  Findings consistent with mixed age deep vein thrombosis involving the left lower extremity. Acute deep vein thrombosis is noted in the proximal and mid femoral vein appearing more chronic in the distal areas.  Indeterminate age thrombosis in the left popliteal, posterior tibial, and peroneal veins of the left lower extremity.  Preliminary results given to nurse, Jiles Garter @ 14:00.  Everrett Coombe 01/30/2017, 3:07 PM

## 2017-01-30 NOTE — Progress Notes (Signed)
Recreational Therapy Assessment and Plan  Patient Details  Name: [name] Plan MRN: [id number] Date of Birth: Jul 21, 1976 Today's Date: 01/30/2017  Rehab Potential:  Good ELOS:   d/c 6/30  Assessment Problem List:      Patient Active Problem List   Diagnosis Date Noted  . Seizure prophylaxis   . 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 Class 1 obesity due to excess calories with serious comorbidity and body mass index (BMI) of 30.0 to 30.9 in adult   . Benign essential HTN   . Diabetes mellitus type 2 in obese ([place])   . Leukocytosis   . Acute encephalopathy   . Somnolence   . Seizure ([place])   . Stupor   . Ventilator dependent ([place])   . SAH (subarachnoid hemorrhage) ([place]) 12/25/2016  . Subarachnoid hemorrhage due to ruptured aneurysm ([place]) 12/25/2016    Past Medical History:      Past Medical History:  Diagnosis Date  . Diabetes mellitus without complication ([place])   . Hypertension    Past Surgical History:       Past Surgical History:  Procedure Laterality Date  . IR ANGIO INTRA EXTRACRAN SEL INTERNAL CAROTID BILAT MOD SED  12/25/2016  . IR ANGIO VERTEBRAL SEL VERTEBRAL UNI L MOD SED  12/25/2016  . IR ANGIOGRAM FOLLOW UP STUDY  12/25/2016  . IR ANGIOGRAM FOLLOW UP STUDY  12/25/2016  . IR ANGIOGRAM FOLLOW UP STUDY  12/25/2016  . IR ANGIOGRAM FOLLOW UP STUDY  12/25/2016  . IR TRANSCATH/EMBOLIZ  12/25/2016  . RADIOLOGY WITH ANESTHESIA N/A 12/25/2016   Procedure: RADIOLOGY WITH ANESTHESIA;  Surgeon: Consuella Lose, MD;  Location: [place];  Service: Radiology;  Laterality: N/A;    Assessment & Plan Clinical Impression: [name] a 41 y.o.Hispanic right handed femalewith history of reported obesity, hypertension, diabetes mellitus.Per chart review, patient lives with brother and sister. 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 Third level apartment with multiple stairs.Presented 12/25/2016 unresponsive with reported decerebrate  posturing. Intubated in the ED. CT of the head reviewed, showing SAH. 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 Patient underwent coiling per interventional radiology.Keppra ongoing for seizure prophylaxis. 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 Echocardiogram ejection fraction of 71% grade 1 diastolic dysfunction. Noted quadriparesis with ongoing follow-up per neurosurgery as well as neurology.Patient currently maintained on Decadron protocol. Blood pressure monitored with Nimotop. Nasogastric tube in place for nutritional support and died advanced to mechanical soft nectar thick liquids .Physical and occupational therapy evaluations completed 01/01/2017 with recommendations of physical medicine rehabilitation consult. Patient was admitted for a comprehensive rehabilitation program. Patient transferred to CIR on 01/07/2017.     Pt referred for participation in community reintegration/outing scheduled for today and is agreeable to participate.  Purpose of outing and potential goals discussed.  Pt stated understanding.  Plan Min 1 time for community reintegration during LOS >60 minutes  Recommendations for other services: None   Discharge Criteria: Patient will be discharged from TR if patient refuses treatment 3 consecutive times without medical reason.  If treatment goals not met, if there is a change in medical status, if patient makes no progress towards goals or if patient is discharged from hospital.  The above assessment, treatment plan, treatment alternatives and goals were discussed and mutually agreed  upon: by patient  [place] 01/30/2017, 8:57 AM

## 2017-01-30 NOTE — Progress Notes (Signed)
Recreational Therapy Session Note  Patient Details  Name: Aimee [name] MRN: [id number] Date of Birth: 1976-06-12 Today's Date: 01/30/2017  Pain: no c/o Skilled Therapeutic Interventions/Progress Updates: Pt participated in community reintegration/outing to the hospital cafeteria at overall contact guard-supervision ambulatory level using RWl.  Goals focused on safe community mobility, identification & negotiation of obstacles, accessing public restroom, energy conservation techniques/education.  See outing goal sheet in shadow chart for full details.  No further TR as pt is expected to discharge home 7/3.  Goals met.  Therapy/Group: Community reintegration  [place] 01/30/2017, 9:02 AM

## 2017-01-31 ENCOUNTER — Inpatient Hospital Stay (HOSPITAL_COMMUNITY): Payer: Self-pay | Admitting: Physical Therapy

## 2017-01-31 ENCOUNTER — Encounter (HOSPITAL_COMMUNITY): Payer: Self-pay | Admitting: *Deleted

## 2017-01-31 ENCOUNTER — Inpatient Hospital Stay (HOSPITAL_COMMUNITY): Payer: Self-pay

## 2017-01-31 LAB — GLUCOSE, CAPILLARY
Glucose-Capillary: 103 mg/dL — ABNORMAL HIGH (ref 65–99)
Glucose-Capillary: 108 mg/dL — ABNORMAL HIGH (ref 65–99)
Glucose-Capillary: 124 mg/dL — ABNORMAL HIGH (ref 65–99)
Glucose-Capillary: 107 mg/dL — ABNORMAL HIGH (ref 65–99)

## 2017-01-31 MED ORDER — RIVAROXABAN 20 MG PO TABS: 20.0000 mg | ORAL_TABLET | Freq: Every day | ORAL | Status: DC

## 2017-01-31 NOTE — Discharge Instructions (Addendum)
Inpatient Rehab Discharge Instructions  Aimee [name] Discharge date and time: No discharge date for patient encounter.   Activities/Precautions/ Functional Status: Activity: activity as tolerated Diet: regular diet Wound Care: none needed Functional status:  ___ No restrictions     ___ Walk up steps independently ___ 24/7 supervision/assistance   ___ Walk up steps with assistance ___ Intermittent supervision/assistance  ___ Bathe/dress independently ___ Walk with walker     _x__ Bathe/dress with assistance ___ Walk Independently    ___ Shower independently ___ Walk with assistance    ___ Shower with assistance ___ No alcohol     ___ Return to work/school ________     COMMUNITY REFERRALS UPON DISCHARGE:    Home Health:   PT     OT                     Agency:  [facility] Phone: [phone]  Medical Equipment/Items Ordered:  Rolling walker, 3n1 commode and tub bench                                                      Agency/Supplier:  [facility] [phone]    Special Instructions:    My questions have been answered and I understand these instructions. I will adhere to these goals and the provided educational materials after my discharge from the hospital.  Patient/Caregiver Signature _______________________________ Date __________  Clinician Signature _______________________________________ Date __________  Please bring this form and your medication list with you to all your follow-up doctor's appointments. Information on my medicine - XARELTO (rivaroxaban)  This medication education was reviewed with me or my healthcare representative as part of my discharge preparation.    WHY WAS XARELTO PRESCRIBED FOR YOU? Xarelto was prescribed to treat blood clots that may have been found in the veins of your legs (deep vein thrombosis) or in your lungs (pulmonary embolism) and to reduce the risk of them occurring again.  What do you need to know about  Xarelto? The starting dose is one 15 mg tablet taken TWICE daily with food for the FIRST 21 DAYS then on (enter date)  02/21/17  the dose is changed to one 20 mg tablet taken ONCE A DAY with your evening meal.  DO NOT stop taking Xarelto without talking to the health care provider who prescribed the medication.  Refill your prescription for 20 mg tablets before you run out.  After discharge, you should have regular check-up appointments with your healthcare provider that is prescribing your Xarelto.  In the future your dose may need to be changed if your kidney function changes by a significant amount.  What do you do if you miss a dose? If you are taking Xarelto TWICE DAILY and you miss a dose, take it as soon as you remember. You may take two 15 mg tablets (total 30 mg) at the same time then resume your regularly scheduled 15 mg twice daily the next day.  If you are taking Xarelto ONCE DAILY and you miss a dose, take it as soon as you remember on the same day then continue your regularly scheduled once daily regimen the next day. Do not take two doses of Xarelto at the same time.   Important Safety Information Xarelto is a blood thinner medicine that can cause bleeding. You should  call your healthcare provider right away if you experience any of the following: ? Bleeding from an injury or your nose that does not stop. ? Unusual colored urine (red or dark brown) or unusual colored stools (red or black). ? Unusual bruising for unknown reasons. ? A serious fall or if you hit your head (even if there is no bleeding).  Some medicines may interact with Xarelto and might increase your risk of bleeding while on Xarelto. To help avoid this, consult your healthcare provider or pharmacist prior to using any new prescription or non-prescription medications, including herbals, vitamins, non-steroidal anti-inflammatory drugs (NSAIDs) and supplements.  This website has more information on Xarelto:  [email or web address]

## 2017-01-31 NOTE — Progress Notes (Signed)
Occupational Therapy Session Note  Patient Details  Name: Aimee [name] MRN: [id number] Date of Birth: March 07, 1976  Today's Date: 01/31/2017 OT Individual Time: 0900-1000 OT Individual Time Calculation (min): 60 min    Short Term Goals: Week 3:  OT Short Term Goal 1 (Week 3): Pt will perform UB dressing tasks with supervision OT Short Term Goal 2 (Week 3): Pt will complete LB dressing tasks with supervision OT Short Term Goal 3 (Week 3): Pt will complete shower transfer with supervisoin OT Short Term Goal 4 (Week 3): Pt will complete toilet transfer with supervisoin  Skilled Therapeutic Interventions/Progress Updates:    Pt engaged in ongoing BADL retraining including bathing at shower level and dressing with sit<>stand from EOB.  Focus on increased BUE use for doffing/donning bra and donning socks/shoes. Pt required assistance with fastening bra this morning.  Pt amb in room to gather clothing prior to amb into bathroom for shower.  Pt continues to c/o increased pain in LLE with activity and requested to return to bed at end of session.    Therapy Documentation Precautions:  Precautions Precautions: Fall, Other (comment) Precaution Comments: quadriplegia (LUE/LLE more impaired) PRAFOs Restrictions Weight Bearing Restrictions: No RLE Weight Bearing: Weight bearing as tolerated LLE Weight Bearing: Weight bearing as tolerated General:   Vital Signs:  Pain: Pain Assessment Pain Assessment: 0-10 Pain Score: 4  Pain Type: Acute pain Pain Location: Leg Pain Orientation: Left Pain Descriptors / Indicators: Discomfort;Dull Pain Frequency: Intermittent Pain Onset: Gradual Patients Stated Pain Goal: 0 Pain Intervention(s): Refused Multiple Pain Sites: No ADL: ADL ADL Comments: refer functional navigator Vision   Perception    Praxis   Exercises:   Other Treatments:    See Function Navigator for Current Functional Status.   Therapy/Group: Individual  Therapy  Leroy Libman 01/31/2017, 10:05 AM

## 2017-01-31 NOTE — Progress Notes (Signed)
[facility] PHYSICAL MEDICINE & REHABILITATION     PROGRESS NOTE    Subjective/Complaints: Reviewed vascular studies with patient. Appreciate CT head  ROS: pt denies nausea, vomiting, diarrhea, cough, shortness of breath or chest pain   Objective: Vital Signs: Blood pressure (!) 95/50, pulse 80, temperature 99.8 F (37.7 C), temperature source Oral, resp. rate 16, weight 86.2 kg (190 lb 0.6 oz), SpO2 99 %. Ct Head Wo Contrast  Result Date: 01/30/2017 CLINICAL DATA:  Routine followup for right posterior communicating artery aneurysm clipping. EXAM: CT HEAD WITHOUT CONTRAST TECHNIQUE: Contiguous axial images were obtained from the base of the skull through the vertex without intravenous contrast. COMPARISON:  MRI 12/30/2016 pan prior head CT 12/25/2016 FINDINGS: Brain: The ventricles are in the midline without mass effect or shift. They are normal in size and configuration. No extra-axial fluid collections are identified. Significant artifact from aneurysm cord rales in the region of the right posterior communicating artery. No complicating features are demonstrated. There are remote lacunar type infarcts noted in the posterior limb of the internal capsule and also in the right basal ganglia. No CT findings for acute hemispheric infarction or intracranial hemorrhage. The brainstem and cerebellum are grossly normal. Vascular: No hyperdense vessel or unexpected calcification. Skull: Normal. Negative for fracture or focal lesion. Fairly extensive hyperostosis frontalis interna noted for age. Sinuses/Orbits: Beam left maxillary sinus demonstrates chronic disease. The other paranasal sinuses are clear. The mastoid air cells and middle ear cavities are clear. Other: No scalp lesions or hematoma. IMPRESSION: 1. 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 No new/acute intracranial findings. 2. 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 3.  Persistent/chronic left maxillary sinus disease. 4. Hyperostosis frontalis interna Electronically Signed   By: Marijo Sanes M.D.   On: 01/30/2017 15:29   No results for input(s): WBC, HGB, HCT, PLT in the last 72 hours. No results for input(s): NA, K, CL, GLUCOSE, BUN, CREATININE, CALCIUM in the last 72 hours.  Invalid input(s): CO CBG (last 3)   Recent Labs  01/30/17 1617 01/30/17 2113 01/31/17 0648  GLUCAP 124* 103* 103*    Wt Readings from Last 3 Encounters:  01/29/17 86.2 kg (190 lb 0.6 oz)  01/07/17 88.9 kg (196 lb)    Physical Exam:  Constitutional: She appears well-developed. NAD. HENT: Normocephalic and atraumatic.  Eyes: EOMI. No discharge.  Cardiovascular: RRR without murmur. No JVD  Respiratory: CTA Bilaterally without wheezes or rales. Normal effort  GI: soft, BS+  Musculoskeletal: She exhibits no edema and mild calf tenderness persists on left, Calves are soft bilaterally. Negative Homans, negative Thompson's test , mild fullness, left popliteal fossa  Neurological: Alert and oriented , Motor: RUE:5/5  LUE: 5/5 RLE: 4/5 right hip flexion, KE, 4/5 ADF/PF LLE:  4/5 proximal to 4/5 distal  Skin: Skin is warm and dry. 1 plus ankle edema bilaterally  Psychiatric: She has a normal mood and affect. Her behavior is normal.   Assessment/Plan: 1. 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 Physiatrist is providing close team supervision and 24 hour management of active medical problems listed below. Physiatrist and rehab team continue to assess barriers to discharge/monitor patient progress toward functional and medical goals.  Function:  Bathing Bathing position   Position: Shower  Bathing parts Body parts bathed by patient: Right arm, Left arm, Chest, Abdomen, Front perineal area, Buttocks,  Right upper leg, Left  upper leg, Right lower leg, Left lower leg Body parts bathed by helper: Buttocks  Bathing assist Assist Level: Supervision or verbal cues      Upper Body Dressing/Undressing Upper body dressing   What is the patient wearing?: Bra, Pull over shirt/dress Bra - Perfomed by patient: Thread/unthread right bra strap, Thread/unthread left bra strap Bra - Perfomed by helper: Hook/unhook bra (pull down sports bra) Pull over shirt/dress - Perfomed by patient: Thread/unthread right sleeve, Thread/unthread left sleeve, Put head through opening, Pull shirt over trunk Pull over shirt/dress - Perfomed by helper: Pull shirt over trunk        Upper body assist Assist Level: Touching or steadying assistance(Pt > 75%)   Set up : To obtain clothing/put away  Lower Body Dressing/Undressing Lower body dressing   What is the patient wearing?: Underwear, Pants, Non-skid slipper socks Underwear - Performed by patient: Thread/unthread right underwear leg, Pull underwear up/down, Thread/unthread left underwear leg Underwear - Performed by helper: Thread/unthread left underwear leg Pants- Performed by patient: Thread/unthread right pants leg, Thread/unthread left pants leg, Pull pants up/down Pants- Performed by helper: Thread/unthread left pants leg   Non-skid slipper socks- Performed by helper: Don/doff right sock, Don/doff left sock Socks - Performed by patient: Don/doff right sock, Don/doff left sock Socks - Performed by helper: Don/doff right sock Shoes - Performed by patient: Don/doff right shoe, Fasten right, Fasten left (with short stool) Shoes - Performed by helper: Don/doff left shoe       TED Hose - Performed by helper: Don/doff right TED hose, Don/doff left TED hose  Lower body assist Assist for lower body dressing: Touching or steadying assistance (Pt > 75%)      Toileting Toileting Toileting activity did not occur: N/A Toileting steps completed by patient: Adjust clothing prior to  toileting, Performs perineal hygiene, Adjust clothing after toileting Toileting steps completed by helper: Adjust clothing prior to toileting, Performs perineal hygiene, Adjust clothing after toileting Toileting Assistive Devices: Other (comment) (RW)  Toileting assist Assist level: Supervision or verbal cues   Transfers Chair/bed transfer   Chair/bed transfer method: Stand pivot Chair/bed transfer assist level: Touching or steadying assistance (Pt > 75%) Chair/bed transfer assistive device:  (no AD) Mechanical lift: Stedy   Locomotion Ambulation Ambulation activity did not occur: Safety/medical concerns (quadriplegia)   Max distance: 59ft Assist level: Touching or steadying assistance (Pt > 75%)   Wheelchair   Type:  (TBD) Max wheelchair distance: 178ft Assist Level: Supervision or verbal cues  Cognition Comprehension Comprehension assist level: Follows complex conversation/direction with extra time/assistive device  Expression Expression assist level: Expresses complex 90% of the time/cues < 10% of the time  Social Interaction Social Interaction assist level: Interacts appropriately with others - No medications needed.  Problem Solving Problem solving assist level: Solves complex problems: Recognizes & self-corrects  Memory Memory assist level: Complete Independence: No helper   Medical Problem List and Plan: 1.  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  DVT Prophylaxis/Anticoagulation:   Left gastroc thrombus---ROM/activity/heat and massage  -f/u doppler unchanged 6/13,Follow-up Doppler 01/30/2017 shows propagation of left calf DVT into the popliteal and femoral veins on the left side. Also, new right tibioperoneal trunk and distal tibial DVT, started on Xarelto 3. Pain Management: Ultram  as needed  -Bilateral  calf strain, improved with BenGay cream 4. Mood: Provide emotional support 5. Neuropsych: This patient is capable of making decisions on her own behalf. 6. Skin/Wound Care: Routine skin checks 7. Fluids/Electrolytes/Nutrition: Routine I&Os  -encourage PO 8. Seizure prophylaxis. Keppra 500 mg every 12 hours, no seizure activity 6/27     9. Diabetes mellitus.  Resumed glucophage 500mg  qd with good control 6/28 CBG (last 3)   Recent Labs  01/30/17 1617 01/30/17 2113 01/31/17 0648  GLUCAP 124* 103* 103*    11. Obesity. BMI 34.7 KG. Dietary follow-up    LOS (Days) 24 A FACE TO FACE EVALUATION WAS PERFORMED  Charlett Blake, MD 01/31/2017 10:53 AM

## 2017-01-31 NOTE — Progress Notes (Signed)
Physical Therapy Session Note  Patient Details  Name: Aimee [name] MRN: [id number] Date of Birth: June 30, 1976  Today's Date: 01/31/2017 PT Individual Time: 1045-1200; 1300-1400 PT Individual Time Calculation (min): 75 min; 60 min   Short Term Goals: Week 4:  PT Short Term Goal 1 (Week 4): = LTGs due to ELOS.  Skilled Therapeutic Interventions/Progress Updates:  Tx 1: Pt received supine in bed; reports LLE pain but agreeable to tx. S for bed mobility and setup assist to don shoes. Pt ambulated to restroom and used bathroom with S. Total A to ortho gym. Pt ambulated with Lite Gait x2 trials; 1109f in 3 min and 190 ft in 4.5 min at a slower pace with focus on larger steps. Pt given verbal cues for step length and decreased UE support; metronome used during second trial to cue step time and promote larger steps. Pt reports decreased LLE pain during ambulation but increased pain with static standing. BP measured to be 106/75 with HR 91 after second ambulation trial using Lite Gait. Pt ambulated 3324fwith S and no AD with cues for L weight shifting initially. After a rest break, pt ambulated approximately 24f61fith S and no AD but unable to maintain balance during cognitive dual task when attempting to speak to another person in hallway, requiring +2 A to correct for lob. Pt total A to return to room. Close S stand pivot transfer from w/c>bed with no AD. Pt performed 1x10 SLR on L LE and 1x10 sidelying hip abduction with LLE. Pt left supine in bed with all needs met.   Tx 2: Pt received supine in bed. Pt ambulated to restroom and performed toileting with S. Pt total A to rehab gym. Pt ambulated >200f70fth S using RW while intermittently performing horizontal head turns. Pt ascended/descended ramp using RW with S and ambulated 10ft67funeven surface with RW and S. Pt min A sit>stand from low recliner in rehab apartment using RW; ambulated 50ft 19feturn to gym with S. Pt performed 2x10 crunches from  elevated wedge, 2x10 prone hip extension each LE. Pt performed stand pivot transfer from mat>w/c and w/c>bed with min guard due to fatigue. Pt left supine in bed with all needs met.   Therapy Documentation Precautions:  Precautions Precautions: Fall, Other (comment) Precaution Comments: quadriplegia (LUE/LLE more impaired) PRAFOs Restrictions Weight Bearing Restrictions: No RLE Weight Bearing: Weight bearing as tolerated LLE Weight Bearing: Weight bearing as tolerated   See Function Navigator for Current Functional Status.   Therapy/Group: Individual Therapy  CaroliAlysia Penna2018, 2:58 PM

## 2017-01-31 NOTE — Progress Notes (Signed)
Physical Therapy Session Note  Patient Details  Name: Aimee [name] MRN: [id number] Date of Birth: 29-Dec-1975  Today's Date: 01/31/2017 PT Individual Time: 1005-1030 PT Individual Time Calculation (min): 25 min   Short Term Goals: Week 3:  PT Short Term Goal 1 (Week 3): Pt will consistently perform sit>stand with UE support and min A.  PT Short Term Goal 2 (Week 3): Pt will ambulate 156ft with LRAD and min guard.  PT Short Term Goal 3 (Week 3): Pt will perform bed mobility with S from without use of bedrails.  PT Short Term Goal 4 (Week 3): Pt will perform stand pivot transfer with LRAD and min guard.   Skilled Therapeutic Interventions/Progress Updates:  PA Linna Hoff) cleared pt for participation in tx. Pt received in bed & agreeable to tx, noting 7/10 pain in LLE & pain meds requested. Pt performed BLE strengthening exercises in supine position. Pt performed heel slides, short arc quads, hip adduction pillow squeezes, hip abduction slides, and straight leg raises with therapist providing cuing for proper technique for all exercises. At end of session pt left in bed with all needs within reach.  Therapy Documentation Precautions:  Precautions Precautions: Fall, Other (comment) Precaution Comments: quadriplegia (LUE/LLE more impaired) PRAFOs Restrictions Weight Bearing Restrictions: No RLE Weight Bearing: Weight bearing as tolerated LLE Weight Bearing: Weight bearing as tolerated    See Function Navigator for Current Functional Status.   Therapy/Group: Individual Therapy  Waunita Schooner 01/31/2017, 10:53 AM

## 2017-02-01 ENCOUNTER — Inpatient Hospital Stay (HOSPITAL_COMMUNITY): Payer: Self-pay

## 2017-02-01 ENCOUNTER — Ambulatory Visit (HOSPITAL_COMMUNITY): Payer: Self-pay | Admitting: Physical Therapy

## 2017-02-01 ENCOUNTER — Encounter (HOSPITAL_COMMUNITY): Payer: Self-pay

## 2017-02-01 DIAGNOSIS — I82412 Acute embolism and thrombosis of left femoral vein: Secondary | ICD-10-CM

## 2017-02-01 LAB — GLUCOSE, CAPILLARY
Glucose-Capillary: 91 mg/dL (ref 65–99)
Glucose-Capillary: 105 mg/dL — ABNORMAL HIGH (ref 65–99)
Glucose-Capillary: 109 mg/dL — ABNORMAL HIGH (ref 65–99)
Glucose-Capillary: 106 mg/dL — ABNORMAL HIGH (ref 65–99)

## 2017-02-01 NOTE — Progress Notes (Signed)
Physical Therapy Session Note  Patient Details  Name: Aimee [name] Plan MRN: [id number] Date of Birth: 26-Dec-1975  Today's Date: 02/01/2017 PT Individual Time: 1100-1200 PT Individual Time Calculation (min): 60 min   Short Term Goals: Week 4:  PT Short Term Goal 1 (Week 4): = LTGs due to ELOS.  Skilled Therapeutic Interventions/Progress Updates:  Pt received seated in manual w/c with brother, [name], present for family education. Pt reports that her LLE is feeling better compared to yesterday. Pt ambulated to restroom with S with RW but required min A to perform sit>stand from regular toilet. Total A to ortho gym in w/c. Pt and pt's brother educated on car transfer technique; pt performed car transfer with S using RW. Pt ambulated approximately 157f to rehab apartment to practice sit>stand from low chair. Educated pt's brother on technique to provide assistance, however, after several attempts, pt and pt's brother unable to successfully perform sit>stand from very low chair; pt required max A from student PT for sit>stand using RW. Pt and pt's brother educated on assistance required for curb step negotiation; pt performed curb step using RW with min guard/S x2 with verbal cues for technique. Pt educated on floor transfer. Pt transitioned from mat>floor with min A to help guide pt to floor and verbal cues for sequencing; mod A for transfer from floor>mat for initial boost from tall kneeling to standing with UE support on mat. Pt S for stand pivot transfer using RW from mat>w/c. Total A to return to pt's room where she remained seated in w/c with LEs elevated and all needs met.   Therapy Documentation Precautions:  Precautions Precautions: Fall, Other (comment) Precaution Comments: quadriplegia (LUE/LLE more impaired) PRAFOs Restrictions Weight Bearing Restrictions: No RLE Weight Bearing: Weight bearing as tolerated LLE Weight Bearing: Weight bearing as tolerated   See Function Navigator for  Current Functional Status.   Therapy/Group: Individual Therapy  CAlysia Penna6/29/2018, 12:23 PM

## 2017-02-01 NOTE — Progress Notes (Signed)
[place] PHYSICAL MEDICINE & REHABILITATION     PROGRESS NOTE    Subjective/Complaints: No issues overnite, less pain and swelling in LLE  ROS: pt denies nausea, vomiting, diarrhea, cough, shortness of breath or chest pain   Objective: Vital Signs: Blood pressure 100/61, pulse 73, temperature 98.4 F (36.9 C), temperature source Oral, resp. rate 18, weight 86.2 kg (190 lb 0.6 oz), SpO2 99 %. No results found. No results for input(s): WBC, HGB, HCT, PLT in the last 72 hours. No results for input(s): NA, K, CL, GLUCOSE, BUN, CREATININE, CALCIUM in the last 72 hours.  Invalid input(s): CO CBG (last 3)   Recent Labs  01/31/17 2139 02/01/17 0622 02/01/17 1209  GLUCAP 107* 91 105*    Wt Readings from Last 3 Encounters:  01/29/17 86.2 kg (190 lb 0.6 oz)  01/07/17 88.9 kg (196 lb)    Physical Exam:  Constitutional: She appears well-developed. NAD. HENT: Normocephalic and atraumatic.  Eyes: EOMI. No discharge.  Cardiovascular: RRR without murmur. No JVD  Respiratory: CTA Bilaterally without wheezes or rales. Normal effort  GI: soft, BS+  Musculoskeletal: She exhibits no edema and mild calf tenderness persists on left, Calves are soft bilaterally. Negative Homans, negative Thompson's test , mild fullness, left popliteal fossa  Neurological: Alert and oriented , Motor: RUE:5/5  LUE: 5/5 RLE: 4/5 right hip flexion, KE, 4/5 ADF/PF LLE:  4/5 proximal to 4/5 distal  Skin: Skin is warm and dry. 1 plus ankle edema bilaterally  Psychiatric: She has a normal mood and affect. Her behavior is normal.   Assessment/Plan: 1. 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 Physiatrist is providing close team supervision and 24 hour management of active medical problems listed below. Physiatrist and rehab team continue to assess barriers to  discharge/monitor patient progress toward functional and medical goals.  Function:  Bathing Bathing position   Position: Shower  Bathing parts Body parts bathed by patient: Right arm, Left arm, Chest, Abdomen, Front perineal area, Buttocks, Right upper leg, Left upper leg, Right lower leg, Left lower leg Body parts bathed by helper: Buttocks  Bathing assist Assist Level: Supervision or verbal cues      Upper Body Dressing/Undressing Upper body dressing   What is the patient wearing?: Bra, Pull over shirt/dress Bra - Perfomed by patient: Thread/unthread right bra strap, Thread/unthread left bra strap Bra - Perfomed by helper: Hook/unhook bra (pull down sports bra) Pull over shirt/dress - Perfomed by patient: Thread/unthread right sleeve, Thread/unthread left sleeve, Put head through opening, Pull shirt over trunk Pull over shirt/dress - Perfomed by helper: Pull shirt over trunk        Upper body assist Assist Level: Touching or steadying assistance(Pt > 75%)   Set up : To obtain clothing/put away  Lower Body Dressing/Undressing Lower body dressing   What is the patient wearing?: Underwear, Pants, Non-skid slipper socks Underwear - Performed by patient: Thread/unthread right underwear leg, Pull underwear up/down, Thread/unthread left underwear leg Underwear - Performed by helper: Thread/unthread left underwear leg Pants- Performed by patient: Thread/unthread right pants leg, Thread/unthread left pants leg, Pull pants up/down Pants- Performed by helper: Thread/unthread left pants leg   Non-skid slipper socks- Performed by helper: Don/doff right sock, Don/doff left sock Socks - Performed by patient: Don/doff right sock, Don/doff left sock Socks - Performed by helper: Don/doff right sock Shoes - Performed by patient: Don/doff right shoe, [employer]  right, Fasten left (with short stool) Shoes - Performed by helper: Don/doff left shoe       TED Hose - Performed by helper: Don/doff right  TED hose, Don/doff left TED hose  Lower body assist Assist for lower body dressing: Supervision or verbal cues      Toileting Toileting Toileting activity did not occur: N/A Toileting steps completed by patient: Adjust clothing prior to toileting, Performs perineal hygiene, Adjust clothing after toileting Toileting steps completed by helper: Adjust clothing prior to toileting, Performs perineal hygiene, Adjust clothing after toileting Toileting Assistive Devices: Other (comment) (RW)  Toileting assist Assist level: Supervision or verbal cues   Transfers Chair/bed transfer   Chair/bed transfer method: Stand pivot, Ambulatory Chair/bed transfer assist level: Supervision or verbal cues Chair/bed transfer assistive device: Armrests, Walker Mechanical lift: Ecologist Ambulation activity did not occur: Safety/medical concerns (quadriplegia)   Max distance: 12ft Assist level: Supervision or verbal cues   Wheelchair   Type:  (TBD) Max wheelchair distance: 128ft Assist Level: Supervision or verbal cues  Cognition Comprehension Comprehension assist level: Follows complex conversation/direction with extra time/assistive device  Expression Expression assist level: Expresses complex 90% of the time/cues < 10% of the time  Social Interaction Social Interaction assist level: Interacts appropriately with others - No medications needed.  Problem Solving Problem solving assist level: Solves complex problems: Recognizes & self-corrects  Memory Memory assist level: Complete Independence: No helper   Medical Problem List and Plan: 1.  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 Also, new right tibioperoneal trunk and distal tibial DVT, started on Xarelto 3. Pain Management: Ultram as needed  -Bilateral calf strain, improved with BenGay cream 4. Mood: Provide emotional support 5. Neuropsych: This patient is capable of making decisions on her own behalf. 6. Skin/Wound Care: Routine skin checks 7. Fluids/Electrolytes/Nutrition: Routine I&Os  -encourage PO 8. Seizure prophylaxis. Keppra 500 mg every 12 hours, no seizure activity 6/27     9. Diabetes mellitus.  Resumed glucophage 500mg  qd with good control 6/28 CBG (last 3)   Recent Labs  01/31/17 2139 02/01/17 0622 02/01/17 1209  GLUCAP 107* 91 105*    11. Obesity. BMI 34.7 KG. Dietary follow-up    LOS (Days) 25 A FACE TO FACE EVALUATION WAS PERFORMED  Charlett Blake, MD 02/01/2017 3:47 PM

## 2017-02-01 NOTE — Progress Notes (Signed)
Occupational Therapy Note  Patient Details  Name: Aimee [name] MRN: [id number] Date of Birth: 1976-06-09  Today's Date: 02/01/2017 OT Individual Time: 1330-1400 OT Individual Time Calculation (min): 30 min   Pt c/o L hamstring pain; Muscle rub applies, repositioned Individual Therapy  Pt resting in w/c upon arrival with c/o LLE (hamstring) pain following PT session.  Focus on BUE therex with Theraband.  Pt instructed on BUE exercises with return demonstration.  Pt transferred to bed and remained in bed with all needs within reach.    [name] [facility] 02/01/2017, 2:01 PM

## 2017-02-01 NOTE — Progress Notes (Addendum)
Occupational Therapy Note  Patient Details  Name: Aimee [name] MRN: [id number] Date of Birth: [birth year]  Today's Date: 02/01/2017 OT Individual Time: 1000-1100 OT Individual Time Calculation (min): 60 min   Pt denies pain Individual Therapy  Pt resting in w/c upon arrival with brother present.  Pt propelled w/c to ADL apartment and practiced tub bench transfers, bed transfers, kitchen safety, and RW safety. Recommended 24 hour supervision.  Pt and brother verbalized understanding.  Discussed need for [facility] and pt stated she would prefer to have one especially when first at home.  Discussed bathroom safety.  Pt returned to room and remained in w/c with brother present.  Pt issued long handle shoe horn for use with donning shoes and assisting with pulling down bra strap on sports bra.  [name] brother verbalized understanding of all recommendations.     [name] [facility] 02/01/2017, 11:17 AM

## 2017-02-01 NOTE — Progress Notes (Signed)
Occupational Therapy Session Note  Patient Details  Name: Aimee [name] MRN: [id number] Date of Birth: [birth year]  Today's Date: 02/01/2017 OT Individual Time: 0700-0759 OT Individual Time Calculation (min): 59 min    Short Term Goals: Week 3:  OT Short Term Goal 1 (Week 3): Pt will perform UB dressing tasks with supervision OT Short Term Goal 2 (Week 3): Pt will complete LB dressing tasks with supervision OT Short Term Goal 3 (Week 3): Pt will complete shower transfer with supervisoin OT Short Term Goal 4 (Week 3): Pt will complete toilet transfer with supervisoin  Skilled Therapeutic Interventions/Progress Updates:    Pt sitting on toilet upon arrival.  Pt amb with RW to shower seat and completed bathing tasks at supervision level.  Pt returned to room and selected clothing prior to completing dressing tasks with sit<>stand from EOB.  Pt required assistance with pulling down her bra in back.  Pt amb to sink and completed grooming tasks while standing at sink. Pt remained in w/c with all needs within reach.  Focus on functional amb with RW, standing balance, BUE use for functional tasks, and safety awareness to increase independence with BADLs.   Therapy Documentation Precautions:  Precautions Precautions: Fall, Other (comment) Precaution Comments: quadriplegia (LUE/LLE more impaired) PRAFOs Restrictions Weight Bearing Restrictions: No RLE Weight Bearing: Weight bearing as tolerated LLE Weight Bearing: Weight bearing as tolerated Pain:  Pt denies pain  See Function Navigator for Current Functional Status.   Therapy/Group: Individual Therapy  Leroy Libman 02/01/2017, 8:03 AM

## 2017-02-02 DIAGNOSIS — I82493 Acute embolism and thrombosis of other specified deep vein of lower extremity, bilateral: Secondary | ICD-10-CM

## 2017-02-02 DIAGNOSIS — I82409 Acute embolism and thrombosis of unspecified deep veins of unspecified lower extremity: Secondary | ICD-10-CM

## 2017-02-02 LAB — GLUCOSE, CAPILLARY
Glucose-Capillary: 101 mg/dL — ABNORMAL HIGH (ref 65–99)
Glucose-Capillary: 88 mg/dL (ref 65–99)
Glucose-Capillary: 108 mg/dL — ABNORMAL HIGH (ref 65–99)
Glucose-Capillary: 108 mg/dL — ABNORMAL HIGH (ref 65–99)

## 2017-02-02 LAB — CREATININE, SERUM
Creatinine, Ser: 0.48 mg/dL (ref 0.44–1.00)
GFR calc Af Amer: >60 mL/min (ref >60)
GFR calc non Af Amer: >60 mL/min (ref >60)

## 2017-02-02 LAB — CBC
HCT: 33.2 % — ABNORMAL LOW (ref 36.0–46.0)
Hemoglobin: 10.6 g/dL — ABNORMAL LOW (ref 12.0–15.0)
MCH: 27.9 pg (ref 26.0–34.0)
MCHC: 31.9 g/dL (ref 30.0–36.0)
MCV: 87.4 fL (ref 78.0–100.0)
Platelets: 290 10*3/uL (ref 150–400)
RBC: 3.8 MIL/uL — ABNORMAL LOW (ref 3.87–5.11)
RDW: 16.4 % — ABNORMAL HIGH (ref 11.5–15.5)
WBC: 6.4 10*3/uL (ref 4.0–10.5)

## 2017-02-02 NOTE — Progress Notes (Signed)
[place] PHYSICAL MEDICINE & REHABILITATION     PROGRESS NOTE    Subjective/Complaints: Pt seen laying in bed this AM.  She slept well overnight and recalls me.    ROS: Denies nausea, vomiting, diarrhea,  shortness of breath or chest pain  Objective: Vital Signs: Blood pressure 113/71, pulse 63, temperature 98.1 F (36.7 C), temperature source Oral, resp. rate 12, weight 86.2 kg (190 lb 0.6 oz), SpO2 98 %. No results found.  Recent Labs  02/02/17 0409  WBC 6.4  HGB 10.6*  HCT 33.2*  PLT 290    Recent Labs  02/02/17 0409  CREATININE 0.48   CBG (last 3)   Recent Labs  02/01/17 1638 02/01/17 2121 02/02/17 0823  GLUCAP 109* 106* 101*    Wt Readings from Last 3 Encounters:  01/29/17 86.2 kg (190 lb 0.6 oz)  01/07/17 88.9 kg (196 lb)    Physical Exam:  Constitutional: She appears well-developed. NAD. HENT: Normocephalic and atraumatic.  Eyes: EOMI. No discharge.  Cardiovascular: RRR without murmur. No JVD  Respiratory: CTA Bilaterally. Normal effort  GI: soft, BS+  Musculoskeletal:  She exhibits no edema, mild calf tenderness on left Neurological: Alert and oriented Motor: RUE:5/5  LUE: 5/5  RLE: 4/5 right hip flexion, KE, 4+/5 ADF/PF LLE:  4/5 proximal to 4+/5 distal Skin: Skin is warm and dry. Intact Psychiatric: She has a normal mood and affect. Her behavior is normal.   Assessment/Plan: 1. 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 Physiatrist is providing close team supervision and 24 hour management of active medical problems listed below. Physiatrist and rehab team continue to assess barriers to discharge/monitor patient progress toward functional and medical goals.  Function:  Bathing Bathing position   Position: Shower  Bathing parts Body parts bathed by patient: Right arm, Left arm, Chest,  Abdomen, Front perineal area, Buttocks, Right upper leg, Left upper leg, Right lower leg, Left lower leg Body parts bathed by helper: Buttocks  Bathing assist Assist Level: Supervision or verbal cues      Upper Body Dressing/Undressing Upper body dressing   What is the patient wearing?: Bra, Pull over shirt/dress Bra - Perfomed by patient: Thread/unthread right bra strap, Thread/unthread left bra strap Bra - Perfomed by helper: Hook/unhook bra (pull down sports bra) Pull over shirt/dress - Perfomed by patient: Thread/unthread right sleeve, Thread/unthread left sleeve, Put head through opening, Pull shirt over trunk Pull over shirt/dress - Perfomed by helper: Pull shirt over trunk        Upper body assist Assist Level: Touching or steadying assistance(Pt > 75%)   Set up : To obtain clothing/put away  Lower Body Dressing/Undressing Lower body dressing   What is the patient wearing?: Underwear, Pants, Non-skid slipper socks Underwear - Performed by patient: Thread/unthread right underwear leg, Pull underwear up/down, Thread/unthread left underwear leg Underwear - Performed by helper: Thread/unthread left underwear leg Pants- Performed by patient: Thread/unthread right pants leg, Thread/unthread left pants leg, Pull pants up/down Pants- Performed by helper: Thread/unthread left pants leg   Non-skid slipper socks- Performed by helper: Don/doff right sock, Don/doff left sock Socks - Performed by patient: Don/doff right sock, Don/doff left sock Socks - Performed by helper: Don/doff right sock Shoes - Performed by patient: Don/doff right shoe, Fasten right, Fasten left (with short stool) Shoes - Performed by helper: Don/doff left shoe       TED Hose -  Performed by helper: Don/doff right TED hose, Don/doff left TED hose  Lower body assist Assist for lower body dressing: Supervision or verbal cues      Toileting Toileting Toileting activity did not occur: N/A Toileting steps completed  by patient: Adjust clothing prior to toileting, Performs perineal hygiene, Adjust clothing after toileting Toileting steps completed by helper: Adjust clothing prior to toileting, Performs perineal hygiene, Adjust clothing after toileting Toileting Assistive Devices: Other (comment) (RW)  Toileting assist Assist level: Supervision or verbal cues   Transfers Chair/bed transfer   Chair/bed transfer method: Stand pivot, Ambulatory Chair/bed transfer assist level: Supervision or verbal cues Chair/bed transfer assistive device: Armrests, Walker Mechanical lift: Ecologist Ambulation activity did not occur: Safety/medical concerns (quadriplegia)   Max distance: 164ft Assist level: Supervision or verbal cues   Wheelchair   Type:  (TBD) Max wheelchair distance: 183ft Assist Level: Supervision or verbal cues  Cognition Comprehension Comprehension assist level: Follows complex conversation/direction with no assist  Expression Expression assist level: Expresses complex 90% of the time/cues < 10% of the time  Social Interaction Social Interaction assist level: Interacts appropriately with others - No medications needed.  Problem Solving Problem solving assist level: Solves complex problems: Recognizes & self-corrects  Memory Memory assist level: Complete Independence: No helper   Medical Problem List and Plan: 1.  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 Also, new right tibioperoneal trunk and distal tibial DVT, started on Xarelto  3. Pain Management: Ultram as needed  -Bilateral calf strain, improved with BenGay cream 4. Mood: Provide emotional support 5. Neuropsych:  This patient is capable of making decisions on her own behalf. 6. Skin/Wound Care: Routine skin checks 7. Fluids/Electrolytes/Nutrition: Routine I&Os  -encourage PO 8. Seizure prophylaxis. Keppra 500 mg every 12 hours, no seizure activity 6/27 9. Diabetes mellitus.  Resumed glucophage 500mg  qd  Controlled 6/30 CBG (last 3)   Recent Labs  02/01/17 1638 02/01/17 2121 02/02/17 0823  GLUCAP 109* 106* 101*   11. Obesity. BMI 34.7 KG. Dietary follow-up 12. ABLA  Hb 10.6 on 6/30  Cont to monitor  LOS (Days) 26 A FACE TO FACE EVALUATION WAS PERFORMED  Sondi Desch Lorie Phenix, MD 02/02/2017 10:30 AM

## 2017-02-03 ENCOUNTER — Inpatient Hospital Stay (HOSPITAL_COMMUNITY): Payer: Self-pay

## 2017-02-03 LAB — GLUCOSE, CAPILLARY
Glucose-Capillary: 97 mg/dL (ref 65–99)
Glucose-Capillary: 82 mg/dL (ref 65–99)
Glucose-Capillary: 83 mg/dL (ref 65–99)
Glucose-Capillary: 96 mg/dL (ref 65–99)

## 2017-02-03 NOTE — Progress Notes (Signed)
[place] PHYSICAL MEDICINE & REHABILITATION     PROGRESS NOTE    Subjective/Complaints: Seen lying in bed this morning. She states he slept well overnight. She denies complaints.  ROS: Denies nausea, vomiting, diarrhea, shortness of breath or chest pain  Objective: Vital Signs: Blood pressure (!) 98/50, pulse 76, temperature 98.2 F (36.8 C), temperature source Oral, resp. rate 18, weight 86.2 kg (190 lb 0.6 oz), SpO2 99 %. No results found.  Recent Labs  02/02/17 0409  WBC 6.4  HGB 10.6*  HCT 33.2*  PLT 290    Recent Labs  02/02/17 0409  CREATININE 0.48   CBG (last 3)   Recent Labs  02/02/17 1623 02/02/17 2112 02/03/17 0604  GLUCAP 108* 108* 97    Wt Readings from Last 3 Encounters:  01/29/17 86.2 kg (190 lb 0.6 oz)  01/07/17 88.9 kg (196 lb)    Physical Exam:  Constitutional: She appears well-developed. NAD. HENT: Normocephalic and atraumatic.  Eyes: EOMI. No discharge.  Cardiovascular: RRR. No JVD  Respiratory: CTA Bilaterally. Normal effort  GI: soft, BS+  Musculoskeletal:  She exhibits no edema, mild calf tenderness on left Neurological: Alert and oriented Motor: RUE:5/5  LUE: 5/5  RLE: 4/5 right hip flexion, KE, 4+/5 ADF/PF LLE:  4/5 proximal to 4+/5 distal Skin: Skin is warm and dry. Intact Psychiatric: She has a normal mood and affect. Her behavior is normal.   Assessment/Plan: 1. 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 Physiatrist is providing close team supervision and 24 hour management of active medical problems listed below. Physiatrist and rehab team continue to assess barriers to discharge/monitor patient progress toward functional and medical goals.  Function:  Bathing Bathing position   Position: Shower  Bathing parts Body parts bathed by patient: Right arm, Left arm, Chest,  Abdomen, Front perineal area, Buttocks, Right upper leg, Left upper leg, Right lower leg, Left lower leg Body parts bathed by helper: Buttocks  Bathing assist Assist Level: Supervision or verbal cues      Upper Body Dressing/Undressing Upper body dressing   What is the patient wearing?: Bra, Pull over shirt/dress Bra - Perfomed by patient: Thread/unthread right bra strap, Thread/unthread left bra strap Bra - Perfomed by helper: Hook/unhook bra (pull down sports bra) Pull over shirt/dress - Perfomed by patient: Thread/unthread right sleeve, Thread/unthread left sleeve, Put head through opening, Pull shirt over trunk Pull over shirt/dress - Perfomed by helper: Pull shirt over trunk        Upper body assist Assist Level: Touching or steadying assistance(Pt > 75%)   Set up : To obtain clothing/put away  Lower Body Dressing/Undressing Lower body dressing   What is the patient wearing?: Underwear, Pants, Non-skid slipper socks Underwear - Performed by patient: Thread/unthread right underwear leg, Pull underwear up/down, Thread/unthread left underwear leg Underwear - Performed by helper: Thread/unthread left underwear leg Pants- Performed by patient: Thread/unthread right pants leg, Thread/unthread left pants leg, Pull pants up/down Pants- Performed by helper: Thread/unthread left pants leg   Non-skid slipper socks- Performed by helper: Don/doff right sock, Don/doff left sock Socks - Performed by patient: Don/doff right sock, Don/doff left sock Socks - Performed by helper: Don/doff right sock Shoes - Performed by patient: Don/doff right shoe, Fasten right, Fasten left (with short stool) Shoes - Performed by helper: Don/doff left shoe       TED Hose - Performed by helper: Don/doff  right TED hose, Don/doff left TED hose  Lower body assist Assist for lower body dressing: Supervision or verbal cues      Toileting Toileting Toileting activity did not occur: N/A Toileting steps completed  by patient: Adjust clothing prior to toileting, Performs perineal hygiene, Adjust clothing after toileting Toileting steps completed by helper: Adjust clothing prior to toileting, Performs perineal hygiene, Adjust clothing after toileting Toileting Assistive Devices: Other (comment) (RW)  Toileting assist Assist level: Supervision or verbal cues   Transfers Chair/bed transfer   Chair/bed transfer method: Stand pivot, Ambulatory Chair/bed transfer assist level: Supervision or verbal cues Chair/bed transfer assistive device: Armrests, Walker Mechanical lift: Ecologist Ambulation activity did not occur: Safety/medical concerns (quadriplegia)   Max distance: 170ft Assist level: Supervision or verbal cues   Wheelchair   Type:  (TBD) Max wheelchair distance: 146ft Assist Level: Supervision or verbal cues  Cognition Comprehension Comprehension assist level: Follows complex conversation/direction with no assist  Expression Expression assist level: Expresses complex 90% of the time/cues < 10% of the time  Social Interaction Social Interaction assist level: Interacts appropriately with others - No medications needed.  Problem Solving Problem solving assist level: Solves complex problems: Recognizes & self-corrects  Memory Memory assist level: Complete Independence: No helper   Medical Problem List and Plan: 1.  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 Also, new right tibioperoneal trunk and distal tibial DVT, started on Xarelto  3. Pain Management: Ultram as needed  Bilateral calf strain, improved with BenGay cream 4. Mood: Provide emotional  support 5. Neuropsych: This patient is capable of making decisions on her own behalf. 6. Skin/Wound Care: Routine skin checks 7. Fluids/Electrolytes/Nutrition: Routine I&Os  -encourage PO 8. Seizure prophylaxis. Keppra 500 mg every 12 hours, no seizure activity  9. Diabetes mellitus.  Resumed glucophage 500mg  qd  Controlled 7/1 CBG (last 3)   Recent Labs  02/02/17 1623 02/02/17 2112 02/03/17 0604  GLUCAP 108* 108* 97   11. Obesity. BMI 34.7 KG. Dietary follow-up 12. ABLA  Hb 10.6 on 6/30  Cont to monitor  LOS (Days) 27 A FACE TO FACE EVALUATION WAS PERFORMED  Ankit Lorie Phenix, MD 02/03/2017 9:51 AM

## 2017-02-03 NOTE — Progress Notes (Signed)
Occupational Therapy Session Note  Patient Details  Name: Aimee [name] MRN: [id number] Date of Birth: 1976-06-09  Today's Date: 02/03/2017 OT Individual Time: 1030-1100 OT Individual Time Calculation (min): 30 min    Skilled Therapeutic Interventions/Progress Updates:    nopain reported. Pt ambulates to gather clothing with supervision to gather clothes and enter bathroom. Pt completes toileting with supervision. Pt transfers onto TTB with supervision to bathe with set up at sit to stand level as OT inspects w/c. Pt reporting difficult to propel L wheel. OT finds wheel loose but 2/2 time constraints cannot repair at this time. Pt dons UB/LB clothing seated EOB with supervision. Pt demo difficulty pulling sports bra down in back, and OT gives instructional cues to hook long handled shoe horn to pull sports bra down in back. Exited session with pt seated upright in bed donning ted hose.   Therapy Documentation Precautions:  Precautions Precautions: Fall, Other (comment) Precaution Comments: quadriplegia (LUE/LLE more impaired) PRAFOs Restrictions Weight Bearing Restrictions: No RLE Weight Bearing: Weight bearing as tolerated LLE Weight Bearing: Weight bearing as tolerated  See Function Navigator for Current Functional Status.   Therapy/Group: Individual Therapy  Tonny Branch 02/03/2017, 10:48 AM

## 2017-02-04 ENCOUNTER — Inpatient Hospital Stay (HOSPITAL_COMMUNITY): Payer: Self-pay

## 2017-02-04 ENCOUNTER — Inpatient Hospital Stay (HOSPITAL_COMMUNITY): Payer: Self-pay | Admitting: Occupational Therapy

## 2017-02-04 ENCOUNTER — Inpatient Hospital Stay (HOSPITAL_COMMUNITY): Payer: Self-pay | Admitting: Physical Therapy

## 2017-02-04 DIAGNOSIS — D62 Acute posthemorrhagic anemia: Secondary | ICD-10-CM

## 2017-02-04 LAB — GLUCOSE, CAPILLARY
Glucose-Capillary: 92 mg/dL (ref 65–99)
Glucose-Capillary: 132 mg/dL — ABNORMAL HIGH (ref 65–99)
Glucose-Capillary: 96 mg/dL (ref 65–99)
Glucose-Capillary: 92 mg/dL (ref 65–99)

## 2017-02-04 NOTE — Progress Notes (Signed)
Occupational Therapy Session Note  Patient Details  Name: Aimee [name] MRN: [id number] Date of Birth: 1976/05/02  Today's Date: 02/04/2017 OT Individual Time: 0700-0800 OT Individual Time Calculation (min): 60 min    Short Term Goals: Week 3:  OT Short Term Goal 1 (Week 3): Pt will perform UB dressing tasks with supervision OT Short Term Goal 2 (Week 3): Pt will complete LB dressing tasks with supervision OT Short Term Goal 3 (Week 3): Pt will complete shower transfer with supervisoin OT Short Term Goal 4 (Week 3): Pt will complete toilet transfer with supervisoin  Skilled Therapeutic Interventions/Progress Updates:    Pt engaged in BADLs including bathing at shower level, dressing with sit<>stand from EOB, and toileting.  Pt amb with RW to gather clothing and access bathroom for toileting and shower.  Pt returned to room and completed dressing from EOB.  Pt completed all tasks at supervision level, requiring assistance only for donning [facility].  Pt pleased with progress and ready for discharge tomorrow.    Therapy Documentation Precautions:  Precautions Precautions: Fall Precaution Comments: quadriplegia (LUE/LLE more impaired) PRAFOs Restrictions Weight Bearing Restrictions: No RLE Weight Bearing: Weight bearing as tolerated LLE Weight Bearing: Weight bearing as tolerated Pain:  Pt denies pain  See Function Navigator for Current Functional Status.   Therapy/Group: Individual Therapy  Leroy Libman 02/04/2017, 8:05 AM

## 2017-02-04 NOTE — Progress Notes (Signed)
Physical Therapy Session Note  Patient Details  Name: Aimee [name] MRN: [id number] Date of Birth: 1975-09-01  Today's Date: 02/04/2017 PT Individual Time: 1530-1600 PT Individual Time Calculation (min): 30 min   Short Term Goals: Week 4:  PT Short Term Goal 1 (Week 4): = LTGs due to ELOS.  Skilled Therapeutic Interventions/Progress Updates:    Pt sitting in w/c in room upon PT arrival, agreeable to therapy tx. Pt ambulated to day room with RW and supervision. Reviewed and performed HEP including otago exercises- 1x 10 LAQ, 1 x 10 hamstring curls in standing, 1 x 10 hip abduction standing with RW, mini squats with RW for UE support 1 x 10. Pt also worked on balance exercises including tandem and single leg stance with B UE support to no UE support at times. Pt ambulated back to room with RW and supervision, left in w/c with call bell in reach.   Therapy Documentation Precautions:  Precautions Precautions: Fall Precaution Comments: quadriplegia (LUE/LLE more impaired) PRAFOs Restrictions Weight Bearing Restrictions: No RLE Weight Bearing: Weight bearing as tolerated LLE Weight Bearing: Weight bearing as tolerated  See Function Navigator for Current Functional Status.   Therapy/Group: Individual Therapy  Netta Corrigan, PT, DPT 02/04/2017, 3:36 PM

## 2017-02-04 NOTE — Progress Notes (Signed)
Occupational Therapy Discharge Summary  Patient Details  Name: Aimee [name] Plan MRN: [id number] Date of Birth: 08-27-1975  Patient has met 12 of 12 long term goals due to improved activity tolerance, improved balance, ability to compensate for deficits, improved awareness and improved coordination. Pt made excellent progress with BADLs during this admission.  Pt uses BUE in all functional tasks WFL.  Pt completes bathing, dressing and toileting tasks at supervision level.  Pt performs tub transfers with tub transfer bench at supervision level.  [name] brother has been present for therapy/education and provides the appropriate level of supervision.   Patient to discharge at overall Supervision level.  Patient's care partner is independent to provide the necessary physical and cognitive assistance at discharge.      Recommendation:  Patient will benefit from ongoing skilled OT services in home health setting to continue to advance functional skills in the area of BADL and iADL.  Equipment: tub transfer bench, BSC  Reasons for discharge: treatment goals met  Patient/family agrees with progress made and goals achieved: Yes  OT Discharge   Vision Baseline Vision/History: No visual deficits Patient Visual Report: No change from baseline Vision Assessment?: No apparent visual deficits Perception  Perception: Within Functional Limits Inattention/Neglect: Appears intact Praxis Praxis: Intact Cognition Overall Cognitive Status: Within Functional Limits for tasks assessed Arousal/Alertness: Awake/alert Orientation Level: Oriented X4 Attention: Selective Focused Attention: Appears intact Selective Attention: Appears intact Memory: Appears intact Awareness: Appears intact Problem Solving: Appears intact Sensation Sensation Light Touch: Appears Intact Stereognosis: Appears Intact Hot/Cold: Appears Intact Proprioception: Appears Intact Coordination Gross Motor Movements are Fluid  and Coordinated: Yes Fine Motor Movements are Fluid and Coordinated: Yes Trunk/Postural Assessment  Cervical Assessment Cervical Assessment: Within Functional Limits Thoracic Assessment Thoracic Assessment: Exceptions to [facility] (flexed posture) Lumbar Assessment Lumbar Assessment: Exceptions to [facility] (posterior pelvic tilt) Postural Control Postural Control: Within Functional Limits  Balance Static Sitting Balance Static Sitting - Level of Assistance: 7: Independent Dynamic Sitting Balance Dynamic Sitting - Level of Assistance: 6: Modified independent (Device/Increase time) Extremity/Trunk Assessment RUE Assessment RUE Assessment: Within Functional Limits     See Function Navigator for Current Functional Status.  [name] [facility] 02/04/2017, 7:32 AM

## 2017-02-04 NOTE — Discharge Summary (Signed)
Discharge summary job # [id number]

## 2017-02-04 NOTE — Progress Notes (Addendum)
Physical Therapy Discharge Summary  Patient Details  Name: Aimee [name] MRN: [id number] Date of Birth: [birth year]  Today's Date: 02/04/2017 PT Individual Time: 1300-1400 PT Individual Time Calculation (min): 60 min    Patient has met 9 of 9 long term goals due to improved activity tolerance, improved balance, improved postural control, increased strength, decreased pain, ability to compensate for deficits, functional use of  right upper extremity, right lower extremity, left upper extremity and left lower extremity, improved awareness and improved coordination.  Patient to discharge at an ambulatory level Supervision.  Patient's care partner is independent to provide the necessary physical assistance at discharge.  Reasons goals not met: All goals met  Recommendation:  Patient will benefit from ongoing skilled PT services in home health setting to continue to advance safe functional mobility, address ongoing impairments in balance, strength, coordination, activity tolerance, and minimize fall risk.  Equipment: RW  Reasons for discharge: treatment goals met and discharge from hospital  Patient/family agrees with progress made and goals achieved: Yes  PT Discharge Precautions/Restrictions Restrictions RLE Weight Bearing: Weight bearing as tolerated LLE Weight Bearing: Weight bearing as tolerated Pain Pain Assessment Pain Assessment: 0-10 Pain Score: 3  Pain Type: Chronic pain Pain Location: Leg Pain Orientation: Left Pain Descriptors / Indicators: Aching Pain Frequency: Constant Pain Onset: On-going Pain Intervention(s): Medication (See eMAR) Vision/Perception    WFL Cognition Overall Cognitive Status: Within Functional Limits for tasks assessed Arousal/Alertness: Awake/alert Orientation Level: Oriented X4 Attention: Selective Focused Attention: Appears intact Selective Attention: Appears intact Memory: Appears intact Awareness: Appears intact Problem Solving:  Appears intact Sensation Sensation Light Touch: Appears Intact Stereognosis: Appears Intact Hot/Cold: Appears Intact Proprioception: Appears Intact Coordination Gross Motor Movements are Fluid and Coordinated: Yes Fine Motor Movements are Fluid and Coordinated: Yes Motor  Motor Motor - Discharge Observations: quadriparesis, L extremities more affected than R  Mobility Bed Mobility Bed Mobility: Sit to Supine;Supine to Sit Supine to Sit: 6: Modified independent (Device/Increase time);HOB flat Sit to Supine: 6: Modified independent (Device/Increase time);HOB flat Transfers Transfers: Yes Sit to Stand: 6: Modified independent (Device/Increase time) Stand to Sit: 6: Modified independent (Device/Increase time) Stand Pivot Transfers: 6: Modified independent (Device/Increase time);Other (comment) (with RW; S without AD) Locomotion  Ambulation Ambulation: Yes Ambulation/Gait Assistance: 5: Supervision Ambulation Distance (Feet): 150 Feet Assistive device: Rolling walker;Other (Comment) (dycem on B handles to improve grip) Ambulation/Gait Assistance Details: Verbal cues for precautions/safety;Verbal cues for gait pattern Gait Gait: Yes Gait Pattern: Impaired Gait Pattern: Lateral hip instability;Right foot flat;Left foot flat;Decreased stride length;Decreased weight shift to left Stairs / Additional Locomotion Stairs: Yes Stairs Assistance: 5: Supervision Stairs Assistance Details: Verbal cues for precautions/safety;Verbal cues for sequencing Stair Management Technique: Two rails;Step to pattern 6 inch stairs Ramp: 5: Supervision Curb: 5: Psychiatric nurse: Yes Wheelchair Assistance: 5: Investment banker, operational Details: Verbal cues for Marketing executive: Both upper extremities Wheelchair Parts Management: Supervision/cueing Distance: 150'  Trunk/Postural Assessment  Cervical Assessment Cervical Assessment: Within  Functional Limits Thoracic Assessment Thoracic Assessment: Exceptions to [facility] (increased kyphosis, rounded shoulders) Lumbar Assessment Lumbar Assessment: Exceptions to [facility] (posterior pelvic tilt, reduced lumbar lordosis) Postural Control Postural Control: Within Functional Limits Trunk Control: WFL Protective Responses: delayed  Balance Balance Balance Assessed: Yes Standardized Balance Assessment Standardized Balance Assessment: Berg Balance Test;Timed Up and Go Test Berg Balance Test Sit to Stand: Able to stand without using hands and stabilize independently Standing Unsupported: Able to stand safely 2 minutes Sitting with Back Unsupported but Feet Supported on  Floor or Stool: Able to sit safely and securely 2 minutes Stand to Sit: Controls descent by using hands Transfers: Able to transfer with verbal cueing and /or supervision Standing Unsupported with Eyes Closed: Able to stand 10 seconds safely Standing Ubsupported with Feet Together: Able to place feet together independently and stand for 1 minute with supervision From Standing, Reach Forward with Outstretched Arm: Reaches forward but needs supervision From Standing Position, Pick up Object from Floor: Able to pick up shoe, needs supervision From Standing Position, Turn to Look Behind Over each Shoulder: Looks behind from both sides and weight shifts well Turn 360 Degrees: Able to turn 360 degrees safely but slowly Standing Unsupported, Alternately Place Feet on Step/Stool: Able to complete >2 steps/needs minimal assist Standing Unsupported, One Foot in Front: Able to plae foot ahead of the other independently and hold 30 seconds Standing on One Leg: Tries to lift leg/unable to hold 3 seconds but remains standing independently Total Score: 39 Timed Up and Go Test TUG: Normal TUG Normal TUG (seconds): 16 (with RW) Static Sitting Balance Static Sitting - Level of Assistance: 7: Independent Dynamic Sitting Balance Dynamic  Sitting - Level of Assistance: 6: Modified independent (Device/Increase time) Static Standing Balance Static Standing - Level of Assistance: 5: Stand by assistance Dynamic Standing Balance Dynamic Standing - Balance Support: Bilateral upper extremity supported Dynamic Standing - Level of Assistance: 5: Stand by assistance Dynamic Standing - Balance Activities: Lateral lean/weight shifting;Forward lean/weight shifting;Reaching for objects Extremity Assessment  RUE Assessment RUE Assessment: Within Functional Limits LUE Assessment LUE Assessment: Within Functional Limits RLE Assessment RLE Assessment: Within Functional Limits RLE Strength RLE Overall Strength Comments: grossly 4+/5 throughout LLE Assessment LLE Assessment: Exceptions to [facility] LLE Strength LLE Overall Strength: Deficits Left Hip Flexion: 4-/5 Left Hip Extension: 4/5 Left Hip ABduction: 4-/5 Left Knee Flexion: 4-/5 Left Knee Extension: 4-/5 Left Ankle Dorsiflexion: 4/5 Left Ankle Plantar Flexion: 4+/5  Skilled Therapeutic Intervention: Pt received supine in bed, denies pain and agreeable to treatment. Supine>sit modI. Gait in/out of bathroom with RW and S. Pt performs clothing management and hygiene with modI. Assessed all mobility and balance outcome measures as above with scores indicative of high falls risk; reviewed recommendations with pt to use RW and have S when ambulating in home and community. Pt with no further questions or concerns regarding d/c home at this time. Remained seated in w/c at end of session, all needs in reach.    See Function Navigator for Current Functional Status.  Benjiman Core Tygielski 02/04/2017, 2:02 PM

## 2017-02-04 NOTE — Progress Notes (Signed)
[place] PHYSICAL MEDICINE & REHABILITATION     PROGRESS NOTE    Subjective/Complaints: Pt seen laying in bed this AM.  She slept well overnight.  She is looking forward to discharge tomorrow.   ROS: Denies nausea, vomiting, diarrhea, shortness of breath or chest pain  Objective: Vital Signs: Blood pressure 111/68, pulse 90, temperature 98.2 F (36.8 C), temperature source Oral, resp. rate 18, weight 86.2 kg (190 lb 0.6 oz), SpO2 99 %. No results found.  Recent Labs  02/02/17 0409  WBC 6.4  HGB 10.6*  HCT 33.2*  PLT 290    Recent Labs  02/02/17 0409  CREATININE 0.48   CBG (last 3)   Recent Labs  02/03/17 1643 02/03/17 2116 02/04/17 0608  GLUCAP 83 96 92    Wt Readings from Last 3 Encounters:  01/29/17 86.2 kg (190 lb 0.6 oz)  01/07/17 88.9 kg (196 lb)    Physical Exam:  Constitutional: She appears well-developed. NAD. HENT: Normocephalic and atraumatic.  Eyes: EOMI. No discharge.  Cardiovascular: RRR. No JVD  Respiratory: CTA Bilaterally. Normal effort  GI: soft, BS+  Musculoskeletal:  She exhibits no edema, mild calf tenderness on left Neurological: Alert and oriented Motor: RUE:5/5  LUE: 5/5  RLE: 4/5 right hip flexion, KE, 4+/5 ADF/PF LLE:  4/5 proximal to 4+/5 distal (stable) Skin: Skin is warm and dry. Intact Psychiatric: She has a normal mood and affect. Her behavior is normal.   Assessment/Plan: 1. 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 Physiatrist is providing close team supervision and 24 hour management of active medical problems listed below. Physiatrist and rehab team continue to assess barriers to discharge/monitor patient progress toward functional and medical goals.  Function:  Bathing Bathing position   Position: Shower  Bathing parts Body parts bathed by patient: Right arm,  Left arm, Chest, Abdomen, Front perineal area, Buttocks, Right upper leg, Left upper leg, Right lower leg, Left lower leg, Back Body parts bathed by helper: Buttocks  Bathing assist Assist Level: Supervision or verbal cues, Set up      Upper Body Dressing/Undressing Upper body dressing   What is the patient wearing?: Bra, Pull over shirt/dress Bra - Perfomed by patient: Thread/unthread right bra strap, Thread/unthread left bra strap, Hook/unhook bra (pull down sports bra) Bra - Perfomed by helper: Hook/unhook bra (pull down sports bra) Pull over shirt/dress - Perfomed by patient: Thread/unthread right sleeve, Thread/unthread left sleeve, Put head through opening, Pull shirt over trunk Pull over shirt/dress - Perfomed by helper: Pull shirt over trunk        Upper body assist Assist Level: Supervision or verbal cues   Set up : To obtain clothing/put away  Lower Body Dressing/Undressing Lower body dressing   What is the patient wearing?: Underwear, Pants, Socks, Shoes, [employer]  - Performed by patient: Thread/unthread right underwear leg, Pull underwear up/down, Thread/unthread left underwear leg Underwear - Performed by helper: Thread/unthread left underwear leg Pants- Performed by patient: Thread/unthread right pants leg, Thread/unthread left pants leg, Pull pants up/down Pants- Performed by helper: Thread/unthread left pants leg   Non-skid slipper socks- Performed by helper: Don/doff right sock, Don/doff left sock Socks - Performed by patient: Don/doff right sock, Don/doff left sock Socks - Performed by helper: Don/doff right sock Shoes - Performed by patient: Don/doff right shoe, Don/doff left shoe, Fasten right, Fasten left Shoes - Performed by helper: Don/doff left  shoe       TED Hose - Performed by helper: Don/doff right TED hose, Don/doff left TED hose  Lower body assist Assist for lower body dressing: Set up   Set up : Don/doff TED stockings  Toileting Toileting  Toileting activity did not occur: N/A Toileting steps completed by patient: Adjust clothing prior to toileting, Performs perineal hygiene, Adjust clothing after toileting Toileting steps completed by helper: Adjust clothing prior to toileting, Performs perineal hygiene, Adjust clothing after toileting Toileting Assistive Devices: Other (comment) (RW)  Toileting assist Assist level: Supervision or verbal cues   Transfers Chair/bed transfer   Chair/bed transfer method: Stand pivot, Ambulatory Chair/bed transfer assist level: Supervision or verbal cues Chair/bed transfer assistive device: Armrests, Walker Mechanical lift: Ecologist Ambulation activity did not occur: Safety/medical concerns (quadriplegia)   Max distance: 147ft Assist level: Supervision or verbal cues   Wheelchair   Type:  (TBD) Max wheelchair distance: 194ft Assist Level: Supervision or verbal cues  Cognition Comprehension Comprehension assist level: Follows complex conversation/direction with no assist  Expression Expression assist level: Expresses complex 90% of the time/cues < 10% of the time  Social Interaction Social Interaction assist level: Interacts appropriately with others - No medications needed.  Problem Solving Problem solving assist level: Solves complex problems: Recognizes & self-corrects  Memory Memory assist level: Complete Independence: No helper   Medical Problem List and Plan: 1.  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 Also, new right tibioperoneal trunk and distal tibial DVT, started on Xarelto  3. Pain Management: Ultram as needed  Bilateral calf  strain, improved with BenGay cream 4. Mood: Provide emotional support 5. Neuropsych: This patient is capable of making decisions on her own behalf. 6. Skin/Wound Care: Routine skin checks 7. Fluids/Electrolytes/Nutrition: Routine I&Os  -encourage PO  Labs ordered for tomorrow 8. Seizure prophylaxis. Keppra 500 mg every 12 hours, no seizure activity  9. Diabetes mellitus.  Resumed glucophage 500mg  qd  Controlled 7/2 CBG (last 3)   Recent Labs  02/03/17 1643 02/03/17 2116 02/04/17 0608  GLUCAP 83 96 92   11. Obesity. BMI 34.7 KG. Dietary follow-up 12. ABLA  Hb 10.6 on 6/30  Cont to monitor  Labs ordered for tomorrow  LOS (Days) 28 A FACE TO FACE EVALUATION WAS PERFORMED  Maveryck Bahri Lorie Phenix, MD 02/04/2017 10:46 AM

## 2017-02-04 NOTE — Progress Notes (Signed)
Occupational Therapy Session Note  Patient Details  Name: Aimee [name] MRN: [id number] Date of Birth: 02/05/1976  Today's Date: 02/04/2017 OT Individual Time: 7989-2119 and 4174-0814 OT Individual Time Calculation (min): 29 min and 31 min    Short Term Goals: Week 3:  OT Short Term Goal 1 (Week 3): Pt will perform UB dressing tasks with supervision OT Short Term Goal 2 (Week 3): Pt will complete LB dressing tasks with supervision OT Short Term Goal 3 (Week 3): Pt will complete shower transfer with supervisoin OT Short Term Goal 4 (Week 3): Pt will complete toilet transfer with supervisoin  Skilled Therapeutic Interventions/Progress Updates:    Session 1 with focus on UE fine motor and standing endurance. Pt completed thera-putty activity with focus on L UE, completing seated in w/c and progressing to standing at tabletop with supervision throughout. Pt then participated in standing card game using L UE to flip and manipulate cards throughout game completion. Pt able to complete both standing activities without rest break. Pt requesting to return to bed end of session to rest, completing brief room level functional mobility with RW to return to sitting EOB and supine in bed with supervision throughout. Pt left supine in bed, call bell and needs within reach.   Session 2 with focus on activity tolerance, dynamic standing balance, and bil fine motor coordination. Pt completing hallway functional mobility to rehab gym with RW and supervision throughout. Pt stood at table top to complete building activity using PVC pipe and corresponding photos, reaching outside BOS to lower right side to obtain pieces and with supervision throughout. Pt completed standing activity approx 20 min without rest break. Brief seated rest break prior to returning to room in manner as described above. Pt left seated in w/c, call bell and needs within reach.   Therapy Documentation Precautions:   Precautions Precautions: Fall Precaution Comments: quadriplegia (LUE/LLE more impaired) PRAFOs Restrictions Weight Bearing Restrictions: No RLE Weight Bearing: Weight bearing as tolerated LLE Weight Bearing: Weight bearing as tolerated    Pain: Pain Assessment Pain Assessment: 0-10 Pain Score: 2  Pain Type: Chronic pain Pain Location: Leg Pain Orientation: Left Pain Descriptors / Indicators: Aching Pain Frequency: Constant Pain Onset: On-going Pain Intervention(s): Repositioned;Rest ADL: ADL ADL Comments: refer functional navigator     See Function Navigator for Current Functional Status.   Therapy/Group: Individual Therapy  Raymondo Band 02/04/2017, 3:21 PM

## 2017-02-05 DIAGNOSIS — I82403 Acute embolism and thrombosis of unspecified deep veins of lower extremity, bilateral: Secondary | ICD-10-CM

## 2017-02-05 LAB — CBC WITH DIFFERENTIAL/PLATELET
Basophils Absolute: 0 10*3/uL (ref 0.0–0.1)
Basophils Relative: 0 %
Eosinophils Absolute: 0.1 10*3/uL (ref 0.0–0.7)
Eosinophils Relative: 2 %
HCT: 36.3 % (ref 36.0–46.0)
Hemoglobin: 11.3 g/dL — ABNORMAL LOW (ref 12.0–15.0)
Lymphocytes Relative: 29 %
Lymphs Abs: 1.8 10*3/uL (ref 0.7–4.0)
MCH: 27.2 pg (ref 26.0–34.0)
MCHC: 31.1 g/dL (ref 30.0–36.0)
MCV: 87.3 fL (ref 78.0–100.0)
Monocytes Absolute: 0.4 10*3/uL (ref 0.1–1.0)
Monocytes Relative: 7 %
Neutro Abs: 3.9 10*3/uL (ref 1.7–7.7)
Neutrophils Relative %: 62 %
Platelets: 381 10*3/uL (ref 150–400)
RBC: 4.16 MIL/uL (ref 3.87–5.11)
RDW: 16.3 % — ABNORMAL HIGH (ref 11.5–15.5)
WBC: 6.3 10*3/uL (ref 4.0–10.5)

## 2017-02-05 LAB — BASIC METABOLIC PANEL WITH GFR
Anion gap: 7 (ref 5–15)
BUN: 10 mg/dL (ref 6–20)
CO2: 24 mmol/L (ref 22–32)
Calcium: 8.8 mg/dL — ABNORMAL LOW (ref 8.9–10.3)
Chloride: 108 mmol/L (ref 101–111)
Creatinine, Ser: 0.45 mg/dL (ref 0.44–1.00)
GFR calc Af Amer: >60 mL/min (ref >60)
GFR calc non Af Amer: >60 mL/min (ref >60)
Glucose, Bld: 93 mg/dL (ref 65–99)
Potassium: 3.8 mmol/L (ref 3.5–5.1)
Sodium: 139 mmol/L (ref 135–145)

## 2017-02-05 LAB — GLUCOSE, CAPILLARY
Glucose-Capillary: 85 mg/dL (ref 65–99)
Glucose-Capillary: 94 mg/dL (ref 65–99)

## 2017-02-05 MED ORDER — METFORMIN HCL 500 MG PO TABS: 500.0000 mg | ORAL_TABLET | Freq: Every day | ORAL | 0 refills | Status: DC

## 2017-02-05 MED ORDER — RIVAROXABAN 15 MG PO TABS: ORAL_TABLET | ORAL | 0 refills | Status: DC

## 2017-02-05 MED ORDER — DOCUSATE SODIUM 100 MG PO CAPS: 100.0000 mg | ORAL_CAPSULE | Freq: Every day | ORAL | 0 refills | Status: DC

## 2017-02-05 MED ORDER — PANTOPRAZOLE SODIUM 40 MG PO TBEC: 40.0000 mg | DELAYED_RELEASE_TABLET | Freq: Every day | ORAL | 0 refills | Status: DC

## 2017-02-05 MED ORDER — TRAMADOL HCL 50 MG PO TABS: 50.0000 mg | ORAL_TABLET | Freq: Four times a day (QID) | ORAL | 0 refills | Status: DC | PRN

## 2017-02-05 MED ORDER — RIVAROXABAN 15 MG PO TABS: 15.0000 mg | ORAL_TABLET | Freq: Two times a day (BID) | ORAL | 0 refills | Status: DC

## 2017-02-05 MED ORDER — RIVAROXABAN 20 MG PO TABS: ORAL_TABLET | ORAL | 0 refills | Status: DC

## 2017-02-05 MED ORDER — LEVETIRACETAM 500 MG PO TABS: 500.0000 mg | ORAL_TABLET | Freq: Two times a day (BID) | ORAL | 0 refills | Status: DC

## 2017-02-05 MED ORDER — RIVAROXABAN 20 MG PO TABS: 20.0000 mg | ORAL_TABLET | Freq: Every day | ORAL | 0 refills | Status: DC

## 2017-02-05 NOTE — Discharge Summary (Signed)
[name] NO.:  [id number]  MEDICAL RECORD NO.:  [id number]  LOCATION:                                 FACILITY:  PHYSICIAN:  Meredith Staggers, M.D.DATE OF BIRTH:  09-Apr-1976  DATE OF ADMISSION:  01/07/2017 DATE OF DISCHARGE:  02/05/2017                              DISCHARGE SUMMARY   DISCHARGE DIAGNOSES: 1. Right posterior communicating artery aneurysm status post coiling,     complicated by scattered acute embolic infarctions. 2. Left calf deep vein thrombosis, popliteal, and femoral veins on the     left side as well as right tibioperoneal trunk and distal tibial     DVT. 3. Pain management. 4. Seizure prophylaxis. 5. Diabetes mellitus. 6. Morbid obesity. 7. Acute blood loss anemia.  HISTORY OF PRESENT ILLNESS:  This is a 41 year old Hispanic female with history of obesity, hypertension, diabetes mellitus, who lives with a brother and sister, independent prior to admission, working as a Retail buyer.  Presented on Dec 25, 2016 after being found unresponsive, intubated in the ED, where CT of the head showed SAH.  Per report, diffuse subarachnoid hemorrhage filling CSF spaces in the suprasellar and basal cisterns as well as bilateral sulci.  CT angiogram of head and neck showed a 7 x 6 x 5 mm right posterior communicating artery aneurysm, underwent coiling per Interventional Radiology, maintained on Keppra for seizure prophylaxis.  MRI of the brain showed scattered acute embolic infarctions of both cerebral hemispheres on left cerebellum. Echocardiogram with ejection fraction of 09% grade 1 diastolic dysfunction.  Maintained on Decadron protocol.  Completing course of Nimotop for blood pressure control.  Nasogastric tube initially in place. Diet; advance as tolerated.  The patient was admitted for a comprehensive rehab program.  PAST MEDICAL HISTORY:  See discharge diagnoses.  SOCIAL HISTORY:  Lives with brother and sister, independent prior  to admission.  FUNCTIONAL STATUS:  On admission to rehab services, was +2 physical assist sit to supine, +2 physical assist rolling in the bed, max total assist activities of daily living.  PHYSICAL EXAMINATION:  VITAL SIGNS:  Blood pressure 148/91, pulse 74, temperature 98, respirations 13. GENERAL:  This is an alert female, oriented x3. HEENT:  EOMs intact. NECK:  Supple.  Nontender.  No JVD. CARDIAC:  Regular rate and rhythm.  No murmur. ABDOMEN:  Soft, nontender.  Good bowel sounds. LUNGS:  Clear to auscultation without wheeze. EXTREMITIES:  Left upper extremity, shoulder abduction 2+/5, elbow flexion and extension 2-/5, hand grip 4-/5, right lower extremity 2/5, hip flexion 2/5, ankle dorsiflexion and plantar flexion of left lower extremity is 0/5.  Right upper extremity is 4+/5 proximal to distal.  REHABILITATION HOSPITAL COURSE:  The patient was admitted to inpatient rehab services.  Therapy was initiated on a 3-hour daily basis consisting of physical therapy, occupational therapy, speech therapy, and rehabilitation nursing.  The following issues were addressed during the patient's rehabilitation stay.  Pertaining to [name] quadriparesis secondary to right posterior communicating artery aneurysm, she had underwent coiling complicated by scattered acute embolic infarctions with posterior hemisphere.  She would follow up with Neurology Services.  Noted hospital course, left DVT of popliteal femoral veins as well as right tibioperoneal trunk and distal tibial  DVT.  A followup CT had been monitored for any new blood at the recommendations of Neurology Services.  Xarelto had been initiated.  No chest pain or shortness of breath.  Pain managed with the use of Ultram as needed.  Blood pressure is well controlled.  She remained on Keppra for seizure prophylaxis.  No seizure activity.  Blood sugar is overall controlled on low dose Glucophage.  Morbid obesity BMI of 34.7 kg  and dietary followup.  The patient received weekly collaborative interdisciplinary team conferences to discuss estimated length of stay, family teaching, any barriers to her discharge.  The patient had ongoing family education.  She ambulates to the bathroom with supervision rolling walker, but required minimal assist to perform sit to stand with regular toileting.  The patient's brother educated on car transfers and techniques, perform car transfer with supervision using rolling walker, ambulating 100 feet to the rehab apartment to practise  sit to stand from a low chair.  She can transition from mat to floor with minimal assist to help guide patient to the floor, activities of daily living and homemaking, complete toileting with supervision, transfers to toilet with supervision to bathe as well as tub bench, dons upper body and lower body clothing, sit at edge of bed in supervision.  Full family teaching was completed and planned discharge to home.  DISCHARGE MEDICATIONS:  Included, 1. Colace 100 mg p.o. daily. 2. Keppra 500 mg p.o. b.i.d. 3. Glucophage 500 mg p.o. daily. 4. Xarelto 50 mg p.o. b.i.d. x21 doses, transition to 20 mg daily. 5. Ultram 50 to 100 mg every 6 hours as needed moderate pain. 6. Protonix 40 mg p.o. daily.  DIET:  Her diet was regular consistency.  FOLLOWUP:  She would follow up with Dr. Alger Simons at the outpatient rehab service office as directed.  Dr. Kathyrn Sheriff, Neurosurgery, call for appointment.  Dr. Erlinda Hong, Neurology Services, call for appointment in 6 weeks.     Lauraine Rinne, P.A.   ______________________________ Meredith Staggers, M.D.    DA/MEDQ  D:  02/04/2017  T:  02/05/2017  Job:  [id number]  cc:   Meredith Staggers, M.D. Dr. Erlinda Hong Dr. Kathyrn Sheriff

## 2017-02-05 NOTE — Progress Notes (Signed)
[place] PHYSICAL MEDICINE & REHABILITATION     PROGRESS NOTE    Subjective/Complaints: Up in chair. No complaints. Happy to be going home  ROS: pt denies nausea, vomiting, diarrhea, cough, shortness of breath or chest pain   Objective: Vital Signs: Blood pressure 109/67, pulse 78, temperature 98.1 F (36.7 C), temperature source Oral, resp. rate 16, weight 86.2 kg (190 lb 0.6 oz), SpO2 99 %. No results found.  Recent Labs  02/05/17 0443  WBC 6.3  HGB 11.3*  HCT 36.3  PLT 381    Recent Labs  02/05/17 0443  NA 139  K 3.8  CL 108  GLUCOSE 93  BUN 10  CREATININE 0.45  CALCIUM 8.8*   CBG (last 3)   Recent Labs  02/04/17 2102 02/05/17 0447 02/05/17 0608  GLUCAP 92 85 94    Wt Readings from Last 3 Encounters:  01/29/17 86.2 kg (190 lb 0.6 oz)  01/07/17 88.9 kg (196 lb)    Physical Exam:  Constitutional: She appears well-developed. NAD. HENT: Normocephalic and atraumatic.  Eyes: EOMI. No discharge.  Cardiovascular: rrr Respiratory: cta GI: soft, BS+  Musculoskeletal:  She exhibits no edema, mild calf tenderness on left Neurological: Alert and oriented Motor: RUE:5/5  LUE: 5/5  RLE: 4/5 right hip flexion, KE, 4+/5 ADF/PF LLE:  4/5 proximal to 4+/5 distal (stable) Skin: Skin is warm and dry. Intact Psychiatric: She has a normal mood and affect. Her behavior is normal.   Assessment/Plan: 1. 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 Physiatrist is providing close team supervision and 24 hour management of active medical problems listed below. Physiatrist and rehab team continue to assess barriers to discharge/monitor patient progress toward functional and medical goals.  Function:  Bathing Bathing position   Position: Shower  Bathing parts Body parts bathed by patient: Right arm, Left arm, Chest,  Abdomen, Front perineal area, Buttocks, Right upper leg, Left upper leg, Right lower leg, Left lower leg, Back Body parts bathed by helper: Buttocks  Bathing assist Assist Level: Supervision or verbal cues, Set up      Upper Body Dressing/Undressing Upper body dressing   What is the patient wearing?: Bra, Pull over shirt/dress Bra - Perfomed by patient: Thread/unthread right bra strap, Thread/unthread left bra strap, Hook/unhook bra (pull down sports bra) Bra - Perfomed by helper: Hook/unhook bra (pull down sports bra) Pull over shirt/dress - Perfomed by patient: Thread/unthread right sleeve, Thread/unthread left sleeve, Put head through opening, Pull shirt over trunk Pull over shirt/dress - Perfomed by helper: Pull shirt over trunk        Upper body assist Assist Level: Supervision or verbal cues   Set up : To obtain clothing/put away  Lower Body Dressing/Undressing Lower body dressing   What is the patient wearing?: Underwear, Pants, Socks, Shoes, [employer]  - Performed by patient: Thread/unthread right underwear leg, Pull underwear up/down, Thread/unthread left underwear leg Underwear - Performed by helper: Thread/unthread left underwear leg Pants- Performed by patient: Thread/unthread right pants leg, Thread/unthread left pants leg, Pull pants up/down Pants- Performed by helper: Thread/unthread left pants leg   Non-skid slipper socks- Performed by helper: Don/doff right sock, Don/doff left sock Socks - Performed by patient: Don/doff right sock, Don/doff left sock Socks - Performed by helper: Don/doff right sock Shoes - Performed by patient: Don/doff right shoe, Don/doff left shoe, Fasten right, Fasten left Shoes - Performed by  helper: Don/doff left shoe       TED Hose - Performed by helper: Don/doff right TED hose, Don/doff left TED hose  Lower body assist Assist for lower body dressing: Set up   Set up : Don/doff TED stockings  Toileting Toileting Toileting  activity did not occur: N/A Toileting steps completed by patient: Adjust clothing prior to toileting, Performs perineal hygiene, Adjust clothing after toileting Toileting steps completed by helper: Adjust clothing prior to toileting, Performs perineal hygiene, Adjust clothing after toileting Toileting Assistive Devices: Grab bar or rail  Toileting assist Assist level: More than reasonable time   Transfers Chair/bed transfer   Chair/bed transfer method: Stand pivot, Ambulatory Chair/bed transfer assist level: No Help, no cues, assistive device, takes more than a reasonable amount of time Chair/bed transfer assistive device: Armrests, Environmental manager lift: Ecologist Ambulation activity did not occur: Safety/medical concerns (quadriplegia)   Max distance: 155 Assist level: Supervision or verbal cues   Wheelchair Wheelchair activity did not occur: N/A Type:  (TBD) Max wheelchair distance: 126ft Assist Level: Supervision or verbal cues  Cognition Comprehension Comprehension assist level: Follows complex conversation/direction with no assist  Expression Expression assist level: Expresses complex ideas: With extra time/assistive device  Social Interaction Social Interaction assist level: Interacts appropriately with others - No medications needed.  Problem Solving Problem solving assist level: Solves complex problems: Recognizes & self-corrects  Memory Memory assist level: Complete Independence: No helper   Medical Problem List and Plan: 1.  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  2.  DVT Prophylaxis/Anticoagulation:   Follow-up Doppler 01/30/2017 shows propagation of left calf DVT into the popliteal and femoral  veins on the left side. Also, new right tibioperoneal trunk and distal tibial DVT, started on Xarelto ---3 month+ duration 3. Pain Management: Ultram as needed  Bilateral calf strain, improved with BenGay cream 4. Mood: Provide emotional support 5. Neuropsych: This patient is capable of making decisions on her own behalf. 6. Skin/Wound Care: Routine skin checks 7. Fluids/Electrolytes/Nutrition: Routine I&Os  -encourage PO  I personally reviewed the patient's labs today.   8. Seizure prophylaxis. Keppra 500 mg every 12 hours, no seizure activity  9. Diabetes mellitus.  Resumed glucophage 500mg  qd  Controlled 7/2 CBG (last 3)   Recent Labs  02/04/17 2102 02/05/17 0447 02/05/17 0608  GLUCAP 92 85 94   11. Obesity. BMI 34.7 KG. Dietary follow-up 12. ABLA  Resolving  11.3 LOS (Days) 29 A FACE TO FACE EVALUATION WAS PERFORMED  Meredith Staggers, MD 02/05/2017 9:27 AM

## 2017-02-05 NOTE — Progress Notes (Signed)
Social Work  Discharge Note  The overall goal for the admission was met for:   Discharge location: Yes - home with brother and family able to provide 24/7 assistance  Length of Stay: Yes - 29 days  Discharge activity level: Yes - supervision overall  Home/community participation: Yes  Services provided included: MD, RD, PT, OT, SLP, RN, TR, Pharmacy, Neuropsych and SW  Financial Services: None - only eligible for emergency medicaid  Follow-up services arranged: Home Health: PT, OT via [place], DME: rolling walker, 3n1 commode and tub bench via [facility], Other: referred to [facility] med assist program as well as [place] for primary care and Patient/Family has no preference for HH/DME agencies  Comments (or additional information):  Patient/Family verbalized understanding of follow-up arrangements: Yes  Individual responsible for coordination of the follow-up plan: pt  Confirmed correct DME delivered: Brighten Buzzelli 02/05/2017    Charlese Gruetzmacher

## 2017-02-05 NOTE — Progress Notes (Signed)
D/C instructions given by Dot Lanes P.A., meds reviewed; pt denies further questions. Pt d/c home via W/C with family AOx4; denies pain or complaint.  Michela Pitcher

## 2017-02-08 ENCOUNTER — Ambulatory Visit: Payer: Self-pay | Attending: Internal Medicine | Admitting: Physician Assistant

## 2017-02-08 ENCOUNTER — Telehealth: Payer: Self-pay | Admitting: *Deleted

## 2017-02-08 VITALS — BP 119/88 | HR 90 | Temp 98.9°F | Ht 64.0 in | Wt 190.4 lb

## 2017-02-08 DIAGNOSIS — I6340 Cerebral infarction due to embolism of unspecified cerebral artery: Secondary | ICD-10-CM | POA: Insufficient documentation

## 2017-02-08 DIAGNOSIS — E118 Type 2 diabetes mellitus with unspecified complications: Secondary | ICD-10-CM

## 2017-02-08 DIAGNOSIS — Z7984 Long term (current) use of oral hypoglycemic drugs: Secondary | ICD-10-CM | POA: Insufficient documentation

## 2017-02-08 DIAGNOSIS — G8250 Quadriplegia, unspecified: Secondary | ICD-10-CM

## 2017-02-08 DIAGNOSIS — Z79899 Other long term (current) drug therapy: Secondary | ICD-10-CM | POA: Insufficient documentation

## 2017-02-08 DIAGNOSIS — I609 Nontraumatic subarachnoid hemorrhage, unspecified: Secondary | ICD-10-CM

## 2017-02-08 DIAGNOSIS — I607 Nontraumatic subarachnoid hemorrhage from unspecified intracranial artery: Secondary | ICD-10-CM | POA: Insufficient documentation

## 2017-02-08 DIAGNOSIS — I82409 Acute embolism and thrombosis of unspecified deep veins of unspecified lower extremity: Secondary | ICD-10-CM | POA: Insufficient documentation

## 2017-02-08 DIAGNOSIS — Z5189 Encounter for other specified aftercare: Secondary | ICD-10-CM | POA: Insufficient documentation

## 2017-02-08 DIAGNOSIS — E119 Type 2 diabetes mellitus without complications: Secondary | ICD-10-CM | POA: Insufficient documentation

## 2017-02-08 DIAGNOSIS — I729 Aneurysm of unspecified site: Secondary | ICD-10-CM | POA: Insufficient documentation

## 2017-02-08 DIAGNOSIS — I639 Cerebral infarction, unspecified: Secondary | ICD-10-CM

## 2017-02-08 MED ORDER — LEVETIRACETAM 500 MG PO TABS: 500.0000 mg | ORAL_TABLET | Freq: Two times a day (BID) | ORAL | 3 refills | Status: DC

## 2017-02-08 MED ORDER — RIVAROXABAN 15 MG PO TABS: ORAL_TABLET | ORAL | 0 refills | Status: DC

## 2017-02-08 MED ORDER — PANTOPRAZOLE SODIUM 40 MG PO TBEC: 40.0000 mg | DELAYED_RELEASE_TABLET | Freq: Every day | ORAL | 3 refills | Status: DC

## 2017-02-08 MED ORDER — METFORMIN HCL 500 MG PO TABS: 500.0000 mg | ORAL_TABLET | Freq: Every day | ORAL | 3 refills | Status: DC

## 2017-02-08 NOTE — Patient Instructions (Signed)
Call Dr. Erlinda Hong for an appointment in August  Call Dr. Kathyrn Sheriff for an appt in July

## 2017-02-08 NOTE — Progress Notes (Signed)
Aimee [name]  CXK:[id number]  HFW:[id number]  DOB - [birth year]  Chief Complaint  Patient presents with  . Hospitalization Follow-up    patient had a stroke       Subjective:   Aimee [name] is a 41 y.o. female here today for Establishment of care. She has a past medical history of diabetes mellitus and maybe some high blood pressure. She was admitted to the hospital on 12/25/2016 after being found with altered mental status. She was unresponsive and needed intubating in the field to protect her airway. 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 She is status post coiling by neurosurgery. Her hospital course was complicated by quadriparesis, left lower extremity DVT, vent dependent respiratory failure, dysphagia, scattered acute embolic infarctions and anemia. She required Keppra for seizure prophylaxis. She was sent to rehabilitation on 01/07/2017 and stayed there until 02/05/2017.   She has since been released and is having home health physical therapy 2 times weekly. She from a neurologic standpoint no headaches. No blurred vision. No seizures. She's getting stronger each day. She is compliant with her medications. Less pain, not requiring any pain medications. Try to make the proper dietary changes and continue to watch herself. She is staying with her brother. She is not driving.  ROS: GEN: denies fever or chills, denies change in weight Skin: denies lesions or rashes HEENT: denies headache, earache, epistaxis, sore throat, or neck pain LUNGS: denies SHOB, dyspnea, PND, orthopnea CV: denies CP or palpitations ABD: denies abd pain, N or V EXT: denies muscle spasms or swelling; no pain in lower ext, no weakness NEURO: denies numbness or tingling, denies sz, +stroke   ALLERGIES: No Known Allergies  PAST MEDICAL HISTORY: Past Medical History:  Diagnosis Date  . Diabetes mellitus without complication ([place])   .  Hypertension     PAST SURGICAL HISTORY: Past Surgical History:  Procedure Laterality Date  . IR ANGIO INTRA EXTRACRAN SEL INTERNAL CAROTID BILAT MOD SED  12/25/2016  . IR ANGIO VERTEBRAL SEL VERTEBRAL UNI L MOD SED  12/25/2016  . IR ANGIOGRAM FOLLOW UP STUDY  12/25/2016  . IR ANGIOGRAM FOLLOW UP STUDY  12/25/2016  . IR ANGIOGRAM FOLLOW UP STUDY  12/25/2016  . IR ANGIOGRAM FOLLOW UP STUDY  12/25/2016  . IR TRANSCATH/EMBOLIZ  12/25/2016  . RADIOLOGY WITH ANESTHESIA N/A 12/25/2016   Procedure: RADIOLOGY WITH ANESTHESIA;  Surgeon: Consuella Lose, MD;  Location: [place];  Service: Radiology;  Laterality: N/A;    MEDICATIONS AT HOME: Prior to Admission medications   Medication Sig Start Date End Date Taking? Authorizing Provider  levETIRAcetam (KEPPRA) 500 MG tablet Take 1 tablet (500 mg total) by mouth 2 (two) times daily. 02/08/17  Yes Ena Dawley, Rance Smithson S, PA-C  metFORMIN (GLUCOPHAGE) 500 MG tablet Take 1 tablet (500 mg total) by mouth daily with breakfast. 02/08/17  Yes Ena Dawley, Modena Bellemare S, PA-C  pantoprazole (PROTONIX) 40 MG tablet Take 1 tablet (40 mg total) by mouth daily before breakfast. 02/08/17  Yes Ena Dawley, Micah Galeno S, PA-C  Rivaroxaban (XARELTO) 15 MG TABS tablet 15 mg twice daily until 02/19/2017 and stop 02/08/17  Yes Alonna Bartling S, PA-C  rivaroxaban (XARELTO) 20 MG TABS tablet 20 mg daily beginning 02/20/2017 02/05/17  Yes Angiulli, Lavon Paganini, PA-C  docusate sodium (COLACE) 100 MG capsule Take 1 capsule (100 mg total) by mouth daily. Patient not taking: Reported on 02/08/2017 02/06/17   Angiulli, Lavon Paganini, PA-C  traMADol (ULTRAM) 50 MG tablet Take 1-2 tablets (  50-100 mg total) by mouth every 6 (six) hours as needed for moderate pain. Patient not taking: Reported on 02/08/2017 02/05/17   Angiulli, Lavon Paganini, PA-C    Fam-mom had a stroke  Social-unmarried, from [place], no smoke, unemployed, lives with brother  Objective:   Vitals:   02/08/17 1108  BP: 119/88  Pulse: 90  Temp: 98.9 F (37.2 C)  TempSrc:  Oral  SpO2: 100%  Weight: 190 lb 6.4 oz (86.4 kg)  Height: 5\' 4"  (1.626 m)    Exam General appearance : Awake, alert, not in any distress. Speech Clear. Not toxic looking Neck: supple, no JVD. No cervical lymphadenopathy.  Chest:Good air entry bilaterally, no added sounds  CVS: S1 S2 regular, no murmurs.  Abdomen: Bowel sounds present, Non tender and not distended with no guarding, rigidity or rebound. Extremities: B/L Lower Ext shows no edema, both legs are warm to touch Neurology: Awake alert, and oriented X 3, CN II-XII intact, Non focal Skin:No Rash Wounds:N/A   Assessment & [name]  1. SAH with posterior communicating aneurysm  -neurosurg follow up this month  -neuro follow up Aug  -RF modifcation  -HHPT  -no driving  2. Embolic CVAs  -As above  3. DVT  -cont Xarelto  4. Quadriparesis-resolving  -HHPT  -f/u Dr. Tessa Lerner 03/18/17   Return in about 2 weeks (around 02/22/2017).  The patient was given clear instructions to go to ER or return to medical center if symptoms don't improve, worsen or new problems develop. The patient verbalized understanding. The patient was told to call to get lab results if they haven't heard anything in the next week.   Total time spent with patient was 29 min (<15 level 2; 16-25 level 3, 26-30 level 4, >40 level 5). Greater than 50 % of this visit was spent face to face counseling and coordinating care regarding risk factor modification, compliance importance and encouragement, education related to CVA.  This note has been created with Surveyor, quantity. Any transcriptional errors are unintentional.    Zettie Pho, PA-C [facility] and [facility] [place], [place]   02/08/2017, 12:04 PM

## 2017-02-08 NOTE — Telephone Encounter (Signed)
Herbert Deaner, PT, [facility] left a message asking for verbal orders for Home Health Physical Therapy 2week4 to address functional mobility and verbal orders for medical social work for [employer].  Returned call and left message on secure line giving verbal orders for PT and social work per office protocol

## 2017-02-11 ENCOUNTER — Ambulatory Visit: Payer: Self-pay | Attending: Internal Medicine

## 2017-02-22 ENCOUNTER — Telehealth: Payer: Self-pay | Admitting: *Deleted

## 2017-02-22 NOTE — Telephone Encounter (Signed)
Jim PT [facility] called to report that Aimee [name] has been doing well with her therapy and doing sit ups and crunches on the bed without problem. Yesterday she moved to the floor and after 10 reps she vomited. The only change is she has a little mild tenderness L upper abd under rib cage but no other changes. VSS.  She is able to resume on bed without further problems.

## 2017-02-26 ENCOUNTER — Encounter: Payer: Self-pay | Admitting: Internal Medicine

## 2017-02-26 ENCOUNTER — Ambulatory Visit: Payer: Self-pay | Attending: Internal Medicine | Admitting: Internal Medicine

## 2017-02-26 VITALS — BP 129/85 | HR 69 | Temp 98.7°F | Resp 16 | Wt 198.0 lb

## 2017-02-26 DIAGNOSIS — Z79899 Other long term (current) drug therapy: Secondary | ICD-10-CM | POA: Insufficient documentation

## 2017-02-26 DIAGNOSIS — I639 Cerebral infarction, unspecified: Secondary | ICD-10-CM | POA: Insufficient documentation

## 2017-02-26 DIAGNOSIS — E119 Type 2 diabetes mellitus without complications: Secondary | ICD-10-CM | POA: Insufficient documentation

## 2017-02-26 DIAGNOSIS — I609 Nontraumatic subarachnoid hemorrhage, unspecified: Secondary | ICD-10-CM | POA: Insufficient documentation

## 2017-02-26 DIAGNOSIS — I82403 Acute embolism and thrombosis of unspecified deep veins of lower extremity, bilateral: Secondary | ICD-10-CM | POA: Insufficient documentation

## 2017-02-26 DIAGNOSIS — G8250 Quadriplegia, unspecified: Secondary | ICD-10-CM | POA: Insufficient documentation

## 2017-02-26 DIAGNOSIS — E669 Obesity, unspecified: Secondary | ICD-10-CM

## 2017-02-26 DIAGNOSIS — Z7901 Long term (current) use of anticoagulants: Secondary | ICD-10-CM | POA: Insufficient documentation

## 2017-02-26 DIAGNOSIS — E1169 Type 2 diabetes mellitus with other specified complication: Secondary | ICD-10-CM

## 2017-02-26 DIAGNOSIS — Z7984 Long term (current) use of oral hypoglycemic drugs: Secondary | ICD-10-CM | POA: Insufficient documentation

## 2017-02-26 DIAGNOSIS — R569 Unspecified convulsions: Secondary | ICD-10-CM | POA: Insufficient documentation

## 2017-02-26 LAB — GLUCOSE, POCT (MANUAL RESULT ENTRY): POC Glucose: 94 mg/dL (ref 70–99)

## 2017-02-26 MED ORDER — RIVAROXABAN 20 MG PO TABS: ORAL_TABLET | ORAL | 1 refills | Status: DC

## 2017-02-26 MED ORDER — TRUEPLUS LANCETS 28G MISC: 1 refills | Status: DC

## 2017-02-26 MED ORDER — TRAMADOL HCL 50 MG PO TABS: 50.0000 mg | ORAL_TABLET | Freq: Four times a day (QID) | ORAL | 0 refills | Status: DC | PRN

## 2017-02-26 MED ORDER — GLUCOSE BLOOD VI STRP: ORAL_STRIP | 12 refills | Status: DC

## 2017-02-26 MED ORDER — TRUE METRIX METER W/DEVICE KIT: PACK | 0 refills | Status: AC

## 2017-02-26 NOTE — Patient Instructions (Addendum)
Check your blood sugars 2-3 times a week before breakfast.  Goal blood sugar is 90-130.  Wear the compression socks on left leg during the day.

## 2017-02-26 NOTE — Progress Notes (Signed)
Patient ID: Aimee [name], female    DOB: 06-15-1976  MRN: [id number]  CC: Establish Care   Subjective: Aimee [name] is a 41 y.o. female who presents to become est with me as PCP. Her concerns today include:  41 year old FM with history of diabetes type 2, HTN, recent SAH (12/25/2016)with PCA aneurysm status post coiling, improving quadriparesis, LLE and RLE  (f/u doppler 01/30/2017 LT popliteal and femoral veins and new RT tibioperoneal trunk and distal tibialDVT on Xarelto.  Seen by our PA 7/6 post hosp for [facility]. Has f/u appt with Dr. Tessa Lerner 8/13 and Neuro Dr. Mechele Claude 9/5.  1. SAH/quad -still having home PT 2 x a wk. Progressed from walker to cane.  Lives with her brother. Not driving yet. -some soreness post P.T. Out of Tramadol. Request RF to keep on hand to use after P.T sessions -has appt coming up with PMR and Neurologist -she has applied for Medicaid and [place] card.  No decision as yet  2. DM:  -"I try to watch what I eat." Drinks mainly water and coffee. -compliant with Metformin. No machine to check BS -BS range 120-140s last wk in hosp  3. DVT + swelling in LLE during the day. Down first thing in a.m -no pain -no bleeding on Xarelto that was started end June   Patient Active Problem List   Diagnosis Date Noted  . Acute blood loss anemia   . Deep vein thrombosis (DVT) ([place])   . Gait disturbance, post-stroke 01/24/2017  . Reactive hypertension   . Quadriparesis ([place])   . Class 1 obesity due to excess calories with serious comorbidity and body mass index (BMI) of 30.0 to 30.9 in adult   . Benign essential HTN   . Diabetes mellitus type 2 in obese ([place])   . Seizure ([place])   . Subarachnoid hemorrhage due to ruptured aneurysm ([place]) 12/25/2016     Current Outpatient Prescriptions on File Prior to Visit  Medication Sig Dispense Refill  . docusate sodium (COLACE) 100 MG capsule Take 1 capsule (100 mg total) by mouth daily. (Patient not taking: Reported on  02/08/2017) 10 capsule 0  . levETIRAcetam (KEPPRA) 500 MG tablet Take 1 tablet (500 mg total) by mouth 2 (two) times daily. 60 tablet 3  . metFORMIN (GLUCOPHAGE) 500 MG tablet Take 1 tablet (500 mg total) by mouth daily with breakfast. 30 tablet 3  . pantoprazole (PROTONIX) 40 MG tablet Take 1 tablet (40 mg total) by mouth daily before breakfast. 30 tablet 3   No current facility-administered medications on file prior to visit.     No Known Allergies  Social History   Social History  . Marital status: Single    Spouse name: N/A  . Number of children: N/A  . Years of education: N/A   Occupational History  . Not on file.   Social History Main Topics  . Smoking status: Never Smoker  . Smokeless tobacco: Never Used  . Alcohol use No  . Drug use: Unknown  . Sexual activity: Not on file   Other Topics Concern  . Not on file   Social History Narrative  . No narrative on file    No family history on file.  Past Surgical History:  Procedure Laterality Date  . IR ANGIO INTRA EXTRACRAN SEL INTERNAL CAROTID BILAT MOD SED  12/25/2016  . IR ANGIO VERTEBRAL SEL VERTEBRAL UNI L MOD SED  12/25/2016  . IR ANGIOGRAM FOLLOW UP STUDY  12/25/2016  . IR ANGIOGRAM  FOLLOW UP STUDY  12/25/2016  . IR ANGIOGRAM FOLLOW UP STUDY  12/25/2016  . IR ANGIOGRAM FOLLOW UP STUDY  12/25/2016  . IR TRANSCATH/EMBOLIZ  12/25/2016  . RADIOLOGY WITH ANESTHESIA N/A 12/25/2016   Procedure: RADIOLOGY WITH ANESTHESIA;  Surgeon: Consuella Lose, MD;  Location: [place];  Service: Radiology;  Laterality: N/A;    ROS: Review of Systems -negative as stated above  PHYSICAL EXAM: BP 129/85   Pulse 69   Temp 98.7 F (37.1 C) (Oral)   Resp 16   Wt 198 lb (89.8 kg)   LMP 01/31/2017   SpO2 99%   BMI 33.99 kg/m    Physical Exam General appearance - alert, well appearing, middle age female and in no distress Mental status - alert, oriented to person, place, and time, normal mood, behavior, speech, dress, motor  activity, and thought processes Mouth - mucous membranes moist, pharynx normal without lesions Neck - supple, no significant adenopathy Chest - clear to auscultation, no wheezes, rales or rhonchi, symmetric air entry Heart - normal rate, regular rhythm, normal S1, S2, no murmurs, rubs, clicks or gallops Extremities - peripheral pulses normal, 1+ edema LLE Neuro: ambulating with cane Grip 5/5, power UEs 4+5 Diabetic Foot Exam - Simple   Simple Foot Form Visual Inspection No deformities, no ulcerations, no other skin breakdown bilaterally:  Yes Sensation Testing Intact to touch and monofilament testing bilaterally:  Yes Pulse Check Posterior Tibialis and Dorsalis pulse intact bilaterally:  Yes Comments    Results for orders placed or performed in visit on 02/26/17  POCT glucose (manual entry)  Result Value Ref Range   POC Glucose 94 70 - 99 mg/dl   A1C 5.6 Lab Results  Component Value Date   WBC 6.3 02/05/2017   HGB 11.3 (L) 02/05/2017   HCT 36.3 02/05/2017   MCV 87.3 02/05/2017   PLT 381 02/05/2017     Chemistry      Component Value Date/Time   NA 139 02/05/2017 0443   K 3.8 02/05/2017 0443   CL 108 02/05/2017 0443   CO2 24 02/05/2017 0443   BUN 10 02/05/2017 0443   CREATININE 0.45 02/05/2017 0443      Component Value Date/Time   CALCIUM 8.8 (L) 02/05/2017 0443   ALKPHOS 57 01/08/2017 0546   AST 22 01/08/2017 0546   ALT 36 01/08/2017 0546   BILITOT 1.4 (H) 01/08/2017 0546       .ASSESSMENT AND [name]: 1. Diabetes mellitus type 2 in obese (HCC) -at goal Given rxn for glucometer and supplies Will refer for eye exam once she has Medicaid - POCT glucose (manual entry) - Microalbumin / creatinine urine ratio - Blood Glucose Monitoring Suppl (TRUE METRIX METER) w/Device KIT; Use as directed  Dispense: 1 kit; Refill: 0 - glucose blood (TRUE METRIX BLOOD GLUCOSE TEST) test strip; Use as instructed  Dispense: 100 each; Refill: 12 - TRUEPLUS LANCETS 28G MISC; Use as  directed  Dispense: 100 each; Refill: 1  2. SAH (subarachnoid hemorrhage) ([place]) 3. Cerebrovascular accident (CVA), unspecified mechanism ([place]) -stable  Keep f/u appt with Neurology and PMR  4. Acute deep vein thrombosis (DVT) of both lower extremities, unspecified vein (HCC) -advise use of compression socks to LLE during the day -[name] for 3-6 mths of anticoag (end around Oct - Nov) - rivaroxaban (XARELTO) 20 MG TABS tablet; 20 mg daily beginning 02/20/2017  Dispense: 30 tablet; Refill: 1  5. Quadriparesis ([place]) -significantly improved.     Patient was given the opportunity to  ask questions.  Patient verbalized understanding of the [name] and was able to repeat key elements of the [name].   Orders Placed This Encounter  Procedures  . Microalbumin / creatinine urine ratio  . POCT glucose (manual entry)     Requested Prescriptions   Signed Prescriptions Disp Refills  . rivaroxaban (XARELTO) 20 MG TABS tablet 30 tablet 1    Sig: 20 mg daily beginning 02/20/2017  . Blood Glucose Monitoring Suppl (TRUE METRIX METER) w/Device KIT 1 kit 0    Sig: Use as directed  . glucose blood (TRUE METRIX BLOOD GLUCOSE TEST) test strip 100 each 12    Sig: Use as instructed  . TRUEPLUS LANCETS 28G MISC 100 each 1    Sig: Use as directed  . traMADol (ULTRAM) 50 MG tablet 30 tablet 0    Sig: Take 1 tablet (50 mg total) by mouth every 6 (six) hours as needed for moderate pain.    Return in about 6 weeks (around 04/09/2017) for PAP.  Karle Plumber, MD, FACP

## 2017-03-04 DIAGNOSIS — Z7984 Long term (current) use of oral hypoglycemic drugs: Secondary | ICD-10-CM

## 2017-03-04 DIAGNOSIS — I82412 Acute embolism and thrombosis of left femoral vein: Secondary | ICD-10-CM

## 2017-03-04 DIAGNOSIS — E119 Type 2 diabetes mellitus without complications: Secondary | ICD-10-CM

## 2017-03-04 DIAGNOSIS — I69365 Other paralytic syndrome following cerebral infarction, bilateral: Secondary | ICD-10-CM

## 2017-03-04 DIAGNOSIS — I82442 Acute embolism and thrombosis of left tibial vein: Secondary | ICD-10-CM

## 2017-03-04 DIAGNOSIS — G8250 Quadriplegia, unspecified: Secondary | ICD-10-CM

## 2017-03-04 DIAGNOSIS — I10 Essential (primary) hypertension: Secondary | ICD-10-CM

## 2017-03-04 DIAGNOSIS — D62 Acute posthemorrhagic anemia: Secondary | ICD-10-CM

## 2017-03-04 DIAGNOSIS — I82432 Acute embolism and thrombosis of left popliteal vein: Secondary | ICD-10-CM

## 2017-03-18 ENCOUNTER — Encounter: Payer: Self-pay | Admitting: Physical Medicine & Rehabilitation

## 2017-03-18 ENCOUNTER — Encounter: Payer: Self-pay | Attending: Physical Medicine & Rehabilitation | Admitting: Physical Medicine & Rehabilitation

## 2017-03-18 VITALS — BP 130/89 | HR 73

## 2017-03-18 DIAGNOSIS — I69398 Other sequelae of cerebral infarction: Secondary | ICD-10-CM

## 2017-03-18 DIAGNOSIS — I608 Other nontraumatic subarachnoid hemorrhage: Secondary | ICD-10-CM

## 2017-03-18 DIAGNOSIS — E119 Type 2 diabetes mellitus without complications: Secondary | ICD-10-CM

## 2017-03-18 DIAGNOSIS — G8250 Quadriplegia, unspecified: Secondary | ICD-10-CM | POA: Insufficient documentation

## 2017-03-18 DIAGNOSIS — R569 Unspecified convulsions: Secondary | ICD-10-CM | POA: Insufficient documentation

## 2017-03-18 DIAGNOSIS — I82403 Acute embolism and thrombosis of unspecified deep veins of lower extremity, bilateral: Secondary | ICD-10-CM

## 2017-03-18 DIAGNOSIS — Z7901 Long term (current) use of anticoagulants: Secondary | ICD-10-CM | POA: Insufficient documentation

## 2017-03-18 DIAGNOSIS — I69365 Other paralytic syndrome following cerebral infarction, bilateral: Secondary | ICD-10-CM

## 2017-03-18 DIAGNOSIS — I82412 Acute embolism and thrombosis of left femoral vein: Secondary | ICD-10-CM

## 2017-03-18 DIAGNOSIS — I10 Essential (primary) hypertension: Secondary | ICD-10-CM

## 2017-03-18 DIAGNOSIS — Z7984 Long term (current) use of oral hypoglycemic drugs: Secondary | ICD-10-CM

## 2017-03-18 DIAGNOSIS — I82442 Acute embolism and thrombosis of left tibial vein: Secondary | ICD-10-CM

## 2017-03-18 DIAGNOSIS — I82432 Acute embolism and thrombosis of left popliteal vein: Secondary | ICD-10-CM

## 2017-03-18 DIAGNOSIS — D62 Acute posthemorrhagic anemia: Secondary | ICD-10-CM

## 2017-03-18 DIAGNOSIS — R269 Unspecified abnormalities of gait and mobility: Secondary | ICD-10-CM

## 2017-03-18 DIAGNOSIS — I671 Cerebral aneurysm, nonruptured: Secondary | ICD-10-CM | POA: Insufficient documentation

## 2017-03-18 DIAGNOSIS — Z86718 Personal history of other venous thrombosis and embolism: Secondary | ICD-10-CM | POA: Insufficient documentation

## 2017-03-18 NOTE — Progress Notes (Signed)
Subjective:    Patient ID: Aimee [name], female    DOB: Dec 11, 1975, 41 y.o.   MRN: [id number]  HPI   Mrs. Bynum Bellows is here in follow up of her [place] and associated functional deficits. She finished [facility] therapy a couple weeks ago. She is walking without a device!! She hasn't had any falls and pain since being home. She is off tramadol and really doesn't even take tylenol at this time.  Her left calf pain has improved as well.  Her sugars have been under control.   She remains on xarelto for DVT rx. She's had no bleeding complications. She is also on keppra 500mg  BID. She has never had a seizure.   She would like to return to driving and to work. She wanted to discuss these topics with me today.   Pain Inventory Average Pain 0 Pain Right Now 0 My pain is na  In the last 24 hours, has pain interfered with the following? General activity 0 Relation with others 0 Enjoyment of life 0 What TIME of day is your pain at its worst? na Sleep (in general) Fair  Pain is worse with: na Pain improves with: na Relief from Meds: na  Mobility walk without assistance ability to climb steps?  yes do you drive?  no  Function employed # of hrs/week 40  Neuro/Psych No problems in this area  Prior Studies Any changes since last visit?  no  Physicians involved in your care Any changes since last visit?  no   No family history on file. Social History   Social History  . Marital status: Single    Spouse name: N/A  . Number of children: N/A  . Years of education: N/A   Social History Main Topics  . Smoking status: Never Smoker  . Smokeless tobacco: Never Used  . Alcohol use No  . Drug use: Unknown  . Sexual activity: Not on file   Other Topics Concern  . Not on file   Social History Narrative  . No narrative on file   Past Surgical History:  Procedure Laterality Date  . IR ANGIO INTRA EXTRACRAN SEL INTERNAL CAROTID BILAT MOD SED  12/25/2016  . IR ANGIO VERTEBRAL  SEL VERTEBRAL UNI L MOD SED  12/25/2016  . IR ANGIOGRAM FOLLOW UP STUDY  12/25/2016  . IR ANGIOGRAM FOLLOW UP STUDY  12/25/2016  . IR ANGIOGRAM FOLLOW UP STUDY  12/25/2016  . IR ANGIOGRAM FOLLOW UP STUDY  12/25/2016  . IR TRANSCATH/EMBOLIZ  12/25/2016  . RADIOLOGY WITH ANESTHESIA N/A 12/25/2016   Procedure: RADIOLOGY WITH ANESTHESIA;  Surgeon: Consuella Lose, MD;  Location: [place];  Service: Radiology;  Laterality: N/A;   Past Medical History:  Diagnosis Date  . Diabetes mellitus without complication ([place])   . Hypertension    There were no vitals taken for this visit.  Opioid Risk Score:   Fall Risk Score:  `1  Depression screen PHQ 2/9  Depression screen [facility] 2/9 02/26/2017 02/08/2017  Decreased Interest 0 0  Down, Depressed, Hopeless 0 0  PHQ - 2 Score 0 0  Altered sleeping 0 0  Tired, decreased energy 0 0  Change in appetite 0 0  Feeling bad or failure about yourself  0 0  Trouble concentrating 0 0  Moving slowly or fidgety/restless 0 0  PHQ-9 Score 0 0     Review of Systems  Constitutional: Negative.   HENT: Negative.   Eyes: Negative.   Respiratory: Negative.   Cardiovascular: Negative.  Gastrointestinal: Negative.   Endocrine: Negative.   Genitourinary: Negative.   Musculoskeletal: Negative.   Skin: Negative.   Allergic/Immunologic: Negative.   Neurological: Negative.   Hematological: Negative.   Psychiatric/Behavioral: Negative.   All other systems reviewed and are negative.      Objective:   Physical Exam  Constitutional: She appears well-developed. NAD. HENT: Normocephalic and atraumatic.  Eyes: EOMI. No discharge.  Cardiovascular: RRR Respiratory: CTA  GI: soft, BS+  Musculoskeletal:  She exhibits no edema, mild calf tenderness on left Neurological: Alert and oriented Motor: RUE:5/5  LUE: 5/5  RLE: 5/5 right hip flexion, KE, 5/5 ADF/PF LLE:  5/5 proximal to 5/5 distal  Skin: Skin is warm and dry. Intact Psychiatric: She has a normal mood and  affect. Her behavior is normal.        Assessment & [name]:  1. 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   -can discuss with supervisor at work regarding return.    -would advise part time work initially ([place]) 2. DVT Prophylaxis/Anticoagulation:              Follow-up Doppler 01/30/2017 shows propagation of left calf DVT into the popliteal and femoral veins on the left side. Also, new right tibioperoneal trunk and distal tibial DVT, started on Xarelto 01/30/17  -check dopplers again in about two months---continue xarelto for now 3. Pain Management: tylenol prn 4. Seizure prophylaxis. Keppra    -  wean to 250mg  bid until bottle is empty.  9. Diabetes mellitus. Per primary   Fifteen minutes of face to face patient care time were spent during this visit. All questions were encouraged and answered.  Follow up with me in 2 months.

## 2017-03-18 NOTE — Patient Instructions (Addendum)
RETURN TO DRIVING PLAN:  WITH THE SUPERVISION OF A LICENSED DRIVER, PLEASE DRIVE IN AN EMPTY PARKING LOT FOR AT LEAST 2-3 TRIALS TO TEST REACTION TIME, VISION, USE OF EQUIPMENT IN CAR, ETC.  IF SUCCESSFUL WITH THE PARKING LOT DRIVING, PROCEED TO SUPERVISED DRIVING TRIALS IN YOUR NEIGHBORHOOD STREETS AT LOW TRAFFIC TIMES TO TEST OBSERVATION TO TRAFFIC SIGNALS, REACTION TIME, ETC. PLEASE ATTEMPT AT LEAST 2-3 TRIALS IN YOUR NEIGHBORHOOD.  IF NEIGHBORHOOD DRIVING IS SUCCESSFUL, YOU MAY PROCEED TO DRIVING IN BUSIER AREAS IN YOUR COMMUNITY WITH SUPERVISION OF A LICENSED DRIVER. PLEASE ATTEMPT AT LEAST 4-5 TRIALS.  IF COMMUNITY DRIVING IS SUCCESSFUL, YOU MAY PROCEED TO DRIVING ALONE     WORK: DISCUSS WITH YOUR BOSS ABOUT RETURN TO WORK. I WOULD ADVISE PART TIME (4 HOUR DAYS) FOR THE FIRST 1-2 WEEKS BEFORE YOU RESUME FULL TIME.   I GIVE YOU PERMISSION TO RETURN TO WORK   KEPPRA: CUT IN HALF (TO 250MG ) AND TAKE TWICE DAILY UNTIL PILLS ARE GONE

## 2017-03-28 NOTE — Addendum Note (Signed)
Addendum  created 03/28/17 1456 by Roberts Gaudy, MD   Sign clinical note

## 2017-04-10 ENCOUNTER — Encounter: Payer: Self-pay | Admitting: Neurology

## 2017-04-10 ENCOUNTER — Ambulatory Visit (INDEPENDENT_AMBULATORY_CARE_PROVIDER_SITE_OTHER): Payer: Self-pay | Admitting: Neurology

## 2017-04-10 VITALS — BP 135/90 | HR 74 | Ht 64.0 in | Wt 197.6 lb

## 2017-04-10 DIAGNOSIS — I608 Other nontraumatic subarachnoid hemorrhage: Secondary | ICD-10-CM

## 2017-04-10 DIAGNOSIS — I82403 Acute embolism and thrombosis of unspecified deep veins of lower extremity, bilateral: Secondary | ICD-10-CM

## 2017-04-10 DIAGNOSIS — I10 Essential (primary) hypertension: Secondary | ICD-10-CM

## 2017-04-10 DIAGNOSIS — E119 Type 2 diabetes mellitus without complications: Secondary | ICD-10-CM

## 2017-04-10 NOTE — Patient Instructions (Addendum)
-   continue Xarelto for now and follow up with Dr. Naaman Plummer for duration - continue taper off keppra as instructed - need to setup appointment with Dr. Etta Quill for aneurysm follow up - check BP and glucose at home - Follow up with your primary care physician for stroke risk factor modification. Recommend maintain blood pressure goal <130/80, diabetes with hemoglobin A1c goal below 7.0% and lipids with LDL cholesterol goal below 70 mg/dL.  - diabetic diet and regular exercises - follow up as needed.

## 2017-04-11 DIAGNOSIS — I82403 Acute embolism and thrombosis of unspecified deep veins of lower extremity, bilateral: Secondary | ICD-10-CM | POA: Insufficient documentation

## 2017-04-11 DIAGNOSIS — E119 Type 2 diabetes mellitus without complications: Secondary | ICD-10-CM | POA: Insufficient documentation

## 2017-04-11 NOTE — Progress Notes (Signed)
STROKE NEUROLOGY FOLLOW UP NOTE  NAME: Aimee [name] DOB: [birth year]  REASON FOR VISIT: stroke follow up HISTORY FROM: pt and chart  Today we had the pleasure of seeing Aimee [name] in follow-up at our Neurology Clinic. Pt was accompanied by no one.   History Summary [name] is a 41 y.o. female with history of hypertension and diabetes mellitus  admitted on 12/23/16 for tetraplegia and altered mental status. CT head showed diffuse basal cistern SAH. 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 CTA neck normal. Aneurysm coiled by Aimee. Etta [name]. TCD showed increased velocity and right MCA and ACA but cerebral angiogram showed no vasospasm. TTE EF 55-60%. BP maintained < 160 and on Keppra for seizure prophylaxis. LP showed xanthochromia, no signs of infection. 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 Repeat TSH showed successful coil embolization of right PCOM aneurysm. Patient tetraplegia slowly getting improved, and extubated, and eventually discharged to CIR.  Interval History During the interval time, patient stated in CIR for therapy for a month had the dramatic recovery. Had bilateral DVT and put on Xarelto. Currently neurological intact, no neurological deficit. Follow with Aimee. Naaman [name] in clinic, in the process of tapering off Keppra. Still on Xarelto but [name] to finish off the course soon. Has not seen Aimee. Etta [name] yet. BP stable 135/90 today. Glucose in good control.  REVIEW OF SYSTEMS: Full 14 system review of systems performed and notable only for those listed below and in HPI above, all others are negative:  Constitutional:   Cardiovascular: Swelling in legs  Ear/Nose/Throat:   ringing in ears  Skin:  Eyes:   Respiratory:   cough  Gastroitestinal:   Genitourinary:  Hematology/Lymphatic:   Endocrine:  Musculoskeletal:   joint pain, aching muscles    Allergy/Immunology:   Neurological:   headache, numbness  Psychiatric:  Sleep:   The following represents the patient's updated allergies and side effects list: No Known Allergies  The neurologically relevant items on the patient's problem list were reviewed on today's visit.  Neurologic Examination  A problem focused neurological exam (12 or more points of the single system neurologic examination, vital signs counts as 1 point, cranial nerves count for 8 points) was performed.  Blood pressure 135/90, pulse 74, height 5\' 4"  (1.626 m), weight 197 lb 9.6 oz (89.6 kg).  General - Well nourished, well developed, in no apparent distress.  Ophthalmologic - Sharp disc margins OU.   Cardiovascular - Regular rate and rhythm with no murmur.  Mental Status -  Level of arousal and orientation to time, place, and person were intact. Language including expression, naming, repetition, comprehension was assessed and found intact. Fund of Knowledge was assessed and was intact.  Cranial Nerves II - XII - II - Visual field intact OU. III, IV, VI - Extraocular movements intact. V - Facial sensation intact bilaterally. VII - Facial movement intact bilaterally. VIII - Hearing & vestibular intact bilaterally. X - Palate elevates symmetrically. XI - Chin turning & shoulder shrug intact bilaterally. XII - Tongue protrusion intact.  Motor Strength - The patient's strength was normal in all extremities and pronator drift was absent.  Bulk was normal and fasciculations were absent.   Motor Tone - Muscle tone was assessed at the neck and appendages and was normal.  Reflexes - The patient's reflexes were 1+ in all extremities and she had no pathological reflexes.  Sensory - Light touch, temperature/pinprick,  vibration and proprioception, and Romberg testing were assessed and were normal.    Coordination - The patient had normal movements in the hands and feet with no ataxia or dysmetria.  Tremor was  absent.  Gait and Station - The patient's transfers, posture, gait, station, and turns were observed as normal.   Functional score  mRS = 0   0 - No symptoms.   1 - No significant disability. Able to carry out all usual activities, despite some symptoms.   2 - Slight disability. Able to look after own affairs without assistance, but unable to carry out all previous activities.   3 - Moderate disability. Requires some help, but able to walk unassisted.   4 - Moderately severe disability. Unable to attend to own bodily needs without assistance, and unable to walk unassisted.   5 - Severe disability. Requires constant nursing care and attention, bedridden, incontinent.   6 - Dead.   NIH Stroke Scale = 0   Data reviewed: I personally reviewed the images and agree with the radiology interpretations.  Ct Angio Head W Or Wo Contrast Ct Angio Neck W And/or Wo Contrast 12/25/2016 CTA NECK IMPRESSION:  Normal CTA of the neck.  CTA HEAD IMPRESSION:  1. 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 Aneurysm is directed posteriorly, and demonstrates a fairly narrow neck. Neuro endovascular consultation is recommended.  2. Diffuse irregularity throughout the remaining intracranial arterial vasculature, suspicious for vasospasm secondary to acute subarachnoid hemorrhage.   Cerbral Angiogram / Coiling - Aimee [name] 12/25/2016 1. 7.5 x 6.16mm right Pcom aneurysm projecting posteriorly, coiled successfully without aneurysm residual. 2. No significant vasospasm seen 3. No other intracranial aneurysms, AVM, or fistulas seen  Ct Head Wo Contrast 12/25/2016 Diffuse subarachnoid hemorrhage, filling CSF spaces in the suprasellar and basal cisterns as well as bilateral sulci. Cause is not determined but presents high suspicion for ruptured intracranial aneurysm.   TTE - Left ventricle: The cavity size was normal. Systolic function was normal. The estimated ejection fraction  was in the range of 55% to 60%. Wall motion was normal; there were no regional wall motion abnormalities. Doppler parameters are consistent with abnormal left ventricular relaxation (grade 1 diastolic dysfunction). Acoustic contrast opacification revealed no evidence ofthrombus.  Mr Brain and C-spine Wo Contrast 12/26/2016 IMPRESSION: 1. 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 More confluent acute infarcts in the high frontoparietal regions bilaterally. 2. Subarachnoid and small volume intraventricular hemorrhage. 3. Mild cervical spondylosis.  No spinal stenosis. 4. Unremarkable appearance of the cervical spinal cord.   MRI brain and C spine   12/30/2016 1. Overall similar appearance of scattered bilateral embolic infarcts and confluent cortical infarcts at the vertex bilaterally. 2. No new infarcts. 3. Decreasing subarachnoid and intraventricular hemorrhage. 4. Unremarkable appearance of the cervical spinal cord  Transcranial Doppler  Date POD PCO2 HCT BP  MCA ACA PCA OPHT SIPH VERT Basilar  5/23 rs     Right  Left   76  44   -52  -48   42  31   27  41   29  48   *  -37   -35      5/25 je     Right  Left   103  118   -95  -94   58  49   37  48   51  72   -35  *   -34  5/28 je     Right  Left   101  113   -53  -68   34  -31   54  00   86  57   -42  *   *     LE venous doppler 01/30/17 - Incidental findings are consistent with: enlarged lymph node on   the left. - Findings consistent with acute deep vein thrombosis involving the   tibioperoneal trunk, the right posterior tibial, peroneal and   gastrocnemius veins of the right lower extremity. - Findings consistent with mixed age deep vein thrombosis involving   the left lower extremity. Acute deep vein thrombosis is noted in   the proximal and mid femoral vein appearing more chronic in the   distal areas.  Indeterminate age thrombosis in the left popliteal,   posterior tibial, and peroneal veins of the left lower extremity.  Component     Latest Ref Rng & Units 12/27/2016 12/27/2016         8:02 AM  9:30 AM  Tube #       4  Color, CSF     COLORLESS  BLOODY (A)  Appearance, CSF     CLEAR  TURBID (A)  Supernatant       XANTHOCHROMIC  RBC Count, CSF     0 /cu mm  34,750 (H)  WBC, CSF     0 - 5 /cu mm  12 (HH)  Segmented Neutrophils-CSF     0 - 6 %  20 (H)  Lymphs, CSF     40 - 80 %  74  Monocyte-Macrophage-Spinal Fluid     15 - 45 %  3 (L)  Eosinophils, CSF     0 - 1 %  3 (H)  Specimen Description         Special Requests         Gram Stain         Culture         Report Status         Specimen source hsv       CSF  HSV 1 DNA     Negative  Negative  HSV 2 DNA     Negative  Negative  Glucose, CSF     40 - 70 mg/dL  107 (H)  Total  Protein, CSF     15 - 45 mg/dL  31  HIV Screen 4th Generation wRfx     Non Reactive Non Reactive   RPR     Non Reactive Non Reactive   VDRL Quant, CSF     Non Rea:<1:1  Non Reactive  Path Review       PERIPHERAL BLOOD   Component     Latest Ref Rng & Units 12/27/2016         9:30 AM  Tube #      1  Color, CSF     COLORLESS BLOODY (A)  Appearance, CSF     CLEAR TURBID (A)  Supernatant      XANTHOCHROMIC  RBC Count, CSF     0 /cu mm 32,250 (H)  WBC, CSF     0 - 5 /cu mm 14 (HH)  Segmented Neutrophils-CSF     0 - 6 % 30 (H)  Lymphs, CSF     40 - 80 % 63  Monocyte-Macrophage-Spinal Fluid     15 - 45 % 6 (L)  Eosinophils, CSF     0 - 1 %  1  Specimen Description        Special Requests        Gram Stain        Culture        Report Status        Specimen source hsv        HSV 1 DNA     Negative   HSV 2 DNA     Negative   Glucose, CSF     40 - 70 mg/dL   Total  Protein, CSF     15 - 45 mg/dL   HIV Screen 4th Generation wRfx     Non Reactive   RPR     Non Reactive   VDRL Quant, CSF     Non Rea:<1:1     Path Review          Assessment: As you may recall, she is a 41 y.o. Hispanic female with PMH of HTN and DM admitted on 12/23/16 for tetraplegia and altered mental status. CT head showed diffuse basal cistern SAH. 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 CTA neck normal. Aneurysm coiled by Aimee. Etta [name]. Cerebral angiogram showed no vasospasm. TTE EF 55-60%. LP showed xanthochromia, no signs of infection. 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 Repeat DSA showed successful coil embolization of right PCOM aneurysm. Had dramatic recovery in CIR but had bilateral DVT and put on Xarelto. Currently neurological intact, no neurological deficit. Follow with Aimee. Naaman [name] in clinic, in the process of tapering off Keppra. Still on Xarelto but [name] to finish off the course soon. Has not seen Aimee. Etta [name] yet.   [name]:  - continue Xarelto and follow up with Aimee. Naaman [name] for duration - continue taper off keppra as instructed - need to setup appointment with Aimee. Etta [name] for aneurysm follow up - check BP and glucose at home - Follow up with your primary care physician for stroke risk factor modification. Recommend maintain blood pressure goal <130/80, diabetes with hemoglobin A1c goal below 7.0% and lipids with LDL cholesterol goal below 70 mg/dL.  - diabetic diet and regular exercises - follow up as needed.   I spent more than 25 minutes of face to face time with the patient. Greater than 50% of time was spent in counseling and coordination of care.    Orders Placed This Encounter  Procedures  . Ambulatory referral to Neurosurgery    Referral Priority:   Routine    Referral Type:   Surgical    Referral Reason:   Specialty Services Required    Referred to Provider:   Consuella Lose, MD    Requested Specialty:   Neurosurgery    Number of Visits Requested:   1    Meds ordered this encounter  Medications  .  traMADol (ULTRAM) 50 MG tablet    Sig: Take by mouth every 6 (six) hours as needed.    Patient Instructions  - continue Xarelto for now and follow up with Aimee. Naaman [name] for duration - continue taper off keppra as instructed - need to setup appointment with Aimee. Etta [name] for aneurysm follow up - check BP and glucose at home - Follow up with your primary care physician for stroke risk factor modification. Recommend maintain blood pressure goal <130/80, diabetes with hemoglobin A1c goal below 7.0% and lipids with LDL cholesterol goal below 70 mg/dL.  - diabetic diet and regular exercises - follow up as  needed.    Rosalin Hawking, MD PhD [facility] [address] [place], [place] [address] [phone]

## 2017-04-23 ENCOUNTER — Ambulatory Visit: Payer: Self-pay | Attending: Internal Medicine | Admitting: Internal Medicine

## 2017-04-23 ENCOUNTER — Encounter: Payer: Self-pay | Admitting: Internal Medicine

## 2017-04-23 VITALS — BP 130/88 | HR 80 | Temp 98.6°F | Resp 18 | Ht 64.0 in | Wt 196.2 lb

## 2017-04-23 DIAGNOSIS — G8250 Quadriplegia, unspecified: Secondary | ICD-10-CM | POA: Insufficient documentation

## 2017-04-23 DIAGNOSIS — Z23 Encounter for immunization: Secondary | ICD-10-CM

## 2017-04-23 DIAGNOSIS — Z86718 Personal history of other venous thrombosis and embolism: Secondary | ICD-10-CM | POA: Insufficient documentation

## 2017-04-23 DIAGNOSIS — Z1239 Encounter for other screening for malignant neoplasm of breast: Secondary | ICD-10-CM

## 2017-04-23 DIAGNOSIS — R569 Unspecified convulsions: Secondary | ICD-10-CM | POA: Insufficient documentation

## 2017-04-23 DIAGNOSIS — Z1231 Encounter for screening mammogram for malignant neoplasm of breast: Secondary | ICD-10-CM

## 2017-04-23 DIAGNOSIS — Z6830 Body mass index (BMI) 30.0-30.9, adult: Secondary | ICD-10-CM | POA: Insufficient documentation

## 2017-04-23 DIAGNOSIS — I609 Nontraumatic subarachnoid hemorrhage, unspecified: Secondary | ICD-10-CM | POA: Insufficient documentation

## 2017-04-23 DIAGNOSIS — Z7901 Long term (current) use of anticoagulants: Secondary | ICD-10-CM | POA: Insufficient documentation

## 2017-04-23 DIAGNOSIS — L659 Nonscarring hair loss, unspecified: Secondary | ICD-10-CM

## 2017-04-23 DIAGNOSIS — D62 Acute posthemorrhagic anemia: Secondary | ICD-10-CM | POA: Insufficient documentation

## 2017-04-23 DIAGNOSIS — E119 Type 2 diabetes mellitus without complications: Secondary | ICD-10-CM | POA: Insufficient documentation

## 2017-04-23 DIAGNOSIS — Z7984 Long term (current) use of oral hypoglycemic drugs: Secondary | ICD-10-CM | POA: Insufficient documentation

## 2017-04-23 DIAGNOSIS — Z124 Encounter for screening for malignant neoplasm of cervix: Secondary | ICD-10-CM

## 2017-04-23 DIAGNOSIS — E669 Obesity, unspecified: Secondary | ICD-10-CM | POA: Insufficient documentation

## 2017-04-23 DIAGNOSIS — I10 Essential (primary) hypertension: Secondary | ICD-10-CM | POA: Insufficient documentation

## 2017-04-23 LAB — POCT GLYCOSYLATED HEMOGLOBIN (HGB A1C): Hemoglobin A1C: 5.7

## 2017-04-23 LAB — GLUCOSE, POCT (MANUAL RESULT ENTRY): POC Glucose: 112 mg/dL — AB (ref 70–99)

## 2017-04-23 NOTE — Progress Notes (Signed)
Patient ID: Aimee [name], female    DOB: May 15, 1976  MRN: [id number]  CC: No chief complaint on file.   Subjective: Aimee [name] is a 41 y.o. female who presents for PAP Her concerns today include:  41 year old FM with history of diabetes type 2, HTN, recent SAH (12/25/2016)with PCA aneurysm status post coiling, improving quadriparesis, LLE and RLE  (f/u doppler 01/30/2017 LT popliteal and femoral veins and new RT tibioperoneal trunk and distal tibialDVT on Xarelto.   GYN: -no pregnancy in past.  -no abnormal paps in past. Last one was about 5 yrs ago -endorses some vaginal itching.  Some dischg after periods -menses regular. 1st day heavy since being on blood thinner, then regular; last 4-5 days.   -not sexually active; been > 5 yrs. -would like MMG. No fhx of breast CA.  Never had MMG. Does regular self breast exams  DM: checks BS 3 x a wk. Range 84-94 before meals -trying to limit portion sizes Walking daily for 20 mins.  Went back to work yesterday. Does house keeping at a [employer]  Hx of [facility] with coil of aneurysm -being weaned off Keppra by Dr Naaman Plummer  Hair loss x 1 mth.  She wonders whether Xarelto or Keppra can cause it. Denies any feeling of fatigue or being hot or cold all the time. -No new hair products. She does not perm or color or hair  HM: needs eye exam. Unable to afford just yet.  Needs flu shot and Tdap  Patient Active Problem List   Diagnosis Date Noted  . Type 2 diabetes mellitus without complication, without long-term current use of insulin ([place]) 04/11/2017  . Acute deep vein thrombosis (DVT) of both lower extremities ([place]) 04/11/2017  . Acute blood loss anemia   . Deep vein thrombosis (DVT) ([place])   . Gait disturbance, post-stroke 01/24/2017  . Reactive hypertension   . Quadriparesis ([place])   . Class 1 obesity due to excess calories with serious comorbidity and body mass index (BMI) of 30.0 to 30.9 in adult   .  Essential hypertension   . Diabetes mellitus type 2 in obese ([place])   . Seizure ([place])   . Subarachnoid hemorrhage due to ruptured aneurysm ([place]) 12/25/2016     Current Outpatient Prescriptions on File Prior to Visit  Medication Sig Dispense Refill  . levETIRAcetam (KEPPRA) 500 MG tablet Take 1 tablet (500 mg total) by mouth 2 (two) times daily. 60 tablet 3  . metFORMIN (GLUCOPHAGE) 500 MG tablet Take 1 tablet (500 mg total) by mouth daily with breakfast. 30 tablet 3  . pantoprazole (PROTONIX) 40 MG tablet Take 1 tablet (40 mg total) by mouth daily before breakfast. 30 tablet 3  . Blood Glucose Monitoring Suppl (TRUE METRIX METER) w/Device KIT Use as directed 1 kit 0  . docusate sodium (COLACE) 100 MG capsule Take 1 capsule (100 mg total) by mouth daily. (Patient not taking: Reported on 04/23/2017) 10 capsule 0  . glucose blood (TRUE METRIX BLOOD GLUCOSE TEST) test strip Use as instructed 100 each 12  . rivaroxaban (XARELTO) 20 MG TABS tablet 20 mg daily beginning 02/20/2017 30 tablet 1  . traMADol (ULTRAM) 50 MG tablet Take by mouth every 6 (six) hours as needed.     No current facility-administered medications on file prior to visit.     No Known Allergies  Social History   Social History  . Marital status: Single    Spouse name: N/A  . Number of  children: N/A  . Years of education: N/A   Occupational History  . Not on file.   Social History Main Topics  . Smoking status: Never Smoker  . Smokeless tobacco: Never Used  . Alcohol use No  . Drug use: No  . Sexual activity: Not on file   Other Topics Concern  . Not on file   Social History Narrative  . No narrative on file    Family History  Problem Relation Age of Onset  . Stroke Mother     Past Surgical History:  Procedure Laterality Date  . IR ANGIO INTRA EXTRACRAN SEL INTERNAL CAROTID BILAT MOD SED  12/25/2016  . IR ANGIO VERTEBRAL SEL VERTEBRAL UNI L MOD SED  12/25/2016  . IR ANGIOGRAM FOLLOW UP STUDY  12/25/2016    . IR ANGIOGRAM FOLLOW UP STUDY  12/25/2016  . IR ANGIOGRAM FOLLOW UP STUDY  12/25/2016  . IR ANGIOGRAM FOLLOW UP STUDY  12/25/2016  . IR TRANSCATH/EMBOLIZ  12/25/2016  . RADIOLOGY WITH ANESTHESIA N/A 12/25/2016   Procedure: RADIOLOGY WITH ANESTHESIA;  Surgeon: Consuella Lose, MD;  Location: [facility];  Service: Radiology;  Laterality: N/A;    ROS: Review of Systems Negative except as stated above PHYSICAL EXAM: BP 130/88 (BP Location: Left Arm, Patient Position: Sitting, Cuff Size: Normal)   Pulse 80   Temp 98.6 F (37 C) (Oral)   Resp 18   Ht '5\' 4"'  (1.626 m)   Wt 196 lb 3.2 oz (89 kg)   LMP 04/05/2017 (Exact Date)   SpO2 95%   BMI 33.68 kg/m   Wt Readings from Last 3 Encounters:  04/23/17 196 lb 3.2 oz (89 kg)  04/10/17 197 lb 9.6 oz (89.6 kg)  02/26/17 198 lb (89.8 kg)    Physical Exam  General appearance - alert, well appearing, and in no distress Mental status - alert, oriented to person, place, and time, normal mood, behavior, speech, dress, motor activity, and thought processes Breasts - breasts appear normal, no suspicious masses, no skin or nipple changes or axillary nodes 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 normal external genitalia, vulva, vagina, cervix, uterus and adnexa. Small amount of old blood to LT of cervix. Patient reports having menstrual cycle last week Hair: mild to mod thinning on frontal crown. + hair comes out with mild pulling on shaft  Results for orders placed or performed in visit on 04/23/17  Glucose (CBG)  Result Value Ref Range   POC Glucose 112 (A) 70 - 99 mg/dl  HgB A1c  Result Value Ref Range   Hemoglobin A1C 5.7     ASSESSMENT AND [name]: 1. Pap smear for cervical cancer screening - Cytology - PAP Patient declines STI screening  2. Type 2 diabetes mellitus without complication, without long-term current use of insulin (HCC) -At goal. Continue metformin. Continue to encourage healthy eating habits. She is moving more now that  she is back at work Encourage her to try and get [facility] exam [facility] or [facility] - Glucose (CBG) - HgB A1c - Microalbumin / creatinine urine ratio  3. SAH (subarachnoid hemorrhage) (HCC) S/p coil procedure Being weaned off of [place].   4. Hair loss -Keppra can cause alopecia as a minor side effects. Thankfully she is being weaned off.  Sees Dr. Naaman Plummer next mth and she thinks it will be stopped then - TSH  5. Need for influenza vaccination - Flu Vaccine QUAD 6+ mos PF IM (Fluarix Quad PF)  6. Breast cancer screening -  MM Digital Screening; Future  PMR plans to recheck doppler next mth.  Should be able to stop Xarelto end of October.  Patient was given the opportunity to ask questions.  Patient verbalized understanding of the [name] and was able to repeat key elements of the [name].   Orders Placed This Encounter  Procedures  . MM Digital Screening  . Flu Vaccine QUAD 6+ mos PF IM (Fluarix Quad PF)  . Microalbumin / creatinine urine ratio  . TSH  . Glucose (CBG)  . HgB A1c     Requested Prescriptions    No prescriptions requested or ordered in this encounter    Return in about 10 weeks (around 07/02/2017).  Karle Plumber, MD, FACP

## 2017-04-24 ENCOUNTER — Telehealth: Payer: Self-pay

## 2017-04-24 LAB — CYTOLOGY - PAP
Diagnosis: NEGATIVE
HPV: NOT DETECTED

## 2017-04-24 LAB — MICROALBUMIN / CREATININE URINE RATIO
Creatinine, Urine: 40.2 mg/dL
Microalb/Creat Ratio: 10.4 mg/g{creat} (ref 0.0–30.0)
Microalbumin, Urine: 4.2 ug/mL

## 2017-04-24 LAB — TSH: TSH: 1.77 u[IU]/mL (ref 0.450–4.500)

## 2017-04-24 NOTE — Telephone Encounter (Signed)
Contacted pt to go over lab results pt didn't answer was unable to lvm

## 2017-04-25 ENCOUNTER — Encounter: Payer: Self-pay | Admitting: Internal Medicine

## 2017-04-25 LAB — HM DIABETES EYE EXAM

## 2017-05-15 ENCOUNTER — Other Ambulatory Visit: Payer: Self-pay | Admitting: Physical Medicine & Rehabilitation

## 2017-05-15 ENCOUNTER — Telehealth: Payer: Self-pay | Admitting: *Deleted

## 2017-05-15 ENCOUNTER — Encounter: Payer: Self-pay | Attending: Physical Medicine & Rehabilitation | Admitting: Physical Medicine & Rehabilitation

## 2017-05-15 ENCOUNTER — Encounter: Payer: Self-pay | Admitting: Physical Medicine & Rehabilitation

## 2017-05-15 ENCOUNTER — Ambulatory Visit (HOSPITAL_COMMUNITY)
Admission: RE | Admit: 2017-05-15 | Discharge: 2017-05-15 | Disposition: A | Discharge status: Home / Self Care | Payer: Self-pay | Source: Ambulatory Visit | Attending: Physical Medicine & Rehabilitation | Admitting: Physical Medicine & Rehabilitation

## 2017-05-15 VITALS — BP 137/93 | HR 78

## 2017-05-15 DIAGNOSIS — R609 Edema, unspecified: Secondary | ICD-10-CM

## 2017-05-15 DIAGNOSIS — I82532 Chronic embolism and thrombosis of left popliteal vein: Secondary | ICD-10-CM | POA: Insufficient documentation

## 2017-05-15 DIAGNOSIS — I608 Other nontraumatic subarachnoid hemorrhage: Secondary | ICD-10-CM

## 2017-05-15 DIAGNOSIS — G8250 Quadriplegia, unspecified: Secondary | ICD-10-CM

## 2017-05-15 DIAGNOSIS — R569 Unspecified convulsions: Secondary | ICD-10-CM | POA: Insufficient documentation

## 2017-05-15 DIAGNOSIS — Z86718 Personal history of other venous thrombosis and embolism: Secondary | ICD-10-CM | POA: Insufficient documentation

## 2017-05-15 DIAGNOSIS — Z7901 Long term (current) use of anticoagulants: Secondary | ICD-10-CM | POA: Insufficient documentation

## 2017-05-15 DIAGNOSIS — I824Y3 Acute embolism and thrombosis of unspecified deep veins of proximal lower extremity, bilateral: Secondary | ICD-10-CM

## 2017-05-15 DIAGNOSIS — I671 Cerebral aneurysm, nonruptured: Secondary | ICD-10-CM | POA: Insufficient documentation

## 2017-05-15 DIAGNOSIS — E119 Type 2 diabetes mellitus without complications: Secondary | ICD-10-CM | POA: Insufficient documentation

## 2017-05-15 NOTE — Telephone Encounter (Signed)
contacted patient and informed that her doppler test was clear and that Dr. Charm Barges  Instructions are to stop Xarelto once medication bottle is complete

## 2017-05-15 NOTE — Progress Notes (Signed)
*  PRELIMINARY RESULTS* Vascular Ultrasound Bilateral lower extremity venous duplex has been completed.  Preliminary findings: No evidence of acute deep vein thrombosis or baker's cysts bilaterally.  Previously seen DVT appears to be resolved at this time with only minor wall thickening/chronic changes noted in the left popliteal vein.  Preliminary results called to Dr. Naaman Plummer @ 16:06   [name] 05/15/2017, 4:03 PM

## 2017-05-15 NOTE — Patient Instructions (Signed)
FOLLOW SHOULDER EXERCISES PROVIDED.

## 2017-05-15 NOTE — Progress Notes (Signed)
Subjective:    Patient ID: Aimee [name], female    DOB: February 18, 1976, 41 y.o.   MRN: [id number]  HPI   Mrs Duwayne Heck is here in folow up of her right PCOM aneurysm. She has made continued gains and is walking faster and is able to use the stairs without holding the railing. She denies any falls.   She is working up to 7 hours per day now. She worked 35 hours last week. She feels quite fatigued by the end of the day.   Her right shoulder is bothering her with pain when she lifts her arm over her head and when she is sleeping on the arm at night. She also feels generalized joint pain when she is on her feet at work by the end of the day.   She is off the keppra. Remains on xareltor for her DVT's   Pain Inventory Average Pain 5 Pain Right Now 4 My pain is constant and sharp  In the last 24 hours, has pain interfered with the following? General activity 4 Relation with others 0 Enjoyment of life 0 What TIME of day is your pain at its worst? morning Sleep (in general) Good  Pain is worse with: inactivity and standing Pain improves with: rest and heat/ice Relief from Meds: 5  Mobility walk without assistance ability to climb steps?  yes do you drive?  yes  Function employed # of hrs/week 40  Neuro/Psych numbness  Prior Studies Any changes since last visit?  no  Physicians involved in your care Any changes since last visit?  no   Family History  Problem Relation Age of Onset  . Stroke Mother    Social History   Social History  . Marital status: Single    Spouse name: N/A  . Number of children: N/A  . Years of education: N/A   Social History Main Topics  . Smoking status: Never Smoker  . Smokeless tobacco: Never Used  . Alcohol use No  . Drug use: No  . Sexual activity: Not on file   Other Topics Concern  . Not on file   Social History Narrative  . No narrative on file   Past Surgical History:  Procedure Laterality Date  . IR ANGIO INTRA  EXTRACRAN SEL INTERNAL CAROTID BILAT MOD SED  12/25/2016  . IR ANGIO VERTEBRAL SEL VERTEBRAL UNI L MOD SED  12/25/2016  . IR ANGIOGRAM FOLLOW UP STUDY  12/25/2016  . IR ANGIOGRAM FOLLOW UP STUDY  12/25/2016  . IR ANGIOGRAM FOLLOW UP STUDY  12/25/2016  . IR ANGIOGRAM FOLLOW UP STUDY  12/25/2016  . IR TRANSCATH/EMBOLIZ  12/25/2016  . RADIOLOGY WITH ANESTHESIA N/A 12/25/2016   Procedure: RADIOLOGY WITH ANESTHESIA;  Surgeon: Consuella Lose, MD;  Location: [facility];  Service: Radiology;  Laterality: N/A;   Past Medical History:  Diagnosis Date  . Diabetes mellitus without complication ([place])   . Hypertension   . Stroke [facility])    There were no vitals taken for this visit.  Opioid Risk Score:   Fall Risk Score:  `1  Depression screen PHQ 2/9  Depression screen [facility] 2/9 04/23/2017 03/18/2017 02/26/2017 02/08/2017  Decreased Interest 0 0 0 0  Down, Depressed, Hopeless 0 0 0 0  PHQ - 2 Score 0 0 0 0  Altered sleeping 0 0 0 0  Tired, decreased energy 0 0 0 0  Change in appetite 0 0 0 0  Feeling bad or failure about yourself  0 0 0 0  Trouble concentrating 0 0 0 0  Moving slowly or fidgety/restless 0 0 0 0  Suicidal thoughts 0 - - -  PHQ-9 Score 0 0 0 0     Review of Systems  Constitutional: Positive for unexpected weight change.  HENT: Negative.   Eyes: Negative.   Respiratory: Negative.   Cardiovascular: Negative.   Gastrointestinal: Negative.   Endocrine: Negative.   Genitourinary: Negative.   Musculoskeletal: Negative.   Skin: Negative.   Allergic/Immunologic: Negative.   Neurological: Negative.   Hematological: Negative.   Psychiatric/Behavioral: Negative.   All other systems reviewed and are negative.      Objective:   Physical Exam  Constitutional: She appears well-developed. NAD. HENT: Normocephalic and atraumatic.  Eyes: EOMI. No discharge.  Cardiovascular: RRR Respiratory: CTA B  GI: soft, BS+  Musculoskeletal: right SHOULDER impingement maneuvers positive with  radiation of pain down lateral shoulder. Strength relatively preserved. No anterior shoulder pain  Neurological: Alert and oriented Motor: RUE:5/5  LUE: 5/5  RLE: 5/5 right hip flexion, KE, 5/5 ADF/PF LLE: 5/5 proximal to 5/5 distal  Skin: Skin is warm and dry. Intact Psychiatric: She has a normal mood and affect. Her behavior is normal.        Assessment & [name]:  1. 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 DVT Prophylaxis/Anticoagulation:  Follow-up Doppler 01/30/2017 shows propagation of left calf DVT into the popliteal and femoral veins on the left side. Also, new right tibioperoneal trunk and distal tibial DVT, started on Xarelto6/27/18             -check dopplers again this month---continue xarelto for now pending results 3. Right shoulder pain: likely right rotator cuff tendonitis  -exercises provided  -heat/ice 4. Seizure prophylaxis. Keppra               - off keppra  9. Diabetes mellitus. Per primary   Fifteen minutes of face to face patient care time were spent during this visit. All questions were encouraged and answered.  Follow up with me in 6 weeks.

## 2017-06-26 ENCOUNTER — Encounter: Payer: Self-pay | Admitting: Physical Medicine & Rehabilitation

## 2017-06-26 ENCOUNTER — Encounter: Payer: Self-pay | Attending: Physical Medicine & Rehabilitation | Admitting: Physical Medicine & Rehabilitation

## 2017-06-26 ENCOUNTER — Other Ambulatory Visit: Payer: Self-pay

## 2017-06-26 VITALS — BP 135/86 | HR 76

## 2017-06-26 DIAGNOSIS — G8250 Quadriplegia, unspecified: Secondary | ICD-10-CM | POA: Insufficient documentation

## 2017-06-26 DIAGNOSIS — M75101 Unspecified rotator cuff tear or rupture of right shoulder, not specified as traumatic: Secondary | ICD-10-CM | POA: Insufficient documentation

## 2017-06-26 DIAGNOSIS — I608 Other nontraumatic subarachnoid hemorrhage: Secondary | ICD-10-CM

## 2017-06-26 DIAGNOSIS — R569 Unspecified convulsions: Secondary | ICD-10-CM | POA: Insufficient documentation

## 2017-06-26 DIAGNOSIS — Z7901 Long term (current) use of anticoagulants: Secondary | ICD-10-CM | POA: Insufficient documentation

## 2017-06-26 DIAGNOSIS — E119 Type 2 diabetes mellitus without complications: Secondary | ICD-10-CM | POA: Insufficient documentation

## 2017-06-26 DIAGNOSIS — Z86718 Personal history of other venous thrombosis and embolism: Secondary | ICD-10-CM | POA: Insufficient documentation

## 2017-06-26 DIAGNOSIS — I82402 Acute embolism and thrombosis of unspecified deep veins of left lower extremity: Secondary | ICD-10-CM

## 2017-06-26 DIAGNOSIS — I671 Cerebral aneurysm, nonruptured: Secondary | ICD-10-CM | POA: Insufficient documentation

## 2017-06-26 NOTE — Progress Notes (Signed)
Subjective:    Patient ID: Aimee [name], female    DOB: May 16, 1976, 41 y.o.   MRN: [id number]  HPI   Patient is here in follow-up of her ruptured aneurysm.  She continues to work and is doing quite well.  She is back to her old job although she had to back off some of the hours initially because it was a bit much fOR her from a fatigue standpoint.  I ordered Dopplers which were done on October 10.  I had a call report which stated these were negative.  We stopped Xarelto the same day.  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 - No evidence of Baker&'s cyst on the right or left.  Seth Bake states that she does no longer have any calf or popliteal pain.  Her knee sometimes is tender after she has been up on it for long periods of time such as at work.  Her pain levels in general are minimal.  She still has some right shoulder pain which can bother her while she sleeps her when she is performing tasks overhead.  She uses tramadol rarely but likes to stay away from these.  She has used Tylenol on occasion which helps  Pain Inventory Average Pain 4 Pain Right Now 4 My pain is stabbing  In the last 24 hours, has pain interfered with the following? General activity 2 Relation with others 0 Enjoyment of life 0 What TIME of day is your pain at its worst? . Sleep (in general) Fair  Pain is worse with: inactivity Pain improves with: heat/ice and therapy/exercise Relief from Meds: 0  Mobility walk without assistance ability to climb steps?  yes do you drive?  yes  Function employed # of hrs/week 40  Neuro/Psych numbness tingling  Prior Studies Any changes since last visit?  no  Physicians involved in your care Any changes since last visit?  no   Family History  Problem  Relation Age of Onset  . Stroke Mother    Social History   Socioeconomic History  . Marital status: Single    Spouse name: Not on file  . Number of children: Not on file  . Years of education: Not on file  . Highest education level: Not on file  Social Needs  . Financial resource strain: Not on file  . Food insecurity - worry: Not on file  . Food insecurity - inability: Not on file  . Transportation needs - medical: Not on file  . Transportation needs - non-medical: Not on file  Occupational History  . Not on file  Tobacco Use  . Smoking status: Never Smoker  . Smokeless tobacco: Never Used  Substance and Sexual Activity  . Alcohol use: No  . Drug use: No  . Sexual activity: Not on file  Other Topics Concern  . Not on file  Social History Narrative  . Not on file   Past Surgical History:  Procedure Laterality Date  . IR ANGIO INTRA EXTRACRAN SEL INTERNAL CAROTID BILAT MOD SED  12/25/2016  . IR ANGIO VERTEBRAL SEL VERTEBRAL UNI L MOD SED  12/25/2016  . IR ANGIOGRAM FOLLOW UP STUDY  12/25/2016  . IR ANGIOGRAM FOLLOW UP STUDY  12/25/2016  . IR ANGIOGRAM FOLLOW UP STUDY  12/25/2016  . IR ANGIOGRAM FOLLOW UP STUDY  12/25/2016  .  IR TRANSCATH/EMBOLIZ  12/25/2016  . RADIOLOGY WITH ANESTHESIA N/A 12/25/2016   Procedure: RADIOLOGY WITH ANESTHESIA;  Surgeon: Consuella Lose, MD;  Location: [facility];  Service: Radiology;  Laterality: N/A;   Past Medical History:  Diagnosis Date  . Diabetes mellitus without complication ([place])   . Hypertension   . Stroke [facility])    There were no vitals taken for this visit.  Opioid Risk Score:   Fall Risk Score:  `1  Depression screen PHQ 2/9  Depression screen [facility] 2/9 04/23/2017 03/18/2017 02/26/2017 02/08/2017  Decreased Interest 0 0 0 0  Down, Depressed, Hopeless 0 0 0 0  PHQ - 2 Score 0 0 0 0  Altered sleeping 0 0 0 0  Tired, decreased energy 0 0 0 0  Change in appetite 0 0 0 0  Feeling bad or failure about yourself  0 0 0 0  Trouble  concentrating 0 0 0 0  Moving slowly or fidgety/restless 0 0 0 0  Suicidal thoughts 0 - - -  PHQ-9 Score 0 0 0 0     Review of Systems  Constitutional: Negative.   HENT: Negative.   Eyes: Negative.   Respiratory: Negative.   Cardiovascular: Negative.   Gastrointestinal: Negative.   Endocrine:       High sugar  Genitourinary: Negative.   Musculoskeletal: Negative.   Skin: Negative.   Allergic/Immunologic: Negative.   Neurological: Negative.   Hematological: Negative.   Psychiatric/Behavioral: Negative.   All other systems reviewed and are negative.      Objective:   Physical Exam  Constitutional: She appears well-developed. NAD. HENT: Normocephalic and atraumatic.  Eyes: EOMI. No discharge.  Cardiovascular:  Regular rate Respiratory:  Normal effort  GI: soft, BS+  Musculoskeletal: mild shoulder pain with impingement maneuvers. Can touch back of head and belt line.  Neurological: Alert and oriented cognitively she is inTACT Motor: RUE:5/5  LUE: 5/5  RLE: 5/5 right hip flexion, KE, 5/5 ADF/PF LLE: 5/5 proximal to 5/5 distal  Skin: Skin is warm and dry. Intact Psychiatric: She has a normal mood and affect. Her behavior is normal.      Assessment & [name]:  1. 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 2. DVT Prophylaxis/Anticoagulation:  Follow-up Doppler 01/30/2017 shows propagation of left calf DVT into the popliteal and femoral veins on the left side.   -05/15/17 doppler with conflicting reports, appears to have some chronic wall thickening in left popliteal.  I suspect there is just some chronic wall thickening in the vein and not a persistent clot.  Exam is  not suspicious for ongoing thrombus.  We will go ahead and stay off Xarelto.  -I asked her to call me if she had any pain swelling or warmth in the popliteal crease or calf regions. 3. Right shoulder pain: likely right rotator cuff tendonitis             -mild  -OTC ibuprofen or Tylenol  -continue exercises as we have discussed 4. Diabetes mellitus. Per primary   Fifteen minutes of face to face patient care time were spent during this visit. All questions were encouraged and answered. Follow up with me in 3 months.

## 2017-06-26 NOTE — Patient Instructions (Signed)
IBUPROFEN 400MG  EVERY 8 HOURS AS NEEDED TYLENOL 500MG  EVERY 6 HORS AS NEEDED  CONTINUE WITH YOUR STRETCHES  PLEASE FEEL FREE TO CALL OUR OFFICE WITH ANY PROBLEMS OR QUESTIONS ([phone])

## 2017-07-04 ENCOUNTER — Encounter: Payer: Self-pay | Admitting: Internal Medicine

## 2017-07-04 ENCOUNTER — Ambulatory Visit: Payer: Self-pay | Attending: Internal Medicine | Admitting: Internal Medicine

## 2017-07-04 VITALS — BP 129/90 | HR 72 | Temp 98.6°F | Resp 18 | Ht 64.0 in | Wt 190.0 lb

## 2017-07-04 DIAGNOSIS — I10 Essential (primary) hypertension: Secondary | ICD-10-CM

## 2017-07-04 DIAGNOSIS — Z7984 Long term (current) use of oral hypoglycemic drugs: Secondary | ICD-10-CM | POA: Insufficient documentation

## 2017-07-04 DIAGNOSIS — I609 Nontraumatic subarachnoid hemorrhage, unspecified: Secondary | ICD-10-CM | POA: Insufficient documentation

## 2017-07-04 DIAGNOSIS — Z86718 Personal history of other venous thrombosis and embolism: Secondary | ICD-10-CM | POA: Insufficient documentation

## 2017-07-04 DIAGNOSIS — Z7901 Long term (current) use of anticoagulants: Secondary | ICD-10-CM | POA: Insufficient documentation

## 2017-07-04 DIAGNOSIS — Z1231 Encounter for screening mammogram for malignant neoplasm of breast: Secondary | ICD-10-CM

## 2017-07-04 DIAGNOSIS — E669 Obesity, unspecified: Secondary | ICD-10-CM | POA: Insufficient documentation

## 2017-07-04 DIAGNOSIS — G8250 Quadriplegia, unspecified: Secondary | ICD-10-CM | POA: Insufficient documentation

## 2017-07-04 DIAGNOSIS — Z6832 Body mass index (BMI) 32.0-32.9, adult: Secondary | ICD-10-CM | POA: Insufficient documentation

## 2017-07-04 DIAGNOSIS — Z1239 Encounter for other screening for malignant neoplasm of breast: Secondary | ICD-10-CM

## 2017-07-04 DIAGNOSIS — E119 Type 2 diabetes mellitus without complications: Secondary | ICD-10-CM

## 2017-07-04 MED ORDER — LISINOPRIL 5 MG PO TABS: 5.0000 mg | ORAL_TABLET | Freq: Every day | ORAL | 3 refills | Status: DC

## 2017-07-04 NOTE — Progress Notes (Signed)
Patient ID: Aimee [name], female    DOB: Mar 06, 1976  MRN: [id number]  CC: Follow-up   Subjective: Aimee [name] is a 41 y.o. female who presents for chronic disease management. Her concerns today include:  41 year old FM with history of diabetes type 2, HTN, recent SAH (12/25/2016 with PCA aneurysm status post coiling), resolving quadriparesis, DVT LLE and RLE, completed course of Xarelto.   1. Hx SAH:  Strength almost back to baseline -she has seen Dr. Tessa Lerner in f/u.  Keppra was discontinued. Repeat Doppler ultrasound revealed that the clots have resolved.  Xarelto was discontinued. -pain in RT shoulder. Shown stretching exercises to do and told to use ibuprofen PRN. Helping so far -DBP noted to be running high.  2. DM: doing well. Checks BS Q3 days with range 80-130 -Eating habits: good except bread is her weakness Exercise: walking 30 mins daily. Loss 7 lbs since last visit Med: compliant with Metformin Had eye exam with Dr. Leighton Ruff on 04/25/2017. No retinopathy.  She has a copy of the report for our records  3. MMG: has problems getting one due to no insurnace Needs letter for dentist for cleaning.  Patient Active Problem List   Diagnosis Date Noted  . Rotator cuff syndrome of right shoulder 06/26/2017  . Type 2 diabetes mellitus without complication, without long-term current use of insulin ([place]) 04/11/2017  . Acute deep vein thrombosis (DVT) of both lower extremities ([place]) 04/11/2017  . Acute blood loss anemia   . Deep vein thrombosis (DVT) ([place])   . Gait disturbance, post-stroke 01/24/2017  . Reactive hypertension   . Quadriparesis ([place])   . Class 1 obesity due to excess calories with serious comorbidity and body mass index (BMI) of 30.0 to 30.9 in adult   . Essential hypertension   . Diabetes mellitus type 2 in obese ([place])   . Seizure ([place])   . Subarachnoid hemorrhage due to ruptured aneurysm ([place]) 12/25/2016     Current Outpatient  Medications on File Prior to Visit  Medication Sig Dispense Refill  . Blood Glucose Monitoring Suppl (TRUE METRIX METER) w/Device KIT Use as directed 1 kit 0  . docusate sodium (COLACE) 100 MG capsule Take 1 capsule (100 mg total) by mouth daily. 10 capsule 0  . glucose blood (TRUE METRIX BLOOD GLUCOSE TEST) test strip Use as instructed 100 each 12  . metFORMIN (GLUCOPHAGE) 500 MG tablet Take 1 tablet (500 mg total) by mouth daily with breakfast. 30 tablet 3  . pantoprazole (PROTONIX) 40 MG tablet Take 1 tablet (40 mg total) by mouth daily before breakfast. 30 tablet 3  . rivaroxaban (XARELTO) 20 MG TABS tablet 20 mg daily beginning 02/20/2017 30 tablet 1  . traMADol (ULTRAM) 50 MG tablet Take by mouth every 6 (six) hours as needed.     No current facility-administered medications on file prior to visit.     No Known Allergies  Social History   Socioeconomic History  . Marital status: Single    Spouse name: Not on file  . Number of children: Not on file  . Years of education: Not on file  . Highest education level: Not on file  Social Needs  . Financial resource strain: Not on file  . Food insecurity - worry: Not on file  . Food insecurity - inability: Not on file  . Transportation needs - medical: Not on file  . Transportation needs - non-medical: Not on file  Occupational History  . Not on file  Tobacco  Use  . Smoking status: Never Smoker  . Smokeless tobacco: Never Used  Substance and Sexual Activity  . Alcohol use: No  . Drug use: No  . Sexual activity: Not on file  Other Topics Concern  . Not on file  Social History Narrative  . Not on file    Family History  Problem Relation Age of Onset  . Stroke Mother     Past Surgical History:  Procedure Laterality Date  . IR ANGIO INTRA EXTRACRAN SEL INTERNAL CAROTID BILAT MOD SED  12/25/2016  . IR ANGIO VERTEBRAL SEL VERTEBRAL UNI L MOD SED  12/25/2016  . IR ANGIOGRAM FOLLOW UP STUDY  12/25/2016  . IR ANGIOGRAM FOLLOW UP  STUDY  12/25/2016  . IR ANGIOGRAM FOLLOW UP STUDY  12/25/2016  . IR ANGIOGRAM FOLLOW UP STUDY  12/25/2016  . IR TRANSCATH/EMBOLIZ  12/25/2016  . RADIOLOGY WITH ANESTHESIA N/A 12/25/2016   Procedure: RADIOLOGY WITH ANESTHESIA;  Surgeon: Consuella Lose, MD;  Location: [facility];  Service: Radiology;  Laterality: N/A;    ROS: Review of Systems Negative except as stated above PHYSICAL EXAM: BP 129/90 (BP Location: Left Arm, Patient Position: Sitting, Cuff Size: Large)   Pulse 72   Temp 98.6 F (37 C) (Oral)   Resp 18   Ht '5\' 4"'  (1.626 m)   Wt 190 lb (86.2 kg)   SpO2 99%   BMI 32.61 kg/m   Wt Readings from Last 3 Encounters:  07/04/17 190 lb (86.2 kg)  04/23/17 196 lb 3.2 oz (89 kg)  04/10/17 197 lb 9.6 oz (89.6 kg)  Repeat BP 146/94  Physical Exam  General appearance - alert, well appearing, and in no distress Mental status - alert, oriented to person, place, and time Mouth - mucous membranes moist, pharynx normal without lesions Neck - supple, no significant adenopathy Chest - clear to auscultation, no wheezes, rales or rhonchi, symmetric air entry Heart - normal rate, regular rhythm, normal S1, S2, no murmurs, rubs, clicks or gallops Extremities - peripheral pulses normal, no pedal edema, no clubbing or cyanosis  BS 93  ASSESSMENT AND [name]: 1. Type 2 diabetes mellitus without complication, without long-term current use of insulin (HCC) At goal.  Continue metformin, healthy eating habits and regular exercise  2. Essential hypertension Patient with persistent elevation with diastolic blood pressure.  Given her history of SAH, we will start her on low-dose of lisinopril. -Check blood pressure once a week and bring in readings for nurse only blood pressure check in about a month. DASH diet discussed and encouraged. - lisinopril (PRINIVIL,ZESTRIL) 5 MG tablet; Take 1 tablet (5 mg total) by mouth daily.  Dispense: 90 tablet; Refill: 3  3. Breast cancer screening CMA to give  scholarship for her to get mammogram  Patient was given the opportunity to ask questions.  Patient verbalized understanding of the [name] and was able to repeat key elements of the [name].   No orders of the defined types were placed in this encounter.    Requested Prescriptions    No prescriptions requested or ordered in this encounter    Follow-up in 3 months. Karle Plumber, MD, FACP

## 2017-07-04 NOTE — Patient Instructions (Signed)
Give appointment with RN for BP check in 3 weeks.  Start Lisinopril 5 mg daily for blood pressure Limit the salt in your foods. Check your blood pressure once a week. Goal is to be less than 140/90.

## 2017-07-25 ENCOUNTER — Ambulatory Visit: Payer: Self-pay | Attending: Internal Medicine | Admitting: *Deleted

## 2017-07-25 ENCOUNTER — Other Ambulatory Visit: Payer: Self-pay

## 2017-07-25 ENCOUNTER — Other Ambulatory Visit: Payer: Self-pay | Admitting: *Deleted

## 2017-07-25 VITALS — BP 124/70 | HR 78

## 2017-07-25 DIAGNOSIS — I10 Essential (primary) hypertension: Secondary | ICD-10-CM | POA: Insufficient documentation

## 2017-07-25 MED ORDER — METFORMIN HCL 500 MG PO TABS: 500.0000 mg | ORAL_TABLET | Freq: Every day | ORAL | 0 refills | Status: DC

## 2017-08-09 ENCOUNTER — Ambulatory Visit
Admission: RE | Admit: 2017-08-09 | Discharge: 2017-08-09 | Disposition: A | Discharge status: Home / Self Care | Payer: No Typology Code available for payment source | Source: Ambulatory Visit | Attending: Internal Medicine | Admitting: Internal Medicine

## 2017-08-09 DIAGNOSIS — Z1239 Encounter for other screening for malignant neoplasm of breast: Secondary | ICD-10-CM

## 2017-08-26 ENCOUNTER — Other Ambulatory Visit (HOSPITAL_COMMUNITY): Payer: Self-pay | Admitting: *Deleted

## 2017-08-26 DIAGNOSIS — R928 Other abnormal and inconclusive findings on diagnostic imaging of breast: Secondary | ICD-10-CM

## 2017-08-27 ENCOUNTER — Ambulatory Visit: Payer: No Typology Code available for payment source | Attending: Internal Medicine

## 2017-09-05 ENCOUNTER — Ambulatory Visit
Admission: RE | Admit: 2017-09-05 | Discharge: 2017-09-05 | Disposition: A | Discharge status: Home / Self Care | Payer: No Typology Code available for payment source | Source: Ambulatory Visit | Attending: Obstetrics and Gynecology | Admitting: Obstetrics and Gynecology

## 2017-09-05 ENCOUNTER — Other Ambulatory Visit (HOSPITAL_COMMUNITY): Payer: Self-pay | Admitting: Obstetrics and Gynecology

## 2017-09-05 ENCOUNTER — Encounter (HOSPITAL_COMMUNITY): Payer: Self-pay

## 2017-09-05 ENCOUNTER — Ambulatory Visit (HOSPITAL_COMMUNITY)
Admission: RE | Admit: 2017-09-05 | Discharge: 2017-09-05 | Disposition: A | Discharge status: Home / Self Care | Payer: Self-pay | Source: Ambulatory Visit | Attending: Obstetrics and Gynecology | Admitting: Obstetrics and Gynecology

## 2017-09-05 VITALS — BP 124/84 | Ht 64.0 in | Wt 194.0 lb

## 2017-09-05 DIAGNOSIS — R928 Other abnormal and inconclusive findings on diagnostic imaging of breast: Secondary | ICD-10-CM

## 2017-09-05 DIAGNOSIS — Z1239 Encounter for other screening for malignant neoplasm of breast: Secondary | ICD-10-CM

## 2017-09-05 DIAGNOSIS — N6310 Unspecified lump in the right breast, unspecified quadrant: Secondary | ICD-10-CM

## 2017-09-05 NOTE — Patient Instructions (Signed)
Explained breast self awareness with [name] Plan. Patient did not need a Pap smear today due to last Pap smear and HPV typing was 04/23/2017. Let her know BCCCP will cover Pap smears and HPV typing every 5 years unless has a history of abnormal Pap smears. Referred patient to the [facility] for a right breast diagnostic mammogram and breast ultrasound per recommendation. Appointment scheduled for Thursday, September 05, 2017 at 1250. [name] Plan verbalized understanding.  Redford Behrle, Arvil Chaco, RN 2:55 PM

## 2017-09-05 NOTE — Progress Notes (Signed)
Patient referred to [facility] by the [facility] due to recommending additional imaging of the right breast. Screening mammogram completed 08/09/2017.  Pap Smear: Pap smear not completed today. Last Pap smear was 04/23/2017 at [facility] and normal with negative HPV. Per patient has no history of an abnormal Pap smear. Last Pap smear result is in Epic.  Physical exam: Breasts Breasts symmetrical. No skin abnormalities bilateral breasts. No nipple retraction bilateral breasts. No nipple discharge bilateral breasts. No lymphadenopathy. No lumps palpated bilateral breasts. No complaints of pain or tenderness on exam. Referred patient to the [facility] for a right breast diagnostic mammogram and breast ultrasound per recommendation. Appointment scheduled for Thursday, September 05, 2017 at 1250.        Pelvic/Bimanual No Pap smear completed today since last Pap smear and HPV typing was 04/23/2017. Pap smear not indicated per BCCCP guidelines.   Smoking History: Patient has never smoked.  Patient Navigation: Patient education provided. Access to services provided for patient through [facility] program.

## 2017-09-25 ENCOUNTER — Encounter: Payer: Self-pay | Attending: Physical Medicine & Rehabilitation | Admitting: Physical Medicine & Rehabilitation

## 2017-10-14 ENCOUNTER — Encounter: Payer: Self-pay | Admitting: Physical Medicine & Rehabilitation

## 2017-10-14 ENCOUNTER — Encounter: Payer: Self-pay | Attending: Physical Medicine & Rehabilitation | Admitting: Physical Medicine & Rehabilitation

## 2017-10-14 VITALS — BP 132/90 | HR 79 | Resp 14

## 2017-10-14 DIAGNOSIS — M25511 Pain in right shoulder: Secondary | ICD-10-CM | POA: Insufficient documentation

## 2017-10-14 DIAGNOSIS — R269 Unspecified abnormalities of gait and mobility: Secondary | ICD-10-CM

## 2017-10-14 DIAGNOSIS — I69398 Other sequelae of cerebral infarction: Secondary | ICD-10-CM

## 2017-10-14 DIAGNOSIS — I671 Cerebral aneurysm, nonruptured: Secondary | ICD-10-CM | POA: Insufficient documentation

## 2017-10-14 DIAGNOSIS — I69365 Other paralytic syndrome following cerebral infarction, bilateral: Secondary | ICD-10-CM | POA: Insufficient documentation

## 2017-10-14 DIAGNOSIS — M75101 Unspecified rotator cuff tear or rupture of right shoulder, not specified as traumatic: Secondary | ICD-10-CM

## 2017-10-14 DIAGNOSIS — Z86718 Personal history of other venous thrombosis and embolism: Secondary | ICD-10-CM | POA: Insufficient documentation

## 2017-10-14 DIAGNOSIS — G8250 Quadriplegia, unspecified: Secondary | ICD-10-CM | POA: Insufficient documentation

## 2017-10-14 DIAGNOSIS — I608 Other nontraumatic subarachnoid hemorrhage: Secondary | ICD-10-CM

## 2017-10-14 DIAGNOSIS — E119 Type 2 diabetes mellitus without complications: Secondary | ICD-10-CM | POA: Insufficient documentation

## 2017-10-14 NOTE — Progress Notes (Signed)
Subjective:    Patient ID: Aimee [name], female    DOB: 1975/09/07, 42 y.o.   MRN: [id number]  HPI   Mrs. Patsey Berthold is back in follow-up of her subarachnoid hemorrhage.  She has continued to work full-time.  However, she has started breaking up per day into 2 parts.  In the middle of the day she goes home and takes a nap and rest for an hour to 2 hours and then resumes work at her next location.  This has worked better for her.  She denies any pain problems with her left calf.  Right shoulder range of motion is improved back to baseline essentially.  Her sugars have been under better control and her primary is helping her with her blood pressure.   Pain Inventory Average Pain 3 Pain Right Now 3 My pain is tingling  In the last 24 hours, has pain interfered with the following? General activity 0 Relation with others 0 Enjoyment of life 0 What TIME of day is your pain at its worst? evening Sleep (in general) Good  Pain is worse with: sitting and inactivity Pain improves with: rest and medication Relief from Meds: 8  Mobility walk without assistance how many minutes can you walk? 60 ability to climb steps?  yes do you drive?  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  no  Physicians involved in your care Any changes since last visit?  no   Family History  Problem Relation Age of Onset  . Stroke Mother   . Hypertension Mother    Social History   Socioeconomic History  . Marital status: Single    Spouse name: None  . Number of children: None  . Years of education: None  . Highest education level: None  Social Needs  . Financial resource strain: None  . Food insecurity - worry: None  . Food insecurity - inability: None  . Transportation needs - medical: None  . Transportation needs - non-medical: None  Occupational History  . None  Tobacco Use    . Smoking status: Never Smoker  . Smokeless tobacco: Never Used  Substance and Sexual Activity  . Alcohol use: No  . Drug use: No  . Sexual activity: None  Other Topics Concern  . None  Social History Narrative  . None   Past Surgical History:  Procedure Laterality Date  . IR ANGIO INTRA EXTRACRAN SEL INTERNAL CAROTID BILAT MOD SED  12/25/2016  . IR ANGIO VERTEBRAL SEL VERTEBRAL UNI L MOD SED  12/25/2016  . IR ANGIOGRAM FOLLOW UP STUDY  12/25/2016  . IR ANGIOGRAM FOLLOW UP STUDY  12/25/2016  . IR ANGIOGRAM FOLLOW UP STUDY  12/25/2016  . IR ANGIOGRAM FOLLOW UP STUDY  12/25/2016  . IR TRANSCATH/EMBOLIZ  12/25/2016  . RADIOLOGY WITH ANESTHESIA N/A 12/25/2016   Procedure: RADIOLOGY WITH ANESTHESIA;  Surgeon: Consuella Lose, MD;  Location: [facility];  Service: Radiology;  Laterality: N/A;   Past Medical History:  Diagnosis Date  . Diabetes mellitus without complication ([place])   . Hypertension   . Stroke ([place])    BP 132/90 (BP Location: Right Arm, Patient Position: Sitting, Cuff Size: Normal)   Pulse 79   Resp 14   SpO2 99%   Opioid Risk Score:   Fall Risk Score:  `1  Depression screen PHQ 2/9  Depression screen [facility] 2/9 04/23/2017 03/18/2017 02/26/2017 02/08/2017  Decreased  Interest 0 0 0 0  Down, Depressed, Hopeless 0 0 0 0  PHQ - 2 Score 0 0 0 0  Altered sleeping 0 0 0 0  Tired, decreased energy 0 0 0 0  Change in appetite 0 0 0 0  Feeling bad or failure about yourself  0 0 0 0  Trouble concentrating 0 0 0 0  Moving slowly or fidgety/restless 0 0 0 0  Suicidal thoughts 0 - - -  PHQ-9 Score 0 0 0 0    Review of Systems  Constitutional: Negative.   HENT: Negative.   Eyes: Negative.   Respiratory: Positive for shortness of breath.   Cardiovascular: Negative.   Gastrointestinal: Negative.   Endocrine: Negative.   Genitourinary: Negative.   Musculoskeletal: Positive for arthralgias.  Allergic/Immunologic: Negative.   Neurological: Positive for numbness.       Tingling    Psychiatric/Behavioral: Negative.   All other systems reviewed and are negative.      Objective:   Physical Exam  General: No acute distress HEENT: EOMI, oral membranes moist Cards: reg rate  Chest: normal effort Abdomen: Soft, NT, ND Skin: dry, intact Extremities: no edema   Musculoskeletal: minimal  shoulder pain with impingement maneuvers. Can touch back of head and belt line without discomfort.  Neurological: Alert and oriented cognitively she is inTACT Motor: RUE:5/5  LUE: 5/5  RLE: 5/5 right hip flexion, KE, 5/5 ADF/PF LLE: 5/5 proximal to 5/5 distal  Skin: Skin is warm and dry. Intact Psychiatric:  pleasant      Assessment & [name]:  1. 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 Knows her limits             2.  Left popliteal dvt---mild chronic phlebitis: essentially resolved            -no new issues with pain or swelling 3.Right shoulder pain: likely right rotator cuff tendonitis -essentially resolved   -continue HEP and appropriate exercises 4. Diabetes mellitus. Per primary 5. HTN: discussed importance of control.    10 minutes of face to face patient care time were spent during this visit. All questions were encouraged and answered. Follow up with me PRN.

## 2017-10-14 NOTE — Patient Instructions (Signed)
PLEASE FEEL FREE TO CALL OUR OFFICE WITH ANY PROBLEMS OR QUESTIONS ([phone])      

## 2017-10-31 ENCOUNTER — Ambulatory Visit: Payer: Self-pay | Admitting: Internal Medicine

## 2017-11-05 ENCOUNTER — Ambulatory Visit: Payer: Self-pay | Attending: Internal Medicine | Admitting: Internal Medicine

## 2017-11-05 ENCOUNTER — Encounter: Payer: Self-pay | Admitting: Internal Medicine

## 2017-11-05 VITALS — BP 123/84 | HR 70 | Temp 98.6°F | Resp 16 | Wt 196.6 lb

## 2017-11-05 DIAGNOSIS — Z86718 Personal history of other venous thrombosis and embolism: Secondary | ICD-10-CM | POA: Insufficient documentation

## 2017-11-05 DIAGNOSIS — G8250 Quadriplegia, unspecified: Secondary | ICD-10-CM | POA: Insufficient documentation

## 2017-11-05 DIAGNOSIS — Z8249 Family history of ischemic heart disease and other diseases of the circulatory system: Secondary | ICD-10-CM | POA: Insufficient documentation

## 2017-11-05 DIAGNOSIS — I10 Essential (primary) hypertension: Secondary | ICD-10-CM | POA: Insufficient documentation

## 2017-11-05 DIAGNOSIS — Z86711 Personal history of pulmonary embolism: Secondary | ICD-10-CM | POA: Insufficient documentation

## 2017-11-05 DIAGNOSIS — Z7984 Long term (current) use of oral hypoglycemic drugs: Secondary | ICD-10-CM | POA: Insufficient documentation

## 2017-11-05 DIAGNOSIS — Z823 Family history of stroke: Secondary | ICD-10-CM | POA: Insufficient documentation

## 2017-11-05 DIAGNOSIS — E119 Type 2 diabetes mellitus without complications: Secondary | ICD-10-CM | POA: Insufficient documentation

## 2017-11-05 DIAGNOSIS — J301 Allergic rhinitis due to pollen: Secondary | ICD-10-CM | POA: Insufficient documentation

## 2017-11-05 DIAGNOSIS — Z79899 Other long term (current) drug therapy: Secondary | ICD-10-CM | POA: Insufficient documentation

## 2017-11-05 DIAGNOSIS — I69065 Other paralytic syndrome following nontraumatic subarachnoid hemorrhage, bilateral: Secondary | ICD-10-CM | POA: Insufficient documentation

## 2017-11-05 LAB — GLUCOSE, POCT (MANUAL RESULT ENTRY): POC Glucose: 100 mg/dL — AB (ref 70–99)

## 2017-11-05 LAB — POCT GLYCOSYLATED HEMOGLOBIN (HGB A1C): Hemoglobin A1C: 5.6

## 2017-11-05 MED ORDER — LISINOPRIL 5 MG PO TABS: 5.0000 mg | ORAL_TABLET | Freq: Every day | ORAL | 3 refills | Status: DC

## 2017-11-05 MED ORDER — METFORMIN HCL 500 MG PO TABS: 500.0000 mg | ORAL_TABLET | Freq: Every day | ORAL | 3 refills | Status: DC

## 2017-11-05 NOTE — Patient Instructions (Signed)
Purchase and use Claritin as needed for allergy symptoms.

## 2017-11-05 NOTE — Progress Notes (Signed)
Patient ID: Aimee [name], female    DOB: May 17, 1976  MRN: [id number]  CC: Diabetes and Hypertension   Subjective: Aimee [name] is a 42 y.o. female who presents for chronic ds management Her concerns today include:  42 year old FM with history of diabetes type 2, HTN, recent SAH (12/25/2016 with PCA aneurysm status post coiling), resolving quadriparesis, DVT LLE and RLE, completed course of Xarelto.  1.  Hx of SAH:  Saw the neurosurgeon, Dr. Kathyrn Sheriff, 08/2017.  Told she will be scheduled for f/u imaging in 01/2018 of this yr. otherwise she is doing well.  She denies any chronic headaches or dizziness.  2.  C/o nasal congestion and pressure over ethmoid sinuses in mornings x 1 mth. No itchy eyes, throat.  Some itching of ears.  Hx of rhinitus in spring.  3.  HTN: compliant with Lisinopril and salt restriction.  4.  DM: Compliant with metformin. Walking 30 mins a day Does well with eating habits Patient Active Problem List   Diagnosis Date Noted  . Rotator cuff syndrome of right shoulder 06/26/2017  . Type 2 diabetes mellitus without complication, without long-term current use of insulin ([place]) 04/11/2017  . Acute deep vein thrombosis (DVT) of both lower extremities ([place]) 04/11/2017  . Acute blood loss anemia   . Deep vein thrombosis (DVT) ([place])   . Gait disturbance, post-stroke 01/24/2017  . Reactive hypertension   . Quadriparesis ([place])   . Class 1 obesity due to excess calories with serious comorbidity and body mass index (BMI) of 30.0 to 30.9 in adult   . Essential hypertension   . Diabetes mellitus type 2 in obese ([place])   . Seizure ([place])   . Subarachnoid hemorrhage due to ruptured aneurysm ([place]) 12/25/2016     Current Outpatient Medications on File Prior to Visit  Medication Sig Dispense Refill  . Blood Glucose Monitoring Suppl (TRUE METRIX METER) w/Device KIT Use as directed 1 kit 0  . glucose blood (TRUE METRIX BLOOD GLUCOSE TEST) test strip Use as  instructed 100 each 12   No current facility-administered medications on file prior to visit.     No Known Allergies  Social History   Socioeconomic History  . Marital status: Single    Spouse name: Not on file  . Number of children: Not on file  . Years of education: Not on file  . Highest education level: Not on file  Occupational History  . Not on file  Social Needs  . Financial resource strain: Not on file  . Food insecurity:    Worry: Not on file    Inability: Not on file  . Transportation needs:    Medical: Not on file    Non-medical: Not on file  Tobacco Use  . Smoking status: Never Smoker  . Smokeless tobacco: Never Used  Substance and Sexual Activity  . Alcohol use: No  . Drug use: No  . Sexual activity: Not on file  Lifestyle  . Physical activity:    Days per week: 0 days    Minutes per session: 0 min  . Stress: Not at all  Relationships  . Social connections:    Talks on phone: More than three times a week    Gets together: Twice a week    Attends religious service: More than 4 times per year    Active member of club or organization: No    Attends meetings of clubs or organizations: Never    Relationship status: Never married  .  Intimate partner violence:    Fear of current or ex partner: Patient refused    Emotionally abused: Patient refused    Physically abused: Patient refused    Forced sexual activity: Patient refused  Other Topics Concern  . Not on file  Social History Narrative  . Not on file    Family History  Problem Relation Age of Onset  . Stroke Mother   . Hypertension Mother     Past Surgical History:  Procedure Laterality Date  . IR ANGIO INTRA EXTRACRAN SEL INTERNAL CAROTID BILAT MOD SED  12/25/2016  . IR ANGIO VERTEBRAL SEL VERTEBRAL UNI L MOD SED  12/25/2016  . IR ANGIOGRAM FOLLOW UP STUDY  12/25/2016  . IR ANGIOGRAM FOLLOW UP STUDY  12/25/2016  . IR ANGIOGRAM FOLLOW UP STUDY  12/25/2016  . IR ANGIOGRAM FOLLOW UP STUDY   12/25/2016  . IR TRANSCATH/EMBOLIZ  12/25/2016  . RADIOLOGY WITH ANESTHESIA N/A 12/25/2016   Procedure: RADIOLOGY WITH ANESTHESIA;  Surgeon: Consuella Lose, MD;  Location: [facility];  Service: Radiology;  Laterality: N/A;    ROS: Review of Systems Negative except as stated above. PHYSICAL EXAM: BP 123/84   Pulse 70   Temp 98.6 F (37 C) (Oral)   Resp 16   Wt 196 lb 9.6 oz (89.2 kg)   SpO2 100%   BMI 33.75 kg/m   Physical Exam  General appearance - alert, well appearing, and in no distress Mental status - alert, oriented to person, place, and time, normal mood, behavior, speech, dress, motor activity, and thought processes Nose - normal and patent, no erythema, discharge or polyps Mouth - mucous membranes moist, pharynx normal without lesions Neck - supple, no significant adenopathy Chest - clear to auscultation, no wheezes, rales or rhonchi, symmetric air entry Heart - normal rate, regular rhythm, normal S1, S2, no murmurs, rubs, clicks or gallops Extremities - peripheral pulses normal, no pedal edema, no clubbing or cyanosis   BS 100/A1C 5.6   Chemistry      Component Value Date/Time   NA 139 02/05/2017 0443   K 3.8 02/05/2017 0443   CL 108 02/05/2017 0443   CO2 24 02/05/2017 0443   BUN 10 02/05/2017 0443   CREATININE 0.45 02/05/2017 0443      Component Value Date/Time   CALCIUM 8.8 (L) 02/05/2017 0443   ALKPHOS 57 01/08/2017 0546   AST 22 01/08/2017 0546   ALT 36 01/08/2017 0546   BILITOT 1.4 (H) 01/08/2017 0546      ASSESSMENT AND [name]: 1. Essential hypertension At goal - lisinopril (PRINIVIL,ZESTRIL) 5 MG tablet; Take 1 tablet (5 mg total) by mouth daily.  Dispense: 90 tablet; Refill: 3  2. Type 2 diabetes mellitus without complication, without long-term current use of insulin (HCC) At goal Continue healthy eating habits and regular exercise - POCT glucose (manual entry) - POCT glycosylated hemoglobin (Hb A1C) - metFORMIN (GLUCOPHAGE) 500 MG tablet; Take  1 tablet (500 mg total) by mouth daily with breakfast.  Dispense: 90 tablet; Refill: 3  3. Seasonal allergic rhinitis due to pollen Recommend over-the-counter Claritin as needed  Patient was given the opportunity to ask questions.  Patient verbalized understanding of the [name] and was able to repeat key elements of the [name].   Orders Placed This Encounter  Procedures  . POCT glucose (manual entry)  . POCT glycosylated hemoglobin (Hb A1C)     Requested Prescriptions   Signed Prescriptions Disp Refills  . metFORMIN (GLUCOPHAGE) 500 MG tablet 90 tablet 3  Sig: Take 1 tablet (500 mg total) by mouth daily with breakfast.  . lisinopril (PRINIVIL,ZESTRIL) 5 MG tablet 90 tablet 3    Sig: Take 1 tablet (5 mg total) by mouth daily.    Return in about 4 months (around 03/07/2018).  Karle Plumber, MD, FACP

## 2018-01-28 ENCOUNTER — Other Ambulatory Visit: Payer: Self-pay | Admitting: Neurosurgery

## 2018-01-28 DIAGNOSIS — I6031 Nontraumatic subarachnoid hemorrhage from right posterior communicating artery: Secondary | ICD-10-CM

## 2018-02-07 ENCOUNTER — Inpatient Hospital Stay (HOSPITAL_COMMUNITY): Admission: RE | Admit: 2018-02-07 | Payer: No Typology Code available for payment source | Source: Ambulatory Visit

## 2018-03-05 ENCOUNTER — Other Ambulatory Visit: Payer: No Typology Code available for payment source

## 2018-03-06 ENCOUNTER — Ambulatory Visit
Admission: RE | Admit: 2018-03-06 | Discharge: 2018-03-06 | Disposition: A | Discharge status: Home / Self Care | Payer: No Typology Code available for payment source | Source: Ambulatory Visit | Attending: Obstetrics and Gynecology | Admitting: Obstetrics and Gynecology

## 2018-03-06 DIAGNOSIS — N6310 Unspecified lump in the right breast, unspecified quadrant: Secondary | ICD-10-CM

## 2018-03-07 ENCOUNTER — Ambulatory Visit: Payer: No Typology Code available for payment source | Admitting: Internal Medicine

## 2018-05-08 ENCOUNTER — Ambulatory Visit: Payer: Self-pay | Attending: Internal Medicine | Admitting: Internal Medicine

## 2018-05-08 ENCOUNTER — Encounter: Payer: Self-pay | Admitting: Internal Medicine

## 2018-05-08 VITALS — BP 125/85 | HR 85 | Temp 99.1°F | Resp 16 | Ht 64.0 in | Wt 203.6 lb

## 2018-05-08 DIAGNOSIS — Z7984 Long term (current) use of oral hypoglycemic drugs: Secondary | ICD-10-CM | POA: Insufficient documentation

## 2018-05-08 DIAGNOSIS — G8250 Quadriplegia, unspecified: Secondary | ICD-10-CM | POA: Insufficient documentation

## 2018-05-08 DIAGNOSIS — I10 Essential (primary) hypertension: Secondary | ICD-10-CM | POA: Insufficient documentation

## 2018-05-08 DIAGNOSIS — Z86718 Personal history of other venous thrombosis and embolism: Secondary | ICD-10-CM | POA: Insufficient documentation

## 2018-05-08 DIAGNOSIS — E119 Type 2 diabetes mellitus without complications: Secondary | ICD-10-CM | POA: Insufficient documentation

## 2018-05-08 DIAGNOSIS — Z8249 Family history of ischemic heart disease and other diseases of the circulatory system: Secondary | ICD-10-CM | POA: Insufficient documentation

## 2018-05-08 DIAGNOSIS — Z8679 Personal history of other diseases of the circulatory system: Secondary | ICD-10-CM

## 2018-05-08 DIAGNOSIS — Z79899 Other long term (current) drug therapy: Secondary | ICD-10-CM | POA: Insufficient documentation

## 2018-05-08 DIAGNOSIS — R569 Unspecified convulsions: Secondary | ICD-10-CM | POA: Insufficient documentation

## 2018-05-08 DIAGNOSIS — Z6830 Body mass index (BMI) 30.0-30.9, adult: Secondary | ICD-10-CM | POA: Insufficient documentation

## 2018-05-08 DIAGNOSIS — E669 Obesity, unspecified: Secondary | ICD-10-CM | POA: Insufficient documentation

## 2018-05-08 DIAGNOSIS — Z23 Encounter for immunization: Secondary | ICD-10-CM | POA: Insufficient documentation

## 2018-05-08 DIAGNOSIS — E1169 Type 2 diabetes mellitus with other specified complication: Secondary | ICD-10-CM

## 2018-05-08 LAB — GLUCOSE, POCT (MANUAL RESULT ENTRY): POC Glucose: 102 mg/dL — AB (ref 70–99)

## 2018-05-08 MED ORDER — GLUCOSE BLOOD VI STRP: ORAL_STRIP | 12 refills | Status: DC

## 2018-05-08 MED ORDER — TRUEPLUS LANCETS 28G MISC: 4 refills | Status: AC

## 2018-05-08 NOTE — Patient Instructions (Signed)
Please give appointment with our clinical pharmacist, Lurena Joiner, in 1 week for repeat blood pressure check.

## 2018-05-08 NOTE — Progress Notes (Signed)
Patient ID: Aimee [name], female    DOB: 08-25-1975  MRN: [id number]  CC: Follow-up   Subjective: Aimee [name] is a 42 y.o. 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  DM: Overall she is doing well.  She checks blood sugars about 2-3 times a week.  She has never gotten a reading above 1 30-1 40.  Reports that she is doing well with her eating habits.  No numbness or tingling in the extremities.  Reports blurred vision at times.  Due for eye exam but cannot afford. -walks daily for 30 mins.  Disappointed that she continues to gain wgh -No chest pains or shortness of breath.  Hx of SAH:  She was suppose to see Dr. Kathyrn [name] in 01/2018 with repeat imaging.  Her emergency Medicaid card expired.  She was called by the neurosurgeon's office and was told she has to pay 10% of her outstanding bill before she can be seen.  -checks BP daily:  Gives range of 120s/70. HA only if she is stress   Patient Active Problem List   Diagnosis Date Noted  . Rotator cuff syndrome of right shoulder 06/26/2017  . Type 2 diabetes mellitus without complication, without long-term current use of insulin ([place]) 04/11/2017  . Acute deep vein thrombosis (DVT) of both lower extremities ([place]) 04/11/2017  . Acute blood loss anemia   . Deep vein thrombosis (DVT) ([place])   . Gait disturbance, post-stroke 01/24/2017  . Reactive hypertension   . Quadriparesis ([place])   . Class 1 obesity due to excess calories with serious comorbidity and body mass index (BMI) of 30.0 to 30.9 in adult   . Essential hypertension   . Diabetes mellitus type 2 in obese ([place])   . Seizure ([place])   . 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 Blood Glucose Monitoring Suppl (TRUE METRIX METER) w/Device KIT Use as directed 1 kit 0  . lisinopril (PRINIVIL,ZESTRIL) 5 MG tablet Take 1 tablet (5 mg total) by mouth daily. 90 tablet 3  . metFORMIN (GLUCOPHAGE) 500 MG tablet Take 1 tablet (500 mg total) by mouth daily with breakfast. 90 tablet 3   No current facility-administered medications on file prior to visit.     No Known Allergies  Social History   Socioeconomic History  . Marital status: Single    Spouse name: Not on file  . Number of children: Not on file  . Years of education: Not on file  . Highest education level: Not on file  Occupational History  . Not on file  Social Needs  . Financial resource strain: Not on file  . Food insecurity:    Worry: Not on file    Inability: Not on file  . Transportation needs:    Medical: Not on file    Non-medical: Not on file  Tobacco Use  . Smoking status: Never Smoker  . Smokeless tobacco: Never Used  Substance and Sexual Activity  . Alcohol use: No  . Drug use: No  . Sexual activity: Not on file  Lifestyle  . Physical activity:    Days per week: 0 days    Minutes per session: 0 min  .  Stress: Not at all  Relationships  . Social connections:    Talks on phone: More than three times a week    Gets together: Twice a week    Attends religious service: More than 4 times per year    Active member of club or organization: No    Attends meetings of clubs or organizations: Never    Relationship status: Never married  . Intimate partner violence:    Fear of current or ex partner: Patient refused    Emotionally abused: Patient refused    Physically abused: Patient refused    Forced sexual activity: Patient refused  Other Topics Concern  . Not on file  Social History Narrative  . Not on file    Family History  Problem Relation Age of Onset  . Stroke Mother   . Hypertension Mother     Past Surgical History:  Procedure  Laterality Date  . IR ANGIO INTRA EXTRACRAN SEL INTERNAL CAROTID BILAT MOD SED  12/25/2016  . IR ANGIO VERTEBRAL SEL VERTEBRAL UNI L MOD SED  12/25/2016  . IR ANGIOGRAM FOLLOW UP STUDY  12/25/2016  . IR ANGIOGRAM FOLLOW UP STUDY  12/25/2016  . IR ANGIOGRAM FOLLOW UP STUDY  12/25/2016  . IR ANGIOGRAM FOLLOW UP STUDY  12/25/2016  . IR TRANSCATH/EMBOLIZ  12/25/2016  . RADIOLOGY WITH ANESTHESIA N/A 12/25/2016   Procedure: RADIOLOGY WITH ANESTHESIA;  Surgeon: Consuella Lose, MD;  Location: [facility];  Service: Radiology;  Laterality: N/A;    ROS: Review of Systems Negative except as above. PHYSICAL EXAM: BP 125/85 (BP Location: Left Arm, Patient Position: Sitting, Cuff Size: Normal)   Pulse 85   Temp 99.1 F (37.3 C) (Oral)   Resp 16   Ht '5\' 4"'  (1.626 m)   Wt 203 lb 9.6 oz (92.4 kg)   LMP 04/13/2018   SpO2 97%   BMI 34.95 kg/m   Wt Readings from Last 3 Encounters:  05/08/18 203 lb 9.6 oz (92.4 kg)  11/05/17 196 lb 9.6 oz (89.2 kg)  09/05/17 194 lb (88 kg)  Repeat BP 138/98 Physical Exam  General appearance - alert, well appearing, and in no distress Mental status - normal mood, behavior, speech, dress, motor activity, and thought processes Neck - supple, no significant adenopathy Chest - clear to auscultation, no wheezes, rales or rhonchi, symmetric air entry Heart - normal rate, regular rhythm, normal S1, S2, no murmurs, rubs, clicks or gallops Extremities - peripheral pulses normal, no pedal edema, no clubbing or cyanosis   Results for orders placed or performed in visit on 05/08/18  Glucose (CBG)  Result Value Ref Range   POC Glucose 102 (A) 70 - 99 mg/dl   ASSESSMENT AND [name]: 1. Type 2 diabetes mellitus without complication, without long-term current use of insulin (HCC) At goal.  Continue metformin.  Continue healthy eating habits and regular exercise. - Glucose (CBG) - CBC - Comprehensive metabolic panel - Lipid panel - Hemoglobin A1c  2. Essential  hypertension Repeat blood pressure today was above goal.  Patient reports compliance with lisinopril. I will have her come back to see our clinical pharmacist in about 1 week.  I have requested that she check her blood pressure every day and record the readings to bring in with her.  I also request that she brings her device with her on that visit so that the blood pressure can be checked with her device and with ours.  Good blood pressure control is important given her history of  subarachnoid hemorrhage  3. History of subarachnoid hemorrhage  4. Need for immunization against influenza Given today.   Patient was given the opportunity to ask questions.  Patient verbalized understanding of the [name] and was able to repeat key elements of the [name].   Orders Placed This Encounter  Procedures  . CBC  . Comprehensive metabolic panel  . Lipid panel  . Hemoglobin A1c  . Glucose (CBG)     Requested Prescriptions   Signed Prescriptions Disp Refills  . glucose blood (TRUE METRIX BLOOD GLUCOSE TEST) test strip 100 each 12    Sig: Use as instructed  . TRUEPLUS LANCETS 28G MISC 100 each 4    Sig: Use as directed    Return in about 4 months (around 09/08/2018).  Karle Plumber, MD, FACP

## 2018-05-08 NOTE — Progress Notes (Signed)
Patient is here for a follow-up.   Patient is requesting to get her meter strips and lancets refill to [facility] pharmacy.

## 2018-05-09 ENCOUNTER — Other Ambulatory Visit: Payer: Self-pay | Admitting: Internal Medicine

## 2018-05-09 ENCOUNTER — Telehealth: Payer: Self-pay

## 2018-05-09 LAB — COMPREHENSIVE METABOLIC PANEL WITH GFR
ALT: 9 IU/L (ref 0–32)
AST: 12 IU/L (ref 0–40)
Albumin/Globulin Ratio: 1.7 (ref 1.2–2.2)
Albumin: 4.5 g/dL (ref 3.5–5.5)
Alkaline Phosphatase: 82 IU/L (ref 39–117)
BUN/Creatinine Ratio: 24 — ABNORMAL HIGH (ref 9–23)
BUN: 15 mg/dL (ref 6–24)
Bilirubin Total: 0.4 mg/dL (ref 0.0–1.2)
CO2: 25 mmol/L (ref 20–29)
Calcium: 9.5 mg/dL (ref 8.7–10.2)
Chloride: 101 mmol/L (ref 96–106)
Creatinine, Ser: 0.62 mg/dL (ref 0.57–1.00)
GFR calc Af Amer: 129 mL/min/{1.73_m2} (ref >59)
GFR calc non Af Amer: 112 mL/min/{1.73_m2} (ref >59)
Globulin, Total: 2.7 g/dL (ref 1.5–4.5)
Glucose: 82 mg/dL (ref 65–99)
Potassium: 5 mmol/L (ref 3.5–5.2)
Sodium: 140 mmol/L (ref 134–144)
Total Protein: 7.2 g/dL (ref 6.0–8.5)

## 2018-05-09 LAB — CBC
Hematocrit: 40.7 % (ref 34.0–46.6)
Hemoglobin: 13.5 g/dL (ref 11.1–15.9)
MCH: 26.8 pg (ref 26.6–33.0)
MCHC: 33.2 g/dL (ref 31.5–35.7)
MCV: 81 fL (ref 79–97)
Platelets: 372 10*3/uL (ref 150–450)
RBC: 5.03 x10E6/uL (ref 3.77–5.28)
RDW: 14.4 % (ref 12.3–15.4)
WBC: 8.1 10*3/uL (ref 3.4–10.8)

## 2018-05-09 LAB — HEMOGLOBIN A1C
Est. average glucose Bld gHb Est-mCnc: 120 mg/dL
Hgb A1c MFr Bld: 5.8 % — ABNORMAL HIGH (ref 4.8–5.6)

## 2018-05-09 LAB — LIPID PANEL
Chol/HDL Ratio: 4.7 ratio — ABNORMAL HIGH (ref 0.0–4.4)
Cholesterol, Total: 170 mg/dL (ref 100–199)
HDL: 36 mg/dL — ABNORMAL LOW (ref >39)
LDL Calculated: 80 mg/dL (ref 0–99)
Triglycerides: 270 mg/dL — ABNORMAL HIGH (ref 0–149)
VLDL Cholesterol Cal: 54 mg/dL — ABNORMAL HIGH (ref 5–40)

## 2018-05-09 MED ORDER — ATORVASTATIN CALCIUM 10 MG PO TABS: 10.0000 mg | ORAL_TABLET | Freq: Every day | ORAL | 5 refills | Status: DC

## 2018-05-09 NOTE — Telephone Encounter (Signed)
-----   Message from Ladell Pier, MD sent at 05/09/2018  8:22 AM EDT ----- Let patient know that her blood count is normal meaning no anemia.  Kidney and liver function tests are normal.  Cholesterol profile shows an elevated triglyceride level of 270 with normal being less than 150.  Her LDL cholesterol which is the bad cholesterol is at 80 with goal being less than 70 for diabetics.  I recommend starting a low dose of cholesterol medication called atorvastatin to help get her cholesterol levels at goal.  Prescription has been sent to our pharmacy.

## 2018-05-09 NOTE — Telephone Encounter (Signed)
CMA spoke to patient to inform on results.  Patient verified DOB. Patient understood.  Patient is aware PCP sent a new medication to the [facility] pharmacy.

## 2018-05-15 ENCOUNTER — Ambulatory Visit: Payer: Self-pay | Attending: Family Medicine | Admitting: Pharmacist

## 2018-05-15 DIAGNOSIS — I10 Essential (primary) hypertension: Secondary | ICD-10-CM | POA: Insufficient documentation

## 2018-05-15 NOTE — Progress Notes (Signed)
   S:    PCP: Dr. Wynetta Emery  Patient arrives in good spirits.  Presents to the clinic for hypertension evaluation, counseling, and management. Patient was referred by Dr. Wynetta Emery on 05/08/18. BP 125/85 at that visit.   Denies CP, HA, SOB, or blurred vision. No BLE edema.  Patient reports adherence with medications.  Current BP Medications include:   - Lisinopril 5 mg daily  Antihypertensives tried in the past include:  - no other medications  Dietary habits include:  - Salt: limits, reads nutrition labels  - Caffeine: limits caffeine  Exercise habits include: - Reports 16,000 goal steps daily - 30-60 minutes daily  Family / Social history:  - FH: HTN, stroke (mom) - Tobacco: denies  - Alcohol: denies   Home BP readings:  05/08/18: 125/85 05/09/18:  108/86 05/10/18: 108/81 05/11/18: 110/80 05/12/18: 116/79 05/13/18: 107/80 05/14/18: 112/75  O:  L arm after 5 minutes rest: 110/73, HR 70 L arm with home meter: 117/86, 75  R arm with home: 136/90  Last 3 Office BP readings: BP Readings from Last 3 Encounters:  05/08/18 125/85  11/05/17 123/84  10/14/17 132/90   BMET    Component Value Date/Time   NA 140 05/08/2018 1545   K 5.0 05/08/2018 1545   CL 101 05/08/2018 1545   CO2 25 05/08/2018 1545   GLUCOSE 82 05/08/2018 1545   GLUCOSE 93 02/05/2017 0443   BUN 15 05/08/2018 1545   CREATININE 0.62 05/08/2018 1545   CALCIUM 9.5 05/08/2018 1545   GFRNONAA 112 05/08/2018 1545   GFRAA 129 05/08/2018 1545    Renal function: Estimated Creatinine Clearance: 100.9 mL/min (by C-G formula based on SCr of 0.62 mg/dL).  A/P: Hypertension longstanding appears to be controlled on current medications. BP Goal <130/80 mmHg. Patient is adherent with current medications. Discussed finding with Dr. Wynetta Emery. Home pressures look to be at goal for the most part. Dr. Wynetta Emery is in agreement to continue lisinopril 5 mg for now and have patient follow-up in January.  -Continued lisinopril 5 mg.   -Counseled on lifestyle modifications for blood pressure control including reduced dietary sodium, increased exercise, adequate sleep  Results reviewed and written information provided.   Total time in face-to-face counseling 30 minutes.   F/U Clinic Visit 08/2018.    Patient seen with: Dixon Boos, PharmD Candidate [employer] Class of 2021  Benard Halsted, PharmD, [place] [phone]

## 2018-05-15 NOTE — Patient Instructions (Signed)

## 2018-07-10 ENCOUNTER — Encounter: Payer: Self-pay | Admitting: Family Medicine

## 2018-07-10 ENCOUNTER — Ambulatory Visit: Payer: Self-pay | Attending: Family Medicine | Admitting: Family Medicine

## 2018-07-10 VITALS — BP 116/79 | HR 71 | Temp 99.4°F | Resp 16 | Wt 199.4 lb

## 2018-07-10 DIAGNOSIS — R21 Rash and other nonspecific skin eruption: Secondary | ICD-10-CM

## 2018-07-10 DIAGNOSIS — E119 Type 2 diabetes mellitus without complications: Secondary | ICD-10-CM | POA: Insufficient documentation

## 2018-07-10 DIAGNOSIS — Z823 Family history of stroke: Secondary | ICD-10-CM | POA: Insufficient documentation

## 2018-07-10 DIAGNOSIS — Z8249 Family history of ischemic heart disease and other diseases of the circulatory system: Secondary | ICD-10-CM | POA: Insufficient documentation

## 2018-07-10 DIAGNOSIS — Z8673 Personal history of transient ischemic attack (TIA), and cerebral infarction without residual deficits: Secondary | ICD-10-CM | POA: Insufficient documentation

## 2018-07-10 DIAGNOSIS — I10 Essential (primary) hypertension: Secondary | ICD-10-CM | POA: Insufficient documentation

## 2018-07-10 MED ORDER — CLOTRIMAZOLE-BETAMETHASONE 1-0.05 % EX CREA: 1.0000 "application " | TOPICAL_CREAM | Freq: Two times a day (BID) | CUTANEOUS | 3 refills | Status: DC

## 2018-07-10 NOTE — Progress Notes (Signed)
Subjective:    Patient ID: Aimee [name], female    DOB: May 11, 1976, 42 y.o.   MRN: [id number]  HPI       42 year old female with well-controlled type 2 diabetes for which she takes metformin who presents secondary to complaint of  issue with rash/itchy skin. patient states that she has tried some antifungal creams that do not provide lasting relief.  Patient has  seen rash on the posterior right hand which she believes may be ringworm.  Patient states that she did try antifungal cream to this area as well but the area has not completely cleared but did slightly improve.  Patient believes that the rash on her hand is been present for about 2 to 3 weeks.  Blood sugars have remained well controlled.  Patient has had no change in soaps/lotions/detergents or other hygiene/personal care products.  Patient does not know if any family members have had ringworm or if she has had any exposure to ringworm.  Past Medical History:  Diagnosis Date  . Diabetes mellitus without complication ([place])   . Hypertension   . Stroke [facility])    Past Surgical History:  Procedure Laterality Date  . IR ANGIO INTRA EXTRACRAN SEL INTERNAL CAROTID BILAT MOD SED  12/25/2016  . IR ANGIO VERTEBRAL SEL VERTEBRAL UNI L MOD SED  12/25/2016  . IR ANGIOGRAM FOLLOW UP STUDY  12/25/2016  . IR ANGIOGRAM FOLLOW UP STUDY  12/25/2016  . IR ANGIOGRAM FOLLOW UP STUDY  12/25/2016  . IR ANGIOGRAM FOLLOW UP STUDY  12/25/2016  . IR TRANSCATH/EMBOLIZ  12/25/2016  . RADIOLOGY WITH ANESTHESIA N/A 12/25/2016   Procedure: RADIOLOGY WITH ANESTHESIA;  Surgeon: Consuella Lose, MD;  Location: [place];  Service: Radiology;  Laterality: N/A;   Family History  Problem Relation Age of Onset  . Stroke Mother   . Hypertension Mother    Social History   Tobacco Use  . Smoking status: Never Smoker  . Smokeless tobacco: Never Used  Substance Use Topics  . Alcohol use: No  . Drug use: No   No Known Allergies         Review of  Systems See HPI    Objective:   Physical Exam BP 116/79   Pulse 71   Temp 99.4 F (37.4 C) (Oral)   Resp 16   Wt 199 lb 6.4 oz (90.4 kg)   SpO2 100%   BMI 34.23 kg/m Nurse's notes and vital signs reviewed General-well-nourished, well-developed female no acute distress Lungs-clear to auscultation bilaterally Cardiovascular-regular rate and rhythm Skin- patient with 2 areas on the posterior right hand and one area on the posterior left hand of circular, slightly dry skin each quarter or  nickel sized        Assessment & [name]:  1. Rash and nonspecific skin eruption Discussed with the patient that the rash could also represent nummular eczema rather than ringworm.  Patient provided with prescription for Lotrisone cream to apply twice daily for 10 days but to call the office if the rash does not greatly improved by the end of 10 days of treatment.  Patient may then use the cream as needed.  Patient is also encouraged to keep her skin moisturized with over-the-counter cream such as Cetaphil, Eucerin or Aquaphor and obtain the generic/store brands of these lotions/creams - clotrimazole-betamethasone (LOTRISONE) cream; Apply 1 application topically 2 (two) times daily. For 10 days then as needed  Dispense: 30 g; Refill: 3  An After Visit Summary was printed  and given to the patient.  Return for keep Jan appointment.  with PCP and as needed

## 2018-08-21 ENCOUNTER — Ambulatory Visit: Payer: Self-pay | Attending: Internal Medicine | Admitting: Internal Medicine

## 2018-08-21 ENCOUNTER — Encounter: Payer: Self-pay | Admitting: Internal Medicine

## 2018-08-21 VITALS — BP 128/86 | HR 60 | Temp 98.3°F | Resp 16 | Ht 64.0 in | Wt 197.6 lb

## 2018-08-21 DIAGNOSIS — E782 Mixed hyperlipidemia: Secondary | ICD-10-CM

## 2018-08-21 DIAGNOSIS — E1169 Type 2 diabetes mellitus with other specified complication: Secondary | ICD-10-CM

## 2018-08-21 DIAGNOSIS — I10 Essential (primary) hypertension: Secondary | ICD-10-CM

## 2018-08-21 DIAGNOSIS — E669 Obesity, unspecified: Secondary | ICD-10-CM

## 2018-08-21 DIAGNOSIS — Z6833 Body mass index (BMI) 33.0-33.9, adult: Secondary | ICD-10-CM

## 2018-08-21 DIAGNOSIS — E119 Type 2 diabetes mellitus without complications: Secondary | ICD-10-CM

## 2018-08-21 DIAGNOSIS — Z23 Encounter for immunization: Secondary | ICD-10-CM

## 2018-08-21 DIAGNOSIS — R928 Other abnormal and inconclusive findings on diagnostic imaging of breast: Secondary | ICD-10-CM

## 2018-08-21 LAB — GLUCOSE, POCT (MANUAL RESULT ENTRY): POC Glucose: 93 mg/dL (ref 70–99)

## 2018-08-21 MED ORDER — ATORVASTATIN CALCIUM 10 MG PO TABS: 10.0000 mg | ORAL_TABLET | Freq: Every day | ORAL | 5 refills | Status: DC

## 2018-08-21 MED ORDER — LISINOPRIL 5 MG PO TABS: 5.0000 mg | ORAL_TABLET | Freq: Every day | ORAL | 3 refills | Status: DC

## 2018-08-21 MED ORDER — METFORMIN HCL 500 MG PO TABS: 500.0000 mg | ORAL_TABLET | Freq: Every day | ORAL | 3 refills | Status: DC

## 2018-08-21 MED ORDER — TETANUS-DIPHTH-ACELL PERTUSSIS 5-2.5-18.5 LF-MCG/0.5 IM SUSP: 0.5000 mL | INTRAMUSCULAR | 0 refills | Status: DC

## 2018-08-21 NOTE — Patient Instructions (Addendum)
Purchase and start taking 81 mg aspirin daily.   Please call the BCCCP (breast and cervical cancer control program) at [phone] to schedule diagnostic mammogram     Td Vaccine (Tetanus and Diphtheria): What You Need to Know 1. Why get vaccinated? Tetanus  and diphtheria are very serious diseases. They are rare in the [place] today, but people who do become infected often have severe complications. Td vaccine is used to protect adolescents and adults from both of these diseases. Both tetanus and diphtheria are infections caused by bacteria. Diphtheria spreads from person to person through coughing or sneezing. Tetanus-causing bacteria enter the body through cuts, scratches, or wounds. TETANUS (Lockjaw) causes painful muscle tightening and stiffness, usually all over the body.  It can lead to tightening of muscles in the head and neck so you can't open your mouth, swallow, or sometimes even breathe. Tetanus kills about 1 out of every 10 people who are infected even after receiving the best medical care. DIPHTHERIA can cause a thick coating to form in the back of the throat.  It can lead to breathing problems, paralysis, heart failure, and death. Before vaccines, as many as 200,000 cases of diphtheria and hundreds of cases of tetanus were reported in the [place] each year. Since vaccination began, reports of cases for both diseases have dropped by about 99%. 2. Td vaccine Td vaccine can protect adolescents and adults from tetanus and diphtheria. Td is usually given as a booster dose every 10 years but it can also be given earlier after a severe and dirty wound or burn. Another vaccine, called Tdap, which protects against pertussis in addition to tetanus and diphtheria, is sometimes recommended instead of Td vaccine. Your doctor or the person giving you the vaccine can give you more information. Td may safely be given at the same time as other vaccines. 3. Some people should not  get this vaccine  A person who has ever had a life-threatening allergic reaction after a previous dose of any tetanus or diphtheria containing vaccine, OR has a severe allergy to any part of this vaccine, should not get Td vaccine. Tell the person giving the vaccine about any severe allergies.  Talk to your doctor if you: ? had severe pain or swelling after any vaccine containing diphtheria or tetanus, ? ever had a condition called Guillain Barr Syndrome (GBS), ? aren't feeling well on the day the shot is scheduled. 4. Risks of a vaccine reaction With any medicine, including vaccines, there is a chance of side effects. These are usually mild and go away on their own. Serious reactions are also possible but are rare. Most people who get Td vaccine do not have any problems with it. Mild Problems following Td vaccine: (Did not interfere with activities)  Pain where the shot was given (about 8 people in 10)  Redness or swelling where the shot was given (about 1 person in 4)  Mild fever (rare)  Headache (about 1 person in 4)  Tiredness (about 1 person in 4) Moderate Problems following Td vaccine: (Interfered with activities, but did not require medical attention)  Fever over 102F (rare) Severe Problems following Td vaccine: (Unable to perform usual activities; required medical attention)  Swelling, severe pain, bleeding and/or redness in the arm where the shot was given (rare). Problems that could happen after any vaccine:  People sometimes faint after a medical procedure, including vaccination. Sitting or lying down for about 15 minutes can help prevent fainting, and injuries caused  by a fall. Tell your doctor if you feel dizzy, or have vision changes or ringing in the ears.  Some people get severe pain in the shoulder and have difficulty moving the arm where a shot was given. This happens very rarely.  Any medication can cause a severe allergic reaction. Such reactions from a  vaccine are very rare, estimated at fewer than 1 in a million doses, and would happen within a few minutes to a few hours after the vaccination. As with any medicine, there is a very remote chance of a vaccine causing a serious injury or death. The safety of vaccines is always being monitored. For more information, visit: http://www.aguilar.org/ 5. What if there is a serious reaction? What should I look for?  Look for anything that concerns you, such as signs of a severe allergic reaction, very high fever, or unusual behavior. Signs of a severe allergic reaction can include hives, swelling of the face and throat, difficulty breathing, a fast heartbeat, dizziness, and weakness. These would usually start a few minutes to a few hours after the vaccination. What should I do?  If you think it is a severe allergic reaction or other emergency that can't wait, call 9-1-1 or get the person to the nearest hospital. Otherwise, call your doctor.  Afterward, the reaction should be reported to the Vaccine Adverse Event Reporting System (VAERS). Your doctor might file this report, or you can do it yourself through the VAERS web site at www.vaers.SamedayNews.es, or by calling [phone]. VAERS does not give medical advice. 6. The National Vaccine Injury Compensation Program The [employer] Vaccine Injury Compensation Program (VICP) is a federal program that was created to compensate people who may have been injured by certain vaccines. Persons who believe they may have been injured by a vaccine can learn about the program and about filing a claim by calling 857 757 4737 or visiting the [facility] website at GoldCloset.com.ee. There is a time limit to file a claim for compensation. 7. How can I learn more?  Ask your doctor. He or she can give you the vaccine package insert or suggest other sources of information.  Call your local or state health department.  Contact the Centers for Disease Control and  Prevention (CDC): ? Call 757-842-2382 (1-800-CDC-INFO) ? Visit CDC's website at http://hunter.com/ Vaccine Information Statement Td Vaccine (11/15/15) This information is not intended to replace advice given to you by your health care provider. Make sure you discuss any questions you have with your health care provider. Document Released: 05/20/2006 Document Revised: 03/10/2018 Document Reviewed: 03/10/2018 Elsevier Interactive Patient Education  2019 [employer].

## 2018-08-21 NOTE — Progress Notes (Signed)
Patient ID: Aimee [name], female    DOB: 09-08-1975  MRN: [id number]  CC: Hypertension and Diabetes   Subjective: Aimee [name] is a 43 y.o. female who presents for chronic ds management Her concerns today include:  42 year old FM with history of diabetes type 2, HTN, SAH (12/25/2016 with PCA aneurysm status post coiling), hx DVTLLE and RLE,completed course of Xarelto.  DIABETES TYPE 2 Last A1C:   Results for orders placed or performed in visit on 08/21/18  POCT glucose (manual entry)  Result Value Ref Range   POC Glucose 93 70 - 99 mg/dl    Lab Results  Component Value Date   HGBA1C 5.8 (H) 05/08/2018   Med Adherence:  [x] Yes    [] No Medication side effects:  [] Yes    [x] No Home Monitoring?  [x] Yes, 2-3 x a wk    [] No Home glucose results range:100-110 Diet Adherence: [x] Yes, better.  Less white carbs and sweet breads.  Loss 6 lbs since last visit   [] No Exercise: [x] Yes, 30 mins daily    [] No Hypoglycemic episodes?: [] Yes    [x] No Numbness of the feet? [x] Yes in RT foot since CVA   [] No Retinopathy hx? [] Yes    [x] No Last eye exam:  Over due.  Plans to get done Comments:   HYPERTENSION Currently taking: see medication list Med Adherence: [x] Yes, did no take aas yet for today.     [] No Medication side effects: [] Yes    [x] No Adherence with salt restriction: [x] Yes    [] No Home Monitoring?: [x] Yes, daily    [] No Home BP results range: 105-110/70s SOB? [] Yes    [x] No Chest Pain?: [] Yes    [x] No Leg swelling?: [] Yes    [x] No Headaches?: [] Yes    [x] No Dizziness? [] Yes    [x] No Comments:   HL: LDL was not at goal on last visit.  Started on Lipitor which she is tolerating with occasional muscle cramp.  Due for repeat diagnostic mammogram on the right breast.  Patient Active Problem List   Diagnosis Date Noted  . Rotator cuff syndrome of right shoulder 06/26/2017  . Type 2 diabetes mellitus without  complication, without long-term current use of insulin ([place]) 04/11/2017  . Gait disturbance, post-stroke 01/24/2017  . Reactive hypertension   . Class 1 obesity due to excess calories with serious comorbidity and body mass index (BMI) of 30.0 to 30.9 in adult   . Essential hypertension   . Diabetes mellitus type 2 in obese ([place])   . 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 Blood Glucose Monitoring Suppl (TRUE METRIX METER) w/Device KIT Use as directed 1 kit 0  . clotrimazole-betamethasone (LOTRISONE) cream Apply 1 application topically 2 (two) times daily. For 10 days then as needed 30 g 3  . glucose blood (TRUE METRIX BLOOD GLUCOSE TEST) test strip Use as instructed 100 each 12  . TRUEPLUS LANCETS 28G MISC Use as directed 100 each 4   No current facility-administered medications on file prior to visit.     No Known Allergies  Social History   Socioeconomic History  . Marital status: Single    Spouse name: Not on file  . Number  of children: Not on file  . Years of education: Not on file  . Highest education level: Not on file  Occupational History  . Not on file  Social Needs  . Financial resource strain: Not on file  . Food insecurity:    Worry: Not on file    Inability: Not on file  . Transportation needs:    Medical: Not on file    Non-medical: Not on file  Tobacco Use  . Smoking status: Never Smoker  . Smokeless tobacco: Never Used  Substance and Sexual Activity  . Alcohol use: No  . Drug use: No  . Sexual activity: Not on file  Lifestyle  . Physical activity:    Days per week: 0 days    Minutes per session: 0 min  . Stress: Not at all  Relationships  . Social connections:    Talks on phone: More than three times a week    Gets together: Twice a week    Attends religious service: More than 4 times per year    Active member of club or  organization: No    Attends meetings of clubs or organizations: Never    Relationship status: Never married  . Intimate partner violence:    Fear of current or ex partner: Patient refused    Emotionally abused: Patient refused    Physically abused: Patient refused    Forced sexual activity: Patient refused  Other Topics Concern  . Not on file  Social History Narrative  . Not on file    Family History  Problem Relation Age of Onset  . Stroke Mother   . Hypertension Mother     Past Surgical History:  Procedure Laterality Date  . IR ANGIO INTRA EXTRACRAN SEL INTERNAL CAROTID BILAT MOD SED  12/25/2016  . IR ANGIO VERTEBRAL SEL VERTEBRAL UNI L MOD SED  12/25/2016  . IR ANGIOGRAM FOLLOW UP STUDY  12/25/2016  . IR ANGIOGRAM FOLLOW UP STUDY  12/25/2016  . IR ANGIOGRAM FOLLOW UP STUDY  12/25/2016  . IR ANGIOGRAM FOLLOW UP STUDY  12/25/2016  . IR TRANSCATH/EMBOLIZ  12/25/2016  . RADIOLOGY WITH ANESTHESIA N/A 12/25/2016   Procedure: RADIOLOGY WITH ANESTHESIA;  Surgeon: Consuella Lose, MD;  Location: [place];  Service: Radiology;  Laterality: N/A;    ROS: Review of Systems Negative except as above.  PHYSICAL EXAM: BP 128/86   Pulse 60   Temp 98.3 F (36.8 C) (Oral)   Resp 16   Ht 5' 4" (1.626 m)   Wt 197 lb 9.6 oz (89.6 kg)   SpO2 100%   BMI 33.92 kg/m   Wt Readings from Last 3 Encounters:  08/21/18 197 lb 9.6 oz (89.6 kg)  07/10/18 199 lb 6.4 oz (90.4 kg)  05/08/18 203 lb 9.6 oz (92.4 kg)    Physical Exam  General appearance - alert, well appearing, and in no distress Mental status - normal mood, behavior, speech, dress, motor activity, and thought processes Mouth - mucous membranes moist, pharynx normal without lesions Neck - supple, no significant adenopathy Chest - clear to auscultation, no wheezes, rales or rhonchi, symmetric air entry Heart - normal rate, regular rhythm, normal S1, S2, no murmurs, rubs, clicks or gallops Extremities - peripheral pulses normal, no  pedal edema, no clubbing or cyanosis Diabetic Foot Exam - Simple   Simple Foot Form Visual Inspection See comments:  Yes Sensation Testing Intact to touch and monofilament testing bilaterally:  Yes Pulse Check Posterior Tibialis and Dorsalis pulse intact  bilaterally:  Yes Comments Nonulcerative callous RT big toe medially    Lab Results  Component Value Date   WBC 8.1 05/08/2018   HGB 13.5 05/08/2018   HCT 40.7 05/08/2018   MCV 81 05/08/2018   PLT 372 05/08/2018     Chemistry      Component Value Date/Time   NA 140 05/08/2018 1545   K 5.0 05/08/2018 1545   CL 101 05/08/2018 1545   CO2 25 05/08/2018 1545   BUN 15 05/08/2018 1545   CREATININE 0.62 05/08/2018 1545      Component Value Date/Time   CALCIUM 9.5 05/08/2018 1545   ALKPHOS 82 05/08/2018 1545   AST 12 05/08/2018 1545   ALT 9 05/08/2018 1545   BILITOT 0.4 05/08/2018 1545     Lab Results  Component Value Date   CHOL 170 05/08/2018   HDL 36 (L) 05/08/2018   LDLCALC 80 05/08/2018   TRIG 270 (H) 05/08/2018   CHOLHDL 4.7 (H) 05/08/2018    ASSESSMENT AND [name]: 1. Type 2 diabetes mellitus without complication, without long-term current use of insulin ([place]) 2. Diabetes mellitus type 2 in obese ([place]) Commended her on healthy eating and regular exercise.  Encouraged her to keep up the good work.  Continue current dose of metformin. - POCT glucose (manual entry) - metFORMIN (GLUCOPHAGE) 500 MG tablet; Take 1 tablet (500 mg total) by mouth daily with breakfast.  Dispense: 90 tablet; Refill: 3  3. Essential hypertension Not at goal but patient has not taken medicine as yet for the morning.  She will take it as soon as she returns home. - lisinopril (PRINIVIL,ZESTRIL) 5 MG tablet; Take 1 tablet (5 mg total) by mouth daily.  Dispense: 90 tablet; Refill: 3  4. Need for Tdap vaccination Given  5. Abnormal mammogram of right breast - MM Digital Diagnostic Unilat R; Future  6. Mixed hyperlipidemia - atorvastatin  (LIPITOR) 10 MG tablet; Take 1 tablet (10 mg total) by mouth daily.  Dispense: 30 tablet; Refill: 5  Given her overall risk factors, I recommend taking a daily baby aspirin which she can purchase over-the-counter.   Patient was given the opportunity to ask questions.  Patient verbalized understanding of the [name] and was able to repeat key elements of the [name].   Orders Placed This Encounter  Procedures  . MM Digital Diagnostic Unilat R  . POCT glucose (manual entry)     Requested Prescriptions   Signed Prescriptions Disp Refills  . Tdap (BOOSTRIX) 5-2.5-18.5 LF-MCG/0.5 injection 0.5 mL 0    Sig: Inject 0.5 mLs into the muscle as directed.  Marland Kitchen lisinopril (PRINIVIL,ZESTRIL) 5 MG tablet 90 tablet 3    Sig: Take 1 tablet (5 mg total) by mouth daily.  . metFORMIN (GLUCOPHAGE) 500 MG tablet 90 tablet 3    Sig: Take 1 tablet (500 mg total) by mouth daily with breakfast.  . atorvastatin (LIPITOR) 10 MG tablet 30 tablet 5    Sig: Take 1 tablet (10 mg total) by mouth daily.    Return in about 3 months (around 11/20/2018).  Karle Plumber, MD, FACP

## 2018-08-25 ENCOUNTER — Other Ambulatory Visit (HOSPITAL_COMMUNITY): Payer: Self-pay | Admitting: *Deleted

## 2018-08-25 DIAGNOSIS — N6310 Unspecified lump in the right breast, unspecified quadrant: Secondary | ICD-10-CM

## 2018-09-08 ENCOUNTER — Ambulatory Visit: Payer: Self-pay | Admitting: Internal Medicine

## 2018-09-18 ENCOUNTER — Ambulatory Visit
Admission: RE | Admit: 2018-09-18 | Discharge: 2018-09-18 | Disposition: A | Discharge status: Home / Self Care | Payer: PRIVATE HEALTH INSURANCE | Source: Ambulatory Visit | Attending: Obstetrics and Gynecology | Admitting: Obstetrics and Gynecology

## 2018-09-18 ENCOUNTER — Ambulatory Visit
Admission: RE | Admit: 2018-09-18 | Discharge: 2018-09-18 | Disposition: A | Discharge status: Home / Self Care | Payer: Self-pay | Source: Ambulatory Visit | Attending: Obstetrics and Gynecology | Admitting: Obstetrics and Gynecology

## 2018-09-18 ENCOUNTER — Ambulatory Visit (HOSPITAL_COMMUNITY)
Admission: RE | Admit: 2018-09-18 | Discharge: 2018-09-18 | Disposition: A | Discharge status: Home / Self Care | Payer: Self-pay | Source: Ambulatory Visit | Attending: Obstetrics and Gynecology | Admitting: Obstetrics and Gynecology

## 2018-09-18 ENCOUNTER — Encounter (HOSPITAL_COMMUNITY): Payer: Self-pay

## 2018-09-18 VITALS — BP 126/76 | Ht 64.0 in | Wt 196.0 lb

## 2018-09-18 DIAGNOSIS — N6310 Unspecified lump in the right breast, unspecified quadrant: Secondary | ICD-10-CM

## 2018-09-18 DIAGNOSIS — Z1239 Encounter for other screening for malignant neoplasm of breast: Secondary | ICD-10-CM

## 2018-09-18 NOTE — Patient Instructions (Signed)
Explained breast self awareness with [name]. Patient did not need a Pap smear today due to last Pap smear and HPV typing was 04/23/2017. Let her know BCCCP will cover Pap smears and HPV typing every 5 years unless has a history of abnormal Pap smears. Referred patient to the [facility] for a bilateral breast diagnostic mammogram and right breast ultrasound per recommendation. Appointment scheduled for Thursday, September 18, 2018 at 1050. Patient aware of appointment and will be there. Ma. Bunnie Pion Trejo verbalized understanding.  Katilynn Sinkler, Arvil [place], RN 11:08 AM

## 2018-09-18 NOTE — Progress Notes (Signed)
Patient referred to [facility] by the [facility] due to recommending 75-month diagnostic mammogram and ultrasound of the right breast. Screening mammogram completed 08/09/2017.Last right breast diagnostic mammogram and ultrasound completed 03/06/2018.  Pap Smear: Pap smear not completed today. Last Pap smear was 04/23/2017 at [facility] and normal with negative HPV. Per patient has no history of an abnormal Pap smear. Last Pap smear result is in Epic.  Physical exam: Breasts Breasts symmetrical. No skin abnormalities bilateral breasts. No nipple retraction bilateral breasts. No nipple discharge bilateral breasts. No lymphadenopathy. No lumps palpated bilateral breasts. No complaints of pain or tenderness on exam. Referred patient to the [facility] for a bilateral breast diagnostic mammogram and right breast ultrasound per recommendation. Appointment scheduled for Thursday, September 18, 2018 at 1050.        Pelvic/Bimanual No Pap smear completed today since last Pap smear and HPV typing was 04/23/2017. Pap smear not indicated per BCCCP guidelines.  Smoking History: Patient has never smoked.  Patient Navigation: Patient education provided. Access to services provided for patient through BCCCP program.   Breast and Cervical Cancer Risk Assessment: Patient has no family history of breast cancer, known genetic mutations, or radiation treatment to the chest before age 49. Patient has no history of cervical dysplasia, immunocompromised, or DES exposure in-utero.  Risk Assessment    Risk Scores      09/18/2018   Last edited by: Loletta Parish, RN   5-year risk: 0.5 %   Lifetime risk: 8.5 %

## 2018-09-19 ENCOUNTER — Encounter (HOSPITAL_COMMUNITY): Payer: Self-pay | Admitting: *Deleted

## 2018-10-20 ENCOUNTER — Telehealth: Payer: Self-pay | Admitting: Internal Medicine

## 2018-10-20 DIAGNOSIS — I10 Essential (primary) hypertension: Secondary | ICD-10-CM

## 2018-10-20 DIAGNOSIS — E119 Type 2 diabetes mellitus without complications: Secondary | ICD-10-CM

## 2018-10-20 MED ORDER — METFORMIN HCL 500 MG PO TABS: 500.0000 mg | ORAL_TABLET | Freq: Every day | ORAL | 2 refills | Status: DC

## 2018-10-20 MED ORDER — LISINOPRIL 5 MG PO TABS: 5.0000 mg | ORAL_TABLET | Freq: Every day | ORAL | 2 refills | Status: DC

## 2018-10-20 NOTE — Telephone Encounter (Signed)
1) Medication(s) Requested (by name): lisinopril (PRINIVIL,ZESTRIL) 5 MG tablet metFORMIN (GLUCOPHAGE) 500 MG   2) Pharmacy of Choice: [facility] on [address]) Special Requests:   Approved medications will be sent to the pharmacy, we will reach out if there is an issue.  Requests made after 3pm may not be addressed until the following business day!  If a patient is unsure of the name of the medication(s) please note and ask patient to call back when they are able to provide all info, do not send to responsible party until all information is available!

## 2018-11-20 ENCOUNTER — Ambulatory Visit: Payer: Self-pay | Admitting: Internal Medicine

## 2018-12-16 ENCOUNTER — Other Ambulatory Visit: Payer: Self-pay

## 2018-12-16 ENCOUNTER — Encounter: Payer: Self-pay | Admitting: Internal Medicine

## 2018-12-16 ENCOUNTER — Ambulatory Visit: Payer: Self-pay | Attending: Internal Medicine | Admitting: Internal Medicine

## 2018-12-16 DIAGNOSIS — D241 Benign neoplasm of right breast: Secondary | ICD-10-CM

## 2018-12-16 DIAGNOSIS — R0981 Nasal congestion: Secondary | ICD-10-CM

## 2018-12-16 DIAGNOSIS — E1169 Type 2 diabetes mellitus with other specified complication: Secondary | ICD-10-CM

## 2018-12-16 DIAGNOSIS — Z7189 Other specified counseling: Secondary | ICD-10-CM

## 2018-12-16 DIAGNOSIS — E669 Obesity, unspecified: Secondary | ICD-10-CM

## 2018-12-16 DIAGNOSIS — E782 Mixed hyperlipidemia: Secondary | ICD-10-CM

## 2018-12-16 DIAGNOSIS — I10 Essential (primary) hypertension: Secondary | ICD-10-CM

## 2018-12-16 DIAGNOSIS — G4700 Insomnia, unspecified: Secondary | ICD-10-CM | POA: Insufficient documentation

## 2018-12-16 MED ORDER — METFORMIN HCL 500 MG PO TABS: 500.0000 mg | ORAL_TABLET | Freq: Every day | ORAL | 6 refills | Status: DC

## 2018-12-16 MED ORDER — GLUCOSE BLOOD VI STRP: ORAL_STRIP | 12 refills | Status: DC

## 2018-12-16 MED ORDER — LISINOPRIL 5 MG PO TABS: 5.0000 mg | ORAL_TABLET | Freq: Every day | ORAL | 6 refills | Status: DC

## 2018-12-16 MED ORDER — ATORVASTATIN CALCIUM 10 MG PO TABS: 10.0000 mg | ORAL_TABLET | Freq: Every day | ORAL | 6 refills | Status: DC

## 2018-12-16 MED ORDER — FLUTICASONE PROPIONATE 50 MCG/ACT NA SUSP: 1.0000 | Freq: Every day | NASAL | 1 refills | Status: DC | PRN

## 2018-12-16 NOTE — Progress Notes (Signed)
Virtual Visit via Telephone Note Due to current restrictions/limitations of in-office visits due to the COVID-19 pandemic, this scheduled clinical appointment was converted to a telehealth visit  I connected with Aimee [name] on 12/16/18 at 8:30 a.m EDT by telephone and verified that I am speaking with the correct person using two identifiers. I am in my office.  The patient is at home.  Only the patient and myself participated in this encounter.  I discussed the limitations, risks, security and privacy concerns of performing an evaluation and management service by telephone and the availability of in person appointments. I also discussed with the patient that there may be a patient responsible charge related to this service. The patient expressed understanding and agreed to proceed.   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 Patient was last seen in January.   HTN:  Was using [place] mouth wash for 1 wk.  Noted that her BP became more elevated while using it so she stopped using.  She noted that it had a lot of sodium in it -checks BP daily.  Last 3 day readings:  120/79, 125/82, 121/83, 104/71 -compliant with med - Lisinopril CP/SOB/LE edema  DM: last A1C 5.8 Checks BS 3 x a wk.  Gives range:  97,98, 92, 102, 92.  Wgh is 196 lb unchanged from last visit. -walking about 40 mins every day -taking Metformin -some residual numbness in RT leg since [facility].  Goes away when she gets moving  C/o problems sleeping since COVID 19. Usually gets in bed around 11p.m-12 a.m but over past mth, she does not have to get up early for work so she has been getting in bed much later. She sleeps with relaxing music on.  She turns off television on her cell phone. She drinks decaf coffee in the evenings.  C/o nasal congestion that is worse in mornings.  Using Claritin  which helps a little  Had MMG/US done 09/2018.  Fibroadenoma in RT upper breast stable.  Plan is for repeat imaging in 1 year Observations/Objective: Lab Results  Component Value Date   WBC 8.1 05/08/2018   HGB 13.5 05/08/2018   HCT 40.7 05/08/2018   MCV 81 05/08/2018   PLT 372 05/08/2018     Chemistry      Component Value Date/Time   NA 140 05/08/2018 1545   K 5.0 05/08/2018 1545   CL 101 05/08/2018 1545   CO2 25 05/08/2018 1545   BUN 15 05/08/2018 1545   CREATININE 0.62 05/08/2018 1545      Component Value Date/Time   CALCIUM 9.5 05/08/2018 1545   ALKPHOS 82 05/08/2018 1545   AST 12 05/08/2018 1545   ALT 9 05/08/2018 1545   BILITOT 0.4 05/08/2018 1545       Assessment and Plan: 1. Diabetes mellitus type 2 in obese [facility]) Reported blood sugars are at goal. Encourage her to continue healthy eating habits and regular exercise as she has been doing. - glucose blood (TRUE METRIX BLOOD GLUCOSE TEST) test strip; Use as instructed  Dispense: 100 each; Refill: 12  2. Essential hypertension Most blood pressure readings that she reported are at goal of 130/80 or lower.  Continue current dose of lisinopril and low-salt diet - lisinopril (ZESTRIL) 5 MG tablet; Take 1 tablet (5 mg total) by mouth daily.  Dispense: 30 tablet; Refill: 6  3. Insomnia, unspecified type Counseling  given about good sleep hygiene.  Advised to get in bed around about the same time every night, turning off all lights and sounds, avoid drinking caffeinated beverages within 4 to 5 hours of bedtime, avoid exercising close to bedtime.  If unable to fall asleep after 45 minutes of being in bed, patient advised to get up and do something until she feels sleepy.  Can try melatonin over-the-counter  4. Mixed hyperlipidemia - atorvastatin (LIPITOR) 10 MG tablet; Take 1 tablet (10 mg total) by mouth daily.  Dispense: 30 tablet; Refill: 6  5. Fibroadenoma of right breast Plan for repeat imaging in 1 year as  recommended by radiology  6. Sinus congestion Continue Claritin.  Add Flonase - fluticasone (FLONASE) 50 MCG/ACT nasal spray; Place 1 spray into both nostrils daily as needed for allergies or rhinitis.  Dispense: 16 g; Refill: 1  7. Educated About Covid-19 Virus Infection Encourage patient to adhere to social distancing and good handwashing.  Avoid sick contacts   Follow Up Instructions: F/u in 3 mths   I discussed the assessment and treatment plan with the patient. The patient was provided an opportunity to ask questions and all were answered. The patient agreed with the plan and demonstrated an understanding of the instructions.   The patient was advised to call back or seek an in-person evaluation if the symptoms worsen or if the condition fails to improve as anticipated.  I provided 23 minutes of non-face-to-face time during this encounter.   Karle Plumber, MD

## 2019-08-14 ENCOUNTER — Telehealth: Payer: Self-pay

## 2019-08-14 DIAGNOSIS — E782 Mixed hyperlipidemia: Secondary | ICD-10-CM

## 2019-08-14 DIAGNOSIS — I10 Essential (primary) hypertension: Secondary | ICD-10-CM

## 2019-08-14 NOTE — Telephone Encounter (Signed)
Patient called to make a medication refill for   metFORMIN (GLUCOPHAGE) 500 MG tablet  lisinopril (ZESTRIL) 5 MG tablet  atorvastatin (LIPITOR) 10 MG tablet    Patient uses [place] [address]  [address] [place] [address]   Please advice [phone]

## 2019-08-14 NOTE — Telephone Encounter (Signed)
May you fill if appropriate  °

## 2019-08-17 MED ORDER — LISINOPRIL 5 MG PO TABS: 5.0000 mg | ORAL_TABLET | Freq: Every day | ORAL | 0 refills | Status: DC

## 2019-08-17 MED ORDER — METFORMIN HCL 500 MG PO TABS: 500.0000 mg | ORAL_TABLET | Freq: Every day | ORAL | 0 refills | Status: DC

## 2019-08-17 MED ORDER — ATORVASTATIN CALCIUM 10 MG PO TABS: 10.0000 mg | ORAL_TABLET | Freq: Every day | ORAL | 0 refills | Status: DC

## 2019-08-17 NOTE — Addendum Note (Signed)
Addended by: Karle Plumber B on: 08/17/2019 08:51 PM   Modules accepted: Orders

## 2019-08-17 NOTE — Telephone Encounter (Signed)
Patient last seen in 12/2018 with no planned appointments. Refills will have to come from Dr. Wynetta Emery.

## 2019-08-17 NOTE — Telephone Encounter (Signed)
Please fill if appropriate.  

## 2019-08-18 NOTE — Telephone Encounter (Signed)
Could you please schedule pt an appointment

## 2019-09-15 ENCOUNTER — Other Ambulatory Visit: Payer: Self-pay

## 2019-09-15 ENCOUNTER — Ambulatory Visit: Payer: Self-pay | Attending: Internal Medicine | Admitting: Internal Medicine

## 2019-09-15 DIAGNOSIS — I10 Essential (primary) hypertension: Secondary | ICD-10-CM

## 2019-09-15 DIAGNOSIS — R040 Epistaxis: Secondary | ICD-10-CM

## 2019-09-15 DIAGNOSIS — E669 Obesity, unspecified: Secondary | ICD-10-CM

## 2019-09-15 DIAGNOSIS — E782 Mixed hyperlipidemia: Secondary | ICD-10-CM

## 2019-09-15 DIAGNOSIS — E1169 Type 2 diabetes mellitus with other specified complication: Secondary | ICD-10-CM

## 2019-09-15 MED ORDER — METFORMIN HCL 500 MG PO TABS: 500.0000 mg | ORAL_TABLET | Freq: Every day | ORAL | 1 refills | Status: DC

## 2019-09-15 MED ORDER — LISINOPRIL 5 MG PO TABS: 5.0000 mg | ORAL_TABLET | Freq: Every day | ORAL | 1 refills | Status: DC

## 2019-09-15 MED ORDER — ATORVASTATIN CALCIUM 10 MG PO TABS: 10.0000 mg | ORAL_TABLET | Freq: Every day | ORAL | 6 refills | Status: DC

## 2019-09-15 NOTE — Progress Notes (Signed)
Virtual Visit via Telephone Note Due to current restrictions/limitations of in-office visits due to the COVID-19 pandemic, this scheduled clinical appointment was converted to a telehealth visit  I connected with Aimee [name] on 09/15/19 at 4:59 p.m by telephone and verified that I am speaking with the correct person using two identifiers. I am in my office.  The patient is at home.  Only the patient and myself participated in this encounter.  I discussed the limitations, risks, security and privacy concerns of performing an evaluation and management service by telephone and the availability of in person appointments. I also discussed with the patient that there may be a patient responsible charge related to this service. The patient expressed understanding and agreed to proceed.   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  '[x]'  Yes - QOD   Home glucose results range: 98-150 Diet Adherence: '[x]'  Yes, however, she has gained 9 lbs (currently at 205 lbs).  Drinks mainly water.  Eats 3-4 tortias a day. Loves bread.  Not much rice, eating more veggies Exercise: '[x]'  Yes -does a lot of walking at work.  She sanitizes a large building  Hypoglycemic episodes?: '[]'  Yes    '[x]'  No Numbness of the feet? '[]'  Yes    '[x]'  No Retinopathy hx? '[]'  Yes    '[]'  No Last eye exam:  No blurred vision.  She is overdue for eye exam.  She plans to schedule an eye appt Comments:   HYPERTENSION Currently taking: see medication list.  She is on lisinopril Med Adherence: '[x]'  Yes    '[]'   No Medication side effects: '[]'  Yes    '[x]'  No Adherence with salt restriction: '[x]'  Yes    '[]'  No Home Monitoring?: '[x]'  Yes    '[]'  No Monitoring Frequency:  daily Home BP results range:  116/77 this a.m, yesterday 117/71 SOB? '[]'  Yes    '[x]'  No Chest Pain?: '[]'  Yes    '[x]'  No Leg swelling?: '[]'  Yes    '[x]'  No Headaches?: '[]'  Yes    '[x]'  No Dizziness? '[]'  Yes    '[x]'  No Comments:   Some RT nasal congestion at times. Other times dry.  She had a sinus infection back in November for which she was seen at an urgent care and was treated with antibiotics.  Since December she has had several intermittent episodes of mild epistaxis from the right nostril.  Flonase nasal spray is on her med list but she states that she has not been using that.  She is not on aspirin.  She sometimes picks the nose  HM:  Receive flu shot at [facility] in 04/2019 Outpatient Encounter Medications as of 09/15/2019  Medication Sig  . atorvastatin (LIPITOR) 10 MG tablet Take 1 tablet (10 mg total) by mouth daily.  . Blood Glucose Monitoring Suppl (TRUE METRIX METER) w/Device KIT Use as directed  . clotrimazole-betamethasone (LOTRISONE) cream Apply 1 application topically 2 (two) times daily. For 10 days then as needed  . fluticasone (FLONASE)  50 MCG/ACT nasal spray Place 1 spray into both nostrils daily as needed for allergies or rhinitis.  Marland Kitchen glucose blood (TRUE METRIX BLOOD GLUCOSE TEST) test strip Use as instructed  . lisinopril (ZESTRIL) 5 MG tablet Take 1 tablet (5 mg total) by mouth daily.  . metFORMIN (GLUCOPHAGE) 500 MG tablet Take 1 tablet (500 mg total) by mouth daily with breakfast.  . Tdap (BOOSTRIX) 5-2.5-18.5 LF-MCG/0.5 injection Inject 0.5 mLs into the muscle as directed. (Patient not taking: Reported on 09/18/2018)  . TRUEPLUS LANCETS 28G MISC Use as directed   No facility-administered encounter medications on file as of 09/15/2019.      Observations/Objective: No direct observation done as this was a telephone  encounter  Assessment and Plan: 1. Diabetes mellitus type 2 in obese [facility]) -She will continue Metformin.  She will come to the lab later this week to have blood test done. Advised her to change to whole-grain wheat bread.  She will try to cut back on tortillas to no more than 2 a day.  Advised to avoid sugary drinks and limit her portion sizes.  Encouraged her to continue to be as active as she can - CBC; Future - Comprehensive metabolic panel; Future - Lipid panel; Future - Hemoglobin A1c; Future - metFORMIN (GLUCOPHAGE) 500 MG tablet; Take 1 tablet (500 mg total) by mouth daily with breakfast.  Dispense: 90 tablet; Refill: 1  2. Mixed hyperlipidemia Continue Lipitor - atorvastatin (LIPITOR) 10 MG tablet; Take 1 tablet (10 mg total) by mouth daily.  Dispense: 30 tablet; Refill: 6  3. Essential hypertension Reported home blood pressure readings are at goal.  Continue lisinopril - lisinopril (ZESTRIL) 5 MG tablet; Take 1 tablet (5 mg total) by mouth daily.  Dispense: 90 tablet; Refill: 1  4. Epistaxis Advised to avoid picking the nose. Advised not to use the Flonase nasal spray. She can purchase some antibiotic ointment over-the-counter and use it in the right nostril as needed whenever there is dryness.  Having a humidifier in the room at night may also help. She will follow up with me in about 6 weeks to see whether these episodes have resolved.  If not we will refer to ENT   Follow Up Instructions: 6 weeks   I discussed the assessment and treatment plan with the patient. The patient was provided an opportunity to ask questions and all were answered. The patient agreed with the plan and demonstrated an understanding of the instructions.   The patient was advised to call back or seek an in-person evaluation if the symptoms worsen or if the condition fails to improve as anticipated.  I provided 21 minutes of non-face-to-face time during this encounter.   Karle Plumber, MD

## 2019-09-17 ENCOUNTER — Ambulatory Visit: Payer: Self-pay | Attending: Internal Medicine

## 2019-09-17 ENCOUNTER — Ambulatory Visit: Payer: Self-pay | Admitting: Internal Medicine

## 2019-09-17 ENCOUNTER — Other Ambulatory Visit: Payer: Self-pay

## 2019-09-17 ENCOUNTER — Ambulatory Visit: Payer: Self-pay

## 2019-09-17 DIAGNOSIS — E669 Obesity, unspecified: Secondary | ICD-10-CM

## 2019-09-17 DIAGNOSIS — E1169 Type 2 diabetes mellitus with other specified complication: Secondary | ICD-10-CM

## 2019-09-18 LAB — COMPREHENSIVE METABOLIC PANEL WITH GFR
ALT: 13 IU/L (ref 0–32)
AST: 14 IU/L (ref 0–40)
Albumin/Globulin Ratio: 1.6 (ref 1.2–2.2)
Albumin: 4.4 g/dL (ref 3.8–4.8)
Alkaline Phosphatase: 79 IU/L (ref 39–117)
BUN/Creatinine Ratio: 19 (ref 9–23)
BUN: 13 mg/dL (ref 6–24)
Bilirubin Total: 0.7 mg/dL (ref 0.0–1.2)
CO2: 19 mmol/L — ABNORMAL LOW (ref 20–29)
Calcium: 9.4 mg/dL (ref 8.7–10.2)
Chloride: 104 mmol/L (ref 96–106)
Creatinine, Ser: 0.67 mg/dL (ref 0.57–1.00)
GFR calc Af Amer: 125 mL/min/{1.73_m2} (ref >59)
GFR calc non Af Amer: 108 mL/min/{1.73_m2} (ref >59)
Globulin, Total: 2.8 g/dL (ref 1.5–4.5)
Glucose: 93 mg/dL (ref 65–99)
Potassium: 5 mmol/L (ref 3.5–5.2)
Sodium: 139 mmol/L (ref 134–144)
Total Protein: 7.2 g/dL (ref 6.0–8.5)

## 2019-09-18 LAB — CBC
Hematocrit: 42.5 % (ref 34.0–46.6)
Hemoglobin: 14.5 g/dL (ref 11.1–15.9)
MCH: 28.3 pg (ref 26.6–33.0)
MCHC: 34.1 g/dL (ref 31.5–35.7)
MCV: 83 fL (ref 79–97)
Platelets: 372 10*3/uL (ref 150–450)
RBC: 5.13 x10E6/uL (ref 3.77–5.28)
RDW: 14.4 % (ref 11.7–15.4)
WBC: 7.9 10*3/uL (ref 3.4–10.8)

## 2019-09-18 LAB — LIPID PANEL
Chol/HDL Ratio: 2.7 ratio (ref 0.0–4.4)
Cholesterol, Total: 106 mg/dL (ref 100–199)
HDL: 40 mg/dL (ref >39)
LDL Chol Calc (NIH): 42 mg/dL (ref 0–99)
Triglycerides: 137 mg/dL (ref 0–149)
VLDL Cholesterol Cal: 24 mg/dL (ref 5–40)

## 2019-09-18 LAB — HEMOGLOBIN A1C
Est. average glucose Bld gHb Est-mCnc: 123 mg/dL
Hgb A1c MFr Bld: 5.9 % — ABNORMAL HIGH (ref 4.8–5.6)

## 2019-09-24 ENCOUNTER — Telehealth: Payer: Self-pay

## 2019-09-24 NOTE — Telephone Encounter (Signed)
-----   Message from Ladell Pier, MD sent at 09/20/2019 11:10 AM EST ----- Let patient know that her A1c is 5.9 which is good.  The goal is to be less than 7.  Her total and LDL cholesterol are normal.  Her kidney and liver function tests are normal.  Her blood count is normal meaning no anemia.

## 2019-09-24 NOTE — Telephone Encounter (Signed)
Patient has voicemail that is not set up yet.

## 2019-10-02 ENCOUNTER — Encounter: Payer: Self-pay | Admitting: Internal Medicine

## 2019-10-02 LAB — HM DIABETES EYE EXAM

## 2019-10-22 ENCOUNTER — Ambulatory Visit: Payer: Self-pay | Attending: Internal Medicine

## 2019-10-22 DIAGNOSIS — Z23 Encounter for immunization: Secondary | ICD-10-CM

## 2019-10-22 NOTE — Progress Notes (Signed)
   Covid-19 Vaccination Clinic  Name:  Gaylyn Cheers. Ranim Hertling    MRN: [id number] DOB: 11-02-1975  10/22/2019  Ms. Lonnette Bonadio was observed post Covid-19 immunization for 15 minutes without incident. She was provided with Vaccine Information Sheet and instruction to access the V-Safe system.   Ms. Brettney Sanderlin was instructed to call 911 with any severe reactions post vaccine: Marland Kitchen Difficulty breathing  . Swelling of face and throat  . A fast heartbeat  . A bad rash all over body  . Dizziness and weakness   Immunizations Administered    Name Date Dose VIS Date Route   Pfizer COVID-19 Vaccine 10/22/2019  1:27 PM 0.3 mL 07/17/2019 Intramuscular   Manufacturer: [place]   Lot: [id number]   [place]: [id number]

## 2019-10-30 ENCOUNTER — Ambulatory Visit: Payer: Self-pay | Attending: Internal Medicine | Admitting: Internal Medicine

## 2019-10-30 ENCOUNTER — Other Ambulatory Visit: Payer: Self-pay

## 2019-10-30 ENCOUNTER — Encounter: Payer: Self-pay | Admitting: Internal Medicine

## 2019-10-30 VITALS — BP 122/84 | HR 80 | Temp 98.2°F | Resp 18 | Ht 64.0 in | Wt 208.0 lb

## 2019-10-30 DIAGNOSIS — R040 Epistaxis: Secondary | ICD-10-CM

## 2019-10-30 DIAGNOSIS — E1169 Type 2 diabetes mellitus with other specified complication: Secondary | ICD-10-CM

## 2019-10-30 DIAGNOSIS — E669 Obesity, unspecified: Secondary | ICD-10-CM

## 2019-10-30 DIAGNOSIS — I10 Essential (primary) hypertension: Secondary | ICD-10-CM

## 2019-10-30 LAB — GLUCOSE, POCT (MANUAL RESULT ENTRY): POC Glucose: 104 mg/dL — AB (ref 70–99)

## 2019-10-30 NOTE — Progress Notes (Signed)
DM and HTN F /u   GBC 104

## 2019-10-30 NOTE — Progress Notes (Signed)
Patient ID: Aimee [name], female    DOB: 06-29-1976  MRN: [id number]  CC: Diabetes   Subjective: Aimee [name] is a 44 y.o. 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  She reported picking her nose.  I advised her to discontinue picking the nose and to purchase some antibiotic ointment over-the-counter and using it in the nostril when there is dryness.  -Since then she has not had any further major epistaxis.  She states that every now and again she would noticed a little streaks of blood when she blows her nose.  She feels that the antibiotic ointment has helped and not picking her nose also has helped.   Went over lab test results that were done last month.  Her blood count was normal.  Kidney and liver function tests normal.  Lipid profile at goal.  A1c was 5.9.  Blood pressure noted to be elevated today.  She reports compliance with medication.  She states that her blood pressure this morning was 113/78 Patient Active Problem List   Diagnosis Date Noted  . Fibroadenoma of right breast 12/16/2018  . Insomnia 12/16/2018  . Rotator cuff syndrome of right shoulder 06/26/2017  . Type 2 diabetes mellitus without complication, without long-term current use of insulin ([place]) 04/11/2017  . Reactive hypertension   . Class 1 obesity due to excess calories with serious comorbidity and body mass index (BMI) of 30.0 to 30.9 in adult   . Essential hypertension   . Diabetes mellitus type 2 in obese ([place])   . Subarachnoid hemorrhage due to ruptured aneurysm ([place]) 12/25/2016     Current Outpatient Medications on File Prior to Visit  Medication Sig Dispense  Refill  . atorvastatin (LIPITOR) 10 MG tablet Take 1 tablet (10 mg total) by mouth daily. 30 tablet 6  . Blood Glucose Monitoring Suppl (TRUE METRIX METER) w/Device KIT Use as directed 1 kit 0  . clotrimazole-betamethasone (LOTRISONE) cream Apply 1 application topically 2 (two) times daily. For 10 days then as needed 30 g 3  . glucose blood (TRUE METRIX BLOOD GLUCOSE TEST) test strip Use as instructed 100 each 12  . lisinopril (ZESTRIL) 5 MG tablet Take 1 tablet (5 mg total) by mouth daily. 90 tablet 1  . metFORMIN (GLUCOPHAGE) 500 MG tablet Take 1 tablet (500 mg total) by mouth daily with breakfast. 90 tablet 1  . TRUEPLUS LANCETS 28G MISC Use as directed 100 each 4  . Tdap (BOOSTRIX) 5-2.5-18.5 LF-MCG/0.5 injection Inject 0.5 mLs into the muscle as directed. (Patient not taking: Reported on 09/18/2018) 0.5 mL 0   No current facility-administered medications on file prior to visit.    No Known Allergies  Social History   Socioeconomic History  . Marital status: Single    Spouse name: Not on file  . Number of children: 0  . Years of education: Not on file  . Highest education level: Bachelor's degree (e.g., BA, AB, BS)  Occupational History  . Not on file  Tobacco Use  . Smoking status: Never Smoker  . Smokeless tobacco: Never Used  Substance and Sexual Activity  . Alcohol use: No  . Drug use: No  . Sexual activity: Not  Currently  Other Topics Concern  . Not on file  Social History Narrative  . Not on file   Social Determinants of Health   Financial Resource Strain:   . Difficulty of Paying Living Expenses:   Food Insecurity:   . Worried About Charity fundraiser in the Last Year:   . Arboriculturist in the Last Year:   Transportation Needs:   . Film/video editor (Medical):   Marland Kitchen Lack of Transportation (Non-Medical):   Physical Activity:   . Days of Exercise per Week:   . Minutes of Exercise per Session:   Stress:   . Feeling of Stress :   Social Connections:     . Frequency of Communication with Friends and Family:   . Frequency of Social Gatherings with Friends and Family:   . Attends Religious Services:   . Active Member of Clubs or Organizations:   . Attends Archivist Meetings:   Marland Kitchen Marital Status:   Intimate Partner Violence:   . Fear of Current or Ex-Partner:   . Emotionally Abused:   Marland Kitchen Physically Abused:   . Sexually Abused:     Family History  Problem Relation Age of Onset  . Stroke Mother   . Hypertension Mother   . Diabetes Father     Past Surgical History:  Procedure Laterality Date  . IR ANGIO INTRA EXTRACRAN SEL INTERNAL CAROTID BILAT MOD SED  12/25/2016  . IR ANGIO VERTEBRAL SEL VERTEBRAL UNI L MOD SED  12/25/2016  . IR ANGIOGRAM FOLLOW UP STUDY  12/25/2016  . IR ANGIOGRAM FOLLOW UP STUDY  12/25/2016  . IR ANGIOGRAM FOLLOW UP STUDY  12/25/2016  . IR ANGIOGRAM FOLLOW UP STUDY  12/25/2016  . IR TRANSCATH/EMBOLIZ  12/25/2016  . RADIOLOGY WITH ANESTHESIA N/A 12/25/2016   Procedure: RADIOLOGY WITH ANESTHESIA;  Surgeon: Consuella Lose, MD;  Location: [facility];  Service: Radiology;  Laterality: N/A;    ROS: Review of Systems Negative except as stated above  PHYSICAL EXAM: BP 122/84   Pulse 80   Temp 98.2 F (36.8 C)   Resp 18   Ht '5\' 4"'  (1.626 m)   Wt 208 lb (94.3 kg)   LMP 09/21/2019   SpO2 98%   BMI 35.70 kg/m   Physical Exam  General appearance - alert, well appearing, and in no distress Mental status - normal mood, behavior, speech, dress, motor activity, and thought processes Nose - normal and patent, no erythema, discharge or polyps Mouth - mucous membranes moist, pharynx normal without lesions Chest - clear to auscultation, no wheezes, rales or rhonchi, symmetric air entry Heart - normal rate, regular rhythm, normal S1, S2, no murmurs, rubs, clicks or gallops  CMP Latest Ref Rng & Units 09/17/2019 05/08/2018 02/05/2017  Glucose 65 - 99 mg/dL 93 82 93  BUN 6 - 24 mg/dL '13 15 10  ' Creatinine 0.57 - 1.00  mg/dL 0.67 0.62 0.45  Sodium 134 - 144 mmol/L 139 140 139  Potassium 3.5 - 5.2 mmol/L 5.0 5.0 3.8  Chloride 96 - 106 mmol/L 104 101 108  CO2 20 - 29 mmol/L 19(L) 25 24  Calcium 8.7 - 10.2 mg/dL 9.4 9.5 8.8(L)  Total Protein 6.0 - 8.5 g/dL 7.2 7.2 -  Total Bilirubin 0.0 - 1.2 mg/dL 0.7 0.4 -  Alkaline Phos 39 - 117 IU/L 79 82 -  AST 0 - 40 IU/L 14 12 -  ALT 0 - 32 IU/L 13 9 -   Lipid Panel  Component Value Date/Time   CHOL 106 09/17/2019 0932   TRIG 137 09/17/2019 0932   HDL 40 09/17/2019 0932   CHOLHDL 2.7 09/17/2019 0932   LDLCALC 42 09/17/2019 0932    CBC    Component Value Date/Time   WBC 7.9 09/17/2019 0932   WBC 6.3 02/05/2017 0443   RBC 5.13 09/17/2019 0932   RBC 4.16 02/05/2017 0443   HGB 14.5 09/17/2019 0932   HCT 42.5 09/17/2019 0932   PLT 372 09/17/2019 0932   MCV 83 09/17/2019 0932   MCH 28.3 09/17/2019 0932   MCH 27.2 02/05/2017 0443   MCHC 34.1 09/17/2019 0932   MCHC 31.1 02/05/2017 0443   RDW 14.4 09/17/2019 0932   LYMPHSABS 1.8 02/05/2017 0443   MONOABS 0.4 02/05/2017 0443   EOSABS 0.1 02/05/2017 0443   BASOSABS 0.0 02/05/2017 0443    ASSESSMENT AND [name]: 1. Epistaxis -Pretty much resolved.  She will continue using the antibiotic ointment as needed.  2. Essential hypertension Close to goal.  Continue lisinopril  3. Diabetes mellitus type 2 in obese ([facility]) - Glucose (CBG)     Patient was given the opportunity to ask questions.  Patient verbalized understanding of the [name] and was able to repeat key elements of the [name].   Orders Placed This Encounter  Procedures  . Glucose (CBG)     Requested Prescriptions    No prescriptions requested or ordered in this encounter    Return in about 4 months (around 02/29/2020).  Karle Plumber, MD, FACP

## 2019-11-16 ENCOUNTER — Ambulatory Visit: Payer: Self-pay

## 2019-11-16 DIAGNOSIS — Z23 Encounter for immunization: Secondary | ICD-10-CM

## 2019-11-16 NOTE — Progress Notes (Signed)
   Covid-19 Vaccination Clinic  Name:  Aimee [name]. Aimee [name]    MRN: [id number] DOB: 04-22-1976  11/16/2019  [name] was observed post Covid-19 immunization for 15 minutes without incident. She was provided with Vaccine Information Sheet and instruction to access the V-Safe system.   [name] was instructed to call 911 with any severe reactions post vaccine: Marland Kitchen Difficulty breathing  . Swelling of face and throat  . A fast heartbeat  . A bad rash all over body  . Dizziness and weakness   Immunizations Administered    Name Date Dose VIS Date Route   Pfizer COVID-19 Vaccine 11/16/2019  2:53 PM 0.3 mL 07/17/2019 Intramuscular   Manufacturer: [place]   Lot: [id number]   [place]: [id number]

## 2019-11-19 ENCOUNTER — Other Ambulatory Visit: Payer: Self-pay

## 2019-11-19 ENCOUNTER — Ambulatory Visit: Payer: Self-pay

## 2020-03-03 ENCOUNTER — Encounter: Payer: Self-pay | Admitting: Neurology

## 2020-03-03 ENCOUNTER — Other Ambulatory Visit: Payer: Self-pay

## 2020-03-03 ENCOUNTER — Encounter: Payer: Self-pay | Admitting: Internal Medicine

## 2020-03-03 ENCOUNTER — Ambulatory Visit: Payer: Self-pay | Attending: Internal Medicine | Admitting: Internal Medicine

## 2020-03-03 VITALS — BP 106/71 | HR 56 | Wt 205.0 lb

## 2020-03-03 DIAGNOSIS — E1169 Type 2 diabetes mellitus with other specified complication: Secondary | ICD-10-CM

## 2020-03-03 DIAGNOSIS — I10 Essential (primary) hypertension: Secondary | ICD-10-CM

## 2020-03-03 DIAGNOSIS — E785 Hyperlipidemia, unspecified: Secondary | ICD-10-CM

## 2020-03-03 DIAGNOSIS — G4489 Other headache syndrome: Secondary | ICD-10-CM

## 2020-03-03 DIAGNOSIS — Z8679 Personal history of other diseases of the circulatory system: Secondary | ICD-10-CM

## 2020-03-03 DIAGNOSIS — E669 Obesity, unspecified: Secondary | ICD-10-CM

## 2020-03-03 MED ORDER — METFORMIN HCL 500 MG PO TABS: 500.0000 mg | ORAL_TABLET | Freq: Every day | ORAL | 1 refills | Status: DC

## 2020-03-03 MED ORDER — LISINOPRIL 5 MG PO TABS: 5.0000 mg | ORAL_TABLET | Freq: Every day | ORAL | 1 refills | Status: DC

## 2020-03-03 MED ORDER — ATORVASTATIN CALCIUM 10 MG PO TABS: 10.0000 mg | ORAL_TABLET | Freq: Every day | ORAL | 6 refills | Status: DC

## 2020-03-03 NOTE — Progress Notes (Signed)
Virtual Visit via Telephone Note Due to current restrictions/limitations of in-office visits due to the COVID-19 pandemic, this scheduled clinical appointment was converted to a telehealth visit  I connected with Aimee [name] on 03/03/20 at 9:34 a.m by telephone and verified that I am speaking with the correct person using two identifiers. I am in my office.  The patient is at home.  Only the patient and myself participated in this encounter.  I discussed the limitations, risks, security and privacy concerns of performing an evaluation and management service by telephone and the availability of in person appointments. I also discussed with the patient that there may be a patient responsible charge related to this service. The patient expressed understanding and agreed to proceed.   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  Last seen 10/2019  Since last visit she had a root canal on one of her teeth in upper jaw earlier this mth.  Doing good.  DIABETES TYPE 2 Last A1C:   Results for orders placed or performed in visit on 10/30/19  Glucose (CBG)  Result Value Ref Range   POC Glucose 104 (A) 70 - 99 mg/dl    Med Adherence:  _0  Yes    _1  No Medication side effects:  _2  Yes    _3  No Home Monitoring?  _4  Yes     Home glucose results range: morning range before BF 100-118 Diet Adherence: _5  Yes    _6  No Exercise: _7  Yes -walking 5 days a wk Hypoglycemic episodes?: _8  Yes    _9  No Numbness of the feet? _10  Yes    _11  No Retinopathy hx? _12  Yes    _13  No Last eye exam:  Up to date Comments:   HYPERTENSION Currently taking: see medication list Med Adherence: _14  Yes    _15  No Medication side effects: _16  Yes    _17  No Adherence with salt restriction: _18  Yes    _19  No Home Monitoring?: _20  Yes    _21  No Monitoring Frequency:  daily Home BP  results range: today 106/71.  Yesterday 113/73 SOB? _22  Yes    _23  No Chest Pain?: _24  Yes    _25  No Leg swelling?: _26  Yes    _27  No Headaches?: _28  Yes 1-3 times a wk if she gets stressed.  Can last all day if it is bad. But most of the times it last 3 hrs.  Same frequency since [facility] in 2018.  Endorses blurred vision with HA.  No photophobia, N/V.  Better if she stays busy.  "When its pretty pretty bad, I take Tylenol."  Takes Tylenol 500 mg 1 time.  Relief within 1 hr.   Dizziness? _29  Yes    _30  No Comments:   HL:  Taking and tolerating Lipitor.  HM:  Wondering if she needs MMG.  No fhx of breast CA   Outpatient Encounter Medications as of 03/03/2020  Medication Sig  . atorvastatin (LIPITOR) 10 MG tablet Take 1 tablet (10 mg total) by mouth daily.  . Blood Glucose Monitoring Suppl (TRUE METRIX METER) w/Device KIT Use as directed  . clotrimazole-betamethasone (LOTRISONE) cream Apply 1 application topically 2 (two) times daily. For 10 days then as needed  . glucose blood (TRUE METRIX BLOOD GLUCOSE TEST) test strip Use as instructed  . lisinopril (ZESTRIL) 5 MG tablet Take 1 tablet (5 mg total) by mouth daily.  . metFORMIN (  GLUCOPHAGE) 500 MG tablet Take 1 tablet (500 mg total) by mouth daily with breakfast.  . Tdap (BOOSTRIX) 5-2.5-18.5 LF-MCG/0.5 injection Inject 0.5 mLs into the muscle as directed. (Patient not taking: Reported on 09/18/2018)  . TRUEPLUS LANCETS 28G MISC Use as directed   No facility-administered encounter medications on file as of 03/03/2020.      Observations/Objective: Results for orders placed or performed in visit on 10/30/19  Glucose (CBG)  Result Value Ref Range   POC Glucose 104 (A) 70 - 99 mg/dl     Chemistry      Component Value Date/Time   NA 139 09/17/2019 0932   K 5.0 09/17/2019 0932   CL 104 09/17/2019 0932   CO2 19 (L) 09/17/2019 0932   BUN 13 09/17/2019 0932   CREATININE 0.67 09/17/2019 0932      Component Value Date/Time   CALCIUM 9.4  09/17/2019 0932   ALKPHOS 79 09/17/2019 0932   AST 14 09/17/2019 0932   ALT 13 09/17/2019 0932   BILITOT 0.7 09/17/2019 0932     Lab Results  Component Value Date   WBC 7.9 09/17/2019   HGB 14.5 09/17/2019   HCT 42.5 09/17/2019   MCV 83 09/17/2019   PLT 372 09/17/2019   Lab Results  Component Value Date   CHOL 106 09/17/2019   HDL 40 09/17/2019   LDLCALC 42 09/17/2019   TRIG 137 09/17/2019   CHOLHDL 2.7 09/17/2019     Assessment and Plan: 1. Diabetes mellitus type 2 in obese [place]) Reported blood sugars are good.  She will continue Metformin, healthy eating habits and regular exercise. - metFORMIN (GLUCOPHAGE) 500 MG tablet; Take 1 tablet (500 mg total) by mouth daily with breakfast.  Dispense: 90 tablet; Refill: 1  2. Essential hypertension Home blood pressure readings are at goal.  Continue lisinopril - lisinopril (ZESTRIL) 5 MG tablet; Take 1 tablet (5 mg total) by mouth daily.  Dispense: 90 tablet; Refill: 1  3. Headache syndrome Patient to continue Tylenol as needed.  Will refer to neurology for further evaluation - Ambulatory referral to Neurology  4. History of subarachnoid hemorrhage - Ambulatory referral to Neurology  5. Hyperlipidemia associated with type 2 diabetes mellitus (HCC) - atorvastatin (LIPITOR) 10 MG tablet; Take 1 tablet (10 mg total) by mouth daily.  Dispense: 30 tablet; Refill: 6   Follow Up Instructions: 4 mths   I discussed the assessment and treatment plan with the patient. The patient was provided an opportunity to ask questions and all were answered. The patient agreed with the plan and demonstrated an understanding of the instructions.   The patient was advised to call back or seek an in-person evaluation if the symptoms worsen or if the condition fails to improve as anticipated.  I provided 16 minutes of non-face-to-face time during this encounter.   Karle Plumber, MD

## 2020-03-03 NOTE — Progress Notes (Signed)
Pt states her blood sugar is 107

## 2020-05-09 ENCOUNTER — Telehealth: Payer: Self-pay | Admitting: *Deleted

## 2020-05-09 NOTE — Telephone Encounter (Signed)
Copied from [place] [id number]. Topic: Quick Communication - See Telephone Encounter >> May 09, 2020  8:20 AM Loma [place] wrote: CRM for notification. See Telephone encounter for: 05/09/20. Pt has an orange card and expires 05/20/20  Needs it renewed, [address] [place]

## 2020-05-23 ENCOUNTER — Other Ambulatory Visit: Payer: Self-pay

## 2020-05-23 ENCOUNTER — Ambulatory Visit: Payer: Self-pay | Attending: Internal Medicine

## 2020-05-25 NOTE — Progress Notes (Signed)
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  History supplemented by referring provider's note.    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  Underwent coiling.  At the time, she was placed on Keppra for seizure prophylaxis for short time.  She describes a severe pressure pain right left anterior parietal region.  They are associated with blurred vision but no photophobia, phonophobia, nausea, vomiting, dizziness, or speech disturbance.  They typically last 3 hours and occur 1 to 3 times a week.  They are triggered by stress and relieved if she rests in bed.  When severe, she takes Tylenol $RemoveBefor'500mg'[id number]$  and has relief within 1 hour.  She takes Tylenol infrequently.    Current NSAIDS/analgesics:  Tylenol $Remove'500mg'hjVqtvE$  Current triptans:  none Current ergotamine:  none Current anti-emetic:  none Current muscle relaxants:  none Current Antihypertensive medications:  Lisinopril  Current Antidepressant medications:  none Current Anticonvulsant medications:  none Current anti-CGRP:  none Current Vitamins/Herbal/Supplements:  none Current Antihistamines/Decongestants:  none Other therapy:  Cold compress on head and back of neck Hormone/birth control:  none Other medications:  Atorvastatin, metformin  Past NSAIDS/analgesics:  none Past abortive triptans:  none Past abortive ergotamine:  none Past muscle relaxants:  none Past anti-emetic:  none Past antihypertensive medications:  none Past antidepressant  medications:  none Past anticonvulsant medications:  Keppra Past anti-CGRP:  none Past vitamins/Herbal/Supplements:  none Past antihistamines/decongestants:  none Other past therapies:  none  Caffeine:  2 cups of coffee daily, sometimes Coke Zero Diet:  2 L of water daily. Does not skip meals. Exercise:  Tries to walk 30 minutes daily. Depression:  none; Anxiety:  some Other pain:  none Sleep hygiene:  Good. Family history of headache:  No.    Imaging personally reviewed: 12/25/2016 CT HEAD:  Diffuse subarachnoid hemorrhage, filling CSF spaces in the suprasellar and basal cisterns as well as bilateral sulci. Cause is not determined but presents high suspicion for ruptured intracranial aneurysm. 12/25/2016 CTA HEAD & NECK:  1. 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  Aneurysm is directed posteriorly, and demonstrates a fairly narrow neck. Neuro endovascular consultation is recommended.  2. Diffuse irregularity throughout the remaining intracranial arterial vasculature, suspicious for vasospasm secondary to acute subarachnoid hemorrhage.  Normal CTA of neck. 12/26/2016 MRI BRAIN WO: 1. 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 More confluent acute infarcts in the high frontoparietal regions bilaterally.  2. Subarachnoid and small volume intraventricular hemorrhage.  12/30/2016 MRI BRAIN WO:  1. Overall similar appearance of scattered bilateral embolic infarcts and confluent cortical infarcts at the vertex bilaterally.  2. No new infarcts.  3. Decreasing subarachnoid and intraventricular hemorrhage. 01/30/2017 CT HEAD WO:  1. 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  No new/acute intracranial findings. 2. 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  3. Persistent/chronic left maxillary sinus disease. 4. Hyperostosis frontalis interna  PAST MEDICAL HISTORY: Past Medical  History:  Diagnosis Date  .  Diabetes mellitus without complication ([place])   . Hypertension   . Stroke [facility])     PAST SURGICAL HISTORY: Past Surgical History:  Procedure Laterality Date  . IR ANGIO INTRA EXTRACRAN SEL INTERNAL CAROTID BILAT MOD SED  12/25/2016  . IR ANGIO VERTEBRAL SEL VERTEBRAL UNI L MOD SED  12/25/2016  . IR ANGIOGRAM FOLLOW UP STUDY  12/25/2016  . IR ANGIOGRAM FOLLOW UP STUDY  12/25/2016  . IR ANGIOGRAM FOLLOW UP STUDY  12/25/2016  . IR ANGIOGRAM FOLLOW UP STUDY  12/25/2016  . IR TRANSCATH/EMBOLIZ  12/25/2016  . RADIOLOGY WITH ANESTHESIA N/A 12/25/2016   Procedure: RADIOLOGY WITH ANESTHESIA;  Surgeon: Consuella Lose, MD;  Location: [place];  Service: Radiology;  Laterality: N/A;    MEDICATIONS: Current Outpatient Medications on File Prior to Visit  Medication Sig Dispense Refill  . atorvastatin (LIPITOR) 10 MG tablet Take 1 tablet (10 mg total) by mouth daily. 30 tablet 6  . Blood Glucose Monitoring Suppl (TRUE METRIX METER) w/Device KIT Use as directed 1 kit 0  . clotrimazole-betamethasone (LOTRISONE) cream Apply 1 application topically 2 (two) times daily. For 10 days then as needed 30 g 3  . glucose blood (TRUE METRIX BLOOD GLUCOSE TEST) test strip Use as instructed 100 each 12  . lisinopril (ZESTRIL) 5 MG tablet Take 1 tablet (5 mg total) by mouth daily. 90 tablet 1  . metFORMIN (GLUCOPHAGE) 500 MG tablet Take 1 tablet (500 mg total) by mouth daily with breakfast. 90 tablet 1  . Tdap (BOOSTRIX) 5-2.5-18.5 LF-MCG/0.5 injection Inject 0.5 mLs into the muscle as directed. (Patient not taking: Reported on 09/18/2018) 0.5 mL 0  . TRUEPLUS LANCETS 28G MISC Use as directed 100 each 4   No current facility-administered medications on file prior to visit.    ALLERGIES: No Known Allergies  FAMILY HISTORY: Family History  Problem Relation Age of Onset  . Stroke Mother   . Hypertension Mother   . Diabetes Father    SOCIAL HISTORY: Social History   Socioeconomic  History  . Marital status: Single    Spouse name: Not on file  . Number of children: 0  . Years of education: Not on file  . Highest education level: Bachelor's degree (e.g., BA, AB, BS)  Occupational History  . Not on file  Tobacco Use  . Smoking status: Never Smoker  . Smokeless tobacco: Never Used  Vaping Use  . Vaping Use: Never used  Substance and Sexual Activity  . Alcohol use: No  . Drug use: No  . Sexual activity: Not Currently  Other Topics Concern  . Not on file  Social History Narrative  . Not on file   Social Determinants of Health   Financial Resource Strain:   . Difficulty of Paying Living Expenses: Not on file  Food Insecurity:   . Worried About Charity fundraiser in the Last Year: Not on file  . Ran Out of Food in the Last Year: Not on file  Transportation Needs:   . Lack of Transportation (Medical): Not on file  . Lack of Transportation (Non-Medical): Not on file  Physical Activity:   . Days of Exercise per Week: Not on file  . Minutes of Exercise per Session: Not on file  Stress:   . Feeling of Stress : Not on file  Social Connections:   . Frequency of Communication with Friends and Family: Not on file  . Frequency of Social Gatherings with Friends and Family: Not on file  .  Attends Religious Services: Not on file  . Active Member of Clubs or Organizations: Not on file  . Attends Archivist Meetings: Not on file  . Marital Status: Not on file  Intimate Partner Violence:   . Fear of Current or Ex-Partner: Not on file  . Emotionally Abused: Not on file  . Physically Abused: Not on file  . Sexually Abused: Not on file   PHYSICAL EXAM: Blood pressure 124/85, pulse 91, height 5' 4" (1.626 m), weight 211 lb (95.7 kg), SpO2 99 %. General: No acute distress.  Patient appears well-groomed.   Head:  Normocephalic/atraumatic Eyes:  fundi examined but not visualized Neck: supple, no paraspinal tenderness, full range of motion Back: No  paraspinal tenderness Heart: regular rate and rhythm Lungs: Clear to auscultation bilaterally. Vascular: No carotid bruits. Neurological Exam: Mental status: alert and oriented to person, place, and time, recent and remote memory intact, fund of knowledge intact, attention and concentration intact, speech fluent and not dysarthric, language intact. Cranial nerves: CN I: not tested CN II: pupils equal, round and reactive to light, visual fields intact CN III, IV, VI:  full range of motion, no nystagmus, no ptosis CN V: facial sensation intact CN VII: upper and lower face symmetric CN VIII: hearing intact CN IX, X: gag intact, uvula midline CN XI: sternocleidomastoid and trapezius muscles intact CN XII: tongue midline Bulk & Tone: normal, no fasciculations. Motor:  5/5 throughout  Sensation:  temperature and vibration sensation intact. Deep Tendon Reflexes:  2+ throughout, toes downgoing.   Finger to nose testing:  Without dysmetria.   Heel to shin:  Without dysmetria.   Gait:  Normal station and stride.  Romberg negative.  IMPRESSION: 1.  Right sided posttraumatic headache 2.  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  PLAN: 1.  Check CTA of head to evaluate for recurrence of aneurysm 2.  Start nortriptyline 37m at bedtime.  We can increase dose to 223mat bedtime in 6 weeks if needed. 3.  May use Tylenol as needed.  But Limit use of pain relievers to no more than 2 days out of week to prevent risk of rebound or medication-overuse headache. 4.  Follow up 6 months.  Thank you for allowing me to take part in the care of this patient.  AdMetta ClinesDO  CC: DeKarle PlumberMD

## 2020-05-26 ENCOUNTER — Encounter: Payer: Self-pay | Admitting: Neurology

## 2020-05-26 ENCOUNTER — Ambulatory Visit (INDEPENDENT_AMBULATORY_CARE_PROVIDER_SITE_OTHER): Payer: Self-pay | Admitting: Neurology

## 2020-05-26 ENCOUNTER — Other Ambulatory Visit: Payer: Self-pay

## 2020-05-26 VITALS — BP 124/85 | HR 91 | Ht 64.0 in | Wt 211.0 lb

## 2020-05-26 DIAGNOSIS — G44329 Chronic post-traumatic headache, not intractable: Secondary | ICD-10-CM

## 2020-05-26 DIAGNOSIS — I608 Other nontraumatic subarachnoid hemorrhage: Secondary | ICD-10-CM

## 2020-05-26 MED ORDER — NORTRIPTYLINE HCL 10 MG PO CAPS: 10.0000 mg | ORAL_CAPSULE | Freq: Every day | ORAL | 5 refills | Status: DC

## 2020-05-26 NOTE — Patient Instructions (Addendum)
1.  Start nortriptyline 10mg  at bedtime.  If headaches not improved in 6 weeks, contact me and we can increase dose. 2.  Keep headache diary 3.  May use Tylenol if needed.  Limit use of pain relievers to no more than 2 days out of week to prevent risk of rebound or medication-overuse headache. 4.  Check CTA of head. We have sent a referral to [facility] for your MRI and they will call you directly to schedule your appointment. They are located at [place]. If you need to contact them directly please call [phone].  5.  Follow up in 6 months.

## 2020-05-27 ENCOUNTER — Other Ambulatory Visit: Payer: Self-pay | Admitting: Internal Medicine

## 2020-05-27 DIAGNOSIS — E669 Obesity, unspecified: Secondary | ICD-10-CM

## 2020-05-27 DIAGNOSIS — E1169 Type 2 diabetes mellitus with other specified complication: Secondary | ICD-10-CM

## 2020-05-27 NOTE — Telephone Encounter (Signed)
Requested Prescriptions  Pending Prescriptions Disp Refills   glucose blood test strip [Pharmacy Med Name: TRUE METRIX GLUCOSE TES] 100 each 0    Sig: USE AS DIRECTED     Endocrinology: Diabetes - Testing Supplies Passed - 05/27/2020  2:27 PM      Passed - Valid encounter within last 12 months    Recent Outpatient Visits          2 months ago Diabetes mellitus type 2 in obese [facility])   [place] Ladell Pier, MD   7 months ago Epistaxis   [place] Karle Plumber B, MD   8 months ago Diabetes mellitus type 2 in obese [facility])   [place] Ladell Pier, MD   1 year ago Diabetes mellitus type 2 in obese [facility])   [place] [facility] Ladell Pier, MD   1 year ago Type 2 diabetes mellitus without complication, without long-term current use of insulin [facility])   [place] [facility] Ladell Pier, MD

## 2020-06-13 ENCOUNTER — Ambulatory Visit
Admission: RE | Admit: 2020-06-13 | Discharge: 2020-06-13 | Disposition: A | Discharge status: Home / Self Care | Payer: Self-pay | Source: Ambulatory Visit | Attending: Neurology | Admitting: Neurology

## 2020-06-13 DIAGNOSIS — I608 Other nontraumatic subarachnoid hemorrhage: Secondary | ICD-10-CM

## 2020-06-13 DIAGNOSIS — G44329 Chronic post-traumatic headache, not intractable: Secondary | ICD-10-CM

## 2020-06-13 MED ORDER — IOPAMIDOL (ISOVUE-370) INJECTION 76%
75.0000 mL | Freq: Once | INTRAVENOUS | Status: AC | PRN
  Administered 2020-06-13: 75 mL via INTRAVENOUS

## 2020-06-22 ENCOUNTER — Other Ambulatory Visit: Payer: Self-pay | Admitting: Pharmacist

## 2020-06-22 DIAGNOSIS — E669 Obesity, unspecified: Secondary | ICD-10-CM

## 2020-06-22 DIAGNOSIS — E1169 Type 2 diabetes mellitus with other specified complication: Secondary | ICD-10-CM

## 2020-06-22 MED ORDER — TRUE METRIX BLOOD GLUCOSE TEST VI STRP: ORAL_STRIP | 0 refills | Status: AC

## 2020-08-30 ENCOUNTER — Other Ambulatory Visit: Payer: Self-pay | Admitting: Internal Medicine

## 2020-08-30 DIAGNOSIS — E669 Obesity, unspecified: Secondary | ICD-10-CM

## 2020-08-30 DIAGNOSIS — I10 Essential (primary) hypertension: Secondary | ICD-10-CM

## 2020-08-30 DIAGNOSIS — E1169 Type 2 diabetes mellitus with other specified complication: Secondary | ICD-10-CM

## 2020-08-30 DIAGNOSIS — E785 Hyperlipidemia, unspecified: Secondary | ICD-10-CM

## 2020-08-30 MED ORDER — LISINOPRIL 5 MG PO TABS: 5.0000 mg | ORAL_TABLET | Freq: Every day | ORAL | 1 refills | Status: DC

## 2020-08-30 MED ORDER — METFORMIN HCL 500 MG PO TABS: 500.0000 mg | ORAL_TABLET | Freq: Every day | ORAL | 1 refills | Status: DC

## 2020-08-30 MED ORDER — ATORVASTATIN CALCIUM 10 MG PO TABS: 10.0000 mg | ORAL_TABLET | Freq: Every day | ORAL | 6 refills | Status: DC

## 2020-08-30 NOTE — Telephone Encounter (Signed)
Medication Refill - Medication: Metformin, lisinopril,atorvastatin   Has the patient contacted their pharmacy? Yes.   Pt set up an appt on 10/31/20. She states that she does not have enough medication to last until then. Please advise. (Agent: If no, request that the patient contact the pharmacy for the refill.) (Agent: If yes, when and what did the pharmacy advise?)  Preferred Pharmacy (with phone number or street name):  [facility], [address]  [address] [place] [address]  Phone: [phone] Fax: [phone]  Hours: Not open 24 hours     Agent: Please be advised that RX refills may take up to 3 business days. We ask that you follow-up with your pharmacy.

## 2020-10-31 ENCOUNTER — Ambulatory Visit: Payer: Self-pay | Attending: Internal Medicine | Admitting: Internal Medicine

## 2020-10-31 DIAGNOSIS — K219 Gastro-esophageal reflux disease without esophagitis: Secondary | ICD-10-CM

## 2020-10-31 DIAGNOSIS — Z1211 Encounter for screening for malignant neoplasm of colon: Secondary | ICD-10-CM

## 2020-10-31 DIAGNOSIS — E785 Hyperlipidemia, unspecified: Secondary | ICD-10-CM

## 2020-10-31 DIAGNOSIS — N911 Secondary amenorrhea: Secondary | ICD-10-CM

## 2020-10-31 DIAGNOSIS — E1159 Type 2 diabetes mellitus with other circulatory complications: Secondary | ICD-10-CM

## 2020-10-31 DIAGNOSIS — I10 Essential (primary) hypertension: Secondary | ICD-10-CM

## 2020-10-31 DIAGNOSIS — Z9229 Personal history of other drug therapy: Secondary | ICD-10-CM

## 2020-10-31 DIAGNOSIS — E1169 Type 2 diabetes mellitus with other specified complication: Secondary | ICD-10-CM

## 2020-10-31 NOTE — Progress Notes (Signed)
Virtual Visit via Telephone Note  I connected with Aimee [name] on 10/31/20 at 3:20 p.m by telephone and verified that I am speaking with the correct person using two identifiers.  Location: Patient: home Provider: office Participants: Myself Patient     I discussed the limitations, risks, security and privacy concerns of performing an evaluation and management service by telephone and the availability of in person appointments. I also discussed with the patient that there may be a patient responsible charge related to this service. The patient expressed understanding and agreed to proceed.   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  Last evaluated 02/2020.  Pt is english speaking.  DIABETES TYPE 2 Last A1C:   Results for orders placed or performed in visit on 10/30/19  Glucose (CBG)  Result Value Ref Range   POC Glucose 104 (A) 70 - 99 mg/dl    Lab Results  Component Value Date   HGBA1C 5.9 (H) 09/17/2019    Med Adherence:  _0  Yes -on Metformin Medication side effects:  _1  Yes    _2  No Home Monitoring?  _3  Yes  Home glucose results range: before BF 120-150,  Diet Adherence: _4  Yes  -monitoring carb and sugar intake Exercise: _5  Yes  -30 mins of cardio exercise daily Hypoglycemic episodes?: _6  Yes    _7  No Numbness of the feet? _8  Yes    _9  No Retinopathy hx? _10  Yes    _11  No Last eye exam:  No blurred vision.  Due for eye exam. C/O watery eye LT eye x 1 wk.  No itching.   Comments:   HYPERTENSION Currently taking: see medication list Med Adherence: _12  Yes    _13  No Medication side effects: _14  Yes    _15  No Adherence with salt restriction: _16  Yes    _17  No Home Monitoring?: _18  Yes    _19  No Monitoring Frequency:  A few times a wk Home BP results range: 117/80 this a.m SOB? _20  Yes    _21  No Chest Pain?: _22  Yes     _23  No Leg swelling?: _24  Yes    _25  No Headaches?: _26  Yes    _27  No Dizziness? _28  Yes    _29  No Comments:   HL:  Taking and tolerating Atorvastatin.  Occasional muscle cramps  C/o amenorrhea for past 2 mths.  Started having some hot flashes.  Not sexually active over past 2-3 yrs so not concern for pregnancy  Seen UC in 06/2021 with abdominal pain.  Dx with GERD and prescribed with Omeprazole.  Worked well.  She uses it now only as needed but does not have to use it very often.  Her brother also had acid reflux and was tested for H. pylori.  She wonders whether she also needs to be tested.  HM:  Due for colon CA screening.  No family history of colon cancer.  COVID-19 booster in Dec 2022.  She does not have her card handy to give me the date. Outpatient Encounter Medications as of 10/31/2020  Medication Sig  . atorvastatin (LIPITOR) 10 MG tablet Take 1 tablet (10 mg total) by mouth daily.  . Blood Glucose Monitoring Suppl (TRUE METRIX METER) w/Device KIT Use as directed  . clotrimazole-betamethasone (LOTRISONE) cream Apply 1 application topically 2 (two) times daily. For 10 days then as needed  . glucose blood (TRUE METRIX BLOOD GLUCOSE TEST) test  strip Use to check blood sugar once daily. E11.69  . lisinopril (ZESTRIL) 5 MG tablet Take 1 tablet (5 mg total) by mouth daily.  . metFORMIN (GLUCOPHAGE) 500 MG tablet Take 1 tablet (500 mg total) by mouth daily with breakfast.  . nortriptyline (PAMELOR) 10 MG capsule Take 1 capsule (10 mg total) by mouth at bedtime.  . Tdap (BOOSTRIX) 5-2.5-18.5 LF-MCG/0.5 injection Inject 0.5 mLs into the muscle as directed. (Patient not taking: Reported on 09/18/2018)  . TRUEPLUS LANCETS 28G MISC Use as directed   No facility-administered encounter medications on file as of 10/31/2020.      Observations/Objective: No direct observation done as this was a telephone encounter.  Assessment and Plan: 1. Type 2 diabetes mellitus with other circulatory  complication, without long-term current use of insulin ([place]) Reported blood sugars are good.  Continue Metformin.  Advised to get eye exam as soon as she is able to afford. - CBC; Future - Comprehensive metabolic panel; Future - Hemoglobin A1c; Future - Microalbumin / creatinine urine ratio; Future  2. Essential hypertension At goal.  Continue lisinopril and low-salt diet  3. Hyperlipidemia associated with type 2 diabetes mellitus (HCC) Continue atorvastatin - Lipid panel; Future  4. Amenorrhea, secondary Likely due to being perimenopausal.  Discussed perimenopausal symptoms with her. - Follicle stimulating hormone; Future - Luteinizing hormone; Future - TSH; Future - Prolactin; Future  5. Gastroesophageal reflux disease without esophagitis Continue omeprazole as needed.  6. Screening for colon cancer Discussed colon cancer screening methods.  Patient prefers to have colonoscopy. - Ambulatory referral to Gastroenterology  7. COVID-19 vaccine series completed Patient to bring her vaccine card with her on next visit for [place] to update this in the system.   Follow Up Instructions: 4 months.   I discussed the assessment and treatment plan with the patient. The patient was provided an opportunity to ask questions and all were answered. The patient agreed with the plan and demonstrated an understanding of the instructions.   The patient was advised to call back or seek an in-person evaluation if the symptoms worsen or if the condition fails to improve as anticipated.  I spent 16 minutes on this telephone encounter.   Karle Plumber, MD

## 2020-11-02 ENCOUNTER — Ambulatory Visit: Payer: Self-pay | Attending: Internal Medicine

## 2020-11-02 ENCOUNTER — Other Ambulatory Visit: Payer: Self-pay

## 2020-11-02 DIAGNOSIS — N911 Secondary amenorrhea: Secondary | ICD-10-CM

## 2020-11-02 DIAGNOSIS — E1159 Type 2 diabetes mellitus with other circulatory complications: Secondary | ICD-10-CM

## 2020-11-02 DIAGNOSIS — E1169 Type 2 diabetes mellitus with other specified complication: Secondary | ICD-10-CM

## 2020-11-02 DIAGNOSIS — E785 Hyperlipidemia, unspecified: Secondary | ICD-10-CM

## 2020-11-03 LAB — CBC
Hematocrit: 43.9 % (ref 34.0–46.6)
Hemoglobin: 14.3 g/dL (ref 11.1–15.9)
MCH: 27.3 pg (ref 26.6–33.0)
MCHC: 32.6 g/dL (ref 31.5–35.7)
MCV: 84 fL (ref 79–97)
Platelets: 356 10*3/uL (ref 150–450)
RBC: 5.24 x10E6/uL (ref 3.77–5.28)
RDW: 14.6 % (ref 11.7–15.4)
WBC: 7.3 10*3/uL (ref 3.4–10.8)

## 2020-11-03 LAB — PROLACTIN: Prolactin: 11.7 ng/mL (ref 4.8–23.3)

## 2020-11-03 LAB — COMPREHENSIVE METABOLIC PANEL WITH GFR
ALT: 18 IU/L (ref 0–32)
AST: 21 IU/L (ref 0–40)
Albumin/Globulin Ratio: 1.5 (ref 1.2–2.2)
Albumin: 4.4 g/dL (ref 3.8–4.8)
Alkaline Phosphatase: 101 IU/L (ref 44–121)
BUN/Creatinine Ratio: 24 — ABNORMAL HIGH (ref 9–23)
BUN: 17 mg/dL (ref 6–24)
Bilirubin Total: 0.5 mg/dL (ref 0.0–1.2)
CO2: 21 mmol/L (ref 20–29)
Calcium: 9.5 mg/dL (ref 8.7–10.2)
Chloride: 103 mmol/L (ref 96–106)
Creatinine, Ser: 0.72 mg/dL (ref 0.57–1.00)
Globulin, Total: 2.9 g/dL (ref 1.5–4.5)
Glucose: 106 mg/dL — ABNORMAL HIGH (ref 65–99)
Potassium: 4.7 mmol/L (ref 3.5–5.2)
Sodium: 138 mmol/L (ref 134–144)
Total Protein: 7.3 g/dL (ref 6.0–8.5)
eGFR: 105 mL/min/{1.73_m2} (ref >59)

## 2020-11-03 LAB — LIPID PANEL
Chol/HDL Ratio: 3 ratio (ref 0.0–4.4)
Cholesterol, Total: 121 mg/dL (ref 100–199)
HDL: 40 mg/dL (ref >39)
LDL Chol Calc (NIH): 53 mg/dL (ref 0–99)
Triglycerides: 166 mg/dL — ABNORMAL HIGH (ref 0–149)
VLDL Cholesterol Cal: 28 mg/dL (ref 5–40)

## 2020-11-03 LAB — MICROALBUMIN / CREATININE URINE RATIO
Creatinine, Urine: 170.4 mg/dL
Microalb/Creat Ratio: 4 mg/g{creat} (ref 0–29)
Microalbumin, Urine: 7.1 ug/mL

## 2020-11-03 LAB — TSH: TSH: 1.82 u[IU]/mL (ref 0.450–4.500)

## 2020-11-03 LAB — LUTEINIZING HORMONE: LH: 24.2 m[IU]/mL

## 2020-11-03 LAB — HEMOGLOBIN A1C
Est. average glucose Bld gHb Est-mCnc: 146 mg/dL
Hgb A1c MFr Bld: 6.7 % — ABNORMAL HIGH (ref 4.8–5.6)

## 2020-11-03 LAB — FOLLICLE STIMULATING HORMONE: FSH: 30.7 m[IU]/mL

## 2020-11-03 NOTE — Progress Notes (Signed)
Let patient know that her hormone levels are in the early perimenopausal range.  A1c is 6.7 with goal being less than 7.  This means her diabetes is controlled.  Cholesterol levels are good.  Kidney and liver function tests are normal.  Blood cell counts including red blood cell, white blood cell and platelet counts are normal.

## 2020-11-04 ENCOUNTER — Telehealth: Payer: Self-pay

## 2020-11-04 NOTE — Telephone Encounter (Signed)
[place] interpreters  brenda Id# [phone] contacted pt to go over lab results pt didn't answer and was unable to lvm

## 2020-11-22 ENCOUNTER — Other Ambulatory Visit: Payer: Self-pay

## 2020-11-22 ENCOUNTER — Ambulatory Visit: Payer: Self-pay | Attending: Internal Medicine

## 2020-11-23 NOTE — Progress Notes (Signed)
NEUROLOGY FOLLOW UP OFFICE NOTE  Aimee [name] [id number]  Assessment/Plan:   1. Right sided posttraumatic headache - improved but will try to optimize management 2.  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  Increase nortriptyline to 54m at bedtime 2.  Limit use of pain relievers to no more than 2 days out of week to prevent risk of rebound or medication-overuse headache. 3.  Headache diary 4.  Follow up 6 months.   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  UPDATE: Started nortriptyline in October. Intensity:  moderate Duration:  Couple of hours Frequency:  On average 4 times [name] month (no headaches in March)  CTA of head on 06/13/2020 demonstrated treak artifact from the coiling that limited visualization of the paraclinoid right ICA and proximal PCAs but no new aneurysms elsewhere or recurrence of treated aneurysm.  Frequency of abortive medication: 4 days [name] month Current NSAIDS/analgesics:  Tylenol 5069mCurrent triptans:  none Current ergotamine:  none Current anti-emetic:  none Current muscle relaxants:  none Current Antihypertensive medications:  Lisinopril  Current Antidepressant medications:  nortriptyline 1037mHS Current Anticonvulsant medications:  none Current anti-CGRP:  none Current Vitamins/Herbal/Supplements:  none Current Antihistamines/Decongestants:  none Other therapy:  Cold compress on head and back of neck Hormone/birth control:  none Other medications:  Atorvastatin, metformin  Caffeine:  2 cups of coffee daily, sometimes Coke Zero Diet:  2 L of water daily. Does not skip meals. Exercise:  Tries to walk 30 minutes daily. Depression:  none; Anxiety:  some Other pain:  none Sleep hygiene:  Good.  HISTORY: She has had  head[facility] for many years but [facility] and bilateral embolic infarct[facility] in May [id number] coiling.  At the time, she was placed on Keppra for seizure prophylaxis for short time.  She describes [name] severe pressure pain right left anterior parietal region.  They are associated with blurred vision but no photophobia, phonophobia, nausea, vomiting, dizziness, or speech disturbance.  They typically last 3 hours and occur 1 to 3 times [name] week.  They are triggered by stress and relieved if she rests in bed.  When severe, she takes Tylenol 500m59md has relief within 1 hour.  She takes Tylenol infrequently.    Past NSAIDS/analgesics:  none Past abortive triptans:  none Past abortive ergotamine:  none Past muscle relaxants:  none Past anti-emetic:  none Past antihypertensive medications:  none Past antidepressant medications:  none Past anticonvulsant medications:  Keppra Past anti-CGRP:  none Past vitamins/Herbal/Supplements:  none Past antihistamines/decongestants:  none Other past therapies:  none   Family history of headache:  No.    Imaging personally reviewed: 12/25/2016 CT HEAD:  Diffuse subarachnoid hemorrhage, filling CSF spaces in the suprasellar and basal cisterns as well as bilateral sulci. Cause is not determined but presents high suspicion for ruptured intracranial aneurysm. 12/25/2016 CTA HEAD & NECK:  1. 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  Aneurysm is directed posteriorly, and demonstrates [name] fairly narrow neck. Neuro endovascular consultation is recommended.  2. Diffuse irregularity throughout the remaining intracranial arterial vasculature, suspicious for vasospasm secondary to acute subarachnoid hemorrhage.  Normal CTA of neck. 12/26/2016 MRI BRAIN WO: 1. Scattered acute embolic infarcts in  both cerebral hemispheres and left cerebellum. More confluent acute infarcts in the high frontoparietal regions  bilaterally.  2. Subarachnoid and small volume intraventricular hemorrhage.  12/30/2016 MRI BRAIN WO:  1. Overall similar appearance of scattered bilateral embolic infarcts and confluent cortical infarcts at the vertex bilaterally.  2. No new infarcts.  3. Decreasing subarachnoid and intraventricular hemorrhage. 01/30/2017 CT HEAD WO:  1. 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  No new/acute intracranial findings. 2. 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  3. Persistent/chronic left maxillary sinus disease. 4. Hyperostosis frontalis interna 06/13/2020 CTA HEAD:  1. Prominent streak artifact arising from an embolization coil mass at site of [name] treated right posterior communicating artery aneurysm. This limits evaluation of the paraclinoid right ICA and proximal posterior cerebral arteries. This also significantly limits evaluation for recurrent aneurysm filling. 2. No evidence of recurrent filling of the treated aneurysm. 3. No intracranial large vessel occlusion or proximal high-grade arterial stenosis. 4. No new intracranial aneurysm is identified elsewhere.  PAST MEDICAL HISTORY: Past Medical History:  Diagnosis Date  . Diabetes mellitus without complication ([place])   . Hypertension   . Stroke [facility])    Dec 25 2016    MEDICATIONS: Current Outpatient Medications on File Prior to Visit  Medication Sig Dispense Refill  . atorvastatin (LIPITOR) 10 MG tablet Take 1 tablet (10 mg total) by mouth daily. 30 tablet 6  . Blood Glucose Monitoring Suppl (TRUE METRIX METER) w/Device KIT Use as directed 1 kit 0  . clotrimazole-betamethasone (LOTRISONE) cream Apply 1 application topically 2 (two) times daily. For 10 days then as needed 30 g 3  . glucose blood (TRUE METRIX BLOOD GLUCOSE TEST) test strip Use to check blood sugar once daily. E11.69 100 each 0  . lisinopril (ZESTRIL) 5 MG tablet Take 1 tablet (5 mg total) by mouth daily. 90  tablet 1  . metFORMIN (GLUCOPHAGE) 500 MG tablet Take 1 tablet (500 mg total) by mouth daily with breakfast. 90 tablet 1  . nortriptyline (PAMELOR) 10 MG capsule Take 1 capsule (10 mg total) by mouth at bedtime. 30 capsule 5  . Tdap (BOOSTRIX) 5-2.5-18.5 LF-MCG/0.5 injection Inject 0.5 mLs into the muscle as directed. (Patient not taking: Reported on 09/18/2018) 0.5 mL 0  . TRUEPLUS LANCETS 28G MISC Use as directed 100 each 4   No current facility-administered medications on file prior to visit.    ALLERGIES: No Known Allergies  FAMILY HISTORY: Family History  Problem Relation Age of Onset  . Stroke Mother   . Hypertension Mother   . Diabetes Father       Objective:  Blood pressure 122/86, pulse 89, height '5\' 4"'  (1.626 m), weight 213 lb (96.6 kg), SpO2 99 %. General: No acute distress.  Patient appears well-groomed.    Metta Clines, DO  CC: Karle Plumber, MD

## 2020-11-24 ENCOUNTER — Other Ambulatory Visit: Payer: Self-pay

## 2020-11-24 ENCOUNTER — Encounter: Payer: Self-pay | Admitting: Neurology

## 2020-11-24 ENCOUNTER — Ambulatory Visit (INDEPENDENT_AMBULATORY_CARE_PROVIDER_SITE_OTHER): Payer: Self-pay | Admitting: Neurology

## 2020-11-24 VITALS — BP 122/86 | HR 89 | Ht 64.0 in | Wt 213.0 lb

## 2020-11-24 DIAGNOSIS — G44329 Chronic post-traumatic headache, not intractable: Secondary | ICD-10-CM

## 2020-11-24 DIAGNOSIS — I608 Other nontraumatic subarachnoid hemorrhage: Secondary | ICD-10-CM

## 2020-11-24 MED ORDER — NORTRIPTYLINE HCL 25 MG PO CAPS: 25.0000 mg | ORAL_CAPSULE | Freq: Every day | ORAL | 5 refills | Status: DC

## 2020-11-24 NOTE — Patient Instructions (Signed)
1.  Increase nortriptyline to 25mg  at bedtime 2.  Limit use of pain relievers to no more than 2 days out of week to prevent risk of rebound or medication-overuse headache. 3.  Keep headache diary 4.  Follow up 6 months.

## 2020-12-16 IMAGING — CT CT ANGIO HEAD
3 of 10 series · 15 of 47 positions shown · IV contrast (iopamidol)
Comparison: Head CT 01/30/2017.

CLINICAL DATA: Chronic posttraumatic headache, not intractable.
Subarachnoid hemorrhage due to ruptured aneurysm. Dizziness,
nonspecific. Additional history provided by scanning technologist:
patient reports headaches for 1 year, history of subarachnoid
hemorrhage 3 years ago, history of brain aneurysm.

Creatinine was obtained on site at [HOSPITAL] at [REDACTED].
Results: Creatinine 0.7 mg/dL (GFR 109).
EXAM:
CT ANGIOGRAPHY HEAD
TECHNIQUE: Multidetector CT imaging of the head was performed using the
standard protocol during bolus administration of intravenous
contrast. Multiplanar CT image reconstructions and MIPs were
obtained to evaluate the vascular anatomy.
CONTRAST:  75mL 53OCX0-AXU IOPAMIDOL (53OCX0-AXU) INJECTION 76%

[Series 16: cta brain 1.00 hv48 ax thin mips · axial · 0.48mm/px · z∈[-606,-461]mm · 9 of 179 slices shown]
[im 17/179  brain]
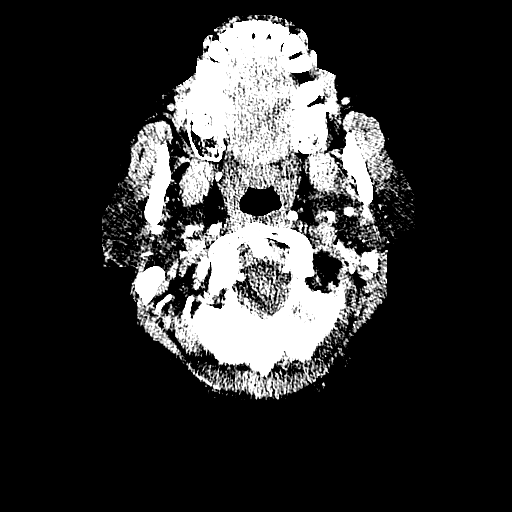
[im 33/179  bone]
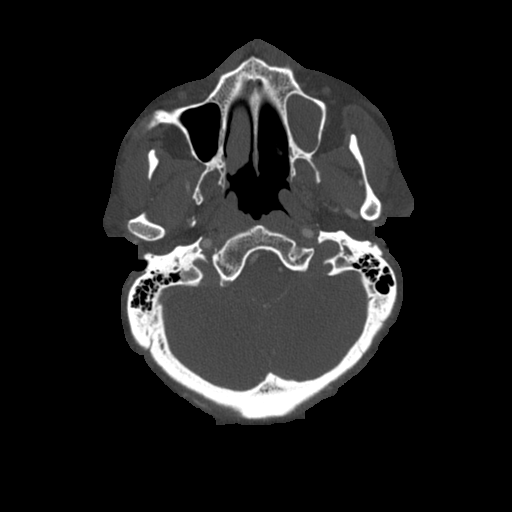
[im 49/179  brain]
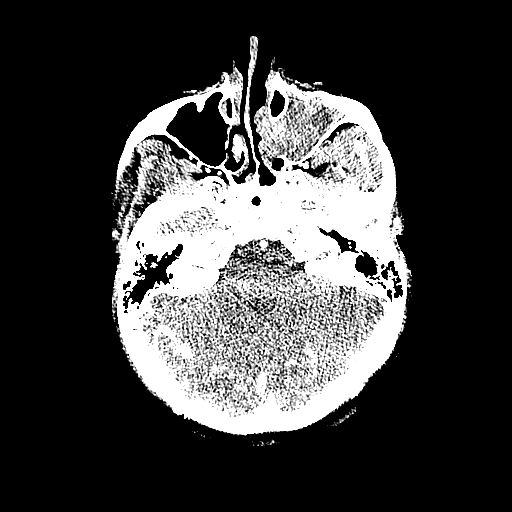
[im 65/179  bone]
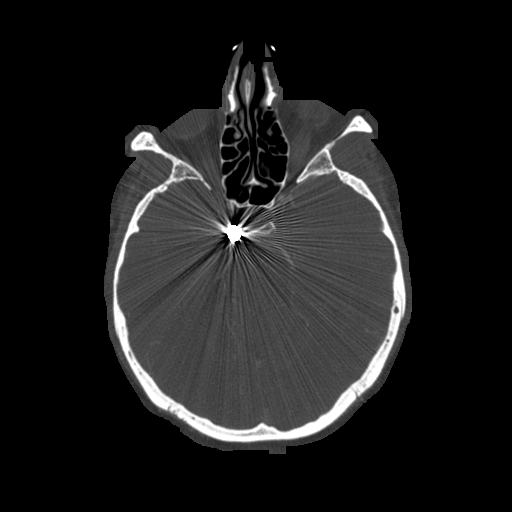
[im 98/179  brain]
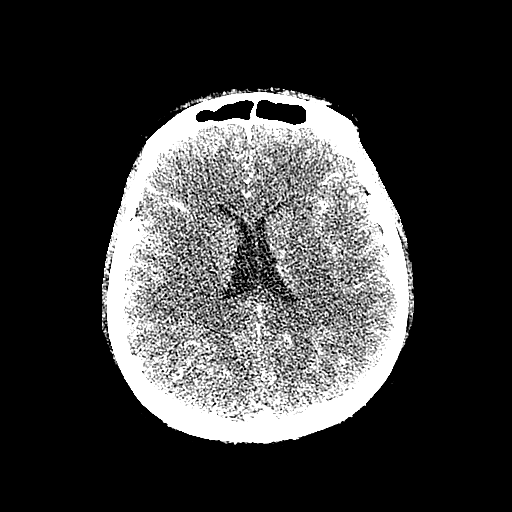
[im 114/179  bone]
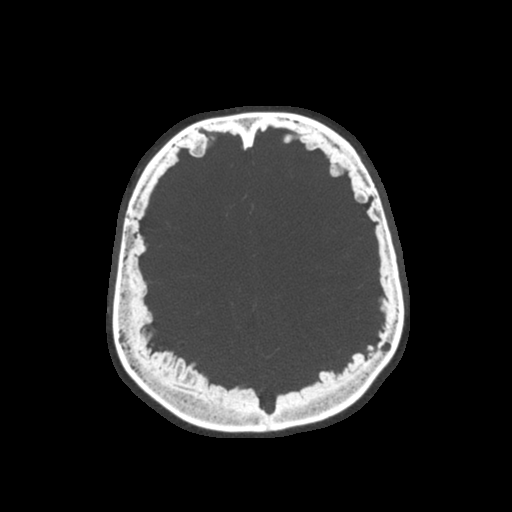
[im 130/179  brain]
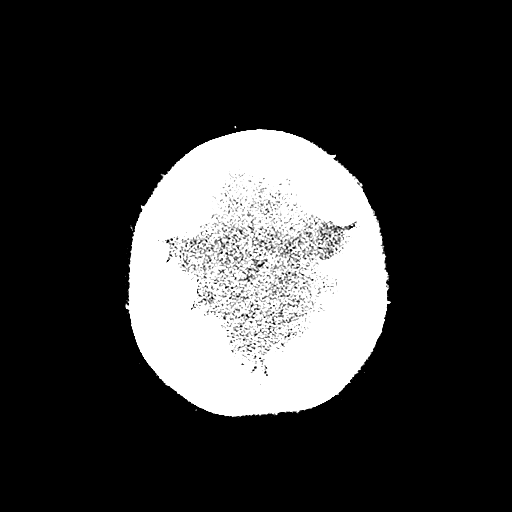
[im 146/179  bone]
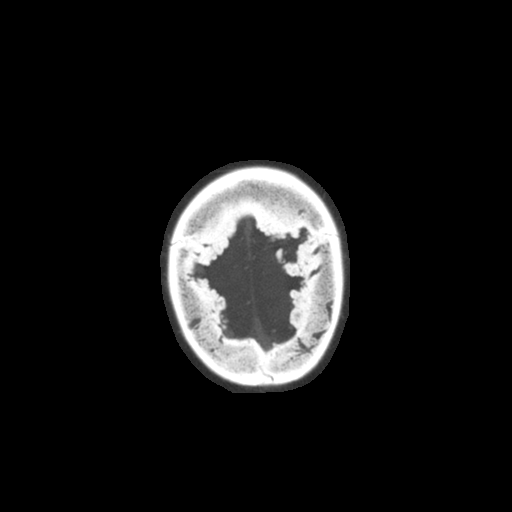
[im 162/179  brain]
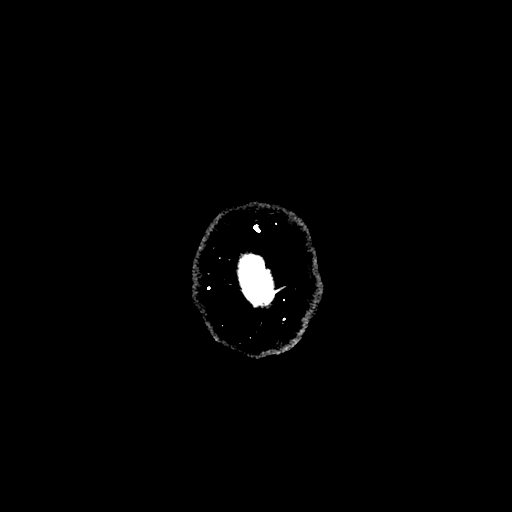

[Series 17: cta brain 1.00 hv48 cor thin mips · coronal · 0.35mm/px · 3 of 244 slices shown]
[im 70/244  brain]
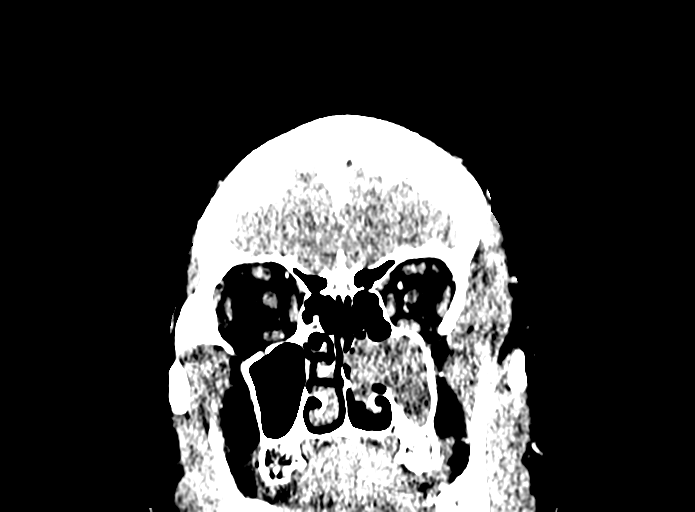
[im 105/244  brain]
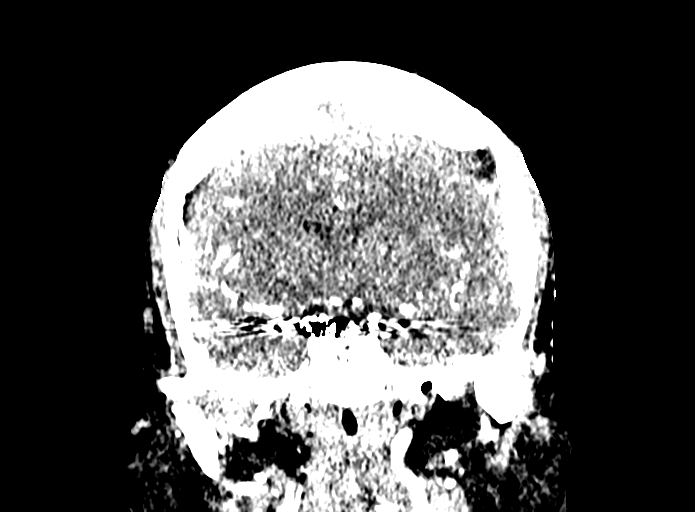
[im 139/244  brain]
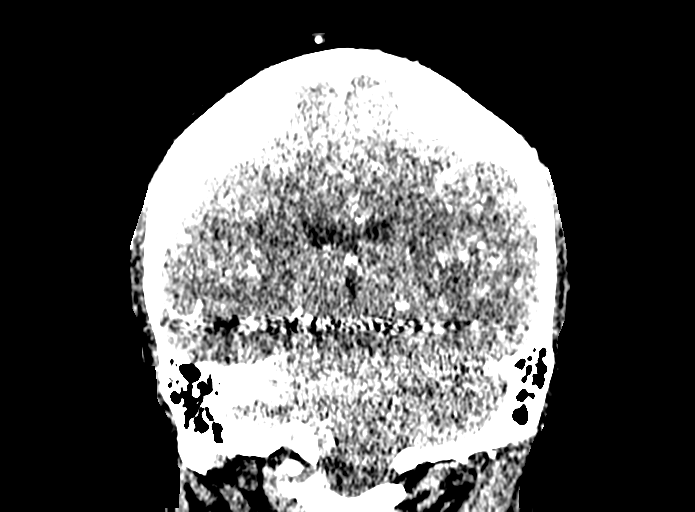

[Series 19: cta brain 1.00 hv48 sag thin mips · sagittal · 0.35mm/px · 3 of 244 slices shown]
[im 49/244  brain]
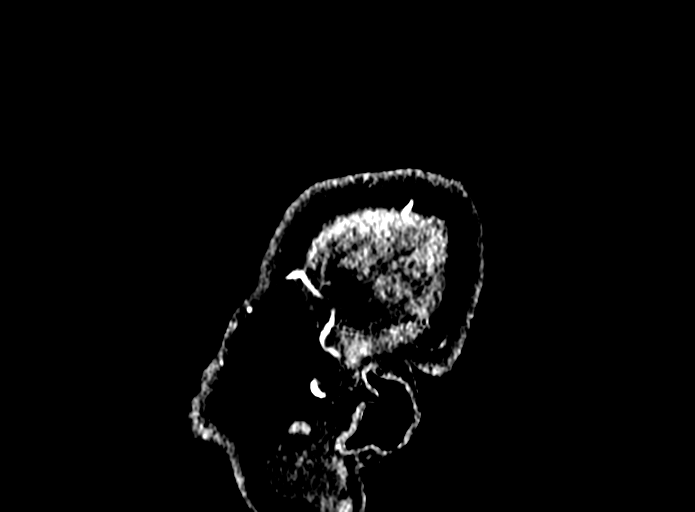
[im 98/244  brain]
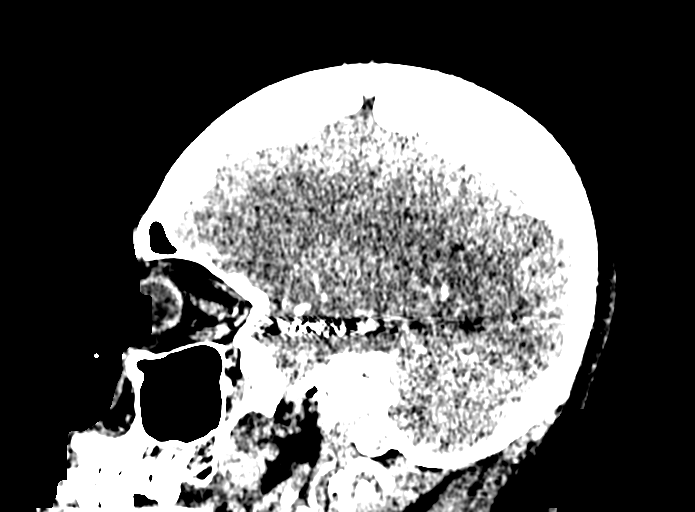
[im 146/244  brain]
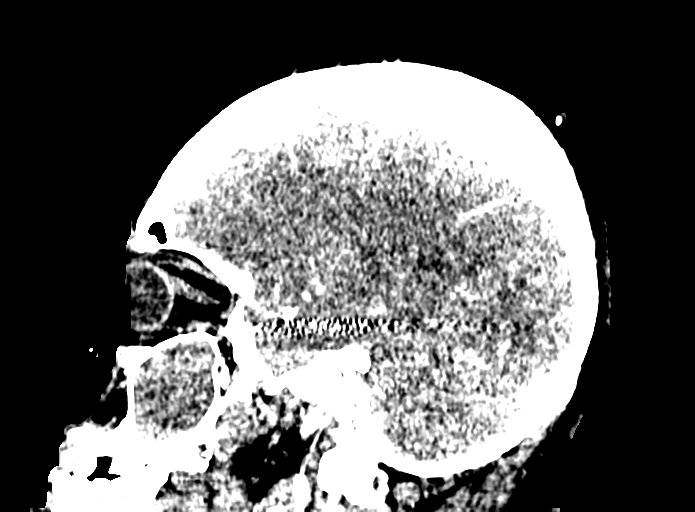

[15 of 47 positions shown; findings below may reference images not displayed]

Brain MRI 12/30/2016. Report from
catheter based angiography 12/25/2016. CT angiogram head/neck
12/25/2016.
FINDINGS: CT HEAD

Brain:

Streak artifact arising from an embolization coil mass at site of
previously treated right posterior communicating artery aneurysm.

Cerebral volume is normal.

Redemonstrated chronic cortically based infarcts within the high
frontal lobes. Multiple additional known small chronic
supratentorial and infratentorial infarcts were better appreciated
on the prior brain MRI of 12/30/2016 (acute at that time).

There is no acute intracranial hemorrhage.

No acute demarcated cortical infarct is identified.

No extra-axial fluid collection.

No evidence of intracranial mass.

No midline shift.

Vascular: Reported below.  Embolization coil mass as described.

Skull: Normal. Negative for fracture or focal lesion. Hyperostosis
frontalis interna

Sinuses: Chronic complete opacification of the left maxillary sinus.
Opacification of the adjacent left middle meatus.

Orbits: No mass or acute finding.

CTA HEAD

Anterior circulation:

Streak artifact arising from an embolization coil mass in the region
of a treated right posterior communicating artery aneurysm. This
limits evaluation of the paraclinoid right internal carotid artery.
Within this limitation, the intracranial internal carotid arteries
are patent without significant stenosis and there is no appreciable
recurrent filling of the treated aneurysm. The M1 middle cerebral
arteries are patent without significant stenosis. No M2 proximal
branch occlusion or high-grade proximal stenosis is identified. The
anterior cerebral arteries are patent. No new intracranial aneurysm
is identified elsewhere.

Posterior circulation:

The intracranial vertebral arteries are patent. The basilar artery
is patent. The posterior cerebral arteries are patent. Streak
artifact arising from the embolization coil mass limits evaluation
of the proximal posterior cerebral arteries. Within this limitation,
the posterior cerebral arteries are patent without significant
proximal stenosis.

Venous sinuses: Within limitations of contrast timing, no convincing
thrombus.

Anatomic variants: None significant
IMPRESSION: CT head:

1. No evidence of acute intracranial abnormality.
2. Redemonstrated chronic cortically based infarcts within the high
frontal lobes. Multiple additional known small chronic
supratentorial and infratentorial infarcts were better appreciated
on the prior brain MRI of 12/30/2016 (acute at that time).
3. Chronic left maxillary sinusitis with complete sinus
opacification. Opacification of the adjacent left middle meatus.

CTA head:

1. Prominent streak artifact arising from an embolization coil mass
at site of a treated right posterior communicating artery aneurysm.
This limits evaluation of the paraclinoid right ICA and proximal
posterior cerebral arteries. This also significantly limits
evaluation for recurrent aneurysm filling.
2. No evidence of recurrent filling of the treated aneurysm.
3. No intracranial large vessel occlusion or proximal high-grade
arterial stenosis.
4. No new intracranial aneurysm is identified elsewhere.

## 2021-03-03 ENCOUNTER — Ambulatory Visit: Payer: Self-pay | Attending: Internal Medicine | Admitting: Internal Medicine

## 2021-03-03 ENCOUNTER — Encounter: Payer: Self-pay | Admitting: Internal Medicine

## 2021-03-03 ENCOUNTER — Other Ambulatory Visit: Payer: Self-pay

## 2021-03-03 VITALS — BP 125/86 | HR 100 | Resp 16 | Wt 213.6 lb

## 2021-03-03 DIAGNOSIS — E1169 Type 2 diabetes mellitus with other specified complication: Secondary | ICD-10-CM

## 2021-03-03 DIAGNOSIS — Z6838 Body mass index (BMI) 38.0-38.9, adult: Secondary | ICD-10-CM

## 2021-03-03 DIAGNOSIS — E1159 Type 2 diabetes mellitus with other circulatory complications: Secondary | ICD-10-CM

## 2021-03-03 DIAGNOSIS — E785 Hyperlipidemia, unspecified: Secondary | ICD-10-CM

## 2021-03-03 DIAGNOSIS — Z1211 Encounter for screening for malignant neoplasm of colon: Secondary | ICD-10-CM

## 2021-03-03 DIAGNOSIS — Z8679 Personal history of other diseases of the circulatory system: Secondary | ICD-10-CM

## 2021-03-03 DIAGNOSIS — E66812 Morbid (severe) obesity due to excess calories: Secondary | ICD-10-CM

## 2021-03-03 DIAGNOSIS — I10 Essential (primary) hypertension: Secondary | ICD-10-CM

## 2021-03-03 LAB — POCT GLYCOSYLATED HEMOGLOBIN (HGB A1C): HbA1c, POC (controlled diabetic range): 6.2 % (ref 0.0–7.0)

## 2021-03-03 LAB — GLUCOSE, POCT (MANUAL RESULT ENTRY): POC Glucose: 110 mg/dL — AB (ref 70–99)

## 2021-03-03 MED ORDER — ATORVASTATIN CALCIUM 10 MG PO TABS
10.0000 mg | ORAL_TABLET | Freq: Every day | ORAL | 6 refills | Status: DC
Start: 2021-03-03 — End: 2021-07-04

## 2021-03-03 MED ORDER — METFORMIN HCL 500 MG PO TABS: 500.0000 mg | ORAL_TABLET | Freq: Every day | ORAL | 1 refills | Status: DC

## 2021-03-03 MED ORDER — LISINOPRIL 10 MG PO TABS: 10.0000 mg | ORAL_TABLET | Freq: Every day | ORAL | 1 refills | Status: DC

## 2021-03-03 NOTE — Progress Notes (Signed)
Patient ID: Aimee [name], female    DOB: [birth year]  MRN: [id number]  CC: Diabetes   Subjective: Aimee [name] is a 45 y.o. female who presents for chronic ds management Her concerns today include:  pt with history of diabetes type 2, HTN, HL, SAH (12/25/2016 with PCA aneurysm status post coiling),  hx DVT LLE and RLE, completed course of Xarelto  DIABETES TYPE 2 Last A1C:   Results for orders placed or performed in visit on 03/03/21  POCT glucose (manual entry)  Result Value Ref Range   POC Glucose 110 (A) 70 - 99 mg/dl  POCT glycosylated hemoglobin (Hb A1C)  Result Value Ref Range   Hemoglobin A1C     HbA1c POC (<> result, manual entry)     HbA1c, POC (prediabetic range)     HbA1c, POC (controlled diabetic range) 6.2 0.0 - 7.0 %    Med Adherence:  '[x]'  Yes.  She is on metformin Medication side effects:  '[]'  Yes    '[x]'  No Home Monitoring?  '[x]'  Yes  Home glucose results range:100-110 in a.m; post meal 95-110 Diet Adherence: '[x]'  Yes  Exercise: '[x]'  Yes - walks 30 mins QOD Hypoglycemic episodes?: '[]'  Yes    '[x]'  No Numbness of the feet? '[]'  Yes    '[x]'  No Retinopathy hx? '[]'  Yes    '[]'  No Last eye exam: had eye exam 01/20/2021 at [facility].  No retinopathy Comments:   HYPERTENSION Currently taking: see medication list.  She is on lisinopril. Med Adherence: '[x]'  Yes    '[]'  No Medication side effects: '[]'  Yes    '[x]'  No Adherence with salt restriction: '[x]'  Yes    '[]'  No Home Monitoring?: '[x]'  Yes    '[]'  No Monitoring Frequency: daily Home BP results range: 120s/70-83 SOB? '[]'  Yes    '[x]'  No Chest Pain?: '[]'  Yes    '[x]'  No Leg swelling?: '[]'  Yes    '[x]'  No Headaches?: '[]'  Yes    '[x]'  No - not lately.  Saw Dr. Tomi Likens 11/2020 for RT side posttraumatic HA.  He increased Nortriptyline to 25 mg. Doing good on it.  She thinks she is due for follow-up with Dr. Kathyrn Sheriff Dizziness? '[]'  Yes    '[x]'  No Comments:   HL:  taking and tolerating Lipitor  GERD: takes Omeprazole PRN.  Has  to use about 1-2x/mth  HM:  due for colon cancer screening Patient Active Problem List   Diagnosis Date Noted   Gastroesophageal reflux disease without esophagitis 10/31/2020   Fibroadenoma of right breast 12/16/2018   Insomnia 12/16/2018   Rotator cuff syndrome of right shoulder 06/26/2017   Type 2 diabetes mellitus without complication, without long-term current use of insulin ([place]) 04/11/2017   Reactive hypertension    Class 1 obesity due to excess calories with serious comorbidity and body mass index (BMI) of 30.0 to 30.9 in adult    Essential hypertension    Diabetes mellitus type 2 in obese (HCC)    Subarachnoid hemorrhage due to ruptured aneurysm ([place]) 12/25/2016     Current Outpatient Medications on File Prior to Visit  Medication Sig Dispense Refill   aspirin 81 MG chewable tablet Chew by mouth daily.     atorvastatin (LIPITOR) 10 MG tablet Take 1 tablet (10 mg total) by mouth daily. 30 tablet 6   Blood Glucose Monitoring Suppl (TRUE METRIX METER) w/Device KIT Use as directed 1 kit 0   clotrimazole-betamethasone (LOTRISONE) cream Apply 1 application topically 2 (two) times  daily. For 10 days then as needed 30 g 3   glucose blood (TRUE METRIX BLOOD GLUCOSE TEST) test strip Use to check blood sugar once daily. E11.69 100 each 0   lisinopril (ZESTRIL) 5 MG tablet Take 1 tablet (5 mg total) by mouth daily. 90 tablet 1   metFORMIN (GLUCOPHAGE) 500 MG tablet Take 1 tablet (500 mg total) by mouth daily with breakfast. 90 tablet 1   nortriptyline (PAMELOR) 25 MG capsule Take 1 capsule (25 mg total) by mouth at bedtime. 30 capsule 5   omeprazole (PRILOSEC) 20 MG capsule Take 20 mg by mouth daily.     TRUEPLUS LANCETS 28G MISC Use as directed 100 each 4   No current facility-administered medications on file prior to visit.    No Known Allergies  Social History   Socioeconomic History   Marital status: Single    Spouse name: Not on file   Number of children: 0   Years of  education: Not on file   Highest education level: Bachelor's degree (e.g., BA, AB, BS)  Occupational History   Not on file  Tobacco Use   Smoking status: Never   Smokeless tobacco: Never  Vaping Use   Vaping Use: Never used  Substance and Sexual Activity   Alcohol use: No   Drug use: No   Sexual activity: Not Currently  Other Topics Concern   Not on file  Social History Narrative   Right Handed   Lives in a one story   Drinks caffeine   Social Determinants of Health   Financial Resource Strain: Not on file  Food Insecurity: Not on file  Transportation Needs: Not on file  Physical Activity: Not on file  Stress: Not on file  Social Connections: Not on file  Intimate Partner Violence: Not on file    Family History  Problem Relation Age of Onset   Stroke Mother    Hypertension Mother    Diabetes Father     Past Surgical History:  Procedure Laterality Date   IR ANGIO INTRA EXTRACRAN SEL INTERNAL CAROTID BILAT MOD SED  12/25/2016   IR ANGIO VERTEBRAL SEL VERTEBRAL UNI L MOD SED  12/25/2016   IR ANGIOGRAM FOLLOW UP STUDY  12/25/2016   IR ANGIOGRAM FOLLOW UP STUDY  12/25/2016   IR ANGIOGRAM FOLLOW UP STUDY  12/25/2016   IR ANGIOGRAM FOLLOW UP STUDY  12/25/2016   IR TRANSCATH/EMBOLIZ  12/25/2016   RADIOLOGY WITH ANESTHESIA N/A 12/25/2016   Procedure: RADIOLOGY WITH ANESTHESIA;  Surgeon: Consuella Lose, MD;  Location: [facility];  Service: Radiology;  Laterality: N/A;    ROS: Review of Systems Negative except as stated above  PHYSICAL EXAM: BP 125/86   Pulse 100   Resp 16   Wt 213 lb 9.6 oz (96.9 kg)   SpO2 97%   BMI 36.66 kg/m   Wt Readings from Last 3 Encounters:  03/03/21 213 lb 9.6 oz (96.9 kg)  11/24/20 213 lb (96.6 kg)  05/26/20 211 lb (95.7 kg)    Physical Exam Repeat BP 130/90 General appearance - alert, well appearing, and in no distress Mental status - normal mood, behavior, speech, dress, motor activity, and thought processes Chest - clear to  auscultation, no wheezes, rales or rhonchi, symmetric air entry Heart - normal rate, regular rhythm, normal S1, S2, no murmurs, rubs, clicks or gallops Extremities - peripheral pulses normal, no pedal edema, no clubbing or cyanosis   CMP Latest Ref Rng & Units 11/02/2020 09/17/2019 05/08/2018  Glucose  65 - 99 mg/dL 106(H) 93 82  BUN 6 - 24 mg/dL '17 13 15  ' Creatinine 0.57 - 1.00 mg/dL 0.72 0.67 0.62  Sodium 134 - 144 mmol/L 138 139 140  Potassium 3.5 - 5.2 mmol/L 4.7 5.0 5.0  Chloride 96 - 106 mmol/L 103 104 101  CO2 20 - 29 mmol/L 21 19(L) 25  Calcium 8.7 - 10.2 mg/dL 9.5 9.4 9.5  Total Protein 6.0 - 8.5 g/dL 7.3 7.2 7.2  Total Bilirubin 0.0 - 1.2 mg/dL 0.5 0.7 0.4  Alkaline Phos 44 - 121 IU/L 101 79 82  AST 0 - 40 IU/L '21 14 12  ' ALT 0 - 32 IU/L '18 13 9   ' Lipid Panel     Component Value Date/Time   CHOL 121 11/02/2020 0910   TRIG 166 (H) 11/02/2020 0910   HDL 40 11/02/2020 0910   CHOLHDL 3.0 11/02/2020 0910   LDLCALC 53 11/02/2020 0910    CBC    Component Value Date/Time   WBC 7.3 11/02/2020 0910   WBC 6.3 02/05/2017 0443   RBC 5.24 11/02/2020 0910   RBC 4.16 02/05/2017 0443   HGB 14.3 11/02/2020 0910   HCT 43.9 11/02/2020 0910   PLT 356 11/02/2020 0910   MCV 84 11/02/2020 0910   MCH 27.3 11/02/2020 0910   MCH 27.2 02/05/2017 0443   MCHC 32.6 11/02/2020 0910   MCHC 31.1 02/05/2017 0443   RDW 14.6 11/02/2020 0910   LYMPHSABS 1.8 02/05/2017 0443   MONOABS 0.4 02/05/2017 0443   EOSABS 0.1 02/05/2017 0443   BASOSABS 0.0 02/05/2017 0443    ASSESSMENT AND [name]:  1. Type 2 diabetes mellitus with other circulatory complication, without long-term current use of insulin (HCC) Controlled.  Continue metformin.  Continue healthy eating habits and regular exercise. - POCT glucose (manual entry) - POCT glycosylated hemoglobin (Hb A1C) - metFORMIN (GLUCOPHAGE) 500 MG tablet; Take 1 tablet (500 mg total) by mouth daily with breakfast.  Dispense: 90 tablet; Refill: 1  2.  Essential hypertension Not at goal.  Increase lisinopril to 10 mg daily. - lisinopril (ZESTRIL) 10 MG tablet; Take 1 tablet (10 mg total) by mouth daily.  Dispense: 90 tablet; Refill: 1  3. Hyperlipidemia associated with type 2 diabetes mellitus (HCC) Continue atorvastatin.  Last LDL at goal. - atorvastatin (LIPITOR) 10 MG tablet; Take 1 tablet (10 mg total) by mouth daily.  Dispense: 30 tablet; Refill: 6  4. Class 2 severe obesity with serious comorbidity and body mass index (BMI) of 38.0 to 38.9 in adult, unspecified obesity type ([place]) See #1 above. Dietary counseling given outlined below: Limit sugary drinks.  Avoid sodas, sweet tea, sport or energy drinks, or fruit drinks.  Drink water, lo-fat milk, or diet drinks. Limit snack foods.   Cut back on candy, cake, cookies, chips, ice cream.  These are a special treat, only in small amounts. Eat plenty of vegetables.  Especially dark green, red, and orange vegetables. Aim for at least 3 servings a day. More is better! Include fruit in your daily diet.  Whole fruit is much healthier than fruit juice! Limit "white" bread, "white" pasta, "white" rice.   Choose "100% whole grain" products, brown or wild rice. Avoid fatty meats. Try "Meatless Monday" and choose eggs or beans one day a week.  When eating meat, choose lean meats like chicken, [place], and fish.  Grill, broil, or bake meats instead of frying, and eat poultry without the skin. Eat less salt.  Avoid frozen pizzas, frozen  dinners and salty foods.  Use seasonings other than salt in cooking.  This can help blood pressure and keep you from swelling Beer, wine and liquor have calories.  If you can safely drink alcohol, limit to 1 drink per day for women, 2 drinks for men   5. Screening for colon cancer Discussed colon cancer screening methods.  She is agreeable to doing the fit test. - Fecal occult blood, imunochemical([facility])  6. History of subarachnoid hemorrhage I will submit  referral for her to have follow-up with Dr. Kathyrn Sheriff.  She tells me she was told by him that she needs to see him at least once a year. - Ambulatory referral to Interventional Radiology  Patient was given the opportunity to ask questions.  Patient verbalized understanding of the [name] and was able to repeat key elements of the [name].   Orders Placed This Encounter  Procedures   POCT glucose (manual entry)   POCT glycosylated hemoglobin (Hb A1C)     Requested Prescriptions    No prescriptions requested or ordered in this encounter    No follow-ups on file.  Karle Plumber, MD, FACP

## 2021-03-03 NOTE — Patient Instructions (Signed)
Your blood pressure is not at goal.  We have increased the lisinopril to 10 mg daily.

## 2021-03-13 LAB — FECAL OCCULT BLOOD, IMMUNOCHEMICAL: Fecal Occult Bld: NEGATIVE

## 2021-03-14 ENCOUNTER — Telehealth: Payer: Self-pay

## 2021-03-14 NOTE — Telephone Encounter (Signed)
Contacted pt to go over fit test results pt is aware and doesn't have any questions or concerns

## 2021-05-25 NOTE — Progress Notes (Signed)
NEUROLOGY FOLLOW UP OFFICE NOTE  Aimee [name] [id number]  Assessment/Plan:   1. Right sided posttraumatic headache - improved but will try to optimize management 2.  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  Increase nortriptyline to 3m at bedtime 2.  Limit use of pain relievers to no more than 2 days out of week to prevent risk of rebound or medication-overuse headache. 3.  Headache diary 4.  Follow up 6 months.  Subjective:  Ma. 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   UPDATE: Increased nortriptyline in April. Intensity:  moderate Duration:  Couple of hours Frequency:  0 to 2 headaches - no headaches since July) Frequency of abortive medication: 4 days a month Current NSAIDS/analgesics:  Tylenol 506mCurrent triptans:  none Current ergotamine:  none Current anti-emetic:  none Current muscle relaxants:  none Current Antihypertensive medications:  Lisinopril  Current Antidepressant medications:  nortriptyline 2577mHS Current Anticonvulsant medications:  none Current anti-CGRP:  none Current Vitamins/Herbal/Supplements:  none Current Antihistamines/Decongestants:  none Other therapy:  Cold compress on head and back of neck Hormone/birth control:  none Other medications:  Atorvastatin, metformin   Caffeine:  2 cups of coffee daily, sometimes Coke Zero Diet:  2 L of water daily. Does not skip meals. Exercise:  Tries to walk 30 minutes daily. Depression:  none; Anxiety:  some Other pain:  none Sleep hygiene:  Good.   HISTORY:  She has had headaches for many years but [facility] and bilateral embolic infarcts in May [id number] coiling.  At the time, she was placed on Keppra for seizure prophylaxis for short  time.  She describes a severe pressure pain right left anterior parietal region.  They are associated with blurred vision but no photophobia, phonophobia, nausea, vomiting, dizziness, or speech disturbance.  They typically last 3 hours and occur 1 to 3 times a week.  They are triggered by stress and relieved if she rests in bed.  When severe, she takes Tylenol 500m29md has relief within 1 hour.  She takes Tylenol infrequently.     Past NSAIDS/analgesics:  none Past abortive triptans:  none Past abortive ergotamine:  none Past muscle relaxants:  none Past anti-emetic:  none Past antihypertensive medications:  none Past antidepressant medications:  none Past anticonvulsant medications:  Keppra Past anti-CGRP:  none Past vitamins/Herbal/Supplements:  none Past antihistamines/decongestants:  none Other past therapies:  none     Family history of headache:  No.     Imaging personally reviewed: 12/25/2016 CT HEAD:  Diffuse subarachnoid hemorrhage, filling CSF spaces in the suprasellar and basal cisterns as well as bilateral sulci. Cause is not determined but presents high suspicion for ruptured intracranial aneurysm. 12/25/2016 CTA HEAD & NECK:  1. 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  Aneurysm is directed posteriorly, and demonstrates a fairly narrow neck. Neuro endovascular consultation is recommended.  2. Diffuse irregularity throughout the remaining intracranial arterial vasculature, suspicious for vasospasm secondary to acute subarachnoid hemorrhage.  Normal CTA of neck. 12/26/2016 MRI BRAIN WO: 1. 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 More confluent acute infarcts in the high frontoparietal regions bilaterally.  2. Subarachnoid and small volume intraventricular hemorrhage.  12/30/2016 MRI  BRAIN WO:  1. Overall similar appearance of scattered bilateral embolic infarcts and confluent cortical infarcts at the vertex bilaterally.   2. No new infarcts.  3. Decreasing subarachnoid and intraventricular hemorrhage. 01/30/2017 CT HEAD WO:  1. 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  No new/acute intracranial findings. 2. 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  3. Persistent/chronic left maxillary sinus disease. 4. Hyperostosis frontalis interna 06/13/2020 CTA HEAD:  1. Prominent streak artifact arising from an embolization coil mass at site of a treated right posterior communicating artery aneurysm. This limits evaluation of the paraclinoid right ICA and proximal posterior cerebral arteries. This also significantly limits evaluation for recurrent aneurysm filling. 2. No evidence of recurrent filling of the treated aneurysm. 3. No intracranial large vessel occlusion or proximal high-grade arterial stenosis. 4. No new intracranial aneurysm is identified elsewhere.   PAST MEDICAL HISTORY: Past Medical History:  Diagnosis Date   Diabetes mellitus without complication ([place])    Hypertension    Stroke [facility])    Dec 25 2016    MEDICATIONS: Current Outpatient Medications on File Prior to Visit  Medication Sig Dispense Refill   aspirin 81 MG chewable tablet Chew by mouth daily.     atorvastatin (LIPITOR) 10 MG tablet Take 1 tablet (10 mg total) by mouth daily. 30 tablet 6   Blood Glucose Monitoring Suppl (TRUE METRIX METER) w/Device KIT Use as directed 1 kit 0   clotrimazole-betamethasone (LOTRISONE) cream Apply 1 application topically 2 (two) times daily. For 10 days then as needed 30 g 3   glucose blood (TRUE METRIX BLOOD GLUCOSE TEST) test strip Use to check blood sugar once daily. E11.69 100 each 0   lisinopril (ZESTRIL) 10 MG tablet Take 1 tablet (10 mg total) by mouth daily. 90 tablet 1   metFORMIN (GLUCOPHAGE) 500 MG tablet Take 1 tablet (500 mg total) by mouth daily with breakfast. 90 tablet 1   nortriptyline (PAMELOR) 25 MG capsule Take 1 capsule (25 mg  total) by mouth at bedtime. 30 capsule 5   omeprazole (PRILOSEC) 20 MG capsule Take 20 mg by mouth daily.     TRUEPLUS LANCETS 28G MISC Use as directed 100 each 4   No current facility-administered medications on file prior to visit.    ALLERGIES: No Known Allergies  FAMILY HISTORY: Family History  Problem Relation Age of Onset   Stroke Mother    Hypertension Mother    Diabetes Father       Objective:  Blood pressure (!) 138/91, pulse 85, height _0  (1.626 m), weight 216 lb 3.2 oz (98.1 kg). General: No acute distress.  Patient appears well-groomed.   Head:  Normocephalic/atraumatic Eyes:  Fundi examined but not visualized Neck: supple, no paraspinal tenderness, full range of motion Heart:  Regular rate and rhythm Lungs:  Clear to auscultation bilaterally Back: No paraspinal tenderness Neurological Exam: alert and oriented to person, place, and time.  Speech fluent and not dysarthric, language intact.  CN II-XII intact. Bulk and tone normal, muscle strength 5/5 throughout.  Sensation to light touch intact.  Deep tendon reflexes 2+ throughout, toes downgoing.  Finger to nose testing intact.  Gait normal, Romberg negative.   Metta Clines, DO  CC: Karle Plumber, MD

## 2021-05-29 ENCOUNTER — Encounter: Payer: Self-pay | Admitting: Neurology

## 2021-05-29 ENCOUNTER — Ambulatory Visit (INDEPENDENT_AMBULATORY_CARE_PROVIDER_SITE_OTHER): Payer: Self-pay | Admitting: Neurology

## 2021-05-29 ENCOUNTER — Other Ambulatory Visit: Payer: Self-pay

## 2021-05-29 VITALS — BP 138/91 | HR 85 | Ht 64.0 in | Wt 216.2 lb

## 2021-05-29 DIAGNOSIS — G44309 Post-traumatic headache, unspecified, not intractable: Secondary | ICD-10-CM

## 2021-05-29 MED ORDER — NORTRIPTYLINE HCL 25 MG PO CAPS: 25.0000 mg | ORAL_CAPSULE | Freq: Every day | ORAL | 8 refills | Status: DC

## 2021-05-29 NOTE — Patient Instructions (Signed)
Continue nortriptyline Follow up 9 months

## 2021-07-04 ENCOUNTER — Encounter: Payer: Self-pay | Admitting: Internal Medicine

## 2021-07-04 ENCOUNTER — Ambulatory Visit: Payer: Self-pay | Attending: Internal Medicine | Admitting: Internal Medicine

## 2021-07-04 ENCOUNTER — Other Ambulatory Visit: Payer: Self-pay

## 2021-07-04 VITALS — BP 115/83 | HR 88 | Resp 16 | Wt 212.4 lb

## 2021-07-04 DIAGNOSIS — I10 Essential (primary) hypertension: Secondary | ICD-10-CM

## 2021-07-04 DIAGNOSIS — E785 Hyperlipidemia, unspecified: Secondary | ICD-10-CM

## 2021-07-04 DIAGNOSIS — E66812 Obesity, class 2: Secondary | ICD-10-CM

## 2021-07-04 DIAGNOSIS — Z6836 Body mass index (BMI) 36.0-36.9, adult: Secondary | ICD-10-CM

## 2021-07-04 DIAGNOSIS — R21 Rash and other nonspecific skin eruption: Secondary | ICD-10-CM

## 2021-07-04 DIAGNOSIS — E1169 Type 2 diabetes mellitus with other specified complication: Secondary | ICD-10-CM

## 2021-07-04 DIAGNOSIS — E1159 Type 2 diabetes mellitus with other circulatory complications: Secondary | ICD-10-CM

## 2021-07-04 DIAGNOSIS — Z23 Encounter for immunization: Secondary | ICD-10-CM

## 2021-07-04 LAB — POCT GLYCOSYLATED HEMOGLOBIN (HGB A1C): HbA1c, POC (controlled diabetic range): 6.3 % (ref 0.0–7.0)

## 2021-07-04 MED ORDER — LISINOPRIL 10 MG PO TABS
10.0000 mg | ORAL_TABLET | Freq: Every day | ORAL | 1 refills | Status: DC
Start: 2021-07-04 — End: 2021-11-02

## 2021-07-04 MED ORDER — ATORVASTATIN CALCIUM 10 MG PO TABS: 10.0000 mg | ORAL_TABLET | Freq: Every day | ORAL | 6 refills | Status: DC

## 2021-07-04 MED ORDER — METFORMIN HCL 500 MG PO TABS: 500.0000 mg | ORAL_TABLET | Freq: Every day | ORAL | 3 refills | Status: DC

## 2021-07-04 MED ORDER — CLOTRIMAZOLE-BETAMETHASONE 1-0.05 % EX CREA: 1.0000 "application " | TOPICAL_CREAM | Freq: Two times a day (BID) | CUTANEOUS | 3 refills | Status: DC

## 2021-07-04 NOTE — Progress Notes (Signed)
Patient ID: Aimee [name], female    DOB: June 27, 1976  MRN: [id number]  CC: Diabetes and Hypertension   Subjective: Aimee [name] is a 45 y.o. female who presents for chronic ds management Her concerns today include:  pt with history of diabetes type 2, HTN, HL, SAH (12/25/2016 with PCA aneurysm status post coiling),  hx DVT LLE and RLE, completed course of Xarelto   HM:  had flu shot 04/05/21 at [place]. Due for PCV 15.    DIABETES TYPE 2/Obesity Last A1C:   Results for orders placed or performed in visit on 07/04/21  POCT glycosylated hemoglobin (Hb A1C)  Result Value Ref Range   Hemoglobin A1C     HbA1c POC (<> result, manual entry)     HbA1c, POC (prediabetic range)     HbA1c, POC (controlled diabetic range) 6.3 0.0 - 7.0 %    Med Adherence:  '[x]'  Yes on metformin   '[]'  No Medication side effects:  '[]'  Yes    '[x]'  No Home Monitoring?  '[x]'  Yes  once a day alternating times   Home glucose results range: a.m 100-110, afternoon 100-120 Diet Adherence: '[x]'  Yes -"I'm trying to do good." Exercise: '[x]'  Yes -walking 30 mins 4x/wk.  Wgh stable from 4 mths ago Hypoglycemic episodes?: '[]'  Yes    '[x]'  No Numbness of the feet? '[]'  Yes    '[x]'  No Retinopathy hx? '[]'  Yes    '[x]'  No Last eye exam:  Comments:   HYPERTENSION Currently taking: see medication list Med Adherence: '[x]'  Yes -Lisinopril 10 mg daily   '[]'  No Medication side effects: '[]'  Yes    '[x]'  No Adherence with salt restriction: '[x]'  Yes    '[]'  No Home Monitoring?: '[x]'  Yes    '[]'  No Monitoring Frequency:  daily Home BP results range:  120125/79-80 SOB? '[]'  Yes    '[x]'  No Chest Pain?: '[]'  Yes    '[x]'  No Leg swelling?: '[]'  Yes    '[x]'  No Headaches?: '[]'  Yes    '[x]'  No Dizziness? '[]'  Yes    '[x]'  No Comments:   HL:  taking and tolerating Lipitor  Gets eruption on hands during the winter season.  She was given Lotrisone in the past for this.  She is requesting a refill to keep on hand for this winter to use as  needed.  Patient Active Problem List   Diagnosis Date Noted   Gastroesophageal reflux disease without esophagitis 10/31/2020   Fibroadenoma of right breast 12/16/2018   Insomnia 12/16/2018   Rotator cuff syndrome of right shoulder 06/26/2017   Type 2 diabetes mellitus without complication, without long-term current use of insulin ([place]) 04/11/2017   Reactive hypertension    Class 1 obesity due to excess calories with serious comorbidity and body mass index (BMI) of 30.0 to 30.9 in adult    Essential hypertension    Diabetes mellitus type 2 in obese [facility])    Subarachnoid hemorrhage due to ruptured aneurysm ([place]) 12/25/2016     Current Outpatient Medications on File Prior to Visit  Medication Sig Dispense Refill   aspirin 81 MG chewable tablet Chew by mouth daily.     Blood Glucose Monitoring Suppl (TRUE METRIX METER) w/Device KIT Use as directed 1 kit 0   glucose blood (TRUE METRIX BLOOD GLUCOSE TEST) test strip Use to check blood sugar once daily. E11.69 100 each 0   nortriptyline (PAMELOR) 25 MG capsule Take 1 capsule (25 mg total) by mouth at bedtime. 30 capsule  8   omeprazole (PRILOSEC) 20 MG capsule Take 20 mg by mouth daily.     TRUEPLUS LANCETS 28G MISC Use as directed 100 each 4   No current facility-administered medications on file prior to visit.    No Known Allergies  Social History   Socioeconomic History   Marital status: Single    Spouse name: Not on file   Number of children: 0   Years of education: Not on file   Highest education level: Bachelor's degree (e.g., BA, AB, BS)  Occupational History   Not on file  Tobacco Use   Smoking status: Never   Smokeless tobacco: Never  Vaping Use   Vaping Use: Never used  Substance and Sexual Activity   Alcohol use: No   Drug use: No   Sexual activity: Not Currently  Other Topics Concern   Not on file  Social History Narrative   Right Handed   Lives in a one story   Drinks caffeine   Social Determinants of  Health   Financial Resource Strain: Not on file  Food Insecurity: Not on file  Transportation Needs: Not on file  Physical Activity: Not on file  Stress: Not on file  Social Connections: Not on file  Intimate Partner Violence: Not on file    Family History  Problem Relation Age of Onset   Stroke Mother    Hypertension Mother    Diabetes Father     Past Surgical History:  Procedure Laterality Date   IR ANGIO INTRA EXTRACRAN SEL INTERNAL CAROTID BILAT MOD SED  12/25/2016   IR ANGIO VERTEBRAL SEL VERTEBRAL UNI L MOD SED  12/25/2016   IR ANGIOGRAM FOLLOW UP STUDY  12/25/2016   IR ANGIOGRAM FOLLOW UP STUDY  12/25/2016   IR ANGIOGRAM FOLLOW UP STUDY  12/25/2016   IR ANGIOGRAM FOLLOW UP STUDY  12/25/2016   IR TRANSCATH/EMBOLIZ  12/25/2016   RADIOLOGY WITH ANESTHESIA N/A 12/25/2016   Procedure: RADIOLOGY WITH ANESTHESIA;  Surgeon: Consuella Lose, MD;  Location: [place];  Service: Radiology;  Laterality: N/A;    ROS: Review of Systems Negative except as stated above  PHYSICAL EXAM: BP 115/83   Pulse 88   Resp 16   Wt 212 lb 6.4 oz (96.3 kg)   SpO2 98%   BMI 36.46 kg/m   Wt Readings from Last 3 Encounters:  07/04/21 212 lb 6.4 oz (96.3 kg)  05/29/21 216 lb 3.2 oz (98.1 kg)  03/03/21 213 lb 9.6 oz (96.9 kg)    Physical Exam  General appearance - alert, well appearing, and in no distress Mental status - normal mood, behavior, speech, dress, motor activity, and thought processes Neck - supple, no significant adenopathy Chest - clear to auscultation, no wheezes, rales or rhonchi, symmetric air entry Heart - normal rate, regular rhythm, normal S1, S2, no murmurs, rubs, clicks or gallops Extremities - peripheral pulses normal, no pedal edema, no clubbing or cyanosis Skin - normal coloration and turgor, no rashes, no suspicious skin lesions noted   CMP Latest Ref Rng & Units 11/02/2020 09/17/2019 05/08/2018  Glucose 65 - 99 mg/dL 106(H) 93 82  BUN 6 - 24 mg/dL '17 13 15   ' Creatinine 0.57 - 1.00 mg/dL 0.72 0.67 0.62  Sodium 134 - 144 mmol/L 138 139 140  Potassium 3.5 - 5.2 mmol/L 4.7 5.0 5.0  Chloride 96 - 106 mmol/L 103 104 101  CO2 20 - 29 mmol/L 21 19(L) 25  Calcium 8.7 - 10.2 mg/dL 9.5 9.4 9.5  Total Protein 6.0 - 8.5 g/dL 7.3 7.2 7.2  Total Bilirubin 0.0 - 1.2 mg/dL 0.5 0.7 0.4  Alkaline Phos 44 - 121 IU/L 101 79 82  AST 0 - 40 IU/L '21 14 12  ' ALT 0 - 32 IU/L '18 13 9   ' Lipid Panel     Component Value Date/Time   CHOL 121 11/02/2020 0910   TRIG 166 (H) 11/02/2020 0910   HDL 40 11/02/2020 0910   CHOLHDL 3.0 11/02/2020 0910   LDLCALC 53 11/02/2020 0910    CBC    Component Value Date/Time   WBC 7.3 11/02/2020 0910   WBC 6.3 02/05/2017 0443   RBC 5.24 11/02/2020 0910   RBC 4.16 02/05/2017 0443   HGB 14.3 11/02/2020 0910   HCT 43.9 11/02/2020 0910   PLT 356 11/02/2020 0910   MCV 84 11/02/2020 0910   MCH 27.3 11/02/2020 0910   MCH 27.2 02/05/2017 0443   MCHC 32.6 11/02/2020 0910   MCHC 31.1 02/05/2017 0443   RDW 14.6 11/02/2020 0910   LYMPHSABS 1.8 02/05/2017 0443   MONOABS 0.4 02/05/2017 0443   EOSABS 0.1 02/05/2017 0443   BASOSABS 0.0 02/05/2017 0443    ASSESSMENT AND [name]: 1. Type 2 diabetes mellitus with other circulatory complication, without long-term current use of insulin (HCC) At goal.  Continue healthy eating habits and regular exercise.  Continue metformin. - POCT glycosylated hemoglobin (Hb A1C) - metFORMIN (GLUCOPHAGE) 500 MG tablet; Take 1 tablet (500 mg total) by mouth daily with breakfast.  Dispense: 90 tablet; Refill: 3  2. Essential hypertension Close to goal.  Continue lisinopril - lisinopril (ZESTRIL) 10 MG tablet; Take 1 tablet (10 mg total) by mouth daily.  Dispense: 90 tablet; Refill: 1  3. Class 2 severe obesity due to excess calories with serious comorbidity and body mass index (BMI) of 36.0 to 36.9 in adult [facility]) See #1 above.  4. Hyperlipidemia associated with type 2 diabetes mellitus (HCC)  -  atorvastatin (LIPITOR) 10 MG tablet; Take 1 tablet (10 mg total) by mouth daily.  Dispense: 30 tablet; Refill: 6  5. Rash and nonspecific skin eruption - clotrimazole-betamethasone (LOTRISONE) cream; Apply 1 application topically 2 (two) times daily. For 10 days then as needed  Dispense: 30 g; Refill: 3  6. Need for vaccination against Streptococcus pneumoniae Patient is due for PCV 15.  However we are currently out of it.  We will [name] to give it to her on her next visit.  Patient was given the opportunity to ask questions.  Patient verbalized understanding of the [name] and was able to repeat key elements of the [name].   Orders Placed This Encounter  Procedures   POCT glycosylated hemoglobin (Hb A1C)     Requested Prescriptions   Signed Prescriptions Disp Refills   metFORMIN (GLUCOPHAGE) 500 MG tablet 90 tablet 3    Sig: Take 1 tablet (500 mg total) by mouth daily with breakfast.   atorvastatin (LIPITOR) 10 MG tablet 30 tablet 6    Sig: Take 1 tablet (10 mg total) by mouth daily.   lisinopril (ZESTRIL) 10 MG tablet 90 tablet 1    Sig: Take 1 tablet (10 mg total) by mouth daily.   clotrimazole-betamethasone (LOTRISONE) cream 30 g 3    Sig: Apply 1 application topically 2 (two) times daily. For 10 days then as needed    Return in about 4 months (around 11/01/2021).  Karle Plumber, MD, FACP

## 2021-07-06 ENCOUNTER — Telehealth: Payer: Self-pay | Admitting: Internal Medicine

## 2021-07-06 NOTE — Telephone Encounter (Signed)
Copied from [place] [id number]. Topic: General - Other >> Jul 04, 2021 10:19 AM [place] wrote: CRM # [id number] Owner: None Status: Unresolved Priority: Routine Created on: 05/31/2021 12:57 PM By: Tessa Lerner A  Primary Information  Source Subject Topic Aimee [name], Aimee [name] (Patient) Aimee [name], Aimee [name] (Patient) Appointment Scheduling - Scheduling Inquiry for Clinic Summary: financial counseling appt req Reason for CRM: The patient would like to be seen for orange card renewal   Please contact further New Message, pt had called in Oct please fu as she needs renewal cb Aimee [name]

## 2021-07-07 NOTE — Telephone Encounter (Signed)
I return Pt call, schedule a financial appt for 07/13/21

## 2021-07-13 ENCOUNTER — Ambulatory Visit: Payer: Self-pay | Attending: Internal Medicine

## 2021-07-13 ENCOUNTER — Other Ambulatory Visit: Payer: Self-pay

## 2021-09-06 ENCOUNTER — Ambulatory Visit: Payer: Self-pay

## 2021-09-06 NOTE — Telephone Encounter (Signed)
2nd attempt, Pt called, VM not set up. Will try again.

## 2021-09-06 NOTE — Telephone Encounter (Signed)
°  Chief Complaint: ears stopped up Symptoms: ears stopped up, decreased hearing, itching Frequency: 1 week Pertinent Negatives: Patient denies pain Disposition: [] ED /[] Urgent Care (no appt availability in office) / [x] Appointment(In office/virtual)/ []  [facility]/ [] Home Care/ [] Refused Recommended Disposition /[] [facility]/ []  Follow-up with PCP Additional Notes:    Reason for Disposition  Earache  (Exceptions: brief ear pain of < 60 minutes duration, earache occurring during air travel  Answer Assessment - Initial Assessment Questions 1. LOCATION: "Which ear is involved?"     Both ears 2. ONSET: "When did the ear start hurting"      1 week 3. SEVERITY: "How bad is the pain?"  (Scale 1-10; mild, moderate or severe)   - MILD (1-3): doesn't interfere with normal activities    - MODERATE (4-7): interferes with normal activities or awakens from sleep    - SEVERE (8-10): excruciating pain, unable to do any normal activities      Mild and itching  4. URI SYMPTOMS: "Do you have a runny nose or cough?"     yes 5. FEVER: "Do you have a fever?" If Yes, ask: "What is your temperature, how was it measured, and when did it start?"     No  7. OTHER SYMPTOMS: "Do you have any other symptoms?" (e.g., headache, stiff neck, dizziness, vomiting, runny nose, decreased hearing)     No  Protocols used: Earache-A-AH

## 2021-09-06 NOTE — Telephone Encounter (Signed)
Pt called, unable to leave VM d/t VM not set up  Summary: sinus /ear issues   Pt is having sinus issues, itching in her ears.  She said especially her ears have been really bothering her. She would like to talk to the dr about it.

## 2021-09-07 ENCOUNTER — Encounter: Payer: Self-pay | Admitting: Physician Assistant

## 2021-09-07 ENCOUNTER — Other Ambulatory Visit: Payer: Self-pay

## 2021-09-07 ENCOUNTER — Ambulatory Visit: Payer: Self-pay | Attending: Physician Assistant | Admitting: Physician Assistant

## 2021-09-07 VITALS — BP 120/85 | HR 86 | Resp 16 | Wt 213.0 lb

## 2021-09-07 DIAGNOSIS — H6120 Impacted cerumen, unspecified ear: Secondary | ICD-10-CM

## 2021-09-07 DIAGNOSIS — E1159 Type 2 diabetes mellitus with other circulatory complications: Secondary | ICD-10-CM

## 2021-09-07 LAB — GLUCOSE, POCT (MANUAL RESULT ENTRY): POC Glucose: 144 mg/dL — AB (ref 70–99)

## 2021-09-07 NOTE — Progress Notes (Signed)
Patient ID: Aimee [name], female   DOB: March 26, 1976, 46 y.o.   MRN: [id number]   Aimee [name], is a 46 y.o. female  CBS:[id number]  WGY:[id number]  DOB - 21-Nov-1975  Chief Complaint  Patient presents with   Ear Fullness       Subjective:   Aimee [name] is a 46 y.o. female here today for ears being stopped up.  R>L.  No ear pain.  No recent cold/URI  No problems updated.  ALLERGIES: No Known Allergies  PAST MEDICAL HISTORY: Past Medical History:  Diagnosis Date   Diabetes mellitus without complication ([place])    Hypertension    Stroke [facility])    Dec 25 2016    MEDICATIONS AT HOME: Prior to Admission medications   Medication Sig Start Date End Date Taking? Authorizing Provider  aspirin 81 MG chewable tablet Chew by mouth daily.   Yes [provider]  atorvastatin (LIPITOR) 10 MG tablet Take 1 tablet (10 mg total) by mouth daily. 07/04/21  Yes Ladell Pier, MD  Blood Glucose Monitoring Suppl (TRUE METRIX METER) w/Device KIT Use as directed 02/26/17  Yes Ladell Pier, MD  clotrimazole-betamethasone (LOTRISONE) cream Apply 1 application topically 2 (two) times daily. For 10 days then as needed 07/04/21  Yes Ladell Pier, MD  glucose blood (TRUE METRIX BLOOD GLUCOSE TEST) test strip Use to check blood sugar once daily. E11.69 06/22/20  Yes Ladell Pier, MD  lisinopril (ZESTRIL) 10 MG tablet Take 1 tablet (10 mg total) by mouth daily. 07/04/21  Yes Ladell Pier, MD  metFORMIN (GLUCOPHAGE) 500 MG tablet Take 1 tablet (500 mg total) by mouth daily with breakfast. 07/04/21  Yes Ladell Pier, MD  nortriptyline (PAMELOR) 25 MG capsule Take 1 capsule (25 mg total) by mouth at bedtime. 05/29/21  Yes Tomi Likens, Adam R, DO  omeprazole (PRILOSEC) 20 MG capsule Take 20 mg by mouth daily.   Yes [provider]  TRUEPLUS LANCETS 28G MISC Use as directed 05/08/18  Yes Ladell Pier, MD    ROS: Neg HEENT Neg  resp Neg cardiac Neg GI Neg GU Neg MS Neg psych Neg neuro  Objective:   Vitals:   09/07/21 0912  BP: 120/85  Pulse: 86  Resp: 16  SpO2: 100%  Weight: 213 lb (96.6 kg)   Exam General appearance : Awake, alert, not in any distress. Speech Clear. Not toxic looking HEENT: Atraumatic and Normocephalic L TM WNL with minimal wax in canal.  R TM blocked by cerumen and cerumen is DEEP in canal Chest: Good air entry bilaterally, CTAB.  No rales/rhonchi/wheezing CVS: S1 S2 regular, no murmurs.  Neurology: Awake alert, and oriented X 3, CN II-XII intact, Non focal Skin: No Rash  Data Review Lab Results  Component Value Date   HGBA1C 6.3 07/04/2021   HGBA1C 6.2 03/03/2021   HGBA1C 6.7 (H) 11/02/2020    Assessment & [name]   1. Type 2 diabetes mellitus with other circulatory complication, without long-term current use of insulin (HCC) Continue current regimen and work on diabetic diet - Glucose (CBG)  2. Impacted cerumen, unspecified laterality - Ambulatory referral to ENT    Patient have been counseled extensively about nutrition and exercise. Other issues discussed during this visit include: low cholesterol diet, weight control and daily exercise, foot care, annual eye examinations at Ophthalmology, importance of adherence with medications and regular follow-up. We also discussed long term complications of uncontrolled diabetes and hypertension.  Return for keep next appt with Dr Johnson. ° °The patient was given clear instructions to go to ER or return to medical center if symptoms don't improve, worsen or new problems develop. The patient verbalized understanding. The patient was told to call to get lab results if they haven't heard anything in the next week.  ° ° ° ° °Angela McClung, PA-C °[facility] °[place], [place] °[phone]   °09/07/2021, 9:17 AM  °

## 2021-11-02 ENCOUNTER — Encounter: Payer: Self-pay | Admitting: Internal Medicine

## 2021-11-02 ENCOUNTER — Ambulatory Visit: Payer: Self-pay | Attending: Internal Medicine | Admitting: Internal Medicine

## 2021-11-02 VITALS — BP 110/80 | HR 106 | Resp 16 | Wt 208.8 lb

## 2021-11-02 DIAGNOSIS — E785 Hyperlipidemia, unspecified: Secondary | ICD-10-CM

## 2021-11-02 DIAGNOSIS — E1159 Type 2 diabetes mellitus with other circulatory complications: Secondary | ICD-10-CM

## 2021-11-02 DIAGNOSIS — Z6835 Body mass index (BMI) 35.0-35.9, adult: Secondary | ICD-10-CM

## 2021-11-02 DIAGNOSIS — E1169 Type 2 diabetes mellitus with other specified complication: Secondary | ICD-10-CM

## 2021-11-02 DIAGNOSIS — I10 Essential (primary) hypertension: Secondary | ICD-10-CM

## 2021-11-02 DIAGNOSIS — J301 Allergic rhinitis due to pollen: Secondary | ICD-10-CM

## 2021-11-02 LAB — POCT GLYCOSYLATED HEMOGLOBIN (HGB A1C): HbA1c, POC (controlled diabetic range): 6.5 % (ref 0.0–7.0)

## 2021-11-02 MED ORDER — ATORVASTATIN CALCIUM 10 MG PO TABS: 10.0000 mg | ORAL_TABLET | Freq: Every day | ORAL | 6 refills | Status: DC

## 2021-11-02 MED ORDER — LISINOPRIL 10 MG PO TABS: 10.0000 mg | ORAL_TABLET | Freq: Every day | ORAL | 1 refills | Status: DC

## 2021-11-02 NOTE — Progress Notes (Signed)
 Patient ID: Aimee [name], female    DOB: Mar 14, 1976  MRN: [id number]  CC: chronic ds management   Subjective: Aimee [name] is a 46 y.o. 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  DIABETES TYPE 2/Obesity Last A1C:   Results for orders placed or performed in visit on 11/02/21  POCT glycosylated hemoglobin (Hb A1C)  Result Value Ref Range   Hemoglobin A1C     HbA1c POC (<> result, manual entry)     HbA1c, POC (prediabetic range)     HbA1c, POC (controlled diabetic range) 6.5 0.0 - 7.0 %    Med Adherence:  [x]  Yes Metformin  500 mg daily   []  No Medication side effects:  []  Yes    [x]  No Home Monitoring?  [x]  Yes   before BF. Home glucose results range: 120-125 Diet Adherence: [x]  Yes  down 5 lbs since last mth  Exercise: [x]  Yes -walking 30 mins 3-4x/wk    Hypoglycemic episodes?: []  Yes    [x]  No Numbness of the feet? [x]  Yes -in RT foot since CVA Retinopathy hx? []  Yes    [x]  No Last eye exam: 01/2021 Comments:   HYPERTENSION Currently taking: see medication list Med Adherence: [x]  Yes -Lisinopril 10 mg daily   []  No Medication side effects: []  Yes    [x]  No Adherence with salt restriction: [x]  Yes    []  No Home Monitoring?: [x]  Yes    []  No Monitoring Frequency:  daily Home BP results range:  118/78 this a.m SOB? []  Yes    [x]  No Chest Pain?: []  Yes    [x]  No Leg swelling?: []  Yes    [x]  No Headaches?: []  Yes    [x]  No Dizziness? []  Yes    [x]  No Comments:   HL:  taking Lipitor and tolerating  Has some allergies this wk with sneezing and itchy throat.  Takes Claritin and allergy nasal spray with good results.  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     Blood Glucose Monitoring Suppl (TRUE METRIX METER) w/Device KIT Use as directed 1 kit 0   clotrimazole-betamethasone (LOTRISONE) cream Apply 1 application topically 2 (two) times daily. For 10 days then as needed 30 g 3   glucose blood (TRUE METRIX BLOOD GLUCOSE TEST) test strip Use to check blood sugar once daily. E11.69 100 each 0   metFORMIN  (GLUCOPHAGE ) 500 MG tablet Take 1 tablet (500 mg total) by mouth daily with breakfast. 90 tablet 3   nortriptyline (PAMELOR) 25 MG capsule Take 1 capsule (25  mg total) by mouth at bedtime. 30 capsule 8   omeprazole (PRILOSEC) 20 MG capsule Take 20 mg by mouth daily.     TRUEPLUS LANCETS 28G MISC Use as directed 100 each 4   No current facility-administered medications on file prior to visit.    No Known Allergies  Social History   Socioeconomic History   Marital status: Single    Spouse name: Not on file   Number of children: 0   Years of education: Not on file   Highest education level: Bachelor's degree (e.g., BA, AB, BS)  Occupational History   Not on file  Tobacco Use   Smoking status: Never   Smokeless tobacco: Never  Vaping Use   Vaping Use: Never used  Substance and Sexual Activity   Alcohol use: No   Drug use: No   Sexual activity: Not Currently  Other Topics Concern   Not on file  Social History Narrative    Right Handed   Lives in a one story   Drinks caffeine   Social Determinants of Health   Financial Resource Strain: Not on file  Food Insecurity: Not on file  Transportation Needs: Not on file  Physical Activity: Not on file  Stress: Not on file  Social Connections: Not on file  Intimate Partner Violence: Not on file    Family History  Problem Relation Age of Onset   Stroke Mother    Hypertension Mother    Diabetes Father     Past Surgical History:  Procedure Laterality Date   IR ANGIO INTRA EXTRACRAN SEL INTERNAL CAROTID BILAT MOD SED  12/25/2016   IR ANGIO VERTEBRAL SEL VERTEBRAL UNI L MOD SED  12/25/2016   IR ANGIOGRAM FOLLOW UP STUDY  12/25/2016   IR ANGIOGRAM FOLLOW UP STUDY  12/25/2016   IR ANGIOGRAM FOLLOW UP STUDY  12/25/2016   IR ANGIOGRAM FOLLOW UP STUDY  12/25/2016   IR TRANSCATH/EMBOLIZ  12/25/2016   RADIOLOGY WITH ANESTHESIA N/A 12/25/2016   Procedure: RADIOLOGY WITH ANESTHESIA;  Surgeon: Augusto Blonder, MD;  Location: [facility] OR;  Service: Radiology;  Laterality: N/A;    ROS: Review of Systems Negative except as stated above  PHYSICAL EXAM: BP 110/80   Pulse (!) 106   Resp 16   Wt 208 lb 12.8 oz (94.7 kg)   SpO2 99%   BMI 35.84 kg/m   Physical Exam  General appearance - alert, well appearing, middle-age Hispanic female and in no distress Mental status - normal mood, behavior, speech, dress, motor activity, and thought processes Neck - supple, no significant adenopathy Chest - clear to auscultation, no wheezes, rales or rhonchi, symmetric air entry Heart - normal rate, regular rhythm, normal S1, S2, no murmurs, rubs, clicks or gallops Extremities - peripheral pulses normal, no pedal edema, no clubbing or cyanosis      Latest Ref Rng & Units 11/02/2020    9:10 AM 09/17/2019    9:32 AM 05/08/2018    3:45 PM  CMP  Glucose 65 - 99 mg/dL 161   93   82    BUN 6 - 24 mg/dL 17   13   15     Creatinine 0.57 - 1.00 mg/dL 0.96   0.45   4.09    Sodium 134 - 144  mmol/L 138   139   140    Potassium 3.5 - 5.2 mmol/L 4.7   5.0   5.0    Chloride 96 - 106 mmol/L 103   104  101    CO2 20 - 29 mmol/L 21   19   25     Calcium 8.7 - 10.2 mg/dL 9.5   9.4   9.5    Total Protein 6.0 - 8.5 g/dL 7.3   7.2   7.2    Total Bilirubin 0.0 - 1.2 mg/dL 0.5   0.7   0.4    Alkaline Phos 44 - 121 IU/L 101   79   82    AST 0 - 40 IU/L 21   14   12     ALT 0 - 32 IU/L 18   13   9      Lipid Panel     Component Value Date/Time   CHOL 121 11/02/2020 0910   TRIG 166 (H) 11/02/2020 0910   HDL 40 11/02/2020 0910   CHOLHDL 3.0 11/02/2020 0910   LDLCALC 53 11/02/2020 0910    CBC    Component Value Date/Time   WBC 7.3 11/02/2020 0910   WBC 6.3 02/05/2017 0443   RBC 5.24 11/02/2020 0910   RBC 4.16 02/05/2017 0443   HGB 14.3 11/02/2020 0910   HCT 43.9 11/02/2020 0910   PLT 356 11/02/2020 0910   MCV 84 11/02/2020 0910   MCH 27.3 11/02/2020 0910   MCH 27.2 02/05/2017 0443   MCHC 32.6 11/02/2020 0910   MCHC 31.1 02/05/2017 0443   RDW 14.6 11/02/2020 0910   LYMPHSABS 1.8 02/05/2017 0443   MONOABS 0.4 02/05/2017 0443   EOSABS 0.1 02/05/2017 0443   BASOSABS 0.0 02/05/2017 0443    ASSESSMENT AND PLAN: 1. Type 2 diabetes mellitus with other circulatory complication, without long-term current use of insulin  (HCC) At goal.  Continue metformin , healthy eating habits and regular exercise. - POCT glycosylated hemoglobin (Hb A1C) - CBC - Comprehensive metabolic panel - Microalbumin / creatinine urine ratio  2. Essential hypertension At goal.  Continue lisinopril - lisinopril (ZESTRIL) 10 MG tablet; Take 1 tablet (10 mg total) by mouth daily.  Dispense: 90 tablet; Refill: 1  3. Hyperlipidemia associated with type 2 diabetes mellitus (HCC) - Lipid panel - atorvastatin (LIPITOR) 10 MG tablet; Take 1 tablet (10 mg total) by mouth daily.  Dispense: 30 tablet; Refill: 6  4. Class 2 severe obesity due to excess calories with serious comorbidity and body mass index (BMI) of  35.0 to 35.9 in adult [facility]) See #1 above  5. Seasonal allergic rhinitis due to pollen Advised it is okay for her to continue to take loratadine and Flonase nasal spray from over-the-counter.     Patient was given the opportunity to ask questions.  Patient verbalized understanding of the plan and was able to repeat key elements of the plan.   This documentation was completed using Paediatric nurse.  Any transcriptional errors are unintentional.  Orders Placed This Encounter  Procedures   CBC   Comprehensive metabolic panel   Lipid panel   Microalbumin / creatinine urine ratio   POCT glycosylated hemoglobin (Hb A1C)     Requested Prescriptions   Signed Prescriptions Disp Refills   lisinopril (ZESTRIL) 10 MG tablet 90 tablet 1    Sig: Take 1 tablet (10 mg total) by mouth daily.   atorvastatin (LIPITOR) 10 MG tablet 30 tablet 6    Sig: Take 1 tablet (10 mg total) by mouth daily.    Return in about 4 months (around 03/04/2022).  Concetta Dee, MD, FACP

## 2021-11-03 LAB — COMPREHENSIVE METABOLIC PANEL WITH GFR
ALT: 26 IU/L (ref 0–32)
AST: 20 IU/L (ref 0–40)
Albumin/Globulin Ratio: 1.5 (ref 1.2–2.2)
Albumin: 4.7 g/dL (ref 3.8–4.8)
Alkaline Phosphatase: 99 IU/L (ref 44–121)
BUN/Creatinine Ratio: 24 — ABNORMAL HIGH (ref 9–23)
BUN: 18 mg/dL (ref 6–24)
Bilirubin Total: 0.6 mg/dL (ref 0.0–1.2)
CO2: 23 mmol/L (ref 20–29)
Calcium: 9.9 mg/dL (ref 8.7–10.2)
Chloride: 105 mmol/L (ref 96–106)
Creatinine, Ser: 0.74 mg/dL (ref 0.57–1.00)
Globulin, Total: 3.1 g/dL (ref 1.5–4.5)
Glucose: 111 mg/dL — ABNORMAL HIGH (ref 70–99)
Potassium: 4.7 mmol/L (ref 3.5–5.2)
Sodium: 146 mmol/L — ABNORMAL HIGH (ref 134–144)
Total Protein: 7.8 g/dL (ref 6.0–8.5)
eGFR: 101 mL/min/{1.73_m2} (ref >59)

## 2021-11-03 LAB — MICROALBUMIN / CREATININE URINE RATIO
Creatinine, Urine: 148.5 mg/dL
Microalb/Creat Ratio: 6 mg/g{creat} (ref 0–29)
Microalbumin, Urine: 8.5 ug/mL

## 2021-11-03 LAB — CBC
Hematocrit: 41 % (ref 34.0–46.6)
Hemoglobin: 14.2 g/dL (ref 11.1–15.9)
MCH: 28.7 pg (ref 26.6–33.0)
MCHC: 34.6 g/dL (ref 31.5–35.7)
MCV: 83 fL (ref 79–97)
Platelets: 424 10*3/uL (ref 150–450)
RBC: 4.95 x10E6/uL (ref 3.77–5.28)
RDW: 13.8 % (ref 11.7–15.4)
WBC: 7.6 10*3/uL (ref 3.4–10.8)

## 2021-11-03 LAB — LIPID PANEL
Chol/HDL Ratio: 3.1 ratio (ref 0.0–4.4)
Cholesterol, Total: 119 mg/dL (ref 100–199)
HDL: 39 mg/dL — ABNORMAL LOW (ref >39)
LDL Chol Calc (NIH): 46 mg/dL (ref 0–99)
Triglycerides: 209 mg/dL — ABNORMAL HIGH (ref 0–149)
VLDL Cholesterol Cal: 34 mg/dL (ref 5–40)

## 2021-11-04 NOTE — Progress Notes (Signed)
Let patient know that she has a mild elevation in the sodium level.  Be sure to drink several glasses of water daily.  Kidney and liver function tests are good.  Triglyceride level mildly elevated.  Continue atorvastatin.  May consider taking a fish oil pill daily.  This can be purchased over-the-counter.  Blood cell counts are normal.

## 2021-11-06 ENCOUNTER — Telehealth: Payer: Self-pay

## 2021-11-06 NOTE — Telephone Encounter (Signed)
Contacted pt to go over lab results pt is aware and doesn't have any questions or concerns 

## 2022-02-26 NOTE — Progress Notes (Signed)
 NEUROLOGY FOLLOW UP OFFICE NOTE  Aimee [name] [id number]  Assessment/Plan:   1. Right sided posttraumatic headache - doing very well. Interested in tapering off nortriptyline. 2.  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  She will decrease nortriptyline to 10mg  at bedtime for 3 months and then discontinue.  We will see how she does off medication afterwards. 2.  Limit use of pain relievers to no more than 2 days out of week to prevent risk of rebound or medication-overuse headache. 3.  Headache diary 4.  Follow up 6 months.   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   UPDATE: Intensity:  moderate (rarely severe) Duration:  Couple of hours Frequency: 1 every 2 months.   Frequency of abortive medication: 4 days a month Current NSAIDS/analgesics:  Tylenol  500mg  Current triptans:  none Current ergotamine:  none Current anti-emetic:  none Current muscle relaxants:  none Current Antihypertensive medications:  Lisinopril  Current Antidepressant medications:  nortriptyline 25mg  QHS Current Anticonvulsant medications:  none Current anti-CGRP:  none Current Vitamins/Herbal/Supplements:  none Current Antihistamines/Decongestants:  none Other therapy:  Cold compress on head and back of neck Hormone/birth control:  none Other medications:  Atorvastatin, metformin    Caffeine:  2 cups of coffee daily, sometimes Coke Zero Diet:  2 L of water daily. Does not skip meals. Exercise:  Tries to walk 30 minutes daily. Depression:  none; Anxiety:  some Other pain:  none Sleep hygiene:  Good.   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  Underwent coiling.  At  the time, she was placed on Keppra  for seizure prophylaxis for short time.  She describes a severe pressure pain right left anterior parietal region.  They are associated with blurred vision but no photophobia, phonophobia, nausea, vomiting, dizziness, or speech disturbance.  They typically last 3 hours and occur 1 to 3 times a week.  They are triggered by stress and relieved if she rests in bed.  When severe, she takes Tylenol  500mg  and has relief within 1 hour.  She takes Tylenol  infrequently.     Past NSAIDS/analgesics:  none Past abortive triptans:  none Past abortive ergotamine:  none Past muscle relaxants:  none Past anti-emetic:  none Past antihypertensive medications:  none Past antidepressant medications:  none Past anticonvulsant medications:  Keppra  Past anti-CGRP:  none Past vitamins/Herbal/Supplements:  none Past antihistamines/decongestants:  none Other past therapies:  none     Family history of headache:  No.     Imaging personally reviewed: 12/25/2016 CT HEAD:  Diffuse subarachnoid hemorrhage, filling CSF spaces in the suprasellar and basal cisterns as well as bilateral sulci. Cause is not determined but presents high suspicion for ruptured intracranial aneurysm. 12/25/2016 CTA HEAD & NECK:  1. 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  Aneurysm is directed posteriorly, and demonstrates a fairly narrow neck. Neuro endovascular consultation is recommended.  2. Diffuse irregularity throughout the remaining intracranial arterial vasculature, suspicious for vasospasm secondary to acute subarachnoid hemorrhage.  Normal CTA of neck. 12/26/2016 MRI BRAIN WO: 1. 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 More confluent acute infarcts in the high  frontoparietal regions bilaterally.  2. Subarachnoid and small volume intraventricular hemorrhage.  12/30/2016 MRI BRAIN WO:  1. Overall similar appearance of scattered bilateral embolic  infarcts and confluent cortical infarcts at the vertex bilaterally.  2. No new infarcts.  3. Decreasing subarachnoid and intraventricular hemorrhage. 01/30/2017 CT HEAD WO:  1. 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  No new/acute intracranial findings. 2. 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  3. Persistent/chronic left maxillary sinus disease. 4. Hyperostosis frontalis interna 06/13/2020 CTA HEAD:  1. Prominent streak artifact arising from an embolization coil mass at site of a treated right posterior communicating artery aneurysm. This limits evaluation of the paraclinoid right ICA and proximal posterior cerebral arteries. This also significantly limits evaluation for recurrent aneurysm filling. 2. No evidence of recurrent filling of the treated aneurysm. 3. No intracranial large vessel occlusion or proximal high-grade arterial stenosis. 4. No new intracranial aneurysm is identified elsewhere.  PAST MEDICAL HISTORY: Past Medical History:  Diagnosis Date   Diabetes mellitus without complication (HCC)    Hypertension    Stroke [facility])    Dec 25 2016    MEDICATIONS: Current Outpatient Medications on File Prior to Visit  Medication Sig Dispense Refill   aspirin 81 MG chewable tablet Chew by mouth daily.     atorvastatin (LIPITOR) 10 MG tablet Take 1 tablet (10 mg total) by mouth daily. 30 tablet 6   Blood Glucose Monitoring Suppl (TRUE METRIX METER) w/Device KIT Use as directed 1 kit 0   clotrimazole-betamethasone (LOTRISONE) cream Apply 1 application topically 2 (two) times daily. For 10 days then as needed 30 g 3   glucose blood (TRUE METRIX BLOOD GLUCOSE TEST) test strip Use to check blood sugar once daily. E11.69 100 each 0   lisinopril (ZESTRIL) 10 MG tablet Take 1 tablet (10 mg total) by mouth daily. 90 tablet 1   metFORMIN  (GLUCOPHAGE ) 500 MG tablet Take 1 tablet (500 mg total) by mouth daily with breakfast. 90  tablet 3   nortriptyline (PAMELOR) 25 MG capsule Take 1 capsule (25 mg total) by mouth at bedtime. 30 capsule 8   omeprazole (PRILOSEC) 20 MG capsule Take 20 mg by mouth daily.     TRUEPLUS LANCETS 28G MISC Use as directed 100 each 4   No current facility-administered medications on file prior to visit.    ALLERGIES: No Known Allergies  FAMILY HISTORY: Family History  Problem Relation Age of Onset   Stroke Mother    Hypertension Mother    Diabetes Father       Objective:  Blood pressure 116/78, pulse 89, height 5\' 4"  (1.626 m), weight 204 lb 6.4 oz (92.7 kg), SpO2 100 %. General: No acute distress.  Patient appears well-groomed.   Head:  Normocephalic/atraumatic Eyes:  Fundi examined but not visualized Neck: supple, no paraspinal tenderness, full range of motion Heart:  Regular rate and rhythm Neurological Exam: alert and oriented to person, place, and time.  Speech fluent and not dysarthric, language intact.  CN II-XII intact. Bulk and tone normal, muscle strength 5/5 throughout.  Sensation to light touch intact.  Deep tendon reflexes 2+ throughout.  Finger to nose testing intact.  Gait normal, Romberg negative.   Janne Members, DO  CC: Concetta Dee, MD

## 2022-02-27 ENCOUNTER — Encounter: Payer: Self-pay | Admitting: Neurology

## 2022-02-27 ENCOUNTER — Ambulatory Visit (INDEPENDENT_AMBULATORY_CARE_PROVIDER_SITE_OTHER): Payer: Self-pay | Admitting: Neurology

## 2022-02-27 VITALS — BP 116/78 | HR 89 | Ht 64.0 in | Wt 204.4 lb

## 2022-02-27 DIAGNOSIS — G44309 Post-traumatic headache, unspecified, not intractable: Secondary | ICD-10-CM

## 2022-02-27 DIAGNOSIS — I608 Other nontraumatic subarachnoid hemorrhage: Secondary | ICD-10-CM

## 2022-02-27 MED ORDER — NORTRIPTYLINE HCL 10 MG PO CAPS: 10.0000 mg | ORAL_CAPSULE | Freq: Every day | ORAL | 0 refills | Status: DC

## 2022-02-27 NOTE — Patient Instructions (Signed)
Stop the '25mg'$  nortriptyline.  Start '10mg'$  nortriptyline at bedtime.  Once you finish the 90 day supply, do not refill.  We will see how you do off the medication. Follow up 6 months.

## 2022-03-04 NOTE — Progress Notes (Unsigned)
Patient ID: Aimee [name], female    DOB: [birth year]  MRN: [id number]  CC: No chief complaint on file.   Subjective: Aimee [name] is a 46 y.o. 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  HYPERTENSION Currently taking: see medication list Med Adherence: '[]'  Yes    '[]'  No Medication side effects: '[]'  Yes    '[]'  No Adherence with salt restriction: '[]'  Yes    '[]'  No Home Monitoring?: '[]'  Yes    '[]'  No Monitoring Frequency:  Home BP results range:  SOB? '[]'  Yes    '[]'  No Chest Pain?: '[]'  Yes    '[]'  No Leg swelling?: '[]'  Yes    '[]'  No Headaches?: '[]'  Yes    '[]'  No Dizziness? '[]'  Yes    '[]'  No Comments:   DIABETES TYPE 2 Last A1C:   Results for orders placed or performed in visit on 11/02/21  CBC  Result Value Ref Range   WBC 7.6 3.4 - 10.8 x10E3/uL   RBC 4.95 3.77 - 5.28 x10E6/uL   Hemoglobin 14.2 11.1 - 15.9 g/dL   Hematocrit 41.0 34.0 - 46.6 %   MCV 83 79 - 97 fL   MCH 28.7 26.6 - 33.0 pg   MCHC 34.6 31.5 - 35.7 g/dL   RDW 13.8 11.7 - 15.4 %   Platelets 424 150 - 450 x10E3/uL  Comprehensive metabolic panel  Result Value Ref Range   Glucose 111 (H) 70 - 99 mg/dL   BUN 18 6 - 24 mg/dL   Creatinine, Ser 0.74 0.57 - 1.00 mg/dL   eGFR 101 >59 mL/min/1.73   BUN/Creatinine Ratio 24 (H) 9 - 23   Sodium 146 (H) 134 - 144 mmol/L   Potassium 4.7 3.5 - 5.2 mmol/L   Chloride 105 96 - 106 mmol/L   CO2 23 20 - 29 mmol/L   Calcium 9.9 8.7 - 10.2 mg/dL   Total Protein 7.8 6.0 - 8.5 g/dL   Albumin 4.7 3.8 - 4.8 g/dL   Globulin, Total 3.1 1.5 - 4.5 g/dL   Albumin/Globulin Ratio 1.5 1.2 - 2.2   Bilirubin Total 0.6 0.0 - 1.2 mg/dL   Alkaline Phosphatase 99 44 - 121 IU/L   AST 20 0 - 40 IU/L   ALT 26 0 - 32 IU/L  Lipid panel  Result Value Ref Range   Cholesterol, Total 119 100 - 199 mg/dL    Triglycerides 209 (H) 0 - 149 mg/dL   HDL 39 (L) >39 mg/dL   VLDL Cholesterol Cal 34 5 - 40 mg/dL   LDL Chol Calc (NIH) 46 0 - 99 mg/dL   Chol/HDL Ratio 3.1 0.0 - 4.4 ratio  Microalbumin / creatinine urine ratio  Result Value Ref Range   Creatinine, Urine 148.5 Not Estab. mg/dL   Microalbumin, Urine 8.5 Not Estab. ug/mL   Microalb/Creat Ratio 6 0 - 29 mg/g creat  POCT glycosylated hemoglobin (Hb A1C)  Result Value Ref Range   Hemoglobin A1C     HbA1c POC (<> result, manual entry)     HbA1c, POC (prediabetic range)     HbA1c, POC (controlled diabetic range) 6.5 0.0 - 7.0 %    Med Adherence:  '[]'  Yes    '[]'  No Medication side effects:  '[]'  Yes    '[]'  No  Home Monitoring?  '[]'  Yes    '[]'  No Home glucose results range:*** Diet Adherence: '[]'  Yes    '[]'  No Exercise: '[]'  Yes    '[]'  No Hypoglycemic episodes?: '[]'  Yes    '[]'  No Numbness of the feet? '[]'  Yes    '[]'  No Retinopathy hx? '[]'  Yes    '[]'  No Last eye exam:  Comments: ***  HL: Taking and tolerating Lipitor.  Last LDL was 46.   Patient Active Problem List   Diagnosis Date Noted   Gastroesophageal reflux disease without esophagitis 10/31/2020   Fibroadenoma of right breast 12/16/2018   Insomnia 12/16/2018   Rotator cuff syndrome of right shoulder 06/26/2017   Type 2 diabetes mellitus without complication, without long-term current use of insulin ([place]) 04/11/2017   Reactive hypertension    Class 1 obesity due to excess calories with serious comorbidity and body mass index (BMI) of 30.0 to 30.9 in adult    Essential hypertension    Diabetes mellitus type 2 in obese [facility])    Subarachnoid hemorrhage due to ruptured aneurysm ([place]) 12/25/2016     Current Outpatient Medications on File Prior to Visit  Medication Sig Dispense Refill   aspirin 81 MG chewable tablet Chew by mouth daily.     atorvastatin (LIPITOR) 10 MG tablet Take 1 tablet (10 mg total) by mouth daily. 30 tablet 6   Blood Glucose Monitoring Suppl (TRUE METRIX METER) w/Device KIT  Use as directed 1 kit 0   clotrimazole-betamethasone (LOTRISONE) cream Apply 1 application topically 2 (two) times daily. For 10 days then as needed 30 g 3   glucose blood (TRUE METRIX BLOOD GLUCOSE TEST) test strip Use to check blood sugar once daily. E11.69 100 each 0   lisinopril (ZESTRIL) 10 MG tablet Take 1 tablet (10 mg total) by mouth daily. 90 tablet 1   metFORMIN (GLUCOPHAGE) 500 MG tablet Take 1 tablet (500 mg total) by mouth daily with breakfast. 90 tablet 3   nortriptyline (PAMELOR) 10 MG capsule Take 1 capsule (10 mg total) by mouth at bedtime. 90 capsule 0   omeprazole (PRILOSEC) 20 MG capsule Take 20 mg by mouth daily.     TRUEPLUS LANCETS 28G MISC Use as directed 100 each 4   No current facility-administered medications on file prior to visit.    No Known Allergies  Social History   Socioeconomic History   Marital status: Single    Spouse name: Not on file   Number of children: 0   Years of education: Not on file   Highest education level: Bachelor's degree (e.g., BA, AB, BS)  Occupational History   Not on file  Tobacco Use   Smoking status: Never   Smokeless tobacco: Never  Vaping Use   Vaping Use: Never used  Substance and Sexual Activity   Alcohol use: No   Drug use: No   Sexual activity: Not Currently  Other Topics Concern   Not on file  Social History Narrative   Right Handed   Lives in a one story   Drinks caffeine   Social Determinants of Health   Financial Resource Strain: Not on file  Food Insecurity: Not on file  Transportation Needs: No Transportation Needs (09/18/2018)   PRAPARE - Hydrologist (Medical): No    Lack of Transportation (Non-Medical): No  Physical Activity: Inactive (09/05/2017)   Exercise Vital Sign    Days of Exercise per Week: 0 days    Minutes of Exercise per  Session: 0 min  Stress: No Stress Concern Present (09/05/2017)   [place]    Feeling of Stress : Not at all  Social Connections: Somewhat Isolated (09/05/2017)   Social Connection and Isolation Panel [NHANES]    Frequency of Communication with Friends and Family: More than three times a week    Frequency of Social Gatherings with Friends and Family: Twice a week    Attends Religious Services: More than 4 times per year    Active Member of [employer] or Organizations: No    Attends Archivist Meetings: Never    Marital Status: Never married  Intimate Partner Violence: Unknown (09/05/2017)   Humiliation, Afraid, Rape, and Kick questionnaire    Fear of Current or Ex-Partner: Patient refused    Emotionally Abused: Patient refused    Physically Abused: Patient refused    Sexually Abused: Patient refused    Family History  Problem Relation Age of Onset   Stroke Mother    Hypertension Mother    Diabetes Father     Past Surgical History:  Procedure Laterality Date   IR ANGIO INTRA EXTRACRAN SEL INTERNAL CAROTID BILAT MOD SED  12/25/2016   IR ANGIO VERTEBRAL SEL VERTEBRAL UNI L MOD SED  12/25/2016   IR ANGIOGRAM FOLLOW UP STUDY  12/25/2016   IR ANGIOGRAM FOLLOW UP STUDY  12/25/2016   IR ANGIOGRAM FOLLOW UP STUDY  12/25/2016   IR ANGIOGRAM FOLLOW UP STUDY  12/25/2016   IR TRANSCATH/EMBOLIZ  12/25/2016   RADIOLOGY WITH ANESTHESIA N/A 12/25/2016   Procedure: RADIOLOGY WITH ANESTHESIA;  Surgeon: Consuella Lose, MD;  Location: [facility];  Service: Radiology;  Laterality: N/A;    ROS: Review of Systems Negative except as stated above  PHYSICAL EXAM: There were no vitals taken for this visit.  Physical Exam  {female adult master:[id number]} {female adult master:[id number]}     Latest Ref Rng & Units 11/02/2021    3:03 PM 11/02/2020    9:10 AM 09/17/2019    9:32 AM  CMP  Glucose 70 - 99 mg/dL 111  106  93   BUN 6 - 24 mg/dL '18  17  13   ' Creatinine 0.57 - 1.00 mg/dL 0.74  0.72  0.67   Sodium 134 - 144 mmol/L 146  138  139   Potassium 3.5 - 5.2 mmol/L  4.7  4.7  5.0   Chloride 96 - 106 mmol/L 105  103  104   CO2 20 - 29 mmol/L '23  21  19   ' Calcium 8.7 - 10.2 mg/dL 9.9  9.5  9.4   Total Protein 6.0 - 8.5 g/dL 7.8  7.3  7.2   Total Bilirubin 0.0 - 1.2 mg/dL 0.6  0.5  0.7   Alkaline Phos 44 - 121 IU/L 99  101  79   AST 0 - 40 IU/L '20  21  14   ' ALT 0 - 32 IU/L '26  18  13    ' Lipid Panel     Component Value Date/Time   CHOL 119 11/02/2021 1503   TRIG 209 (H) 11/02/2021 1503   HDL 39 (L) 11/02/2021 1503   CHOLHDL 3.1 11/02/2021 1503   LDLCALC 46 11/02/2021 1503    CBC    Component Value Date/Time   WBC 7.6 11/02/2021 1503   WBC 6.3 02/05/2017 0443   RBC 4.95 11/02/2021 1503   RBC 4.16 02/05/2017 0443   HGB 14.2 11/02/2021 1503   HCT  41.0 11/02/2021 1503   PLT 424 11/02/2021 1503   MCV 83 11/02/2021 1503   MCH 28.7 11/02/2021 1503   MCH 27.2 02/05/2017 0443   MCHC 34.6 11/02/2021 1503   MCHC 31.1 02/05/2017 0443   RDW 13.8 11/02/2021 1503   LYMPHSABS 1.8 02/05/2017 0443   MONOABS 0.4 02/05/2017 0443   EOSABS 0.1 02/05/2017 0443   BASOSABS 0.0 02/05/2017 0443    ASSESSMENT AND [name]:  There are no diagnoses linked to this encounter.   Patient was given the opportunity to ask questions.  Patient verbalized understanding of the [name] and was able to repeat key elements of the [name].   This documentation was completed using Radio producer.  Any transcriptional errors are unintentional.  No orders of the defined types were placed in this encounter.    Requested Prescriptions    No prescriptions requested or ordered in this encounter    No follow-ups on file.  Karle Plumber, MD, FACP

## 2022-03-05 ENCOUNTER — Encounter: Payer: Self-pay | Admitting: Internal Medicine

## 2022-03-05 ENCOUNTER — Ambulatory Visit: Payer: Self-pay | Attending: Internal Medicine | Admitting: Internal Medicine

## 2022-03-05 VITALS — BP 113/79 | HR 74 | Temp 98.9°F | Ht 64.0 in | Wt 205.0 lb

## 2022-03-05 DIAGNOSIS — I152 Hypertension secondary to endocrine disorders: Secondary | ICD-10-CM

## 2022-03-05 DIAGNOSIS — E1159 Type 2 diabetes mellitus with other circulatory complications: Secondary | ICD-10-CM

## 2022-03-05 DIAGNOSIS — E785 Hyperlipidemia, unspecified: Secondary | ICD-10-CM

## 2022-03-05 DIAGNOSIS — E1169 Type 2 diabetes mellitus with other specified complication: Secondary | ICD-10-CM

## 2022-03-05 DIAGNOSIS — E669 Obesity, unspecified: Secondary | ICD-10-CM

## 2022-03-05 LAB — POCT GLYCOSYLATED HEMOGLOBIN (HGB A1C): HbA1c, POC (prediabetic range): 6.2 % (ref 5.7–6.4)

## 2022-03-05 LAB — GLUCOSE, POCT (MANUAL RESULT ENTRY): POC Glucose: 115 mg/dL — AB (ref 70–99)

## 2022-07-06 ENCOUNTER — Encounter: Payer: Self-pay | Admitting: Internal Medicine

## 2022-07-06 ENCOUNTER — Ambulatory Visit: Payer: Self-pay | Attending: Internal Medicine | Admitting: Internal Medicine

## 2022-07-06 VITALS — BP 121/82 | HR 83 | Temp 98.4°F | Ht 64.0 in | Wt 206.0 lb

## 2022-07-06 DIAGNOSIS — E785 Hyperlipidemia, unspecified: Secondary | ICD-10-CM

## 2022-07-06 DIAGNOSIS — E669 Obesity, unspecified: Secondary | ICD-10-CM

## 2022-07-06 DIAGNOSIS — Z1211 Encounter for screening for malignant neoplasm of colon: Secondary | ICD-10-CM

## 2022-07-06 DIAGNOSIS — I152 Hypertension secondary to endocrine disorders: Secondary | ICD-10-CM

## 2022-07-06 DIAGNOSIS — E1169 Type 2 diabetes mellitus with other specified complication: Secondary | ICD-10-CM

## 2022-07-06 DIAGNOSIS — E1159 Type 2 diabetes mellitus with other circulatory complications: Secondary | ICD-10-CM

## 2022-07-06 LAB — POCT GLYCOSYLATED HEMOGLOBIN (HGB A1C): HbA1c, POC (controlled diabetic range): 6 % (ref 0.0–7.0)

## 2022-07-06 LAB — GLUCOSE, POCT (MANUAL RESULT ENTRY): POC Glucose: 115 mg/dL — AB (ref 70–99)

## 2022-07-06 MED ORDER — METFORMIN HCL 500 MG PO TABS: 500.0000 mg | ORAL_TABLET | Freq: Every day | ORAL | 3 refills | Status: DC

## 2022-07-06 MED ORDER — ATORVASTATIN CALCIUM 10 MG PO TABS: 10.0000 mg | ORAL_TABLET | Freq: Every day | ORAL | 6 refills | Status: DC

## 2022-07-06 MED ORDER — LISINOPRIL 10 MG PO TABS: 10.0000 mg | ORAL_TABLET | Freq: Every day | ORAL | 1 refills | Status: DC

## 2022-07-06 NOTE — Progress Notes (Signed)
Patient ID: Aimee [name], female    DOB: Jun 12, 1976  MRN: [id number]  CC: Diabetes (Dm f/u. Med refill./Already received flu this season. )   Subjective: Aimee [name] is a 46 y.o. 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   DM: Results for orders placed or performed in visit on 07/06/22  POCT glucose (manual entry)  Result Value Ref Range   POC Glucose 115 (A) 70 - 99 mg/dl  POCT glycosylated hemoglobin (Hb A1C)  Result Value Ref Range   Hemoglobin A1C     HbA1c POC (<> result, manual entry)     HbA1c, POC (prediabetic range)     HbA1c, POC (controlled diabetic range) 6.0 0.0 - 7.0 %  Med Adherence:  _0  Yes Metformin 500 mg daily   _1  No Medication side effects:  _2  Yes    _3  No Home Monitoring?  _4  Yes   before BF. Home glucose results range: 110-120.  She has log book with her for review Diet Adherence: _5  Yes  up 2 lb since last visit Exercise: _6  Yes -walking 30 mins 5x/wk    Had eye exam recently  HYPERTENSION Currently taking: see medication list Med Adherence: _7  Yes -Lisinopril 10 mg daily   _8  No Medication side effects: _9  Yes    _10  No Adherence with salt restriction: _11  Yes    _12  No Home Monitoring?: _13  Yes    _14  No Monitoring Frequency:  daily Home BP results range:  118/73, 109/70, 112/77 SOB? _15  Yes    _16  No Chest Pain?: _17  Yes    _18  No Leg swelling?: _19  Yes    _20  No Headaches?: _21  Yes    _22  No Dizziness? _23  Yes    _24  No Comments:   HL:  taking and tolerating Lipitor.  Last LDL 46  HM:  flu shot 05/08/2022 at [facility]. Had eye exam 07/03/2022 by O.D Delbert Phenix.  She brings copy of report for our records.  Due for FIT  Patient Active Problem List   Diagnosis Date Noted   Gastroesophageal reflux disease without esophagitis 10/31/2020    Fibroadenoma of right breast 12/16/2018   Insomnia 12/16/2018   Rotator cuff syndrome of right shoulder 06/26/2017   Type 2 diabetes mellitus without complication, without long-term current use of insulin ([place]) 04/11/2017   Reactive hypertension    Class 1 obesity due to excess calories with serious comorbidity and body mass index (BMI) of 30.0 to 30.9 in adult    Essential hypertension    Diabetes mellitus type 2 in obese (HCC)    Subarachnoid hemorrhage due to ruptured aneurysm ([place]) 12/25/2016     Current Outpatient Medications on File Prior to Visit  Medication Sig Dispense Refill   aspirin 81 MG chewable tablet Chew by mouth daily.     Blood Glucose Monitoring Suppl (TRUE METRIX METER) w/Device KIT Use as directed 1 kit 0   clotrimazole-betamethasone (LOTRISONE) cream Apply 1 application topically 2 (two) times daily. For 10 days then as needed 30 g 3   glucose blood (TRUE METRIX BLOOD GLUCOSE TEST) test strip Use to check blood sugar once daily. E11.69 100 each 0   omeprazole (PRILOSEC) 20 MG capsule Take 20 mg by mouth daily.     TRUEPLUS LANCETS 28G MISC Use as  directed 100 each 4   No current facility-administered medications on file prior to visit.    No Known Allergies  Social History   Socioeconomic History   Marital status: Single    Spouse name: Not on file   Number of children: 0   Years of education: Not on file   Highest education level: Bachelor's degree (e.g., BA, AB, BS)  Occupational History   Not on file  Tobacco Use   Smoking status: Never   Smokeless tobacco: Never  Vaping Use   Vaping Use: Never used  Substance and Sexual Activity   Alcohol use: No   Drug use: No   Sexual activity: Not Currently  Other Topics Concern   Not on file  Social History Narrative   Right Handed   Lives in a one story   Drinks caffeine   Social Determinants of Health   Financial Resource Strain: Not on file  Food Insecurity: Not on file  Transportation Needs:  No Transportation Needs (09/18/2018)   PRAPARE - Hydrologist (Medical): No    Lack of Transportation (Non-Medical): No  Physical Activity: Inactive (09/05/2017)   Exercise Vital Sign    Days of Exercise per Week: 0 days    Minutes of Exercise per Session: 0 min  Stress: No Stress Concern Present (09/05/2017)   [place]    Feeling of Stress : Not at all  Social Connections: Somewhat Isolated (09/05/2017)   Social Connection and Isolation Panel [NHANES]    Frequency of Communication with Friends and Family: More than three times a week    Frequency of Social Gatherings with Friends and Family: Twice a week    Attends Religious Services: More than 4 times per year    Active Member of [employer] or Organizations: No    Attends Archivist Meetings: Never    Marital Status: Never married  Intimate Partner Violence: Unknown (09/05/2017)   Humiliation, Afraid, Rape, and Kick questionnaire    Fear of Current or Ex-Partner: Patient refused    Emotionally Abused: Patient refused    Physically Abused: Patient refused    Sexually Abused: Patient refused    Family History  Problem Relation Age of Onset   Stroke Mother    Hypertension Mother    Diabetes Father     Past Surgical History:  Procedure Laterality Date   IR ANGIO INTRA EXTRACRAN SEL INTERNAL CAROTID BILAT MOD SED  12/25/2016   IR ANGIO VERTEBRAL SEL VERTEBRAL UNI L MOD SED  12/25/2016   IR ANGIOGRAM FOLLOW UP STUDY  12/25/2016   IR ANGIOGRAM FOLLOW UP STUDY  12/25/2016   IR ANGIOGRAM FOLLOW UP STUDY  12/25/2016   IR ANGIOGRAM FOLLOW UP STUDY  12/25/2016   IR TRANSCATH/EMBOLIZ  12/25/2016   RADIOLOGY WITH ANESTHESIA N/A 12/25/2016   Procedure: RADIOLOGY WITH ANESTHESIA;  Surgeon: Consuella Lose, MD;  Location: [facility];  Service: Radiology;  Laterality: N/A;    ROS: Review of Systems Negative except as stated above  PHYSICAL  EXAM: BP 121/82 (BP Location: Left Arm, Patient Position: Sitting, Cuff Size: Normal)   Pulse 83   Temp 98.4 F (36.9 C) (Oral)   Ht _0  (1.626 m)   Wt 206 lb (93.4 kg)   SpO2 99%   BMI 35.36 kg/m   Wt Readings from Last 3 Encounters:  07/06/22 206 lb (93.4 kg)  03/05/22 205 lb (93 kg)  02/27/22 204 lb 6.4  oz (92.7 kg)    Physical Exam  General appearance - alert, well appearing, and in no distress Mental status - normal mood, behavior, speech, dress, motor activity, and thought processes Neck - supple, no significant adenopathy Chest - clear to auscultation, no wheezes, rales or rhonchi, symmetric air entry Heart - normal rate, regular rhythm, normal S1, S2, no murmurs, rubs, clicks or gallops Extremities - peripheral pulses normal, no pedal edema, no clubbing or cyanosis      Latest Ref Rng & Units 11/02/2021    3:03 PM 11/02/2020    9:10 AM 09/17/2019    9:32 AM  CMP  Glucose 70 - 99 mg/dL 111  106  93   BUN 6 - 24 mg/dL _0 Creatinine 0.57 - 1.00 mg/dL 0.74  0.72  0.67   Sodium 134 - 144 mmol/L 146  138  139   Potassium 3.5 - 5.2 mmol/L 4.7  4.7  5.0   Chloride 96 - 106 mmol/L 105  103  104   CO2 20 - 29 mmol/L _1 Calcium 8.7 - 10.2 mg/dL 9.9  9.5  9.4   Total Protein 6.0 - 8.5 g/dL 7.8  7.3  7.2   Total Bilirubin 0.0 - 1.2 mg/dL 0.6  0.5  0.7   Alkaline Phos 44 - 121 IU/L 99  101  79   AST 0 - 40 IU/L _2 ALT 0 - 32 IU/L _3 Lipid Panel     Component Value Date/Time   CHOL 119 11/02/2021 1503   TRIG 209 (H) 11/02/2021 1503   HDL 39 (L) 11/02/2021 1503   CHOLHDL 3.1 11/02/2021 1503   LDLCALC 46 11/02/2021 1503    CBC    Component Value Date/Time   WBC 7.6 11/02/2021 1503   WBC 6.3 02/05/2017 0443   RBC 4.95 11/02/2021 1503   RBC 4.16 02/05/2017 0443   HGB 14.2 11/02/2021 1503   HCT 41.0 11/02/2021 1503   PLT 424 11/02/2021 1503   MCV 83 11/02/2021 1503   MCH 28.7 11/02/2021 1503   MCH 27.2 02/05/2017 0443    MCHC 34.6 11/02/2021 1503   MCHC 31.1 02/05/2017 0443   RDW 13.8 11/02/2021 1503   LYMPHSABS 1.8 02/05/2017 0443   MONOABS 0.4 02/05/2017 0443   EOSABS 0.1 02/05/2017 0443   BASOSABS 0.0 02/05/2017 0443    ASSESSMENT AND [name]:  1. Type 2 diabetes mellitus with obesity (HCC) At goal.  Continue metformin, healthy eating habits and regular exercise.  Advised to be mindful of eating habits over the holidays. - POCT glucose (manual entry) - POCT glycosylated hemoglobin (Hb A1C) - metFORMIN (GLUCOPHAGE) 500 MG tablet; Take 1 tablet (500 mg total) by mouth daily with breakfast.  Dispense: 90 tablet; Refill: 3  2. Hyperlipidemia associated with type 2 diabetes mellitus ([place]) Controlled.  Continue atorvastatin - atorvastatin (LIPITOR) 10 MG tablet; Take 1 tablet (10 mg total) by mouth daily.  Dispense: 30 tablet; Refill: 6  3. Hypertension associated with type 2 diabetes mellitus ([place]) Control.  Continue lisinopril - lisinopril (ZESTRIL) 10 MG tablet; Take 1 tablet (10 mg total) by mouth daily.  Dispense: 90 tablet; Refill: 1  4. Screening for colon cancer - Fecal occult blood, imunochemical([facility])     Patient was given the opportunity to ask questions.  Patient verbalized understanding of the [name] and was able to repeat key  elements of the [name].   This documentation was completed using Radio producer.  Any transcriptional errors are unintentional.  Orders Placed This Encounter  Procedures   Fecal occult blood, imunochemical([facility])   POCT glucose (manual entry)   POCT glycosylated hemoglobin (Hb A1C)     Requested Prescriptions   Signed Prescriptions Disp Refills   atorvastatin (LIPITOR) 10 MG tablet 30 tablet 6    Sig: Take 1 tablet (10 mg total) by mouth daily.   lisinopril (ZESTRIL) 10 MG tablet 90 tablet 1    Sig: Take 1 tablet (10 mg total) by mouth daily.   metFORMIN (GLUCOPHAGE) 500 MG tablet 90 tablet 3    Sig: Take 1 tablet  (500 mg total) by mouth daily with breakfast.    Return in about 4 weeks (around 08/03/2022) for PAP.  Karle Plumber, MD, FACP

## 2022-07-22 LAB — FECAL OCCULT BLOOD, IMMUNOCHEMICAL: Fecal Occult Bld: NEGATIVE

## 2022-07-26 ENCOUNTER — Ambulatory Visit: Payer: Self-pay | Attending: Family Medicine

## 2022-08-20 ENCOUNTER — Encounter: Payer: Self-pay | Admitting: Internal Medicine

## 2022-08-20 ENCOUNTER — Other Ambulatory Visit (HOSPITAL_COMMUNITY)
Admission: RE | Admit: 2022-08-20 | Discharge: 2022-08-20 | Disposition: A | Discharge status: Home / Self Care | Payer: Self-pay | Source: Ambulatory Visit | Attending: Internal Medicine | Admitting: Internal Medicine

## 2022-08-20 ENCOUNTER — Ambulatory Visit: Payer: Self-pay | Attending: Internal Medicine | Admitting: Internal Medicine

## 2022-08-20 VITALS — BP 138/90 | HR 79 | Wt 213.4 lb

## 2022-08-20 DIAGNOSIS — Z1231 Encounter for screening mammogram for malignant neoplasm of breast: Secondary | ICD-10-CM

## 2022-08-20 DIAGNOSIS — Z124 Encounter for screening for malignant neoplasm of cervix: Secondary | ICD-10-CM

## 2022-08-20 NOTE — Progress Notes (Signed)
Patient ID: Aimee [name], female    DOB: 1975/12/09  MRN: [id number]  CC: Gynecologic Exam   Subjective: Aimee [name] is a 47 y.o. 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    GYN History:  Pt is G0P0 Any hx of abn paps?: no Menses regular or irregular?: stopped 1 yr ago How long does menses last?  Menstrual flow light or heavy?:  Method of birth control?:  NA Any vaginal dischg at this time?:  + vaginal itching Dysuria?: no Any hx of STI?:  no Sexually active with how many partners: currently not Desires STI screen:  no Last MMG: 09/2018.  Hx of fibroma RT breast.  She checks breast regularly.  No masses felt.   Family hx of uterine, cervical or breast cancer?:   no   Patient Active Problem List   Diagnosis Date Noted   Gastroesophageal reflux disease without esophagitis 10/31/2020   Fibroadenoma of right breast 12/16/2018   Insomnia 12/16/2018   Rotator cuff syndrome of right shoulder 06/26/2017   Type 2 diabetes mellitus without complication, without long-term current use of insulin ([place]) 04/11/2017   Reactive hypertension    Class 1 obesity due to excess calories with serious comorbidity and body mass index (BMI) of 30.0 to 30.9 in adult    Essential hypertension    Diabetes mellitus type 2 in obese (HCC)    Subarachnoid hemorrhage due to ruptured aneurysm ([place]) 12/25/2016     Current Outpatient Medications on File Prior to Visit  Medication Sig Dispense Refill   aspirin 81 MG chewable tablet Chew by mouth daily.     atorvastatin (LIPITOR) 10 MG tablet Take 1 tablet (10 mg total) by mouth daily. 30 tablet 6   Blood Glucose Monitoring Suppl (TRUE METRIX METER) w/Device KIT Use as directed 1 kit 0   clotrimazole-betamethasone (LOTRISONE) cream Apply 1 application topically 2 (two)  times daily. For 10 days then as needed 30 g 3   glucose blood (TRUE METRIX BLOOD GLUCOSE TEST) test strip Use to check blood sugar once daily. E11.69 100 each 0   lisinopril (ZESTRIL) 10 MG tablet Take 1 tablet (10 mg total) by mouth daily. 90 tablet 1   metFORMIN (GLUCOPHAGE) 500 MG tablet Take 1 tablet (500 mg total) by mouth daily with breakfast. 90 tablet 3   omeprazole (PRILOSEC) 20 MG capsule Take 20 mg by mouth daily.     TRUEPLUS LANCETS 28G MISC Use as directed 100 each 4   No current facility-administered medications on file prior to visit.    No Known Allergies  Social History   Socioeconomic History   Marital status: Single    Spouse name: Not on file   Number of children: 0   Years of education: Not on file   Highest education level: Bachelor's degree (e.g., BA, AB, BS)  Occupational History   Not on file  Tobacco Use   Smoking status: Never   Smokeless tobacco: Never  Vaping Use   Vaping Use: Never used  Substance and Sexual Activity   Alcohol use: No   Drug use: No   Sexual activity: Not Currently  Other Topics Concern   Not on file  Social History Narrative   Right Handed   Lives in a one story   Drinks caffeine  Social Determinants of Health   Financial Resource Strain: Not on file  Food Insecurity: Not on file  Transportation Needs: No Transportation Needs (09/18/2018)   PRAPARE - Hydrologist (Medical): No    Lack of Transportation (Non-Medical): No  Physical Activity: Inactive (09/05/2017)   Exercise Vital Sign    Days of Exercise per Week: 0 days    Minutes of Exercise per Session: 0 min  Stress: No Stress Concern Present (09/05/2017)   [place]    Feeling of Stress : Not at all  Social Connections: Somewhat Isolated (09/05/2017)   Social Connection and Isolation Panel [NHANES]    Frequency of Communication with Friends and Family: More than three  times a week    Frequency of Social Gatherings with Friends and Family: Twice a week    Attends Religious Services: More than 4 times per year    Active Member of [employer] or Organizations: No    Attends Archivist Meetings: Never    Marital Status: Never married  Intimate Partner Violence: Unknown (09/05/2017)   Humiliation, Afraid, Rape, and Kick questionnaire    Fear of Current or Ex-Partner: Patient refused    Emotionally Abused: Patient refused    Physically Abused: Patient refused    Sexually Abused: Patient refused    Family History  Problem Relation Age of Onset   Stroke Mother    Hypertension Mother    Diabetes Father     Past Surgical History:  Procedure Laterality Date   IR ANGIO INTRA EXTRACRAN SEL INTERNAL CAROTID BILAT MOD SED  12/25/2016   IR ANGIO VERTEBRAL SEL VERTEBRAL UNI L MOD SED  12/25/2016   IR ANGIOGRAM FOLLOW UP STUDY  12/25/2016   IR ANGIOGRAM FOLLOW UP STUDY  12/25/2016   IR ANGIOGRAM FOLLOW UP STUDY  12/25/2016   IR ANGIOGRAM FOLLOW UP STUDY  12/25/2016   IR TRANSCATH/EMBOLIZ  12/25/2016   RADIOLOGY WITH ANESTHESIA N/A 12/25/2016   Procedure: RADIOLOGY WITH ANESTHESIA;  Surgeon: Consuella Lose, MD;  Location: [facility];  Service: Radiology;  Laterality: N/A;    ROS: Review of Systems Negative except as stated above  PHYSICAL EXAM: BP (!) 138/90   Pulse 79   Wt 213 lb 6.4 oz (96.8 kg)   SpO2 100%   BMI 36.63 kg/m   Physical Exam  General appearance - alert, well appearing, and in no distress Mental status - normal mood, behavior, speech, dress, motor activity, and thought processes Breasts - CMA Dorena Dew present:  breasts appear normal, no suspicious masses, no skin or nipple changes or axillary nodes Pelvic - normal external genitalia, vulva, vagina, cervix, uterus and adnexa      Latest Ref Rng & Units 11/02/2021    3:03 PM 11/02/2020    9:10 AM 09/17/2019    9:32 AM  CMP  Glucose 70 - 99 mg/dL 111  106  93   BUN 6 - 24 mg/dL  '18  17  13   '$ Creatinine 0.57 - 1.00 mg/dL 0.74  0.72  0.67   Sodium 134 - 144 mmol/L 146  138  139   Potassium 3.5 - 5.2 mmol/L 4.7  4.7  5.0   Chloride 96 - 106 mmol/L 105  103  104   CO2 20 - 29 mmol/L '23  21  19   '$ Calcium 8.7 - 10.2 mg/dL 9.9  9.5  9.4   Total Protein 6.0 - 8.5 g/dL 7.8  7.3  7.2   Total Bilirubin 0.0 - 1.2 mg/dL 0.6  0.5  0.7   Alkaline Phos 44 - 121 IU/L 99  101  79   AST 0 - 40 IU/L '20  21  14   '$ ALT 0 - 32 IU/L '26  18  13    '$ Lipid Panel     Component Value Date/Time   CHOL 119 11/02/2021 1503   TRIG 209 (H) 11/02/2021 1503   HDL 39 (L) 11/02/2021 1503   CHOLHDL 3.1 11/02/2021 1503   LDLCALC 46 11/02/2021 1503    CBC    Component Value Date/Time   WBC 7.6 11/02/2021 1503   WBC 6.3 02/05/2017 0443   RBC 4.95 11/02/2021 1503   RBC 4.16 02/05/2017 0443   HGB 14.2 11/02/2021 1503   HCT 41.0 11/02/2021 1503   PLT 424 11/02/2021 1503   MCV 83 11/02/2021 1503   MCH 28.7 11/02/2021 1503   MCH 27.2 02/05/2017 0443   MCHC 34.6 11/02/2021 1503   MCHC 31.1 02/05/2017 0443   RDW 13.8 11/02/2021 1503   LYMPHSABS 1.8 02/05/2017 0443   MONOABS 0.4 02/05/2017 0443   EOSABS 0.1 02/05/2017 0443   BASOSABS 0.0 02/05/2017 0443    ASSESSMENT AND [name]: 1. Pap smear for cervical cancer screening - Cytology - PAP([place])  2. Encounter for screening mammogram for malignant neoplasm of breast Patient given mammogram scholarship so that she can get her mammogram. - MM Digital Screening; Future     Patient was given the opportunity to ask questions.  Patient verbalized understanding of the [name] and was able to repeat key elements of the [name].   This documentation was completed using Radio producer.  Any transcriptional errors are unintentional.  Orders Placed This Encounter  Procedures   MM Digital Screening     Requested Prescriptions    No prescriptions requested or ordered in this encounter    Return in about 4 months  (around 12/19/2022).  Karle Plumber, MD, FACP

## 2022-08-21 LAB — CYTOLOGY - PAP
Comment: NEGATIVE
Diagnosis: NEGATIVE
High risk HPV: NEGATIVE

## 2022-08-29 NOTE — Progress Notes (Signed)
NEUROLOGY FOLLOW UP OFFICE NOTE  Aimee [name] [id number]  Assessment/Plan:   1. Right sided posttraumatic headache - doing very well off nortriptyline. 2.  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  Follow up as needed.   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   UPDATE: Tapered off nortriptyline to see how she does.  She is feeling well  Intensity:  moderate  Duration:  Couple of hours Frequency: 3 or 4 in last  6 months   Current NSAIDS/analgesics:  Tylenol '500mg'$  Current triptans:  none Current ergotamine:  none Current anti-emetic:  none Current muscle relaxants:  none Current Antihypertensive medications:  Lisinopril  Current Antidepressant medications:  nortriptyline '25mg'$  QHS Current Anticonvulsant medications:  none Current anti-CGRP:  none Current Vitamins/Herbal/Supplements:  none Current Antihistamines/Decongestants:  none Other therapy:  Cold compress on head and back of neck Hormone/birth control:  none Other medications:  Atorvastatin, metformin   Caffeine:  2 cups of coffee daily, sometimes Coke Zero Diet:  2 L of water daily. Does not skip meals. Exercise:  Tries to walk 30 minutes daily. Depression:  none; Anxiety:  some Other pain:  none Sleep hygiene:  Good.   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  Underwent coiling.  At the time, she was placed on Keppra for seizure prophylaxis for short time.  She describes a severe pressure pain right left anterior parietal region.  They are associated with blurred vision but no photophobia, phonophobia, nausea, vomiting, dizziness, or speech disturbance.  They typically last 3  hours and occur 1 to 3 times a week.  They are triggered by stress and relieved if she rests in bed.  When severe, she takes Tylenol '500mg'$  and has relief within 1 hour.  She takes Tylenol infrequently.     Past NSAIDS/analgesics:  none Past abortive triptans:  none Past abortive ergotamine:  none Past muscle relaxants:  none Past anti-emetic:  none Past antihypertensive medications:  none Past antidepressant medications:  none Past anticonvulsant medications:  Keppra Past anti-CGRP:  none Past vitamins/Herbal/Supplements:  none Past antihistamines/decongestants:  none Other past therapies:  none     Family history of headache:  No.     Imaging personally reviewed: 12/25/2016 CT HEAD:  Diffuse subarachnoid hemorrhage, filling CSF spaces in the suprasellar and basal cisterns as well as bilateral sulci. Cause is not determined but presents high suspicion for ruptured intracranial aneurysm. 12/25/2016 CTA HEAD & NECK:  1. 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  Aneurysm is directed posteriorly, and demonstrates a fairly narrow neck. Neuro endovascular consultation is recommended.  2. Diffuse irregularity throughout the remaining intracranial arterial vasculature, suspicious for vasospasm secondary to acute subarachnoid hemorrhage.  Normal CTA of neck. 12/26/2016 MRI BRAIN WO: 1. 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 More confluent acute infarcts in the high frontoparietal regions bilaterally.  2. Subarachnoid and small volume intraventricular hemorrhage.  12/30/2016 MRI BRAIN WO:  1. Overall similar appearance of scattered bilateral embolic infarcts and confluent cortical infarcts at the vertex bilaterally.  2. No new infarcts.  3. Decreasing subarachnoid and intraventricular hemorrhage. 01/30/2017 CT  HEAD WO:  1. 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  No new/acute intracranial findings. 2. Right-sided  vascular cord rolls with significant artifact but no complicating features.  3. Persistent/chronic left maxillary sinus disease. 4. Hyperostosis frontalis interna 06/13/2020 CTA HEAD:  1. Prominent streak artifact arising from an embolization coil mass at site of a treated right posterior communicating artery aneurysm. This limits evaluation of the paraclinoid right ICA and proximal posterior cerebral arteries. This also significantly limits evaluation for recurrent aneurysm filling. 2. No evidence of recurrent filling of the treated aneurysm. 3. No intracranial large vessel occlusion or proximal high-grade arterial stenosis. 4. No new intracranial aneurysm is identified elsewhere.  PAST MEDICAL HISTORY: Past Medical History:  Diagnosis Date   Diabetes mellitus without complication ([place])    Hypertension    Stroke [facility])    Dec 25 2016    MEDICATIONS: Current Outpatient Medications on File Prior to Visit  Medication Sig Dispense Refill   aspirin 81 MG chewable tablet Chew by mouth daily.     atorvastatin (LIPITOR) 10 MG tablet Take 1 tablet (10 mg total) by mouth daily. 30 tablet 6   Blood Glucose Monitoring Suppl (TRUE METRIX METER) w/Device KIT Use as directed 1 kit 0   clotrimazole-betamethasone (LOTRISONE) cream Apply 1 application topically 2 (two) times daily. For 10 days then as needed 30 g 3   glucose blood (TRUE METRIX BLOOD GLUCOSE TEST) test strip Use to check blood sugar once daily. E11.69 100 each 0   lisinopril (ZESTRIL) 10 MG tablet Take 1 tablet (10 mg total) by mouth daily. 90 tablet 1   metFORMIN (GLUCOPHAGE) 500 MG tablet Take 1 tablet (500 mg total) by mouth daily with breakfast. 90 tablet 3   nortriptyline (PAMELOR) 25 MG capsule Take 1 capsule (25 mg total) by mouth at bedtime. 30 capsule 8   omeprazole (PRILOSEC) 20 MG capsule Take 20 mg by mouth daily.     TRUEPLUS LANCETS 28G MISC Use as directed 100 each 4   No current facility-administered medications on file prior  to visit.    ALLERGIES: No Known Allergies  FAMILY HISTORY: Family History  Problem Relation Age of Onset   Stroke Mother    Hypertension Mother    Diabetes Father       Objective:  Blood pressure 122/81, pulse 85, height '5\' 4"'$  (1.626 m), weight 213 lb (96.6 kg), SpO2 98 %. General: No acute distress.  Patient appears well-groomed.   Head:  Normocephalic/atraumatic Eyes:  Fundi examined but not visualized Neck: supple, no paraspinal tenderness, full range of motion Heart:  Regular rate and rhythm Neurological Exam: alert and oriented to person, place, and time.  Speech fluent and not dysarthric, language intact.  CN II-XII intact. Bulk and tone normal, muscle strength 5/5 throughout.  Sensation to light touch intact.  Deep tendon reflexes 2+ throughout, toes downgoing.  Finger to nose testing intact.  Gait normal, Romberg negative.    Metta Clines, DO  CC: Karle Plumber, MD

## 2022-08-31 ENCOUNTER — Ambulatory Visit (INDEPENDENT_AMBULATORY_CARE_PROVIDER_SITE_OTHER): Payer: Self-pay | Admitting: Neurology

## 2022-08-31 ENCOUNTER — Encounter: Payer: Self-pay | Admitting: Neurology

## 2022-08-31 VITALS — BP 122/81 | HR 85 | Ht 64.0 in | Wt 213.0 lb

## 2022-08-31 DIAGNOSIS — G44309 Post-traumatic headache, unspecified, not intractable: Secondary | ICD-10-CM

## 2022-08-31 DIAGNOSIS — I608 Other nontraumatic subarachnoid hemorrhage: Secondary | ICD-10-CM

## 2022-09-07 ENCOUNTER — Telehealth: Payer: Self-pay

## 2022-09-07 NOTE — Telephone Encounter (Signed)
Telephoned patient using language line interpreter#[id number]. Voice mail not set up, [facility] unable to leave a message with scheduling information.

## 2022-09-27 ENCOUNTER — Inpatient Hospital Stay: Admission: RE | Admit: 2022-09-27 | Payer: Self-pay | Source: Ambulatory Visit

## 2022-09-27 ENCOUNTER — Other Ambulatory Visit: Payer: Self-pay

## 2022-09-27 DIAGNOSIS — Z87898 Personal history of other specified conditions: Secondary | ICD-10-CM

## 2022-10-11 ENCOUNTER — Ambulatory Visit: Payer: Self-pay

## 2022-10-11 ENCOUNTER — Ambulatory Visit
Admission: RE | Admit: 2022-10-11 | Discharge: 2022-10-11 | Disposition: A | Discharge status: Home / Self Care | Payer: No Typology Code available for payment source | Source: Ambulatory Visit | Attending: Obstetrics and Gynecology | Admitting: Obstetrics and Gynecology

## 2022-10-11 ENCOUNTER — Ambulatory Visit: Payer: Self-pay | Admitting: Hematology and Oncology

## 2022-10-11 VITALS — BP 132/82 | Wt 212.0 lb

## 2022-10-11 DIAGNOSIS — Z87898 Personal history of other specified conditions: Secondary | ICD-10-CM

## 2022-10-11 NOTE — Progress Notes (Signed)
Aimee [name] is a 47 y.o. female who presents to [facility] clinic today with no complaints. Follow up probable fibroadenoma in right breast.   Pap Smear: Pap not smear completed today. Last Pap smear was 2024 and was normal. Per patient has no history of an abnormal Pap smear. Last Pap smear result is available in Epic.   Physical exam: Breasts Breasts symmetrical. No skin abnormalities bilateral breasts. No nipple retraction bilateral breasts. No nipple discharge bilateral breasts. No lymphadenopathy. No lumps palpated bilateral breasts.  MS DIGITAL DIAG TOMO BILAT  Result Date: 09/18/2018 CLINICAL DATA:  46 year old female for 1 year follow-up of RIGHT breast mass and for annual bilateral mammogram EXAM: DIGITAL DIAGNOSTIC BILATERAL MAMMOGRAM WITH CAD AND TOMO ULTRASOUND RIGHT BREAST COMPARISON:  Previous exam(s). ACR Breast Density Category b: There are scattered areas of fibroglandular density. FINDINGS: 2D/3D full field views of both breast demonstrate a stable circumscribed oval mass within the UPPER-OUTER RIGHT breast. No new mammographic abnormalities are noted within either breast. Mammographic images were processed with CAD. Targeted ultrasound is performed, showing a stable 1.3 x 0.5 x 1 cm circumscribed oval hypoechoic parallel mass at the 10 o'clock position of the RIGHT breast 5 cm from the nipple, likely a fibroadenoma. IMPRESSION: 1. Stable probable fibroadenoma within the UPPER-OUTER RIGHT breast. One additional follow-up in 1 year recommended to ensure 2 year stability. 2. No mammographic evidence of breast malignancy otherwise. RECOMMENDATION: Bilateral diagnostic mammogram and RIGHT breast ultrasound in 1 year. I have discussed the findings and recommendations with the patient. Results were also provided in writing at the conclusion of the visit. If applicable, a reminder letter will be sent to the patient regarding the next appointment. BI-RADS CATEGORY  3: Probably benign.  Electronically Signed   By: Margarette Canada M.D.   On: 09/18/2018 11:27   MM DIAG BREAST TOMO UNI RIGHT  Result Date: 03/06/2018 CLINICAL DATA:  Six-month interval follow-up of likely benign RIGHT breast masses initially identified on initial baseline screening mammography. EXAM: DIGITAL DIAGNOSTIC RIGHT MAMMOGRAM WITH CAD AND TOMO ULTRASOUND RIGHT BREAST COMPARISON:  Mammography 09/05/2017 (RIGHT), 08/09/2017 (BILATERAL, baseline). RIGHT breast ultrasound 09/05/2017. ACR Breast Density Category b: There are scattered areas of fibroglandular density. FINDINGS: Tomosynthesis and synthesized full field CC and MLO views of the RIGHT breast were obtained. The circumscribed low-density mass in the UPPER OUTER RIGHT breast at MIDDLE depth is unchanged in size and appearance, measuring just over 1 cm. The circumscribed mass in the INNER subareolar location is less conspicuous on today's examination. No new or suspicious findings elsewhere in the RIGHT breast. Mammographic images were processed with CAD. Targeted RIGHT breast ultrasound is performed, showing the previously identified circumscribed oval parallel hypoechoic mass at the 10 o'clock position approximately 5 cm from the nipple at POSTERIOR depth, measuring approximately 0.6 x 1.4 x 1.0 cm (previously 0 5 x 1.3 x 1 3 cm), demonstrating mixed posterior characteristics and no internal power Doppler flow, unchanged from the prior ultrasound. The previously identified hypoechoic mass at the 3 o'clock subareolar location is not visible currently. No new suspicious solid mass or abnormal acoustic shadowing is identified. IMPRESSION: 1. Stable likely benign fibroadenoma involving the UPPER OUTER QUADRANT of the RIGHT breast at the 10 o'clock position approximately 5 cm from the nipple at POSTERIOR depth. 2. Interval resolution of the previously identified cyst at the 3 o'clock subareolar location. RECOMMENDATION: RIGHT breast ultrasound at the time of annual BILATERAL  diagnostic mammography in 6 months. I have discussed  the findings and recommendations with the patient. Results were also provided in writing at the conclusion of the visit. If applicable, a reminder letter will be sent to the patient regarding the next appointment. BI-RADS CATEGORY  3: Probably benign. Electronically Signed   By: Evangeline Dakin M.D.   On: 03/06/2018 12:00         Pelvic/Bimanual Pap is not indicated today    Smoking History: Patient has never smoked and was not referred to quit line.    Patient Navigation: Patient education provided. Access to services provided for patient through [place] interpreter provided. No transportation provided   Colorectal Cancer Screening: Per patient has never had colonoscopy completed No complaints today.    Breast and Cervical Cancer Risk Assessment: Patient does not have family history of breast cancer, known genetic mutations, or radiation treatment to the chest before age 5. Patient does not have history of cervical dysplasia, immunocompromised, or DES exposure in-utero.  Risk Assessment   No risk assessment data for the current encounter  Risk Scores       09/18/2018   Last edited by: Aimee Parish, RN   5-year risk: 0.5 %   Lifetime risk: 8.5 %            A: [facility] exam without pap smear No complaints with benign exam. Follow up probable fibroadenoma right breast.   P: Referred patient to the [facility] for a diagnostic mammogram. Appointment scheduled 10/11/22.  Aimee Ped, NP 10/11/2022 9:57 AM

## 2022-10-11 NOTE — Patient Instructions (Signed)
Taught Ma. Aimee [name] about self breast awareness and gave educational materials to take home. Patient did not need a Pap smear today due to last Pap smear was in 2024 per patient. Let her know [facility] will cover Pap smears every 5 years unless has a history of abnormal Pap smears. Referred patient to the [facility] for diagnostic mammogram. Appointment scheduled for 10/11/22. Patient aware of appointment and will be there. Let patient know will follow up with her within the next couple weeks with results. Ma. Aimee [name] verbalized understanding.  [name], NP 9:59 AM

## 2022-11-05 ENCOUNTER — Ambulatory Visit: Payer: Self-pay | Admitting: Internal Medicine

## 2022-12-20 ENCOUNTER — Encounter: Payer: Self-pay | Admitting: Internal Medicine

## 2022-12-20 ENCOUNTER — Ambulatory Visit: Payer: Self-pay | Attending: Internal Medicine | Admitting: Internal Medicine

## 2022-12-20 VITALS — BP 119/82 | HR 85 | Ht 64.0 in | Wt 210.0 lb

## 2022-12-20 DIAGNOSIS — E1169 Type 2 diabetes mellitus with other specified complication: Secondary | ICD-10-CM

## 2022-12-20 DIAGNOSIS — E785 Hyperlipidemia, unspecified: Secondary | ICD-10-CM

## 2022-12-20 DIAGNOSIS — E1159 Type 2 diabetes mellitus with other circulatory complications: Secondary | ICD-10-CM

## 2022-12-20 DIAGNOSIS — I152 Hypertension secondary to endocrine disorders: Secondary | ICD-10-CM

## 2022-12-20 DIAGNOSIS — G5690 Unspecified mononeuropathy of unspecified upper limb: Secondary | ICD-10-CM

## 2022-12-20 DIAGNOSIS — E66812 Obesity, class 2: Secondary | ICD-10-CM

## 2022-12-20 DIAGNOSIS — Z7984 Long term (current) use of oral hypoglycemic drugs: Secondary | ICD-10-CM

## 2022-12-20 DIAGNOSIS — Z6836 Body mass index (BMI) 36.0-36.9, adult: Secondary | ICD-10-CM

## 2022-12-20 LAB — POCT GLYCOSYLATED HEMOGLOBIN (HGB A1C): HbA1c, POC (controlled diabetic range): 6.2 % (ref 0.0–7.0)

## 2022-12-20 NOTE — Progress Notes (Signed)
Patient ID: Aimee [name], female    DOB: Jul 13, 1976  MRN: [id number]  CC: Diabetes   Subjective: Aimee [name] is a 47 y.o. female who presents for chronic disease management. 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    DM/Obesity: Last A1c was 6. Checks BS every morning.  Has log.  Range 110-138 Reports compliance with taking metformin 500 mg daily. Doing okay with her eating habits. Eating more veggies, not much white carbs; drinks water and coffee without sugar Exercise: walks 30 mins daily She has been having some increased sensitivity to hot and cold in both hands.  She started taking vitamin B12 over-the-counter for this.  HTN:  In regards to blood pressure, she is taking the lisinopril 10 mg daily as prescribed.  She limits salt in the foods.  No CP/SOB Checks BP daily.  Has log.  Recent readings: 120/76, 117/76, 112/72, 112/68  Reports compliance with atorvastatin 10 mg daily. Also takes Fish Oil one caps a day.  Last LDL was 46. Patient Active Problem List   Diagnosis Date Noted   Class 2 severe obesity due to excess calories with serious comorbidity and body mass index (BMI) of 35.0 to 35.9 in adult [facility]) 12/20/2022   Gastroesophageal reflux disease without esophagitis 10/31/2020   Fibroadenoma of right breast 12/16/2018   Insomnia 12/16/2018   Rotator cuff syndrome of right shoulder 06/26/2017   Type 2 diabetes mellitus without complication, without long-term current use of insulin (HCC) 04/11/2017   Reactive hypertension    Class 1 obesity due to excess calories with serious comorbidity and body mass index (BMI) of 30.0 to 30.9 in adult    Essential hypertension    Diabetes mellitus type 2 in obese    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     atorvastatin (LIPITOR) 10 MG tablet Take 1 tablet (10 mg total) by mouth daily. 30 tablet 6   Blood Glucose Monitoring Suppl (TRUE METRIX METER) w/Device KIT Use as directed 1 kit 0   clotrimazole-betamethasone (LOTRISONE) cream Apply 1 application topically 2 (two) times daily. For 10 days then as needed 30 g 3   glucose blood (TRUE METRIX BLOOD GLUCOSE TEST) test strip Use to check blood sugar once daily. E11.69 100 each 0   lisinopril (ZESTRIL) 10 MG tablet Take 1 tablet (10 mg total) by mouth daily. 90 tablet 1   metFORMIN (GLUCOPHAGE) 500 MG tablet Take 1 tablet (500 mg total) by mouth daily with breakfast. 90 tablet 3   omeprazole (PRILOSEC) 20 MG capsule Take 20 mg by mouth daily.     TRUEPLUS LANCETS 28G MISC Use as directed 100 each 4   No current facility-administered medications on file prior to visit.    No Known Allergies  Social History   Socioeconomic History   Marital status: Single    Spouse name: Not on file   Number of children: 0   Years of education: Not on file   Highest education level: Bachelor's degree (e.g., BA, AB, BS)  Occupational History   Not on file  Tobacco Use   Smoking status: Never   Smokeless tobacco: Never  Vaping  Use   Vaping Use: Never used  Substance and Sexual Activity   Alcohol use: No   Drug use: No   Sexual activity: Not Currently  Other Topics Concern   Not on file  Social History Narrative   Right Handed   Lives in a one story   Drinks caffeine   Social Determinants of Health   Financial Resource Strain: Not on file  Food Insecurity: No Food Insecurity (10/11/2022)   Hunger Vital Sign    Worried About Running Out of Food in the Last Year: Never true    Ran Out of Food in the Last Year: Never true  Transportation Needs: No Transportation Needs (10/11/2022)   PRAPARE - Administrator, Civil Service  (Medical): No    Lack of Transportation (Non-Medical): No  Physical Activity: Inactive (09/05/2017)   Exercise Vital Sign    Days of Exercise per Week: 0 days    Minutes of Exercise per Session: 0 min  Stress: No Stress Concern Present (09/05/2017)   [employer] of Occupational Health - Occupational Stress Questionnaire    Feeling of Stress : Not at all  Social Connections: Somewhat Isolated (09/05/2017)   Social Connection and Isolation Panel [NHANES]    Frequency of Communication with Friends and Family: More than three times a week    Frequency of Social Gatherings with Friends and Family: Twice a week    Attends Religious Services: More than 4 times per year    Active Member of [employer] or Organizations: No    Attends Banker Meetings: Never    Marital Status: Never married  Intimate Partner Violence: Unknown (09/05/2017)   Humiliation, Afraid, Rape, and Kick questionnaire    Fear of Current or Ex-Partner: Patient declined    Emotionally Abused: Patient declined    Physically Abused: Patient declined    Sexually Abused: Patient declined    Family History  Problem Relation Age of Onset   Stroke Mother    Hypertension Mother    Diabetes Father     Past Surgical History:  Procedure Laterality Date   IR ANGIO INTRA EXTRACRAN SEL INTERNAL CAROTID BILAT MOD SED  12/25/2016   IR ANGIO VERTEBRAL SEL VERTEBRAL UNI L MOD SED  12/25/2016   IR ANGIOGRAM FOLLOW UP STUDY  12/25/2016   IR ANGIOGRAM FOLLOW UP STUDY  12/25/2016   IR ANGIOGRAM FOLLOW UP STUDY  12/25/2016   IR ANGIOGRAM FOLLOW UP STUDY  12/25/2016   IR TRANSCATH/EMBOLIZ  12/25/2016   RADIOLOGY WITH ANESTHESIA N/A 12/25/2016   Procedure: RADIOLOGY WITH ANESTHESIA;  Surgeon: Lisbeth Renshaw, MD;  Location: [facility] OR;  Service: Radiology;  Laterality: N/A;    ROS: Review of Systems Negative except as stated above  PHYSICAL EXAM: BP 119/82   Pulse 85   Ht 5\' 4"  (1.626 m)   Wt 210 lb (95.3 kg)   LMP 02/19/2022    SpO2 98%   BMI 36.05 kg/m   Wt Readings from Last 3 Encounters:  12/20/22 210 lb (95.3 kg)  10/11/22 212 lb (96.2 kg)  08/31/22 213 lb (96.6 kg)    Physical Exam  General appearance - alert, well appearing, and in no distress Mental status - normal mood, behavior, speech, dress, motor activity, and thought processes Chest - clear to auscultation, no wheezes, rales or rhonchi, symmetric air entry Heart - normal rate, regular rhythm, normal S1, S2, no murmurs, rubs, clicks or gallops Extremities - peripheral pulses normal, no pedal edema, no clubbing or  cyanosis Diabetic Foot Exam - Simple   Simple Foot Form Diabetic Foot exam was performed with the following findings: Yes 12/20/2022  1:54 PM  Visual Inspection See comments: Yes Sensation Testing Intact to touch and monofilament testing bilaterally: Yes Pulse Check Posterior Tibialis and Dorsalis pulse intact bilaterally: Yes Comments Callous medial aspect both big toes.         Latest Ref Rng & Units 11/02/2021    3:03 PM 11/02/2020    9:10 AM 09/17/2019    9:32 AM  CMP  Glucose 70 - 99 mg/dL 409  811  93   BUN 6 - 24 mg/dL 18  17  13    Creatinine 0.57 - 1.00 mg/dL 9.14  7.82  9.56   Sodium 134 - 144 mmol/L 146  138  139   Potassium 3.5 - 5.2 mmol/L 4.7  4.7  5.0   Chloride 96 - 106 mmol/L 105  103  104   CO2 20 - 29 mmol/L 23  21  19    Calcium 8.7 - 10.2 mg/dL 9.9  9.5  9.4   Total Protein 6.0 - 8.5 g/dL 7.8  7.3  7.2   Total Bilirubin 0.0 - 1.2 mg/dL 0.6  0.5  0.7   Alkaline Phos 44 - 121 IU/L 99  101  79   AST 0 - 40 IU/L 20  21  14    ALT 0 - 32 IU/L 26  18  13     Lipid Panel     Component Value Date/Time   CHOL 119 11/02/2021 1503   TRIG 209 (H) 11/02/2021 1503   HDL 39 (L) 11/02/2021 1503   CHOLHDL 3.1 11/02/2021 1503   LDLCALC 46 11/02/2021 1503    CBC    Component Value Date/Time   WBC 7.6 11/02/2021 1503   WBC 6.3 02/05/2017 0443   RBC 4.95 11/02/2021 1503   RBC 4.16 02/05/2017 0443   HGB 14.2  11/02/2021 1503   HCT 41.0 11/02/2021 1503   PLT 424 11/02/2021 1503   MCV 83 11/02/2021 1503   MCH 28.7 11/02/2021 1503   MCH 27.2 02/05/2017 0443   MCHC 34.6 11/02/2021 1503   MCHC 31.1 02/05/2017 0443   RDW 13.8 11/02/2021 1503   LYMPHSABS 1.8 02/05/2017 0443   MONOABS 0.4 02/05/2017 0443   EOSABS 0.1 02/05/2017 0443   BASOSABS 0.0 02/05/2017 0443    ASSESSMENT AND PLAN: 1. Type 2 diabetes mellitus with other circulatory complication, without long-term current use of insulin (HCC) Blood sugar readings are at goal.  She will continue metformin 500 mg daily.  Encouraged her to continue healthy eating habits and regular exercise. - POCT glycosylated hemoglobin (Hb A1C) - CBC - Comprehensive metabolic panel - Microalbumin / creatinine urine ratio - Hemoglobin A1c  2. Hypertension associated with type 2 diabetes mellitus (HCC) At goal.  Continue lisinopril 10 mg daily.  3. Hyperlipidemia associated with type 2 diabetes mellitus (HCC) Last LDL was at goal.  Continue atorvastatin 10 mg daily - Lipid panel  4. Class 2 severe obesity due to excess calories with serious comorbidity and body mass index (BMI) of 36.0 to 36.9 in adult [facility]) See #1 above  5. Neuropathy of hand, unspecified laterality - Vitamin B12    Patient was given the opportunity to ask questions.  Patient verbalized understanding of the plan and was able to repeat key elements of the plan.   This documentation was completed using Paediatric nurse.  Any transcriptional errors are unintentional.  Orders Placed This Encounter  Procedures   CBC   Comprehensive metabolic panel   Lipid panel   Microalbumin / creatinine urine ratio   Hemoglobin A1c   Vitamin B12   POCT glycosylated hemoglobin (Hb A1C)     Requested Prescriptions    No prescriptions requested or ordered in this encounter    Return in about 4 months (around 04/22/2023).  Jonah Blue, MD, FACP

## 2022-12-21 ENCOUNTER — Other Ambulatory Visit: Payer: Self-pay | Admitting: Internal Medicine

## 2022-12-21 DIAGNOSIS — E1169 Type 2 diabetes mellitus with other specified complication: Secondary | ICD-10-CM

## 2022-12-21 DIAGNOSIS — I152 Hypertension secondary to endocrine disorders: Secondary | ICD-10-CM

## 2022-12-21 LAB — CBC
Hematocrit: 41.3 % (ref 34.0–46.6)
Hemoglobin: 13.7 g/dL (ref 11.1–15.9)
MCH: 28.2 pg (ref 26.6–33.0)
MCHC: 33.2 g/dL (ref 31.5–35.7)
MCV: 85 fL (ref 79–97)
Platelets: 341 10*3/uL (ref 150–450)
RBC: 4.86 x10E6/uL (ref 3.77–5.28)
RDW: 14 % (ref 11.7–15.4)
WBC: 7.6 10*3/uL (ref 3.4–10.8)

## 2022-12-21 LAB — HEMOGLOBIN A1C
Est. average glucose Bld gHb Est-mCnc: 146 mg/dL
Hgb A1c MFr Bld: 6.7 % — ABNORMAL HIGH (ref 4.8–5.6)

## 2022-12-21 LAB — COMPREHENSIVE METABOLIC PANEL WITH GFR
ALT: 19 IU/L (ref 0–32)
AST: 17 IU/L (ref 0–40)
Albumin/Globulin Ratio: 1.7 (ref 1.2–2.2)
Albumin: 4.5 g/dL (ref 3.9–4.9)
Alkaline Phosphatase: 95 IU/L (ref 44–121)
BUN/Creatinine Ratio: 24 — ABNORMAL HIGH (ref 9–23)
BUN: 18 mg/dL (ref 6–24)
Bilirubin Total: 0.6 mg/dL (ref 0.0–1.2)
CO2: 21 mmol/L (ref 20–29)
Calcium: 9.3 mg/dL (ref 8.7–10.2)
Chloride: 105 mmol/L (ref 96–106)
Creatinine, Ser: 0.76 mg/dL (ref 0.57–1.00)
Globulin, Total: 2.6 g/dL (ref 1.5–4.5)
Glucose: 102 mg/dL — ABNORMAL HIGH (ref 70–99)
Potassium: 4.4 mmol/L (ref 3.5–5.2)
Sodium: 142 mmol/L (ref 134–144)
Total Protein: 7.1 g/dL (ref 6.0–8.5)
eGFR: 97 mL/min/{1.73_m2} (ref >59)

## 2022-12-21 LAB — MICROALBUMIN / CREATININE URINE RATIO
Creatinine, Urine: 112.7 mg/dL
Microalb/Creat Ratio: 5 mg/g{creat} (ref 0–29)
Microalbumin, Urine: 5.4 ug/mL

## 2022-12-21 LAB — LIPID PANEL
Chol/HDL Ratio: 2.9 ratio (ref 0.0–4.4)
Cholesterol, Total: 112 mg/dL (ref 100–199)
HDL: 38 mg/dL — ABNORMAL LOW (ref >39)
LDL Chol Calc (NIH): 43 mg/dL (ref 0–99)
Triglycerides: 194 mg/dL — ABNORMAL HIGH (ref 0–149)
VLDL Cholesterol Cal: 31 mg/dL (ref 5–40)

## 2022-12-21 LAB — VITAMIN B12: Vitamin B-12: 1046 pg/mL (ref 232–1245)

## 2022-12-21 MED ORDER — ATORVASTATIN CALCIUM 10 MG PO TABS
10.0000 mg | ORAL_TABLET | Freq: Every day | ORAL | 6 refills | Status: DC
Start: 2022-12-21 — End: 2023-08-13

## 2022-12-21 MED ORDER — LISINOPRIL 10 MG PO TABS: 10.0000 mg | ORAL_TABLET | Freq: Every day | ORAL | 1 refills | Status: DC

## 2023-04-22 ENCOUNTER — Encounter: Payer: Self-pay | Admitting: Internal Medicine

## 2023-04-22 ENCOUNTER — Other Ambulatory Visit: Payer: Self-pay

## 2023-04-22 ENCOUNTER — Ambulatory Visit: Payer: Self-pay | Attending: Internal Medicine | Admitting: Internal Medicine

## 2023-04-22 VITALS — BP 113/74 | HR 86 | Temp 98.7°F | Ht 64.0 in | Wt 209.0 lb

## 2023-04-22 DIAGNOSIS — E1169 Type 2 diabetes mellitus with other specified complication: Secondary | ICD-10-CM

## 2023-04-22 DIAGNOSIS — E669 Obesity, unspecified: Secondary | ICD-10-CM

## 2023-04-22 DIAGNOSIS — E1159 Type 2 diabetes mellitus with other circulatory complications: Secondary | ICD-10-CM

## 2023-04-22 DIAGNOSIS — I152 Hypertension secondary to endocrine disorders: Secondary | ICD-10-CM

## 2023-04-22 DIAGNOSIS — Z7984 Long term (current) use of oral hypoglycemic drugs: Secondary | ICD-10-CM

## 2023-04-22 DIAGNOSIS — L659 Nonscarring hair loss, unspecified: Secondary | ICD-10-CM

## 2023-04-22 DIAGNOSIS — Z23 Encounter for immunization: Secondary | ICD-10-CM

## 2023-04-22 DIAGNOSIS — E785 Hyperlipidemia, unspecified: Secondary | ICD-10-CM

## 2023-04-22 LAB — GLUCOSE, POCT (MANUAL RESULT ENTRY): POC Glucose: 95 mg/dL (ref 70–99)

## 2023-04-22 LAB — POCT GLYCOSYLATED HEMOGLOBIN (HGB A1C): HbA1c, POC (controlled diabetic range): 6.4 % (ref 0.0–7.0)

## 2023-04-22 MED ORDER — LISINOPRIL 10 MG PO TABS
10.0000 mg | ORAL_TABLET | Freq: Every day | ORAL | 1 refills | Status: DC
Start: 2023-04-22 — End: 2023-08-22

## 2023-04-22 MED ORDER — METFORMIN HCL 500 MG PO TABS: 500.0000 mg | ORAL_TABLET | Freq: Every day | ORAL | 1 refills | Status: DC

## 2023-04-22 NOTE — Progress Notes (Signed)
Patient ID: Aimee [name], female    DOB: [birth year]  MRN: [id number]  CC: Diabetes (DM & HTN f/u. Med refill. /No questions / concerns/Yes to flu vax)   Subjective: Aimee [name] is a 47 y.o. female who presents for chronic ds management. 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    HTN: Taking the lisinopril 10 mg daily as prescribed.  She limits salt in the foods.  No CP/SOB   DM/Obesity: Results for orders placed or performed in visit on 04/22/23  POCT glycosylated hemoglobin (Hb A1C)  Result Value Ref Range   Hemoglobin A1C     HbA1c POC (<> result, manual entry)     HbA1c, POC (prediabetic range)     HbA1c, POC (controlled diabetic range) 6.4 0.0 - 7.0 %  POCT glucose (manual entry)  Result Value Ref Range   POC Glucose 95 70 - 99 mg/dl  M0N 6.4 Reports compliance with taking metformin 500 mg daily. Doing okay with her eating habits. Checks BS before BF with range 111-130 and bedtime 78-122 Still doing good with eating habits and does 10,000 steps a day on average in her walking.  Wgh stable.  HL:  Taking Lipitor 10 mg daily as prescribed.  Still taking Fish Oil 1200 mg supplement daily.  Last LDL 46  Taking several supplements including B12 500 mcg, Vit D 50 mcg, Vit E 1800 mg, Vit C 250 mg and Mg Citrate 200 mg  Complaining of hair loss for the past 2 months.  States that she sheds when she combs her hair and when she washes her hair.  She does not use any harsh chemicals in the hair.  No new hair products.  HM:  yes to flu shot.  Due for COVID-19 booster.  Patient Active Problem List   Diagnosis Date Noted   Class 2 severe obesity due to excess calories with serious comorbidity and body mass index (BMI) of 35.0 to 35.9 in adult [facility]) 12/20/2022   Gastroesophageal reflux disease without esophagitis 10/31/2020    Fibroadenoma of right breast 12/16/2018   Insomnia 12/16/2018   Rotator cuff syndrome of right shoulder 06/26/2017   Type 2 diabetes mellitus without complication, without long-term current use of insulin (HCC) 04/11/2017   Reactive hypertension    Class 1 obesity due to excess calories with serious comorbidity and body mass index (BMI) of 30.0 to 30.9 in adult    Essential hypertension    Diabetes mellitus type 2 in obese    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     atorvastatin (LIPITOR) 10 MG tablet Take 1 tablet (10 mg total) by mouth daily. 30 tablet 6   Blood Glucose Monitoring Suppl (TRUE METRIX METER) w/Device KIT Use as directed 1 kit 0   clotrimazole-betamethasone (LOTRISONE) cream Apply 1 application topically 2 (two) times daily. For 10 days then as needed 30 g 3   glucose blood (TRUE METRIX BLOOD GLUCOSE TEST) test strip Use to check blood sugar once daily. E11.69 100 each 0   omeprazole (PRILOSEC) 20 MG capsule Take 20 mg by mouth daily.     TRUEPLUS LANCETS 28G MISC Use as  directed 100 each 4   No current facility-administered medications on file prior to visit.    No Known Allergies  Social History   Socioeconomic History   Marital status: Single    Spouse name: Not on file   Number of children: 0   Years of education: Not on file   Highest education level: Bachelor's degree (e.g., BA, AB, BS)  Occupational History   Not on file  Tobacco Use   Smoking status: Never   Smokeless tobacco: Never  Vaping Use   Vaping status: Never Used  Substance and Sexual Activity   Alcohol use: No   Drug use: No   Sexual activity: Not Currently  Other Topics Concern   Not on file  Social History Narrative   Right Handed   Lives in a one story   Drinks caffeine   Social Determinants of Health    Financial Resource Strain: Not on file  Food Insecurity: No Food Insecurity (10/11/2022)   Hunger Vital Sign    Worried About Running Out of Food in the Last Year: Never true    Ran Out of Food in the Last Year: Never true  Transportation Needs: No Transportation Needs (10/11/2022)   PRAPARE - Administrator, Civil Service (Medical): No    Lack of Transportation (Non-Medical): No  Physical Activity: Inactive (09/05/2017)   Exercise Vital Sign    Days of Exercise per Week: 0 days    Minutes of Exercise per Session: 0 min  Stress: No Stress Concern Present (09/05/2017)   [employer] of Occupational Health - Occupational Stress Questionnaire    Feeling of Stress : Not at all  Social Connections: Somewhat Isolated (09/05/2017)   Social Connection and Isolation Panel [NHANES]    Frequency of Communication with Friends and Family: More than three times a week    Frequency of Social Gatherings with Friends and Family: Twice a week    Attends Religious Services: More than 4 times per year    Active Member of [employer] or Organizations: No    Attends Banker Meetings: Never    Marital Status: Never married  Intimate Partner Violence: Unknown (09/05/2017)   Humiliation, Afraid, Rape, and Kick questionnaire    Fear of Current or Ex-Partner: Patient declined    Emotionally Abused: Patient declined    Physically Abused: Patient declined    Sexually Abused: Patient declined    Family History  Problem Relation Age of Onset   Stroke Mother    Hypertension Mother    Diabetes Father     Past Surgical History:  Procedure Laterality Date   IR ANGIO INTRA EXTRACRAN SEL INTERNAL CAROTID BILAT MOD SED  12/25/2016   IR ANGIO VERTEBRAL SEL VERTEBRAL UNI L MOD SED  12/25/2016   IR ANGIOGRAM FOLLOW UP STUDY  12/25/2016   IR ANGIOGRAM FOLLOW UP STUDY  12/25/2016   IR ANGIOGRAM FOLLOW UP STUDY  12/25/2016   IR ANGIOGRAM FOLLOW UP STUDY  12/25/2016   IR TRANSCATH/EMBOLIZ  12/25/2016    RADIOLOGY WITH ANESTHESIA N/A 12/25/2016   Procedure: RADIOLOGY WITH ANESTHESIA;  Surgeon: Lisbeth Renshaw, MD;  Location: [facility] OR;  Service: Radiology;  Laterality: N/A;    ROS: Review of Systems Negative except as stated above  PHYSICAL EXAM: BP 113/74 (BP Location: Left Arm, Patient Position: Sitting, Cuff Size: Large)   Pulse 86   Temp 98.7 F (37.1 C) (Oral)   Ht 5\' 4"  (1.626 m)   Wt 209 lb (94.8 kg)  LMP 02/19/2022   SpO2 98%   BMI 35.87 kg/m   Wt Readings from Last 3 Encounters:  04/22/23 209 lb (94.8 kg)  12/20/22 210 lb (95.3 kg)  10/11/22 212 lb (96.2 kg)  ' Physical Exam  General appearance - alert, well appearing, and in no distress Mental status - normal mood, behavior, speech, dress, motor activity, and thought processes Neck - supple, no significant adenopathy Chest - clear to auscultation, no wheezes, rales or rhonchi, symmetric air entry Heart - normal rate, regular rhythm, normal S1, S2, no murmurs, rubs, clicks or gallops Extremities - peripheral pulses normal, no pedal edema, no clubbing or cyanosis Hair:  small amount hair comes out on pulling on shaft.       Latest Ref Rng & Units 12/20/2022    2:10 PM 11/02/2021    3:03 PM 11/02/2020    9:10 AM  CMP  Glucose 70 - 99 mg/dL 409  811  914   BUN 6 - 24 mg/dL 18  18  17    Creatinine 0.57 - 1.00 mg/dL 7.82  9.56  2.13   Sodium 134 - 144 mmol/L 142  146  138   Potassium 3.5 - 5.2 mmol/L 4.4  4.7  4.7   Chloride 96 - 106 mmol/L 105  105  103   CO2 20 - 29 mmol/L 21  23  21    Calcium 8.7 - 10.2 mg/dL 9.3  9.9  9.5   Total Protein 6.0 - 8.5 g/dL 7.1  7.8  7.3   Total Bilirubin 0.0 - 1.2 mg/dL 0.6  0.6  0.5   Alkaline Phos 44 - 121 IU/L 95  99  101   AST 0 - 40 IU/L 17  20  21    ALT 0 - 32 IU/L 19  26  18     Lipid Panel     Component Value Date/Time   CHOL 112 12/20/2022 1410   TRIG 194 (H) 12/20/2022 1410   HDL 38 (L) 12/20/2022 1410   CHOLHDL 2.9 12/20/2022 1410   LDLCALC 43 12/20/2022 1410     CBC    Component Value Date/Time   WBC 7.6 12/20/2022 1410   WBC 6.3 02/05/2017 0443   RBC 4.86 12/20/2022 1410   RBC 4.16 02/05/2017 0443   HGB 13.7 12/20/2022 1410   HCT 41.3 12/20/2022 1410   PLT 341 12/20/2022 1410   MCV 85 12/20/2022 1410   MCH 28.2 12/20/2022 1410   MCH 27.2 02/05/2017 0443   MCHC 33.2 12/20/2022 1410   MCHC 31.1 02/05/2017 0443   RDW 14.0 12/20/2022 1410   LYMPHSABS 1.8 02/05/2017 0443   MONOABS 0.4 02/05/2017 0443   EOSABS 0.1 02/05/2017 0443   BASOSABS 0.0 02/05/2017 0443    ASSESSMENT AND PLAN: 1. Type 2 diabetes mellitus with obesity (HCC) At goal.  Continue metformin 500 mg once a day.  Encouraged her to continue regular exercise and healthy eating habits - POCT glycosylated hemoglobin (Hb A1C) - POCT glucose (manual entry) - metFORMIN (GLUCOPHAGE) 500 MG tablet; Take 1 tablet (500 mg total) by mouth daily with breakfast.  Dispense: 90 tablet; Refill: 1  2. Hypertension associated with type 2 diabetes mellitus (HCC) At goal.  Continue lisinopril 10 mg daily. - lisinopril (ZESTRIL) 10 MG tablet; Take 1 tablet (10 mg total) by mouth daily.  Dispense: 90 tablet; Refill: 1  3. Hyperlipidemia associated with type 2 diabetes mellitus (HCC) Continue atorvastatin 10 mg daily.  4. Hair thinning - [id number] - Ambulatory referral  to Dermatology  5. Need for influenza vaccination Given flu shot today.  6. Need for COVID-19 vaccine Encouraged her to get COVID booster shot if she is able to afford.  Advised that she can get it at any outside pharmacy.     Patient was given the opportunity to ask questions.  Patient verbalized understanding of the plan and was able to repeat key elements of the plan.   This documentation was completed using Paediatric nurse.  Any transcriptional errors are unintentional.  Orders Placed This Encounter  Procedures   Flu vaccine trivalent PF, 6mos and  older(Flulaval,Afluria,Fluarix,Fluzone)   TSH+T4F+T3Free   Ambulatory referral to Dermatology   POCT glycosylated hemoglobin (Hb A1C)   POCT glucose (manual entry)     Requested Prescriptions   Signed Prescriptions Disp Refills   metFORMIN (GLUCOPHAGE) 500 MG tablet 90 tablet 1    Sig: Take 1 tablet (500 mg total) by mouth daily with breakfast.   lisinopril (ZESTRIL) 10 MG tablet 90 tablet 1    Sig: Take 1 tablet (10 mg total) by mouth daily.    No follow-ups on file.  Jonah Blue, MD, FACP

## 2023-04-23 LAB — TSH+T4F+T3FREE
Free T4: 1.12 ng/dL (ref 0.82–1.77)
T3, Free: 3.3 pg/mL (ref 2.0–4.4)
TSH: 2.6 u[IU]/mL (ref 0.450–4.500)

## 2023-08-13 ENCOUNTER — Other Ambulatory Visit: Payer: Self-pay | Admitting: Internal Medicine

## 2023-08-13 DIAGNOSIS — E1169 Type 2 diabetes mellitus with other specified complication: Secondary | ICD-10-CM

## 2023-08-22 ENCOUNTER — Ambulatory Visit: Payer: Self-pay | Attending: Internal Medicine | Admitting: Internal Medicine

## 2023-08-22 ENCOUNTER — Encounter: Payer: Self-pay | Admitting: Internal Medicine

## 2023-08-22 VITALS — BP 111/75 | HR 91 | Temp 98.3°F | Ht 64.0 in | Wt 209.0 lb

## 2023-08-22 DIAGNOSIS — I152 Hypertension secondary to endocrine disorders: Secondary | ICD-10-CM

## 2023-08-22 DIAGNOSIS — E66812 Obesity, class 2: Secondary | ICD-10-CM

## 2023-08-22 DIAGNOSIS — E1159 Type 2 diabetes mellitus with other circulatory complications: Secondary | ICD-10-CM

## 2023-08-22 DIAGNOSIS — Z7984 Long term (current) use of oral hypoglycemic drugs: Secondary | ICD-10-CM

## 2023-08-22 DIAGNOSIS — E785 Hyperlipidemia, unspecified: Secondary | ICD-10-CM

## 2023-08-22 DIAGNOSIS — Z1211 Encounter for screening for malignant neoplasm of colon: Secondary | ICD-10-CM

## 2023-08-22 DIAGNOSIS — Z6835 Body mass index (BMI) 35.0-35.9, adult: Secondary | ICD-10-CM

## 2023-08-22 DIAGNOSIS — E669 Obesity, unspecified: Secondary | ICD-10-CM

## 2023-08-22 DIAGNOSIS — E1169 Type 2 diabetes mellitus with other specified complication: Secondary | ICD-10-CM

## 2023-08-22 LAB — POCT GLYCOSYLATED HEMOGLOBIN (HGB A1C): HbA1c, POC (controlled diabetic range): 6.3 % (ref 0.0–7.0)

## 2023-08-22 LAB — GLUCOSE, POCT (MANUAL RESULT ENTRY): POC Glucose: 109 mg/dL — AB (ref 70–99)

## 2023-08-22 MED ORDER — METFORMIN HCL 500 MG PO TABS
500.0000 mg | ORAL_TABLET | Freq: Every day | ORAL | 1 refills | Status: DC
Start: 2023-08-22 — End: 2023-12-20

## 2023-08-22 MED ORDER — LISINOPRIL 10 MG PO TABS
10.0000 mg | ORAL_TABLET | Freq: Every day | ORAL | 1 refills | Status: DC
Start: 2023-08-22 — End: 2023-12-20

## 2023-08-22 MED ORDER — ATORVASTATIN CALCIUM 10 MG PO TABS
10.0000 mg | ORAL_TABLET | Freq: Every day | ORAL | 1 refills | Status: DC
Start: 2023-08-22 — End: 2023-12-20

## 2023-08-22 NOTE — Progress Notes (Signed)
Patient ID: Aimee [name], female    DOB: 02/26/1976  MRN: [id number]  CC: Diabetes (DM f/u. Med refill. /No questions / concerns/No to flu vax)   Subjective: Aimee [name] is a 48 y.o. female who presents for chronic ds management. 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     HTN: Taking the lisinopril 10 mg daily as prescribed.  She limits salt in the foods.  No CP/SOB  Checks BP daily and has log - 111/75, 120/80, 112/77 DM: Results for orders placed or performed in visit on 08/22/23  POCT glucose (manual entry)   Collection Time: 08/22/23  2:19 PM  Result Value Ref Range   POC Glucose 109 (A) 70 - 99 mg/dl  POCT glycosylated hemoglobin (Hb A1C)   Collection Time: 08/22/23  2:53 PM  Result Value Ref Range   Hemoglobin A1C     HbA1c POC (<> result, manual entry)     HbA1c, POC (prediabetic range)     HbA1c, POC (controlled diabetic range) 6.3 0.0 - 7.0 %  A1C 6.3 Reports compliance with taking metformin 500 mg daily.  Checks BS daily.  She has log with her.  Range has been 118-130. Doing well with eating habits Walks 30 mins daily No wgh gain since last visit Taking and tolerating Lipitor 10 mg daily with Fish Oil Over due for eye exam.  Will have done at [facility].   Some itchy throat at times.  Takes OTC generic Claritin, not much relief  Patient Active Problem List   Diagnosis Date Noted   Class 2 severe obesity due to excess calories with serious comorbidity and body mass index (BMI) of 35.0 to 35.9 in adult [facility] 12/20/2022   Gastroesophageal reflux disease without esophagitis 10/31/2020   Fibroadenoma of right breast 12/16/2018   Insomnia 12/16/2018   Rotator cuff syndrome of right shoulder 06/26/2017   Type 2 diabetes mellitus without complication, without long-term current use of insulin (HCC)  04/11/2017   Reactive hypertension    Class 1 obesity due to excess calories with serious comorbidity and body mass index (BMI) of 30.0 to 30.9 in adult    Essential hypertension    Type 2 diabetes mellitus with obesity (HCC)    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     Blood Glucose Monitoring Suppl (TRUE METRIX METER) w/Device KIT Use as directed 1 kit 0   clotrimazole-betamethasone (LOTRISONE) cream Apply 1 application topically 2 (two) times daily. For 10 days then as needed 30 g 3   glucose blood (TRUE METRIX BLOOD GLUCOSE TEST) test strip Use to check blood sugar once daily. E11.69 100 each 0   omeprazole (PRILOSEC) 20 MG capsule Take 20 mg by mouth daily.     TRUEPLUS LANCETS 28G MISC Use as directed 100 each 4   No current facility-administered medications on file prior to visit.    No Known Allergies  Social History   Socioeconomic History   Marital status: Single    Spouse name: Not on file   Number of children: 0   Years of education: Not on file   Highest education level: Bachelor's degree (e.g., BA, AB,  BS)  Occupational History   Not on file  Tobacco Use   Smoking status: Never   Smokeless tobacco: Never  Vaping Use   Vaping status: Never Used  Substance and Sexual Activity   Alcohol use: No   Drug use: No   Sexual activity: Not Currently  Other Topics Concern   Not on file  Social History Narrative   Right Handed   Lives in a one story   Drinks caffeine   Social Drivers of Health   Financial Resource Strain: Low Risk  (08/22/2023)   Overall Financial Resource Strain (CARDIA)    Difficulty of Paying Living Expenses: Not hard at all  Food Insecurity: No Food Insecurity (08/22/2023)   Hunger Vital Sign    Worried About Running Out of Food in the Last Year: Never true    Ran Out of  Food in the Last Year: Never true  Transportation Needs: No Transportation Needs (08/22/2023)   PRAPARE - Administrator, Civil Service (Medical): No    Lack of Transportation (Non-Medical): No  Physical Activity: Sufficiently Active (08/22/2023)   Exercise Vital Sign    Days of Exercise per Week: 5 days    Minutes of Exercise per Session: 60 min  Stress: No Stress Concern Present (08/22/2023)   [employer] of Occupational Health - Occupational Stress Questionnaire    Feeling of Stress : Not at all  Social Connections: Moderately Isolated (08/22/2023)   Social Connection and Isolation Panel [NHANES]    Frequency of Communication with Friends and Family: Twice a week    Frequency of Social Gatherings with Friends and Family: Once a week    Attends Religious Services: More than 4 times per year    Active Member of [employer] or Organizations: No    Attends Banker Meetings: Never    Marital Status: Never married  Intimate Partner Violence: Not At Risk (08/22/2023)   Humiliation, Afraid, Rape, and Kick questionnaire    Fear of Current or Ex-Partner: No    Emotionally Abused: No    Physically Abused: No    Sexually Abused: No    Family History  Problem Relation Age of Onset   Stroke Mother    Hypertension Mother    Diabetes Father     Past Surgical History:  Procedure Laterality Date   IR ANGIO INTRA EXTRACRAN SEL INTERNAL CAROTID BILAT MOD SED  12/25/2016   IR ANGIO VERTEBRAL SEL VERTEBRAL UNI L MOD SED  12/25/2016   IR ANGIOGRAM FOLLOW UP STUDY  12/25/2016   IR ANGIOGRAM FOLLOW UP STUDY  12/25/2016   IR ANGIOGRAM FOLLOW UP STUDY  12/25/2016   IR ANGIOGRAM FOLLOW UP STUDY  12/25/2016   IR TRANSCATH/EMBOLIZ  12/25/2016   RADIOLOGY WITH ANESTHESIA N/A 12/25/2016   Procedure: RADIOLOGY WITH ANESTHESIA;  Surgeon: Lisbeth Renshaw, MD;  Location: [facility] OR;  Service: Radiology;  Laterality: N/A;    ROS: Review of Systems Negative except as stated above  PHYSICAL  EXAM: BP 111/75   Pulse 91   Temp 98.3 F (36.8 C) (Oral)   Ht 5\' 4"  (1.626 m)   Wt 209 lb (94.8 kg)   LMP 02/19/2022   SpO2 99%   BMI 35.87 kg/m   Wt Readings from Last 3 Encounters:  08/22/23 209 lb (94.8 kg)  04/22/23 209 lb (94.8 kg)  12/20/22 210 lb (95.3 kg)    Physical Exam  General appearance - alert, well appearing, middle age Hispanic female and in no distress  Mental status - normal mood, behavior, speech, dress, motor activity, and thought processes Mouth - mucous membranes moist, pharynx normal without lesions Neck - supple, no significant adenopathy Chest - clear to auscultation, no wheezes, rales or rhonchi, symmetric air entry Heart - normal rate, regular rhythm, normal S1, S2, no murmurs, rubs, clicks or gallops Extremities - peripheral pulses normal, no pedal edema, no clubbing or cyanosis      Latest Ref Rng & Units 12/20/2022    2:10 PM 11/02/2021    3:03 PM 11/02/2020    9:10 AM  CMP  Glucose 70 - 99 mg/dL 606  301  601   BUN 6 - 24 mg/dL 18  18  17    Creatinine 0.57 - 1.00 mg/dL 0.93  2.35  5.73   Sodium 134 - 144 mmol/L 142  146  138   Potassium 3.5 - 5.2 mmol/L 4.4  4.7  4.7   Chloride 96 - 106 mmol/L 105  105  103   CO2 20 - 29 mmol/L 21  23  21    Calcium 8.7 - 10.2 mg/dL 9.3  9.9  9.5   Total Protein 6.0 - 8.5 g/dL 7.1  7.8  7.3   Total Bilirubin 0.0 - 1.2 mg/dL 0.6  0.6  0.5   Alkaline Phos 44 - 121 IU/L 95  99  101   AST 0 - 40 IU/L 17  20  21    ALT 0 - 32 IU/L 19  26  18     Lipid Panel     Component Value Date/Time   CHOL 112 12/20/2022 1410   TRIG 194 (H) 12/20/2022 1410   HDL 38 (L) 12/20/2022 1410   CHOLHDL 2.9 12/20/2022 1410   LDLCALC 43 12/20/2022 1410    CBC    Component Value Date/Time   WBC 7.6 12/20/2022 1410   WBC 6.3 02/05/2017 0443   RBC 4.86 12/20/2022 1410   RBC 4.16 02/05/2017 0443   HGB 13.7 12/20/2022 1410   HCT 41.3 12/20/2022 1410   PLT 341 12/20/2022 1410   MCV 85 12/20/2022 1410   MCH 28.2 12/20/2022  1410   MCH 27.2 02/05/2017 0443   MCHC 33.2 12/20/2022 1410   MCHC 31.1 02/05/2017 0443   RDW 14.0 12/20/2022 1410   LYMPHSABS 1.8 02/05/2017 0443   MONOABS 0.4 02/05/2017 0443   EOSABS 0.1 02/05/2017 0443   BASOSABS 0.0 02/05/2017 0443    ASSESSMENT AND PLAN: 1. Type 2 diabetes mellitus with obesity (HCC) (Primary) At goal.  Continue metformin, healthy eating habits and regular exercise - POCT glycosylated hemoglobin (Hb A1C) - POCT glucose (manual entry) - metFORMIN (GLUCOPHAGE) 500 MG tablet; Take 1 tablet (500 mg total) by mouth daily with breakfast.  Dispense: 90 tablet; Refill: 1  2. Hyperlipidemia associated with type 2 diabetes mellitus (HCC) Continue atorvastatin - atorvastatin (LIPITOR) 10 MG tablet; Take 1 tablet (10 mg total) by mouth daily.  Dispense: 90 tablet; Refill: 1  3. Hypertension associated with type 2 diabetes mellitus (HCC) At goal.  Continue lisinopril - lisinopril (ZESTRIL) 10 MG tablet; Take 1 tablet (10 mg total) by mouth daily.  Dispense: 90 tablet; Refill: 1  4. Screening for colon cancer - Fecal occult blood, imunochemical([facility])  In regards to the throat itching, I recommend that she try one of the other allergy medications over-the-counter like Zyrtec or Allegra.  Advised that if she uses Allegra it does cause drowsiness.  Patient was given the opportunity to ask questions.  Patient verbalized understanding  of the plan and was able to repeat key elements of the plan.   This documentation was completed using Paediatric nurse.  Any transcriptional errors are unintentional.  Orders Placed This Encounter  Procedures   Fecal occult blood, imunochemical([facility])   POCT glycosylated hemoglobin (Hb A1C)   POCT glucose (manual entry)     Requested Prescriptions   Signed Prescriptions Disp Refills   atorvastatin (LIPITOR) 10 MG tablet 90 tablet 1    Sig: Take 1 tablet (10 mg total) by mouth daily.    lisinopril (ZESTRIL) 10 MG tablet 90 tablet 1    Sig: Take 1 tablet (10 mg total) by mouth daily.   metFORMIN (GLUCOPHAGE) 500 MG tablet 90 tablet 1    Sig: Take 1 tablet (500 mg total) by mouth daily with breakfast.    Return in about 4 months (around 12/20/2023).  Jonah Blue, MD, FACP

## 2023-09-13 LAB — FECAL OCCULT BLOOD, IMMUNOCHEMICAL: Fecal Occult Bld: NEGATIVE

## 2023-12-10 LAB — HM DIABETES EYE EXAM

## 2023-12-20 ENCOUNTER — Ambulatory Visit: Payer: Self-pay | Attending: Internal Medicine | Admitting: Internal Medicine

## 2023-12-20 ENCOUNTER — Encounter: Payer: Self-pay | Admitting: Internal Medicine

## 2023-12-20 VITALS — BP 100/69 | HR 103 | Temp 98.0°F | Ht 64.0 in | Wt 208.0 lb

## 2023-12-20 DIAGNOSIS — E119 Type 2 diabetes mellitus without complications: Secondary | ICD-10-CM

## 2023-12-20 DIAGNOSIS — E669 Obesity, unspecified: Secondary | ICD-10-CM

## 2023-12-20 DIAGNOSIS — E785 Hyperlipidemia, unspecified: Secondary | ICD-10-CM

## 2023-12-20 DIAGNOSIS — Z7984 Long term (current) use of oral hypoglycemic drugs: Secondary | ICD-10-CM

## 2023-12-20 DIAGNOSIS — E1169 Type 2 diabetes mellitus with other specified complication: Secondary | ICD-10-CM

## 2023-12-20 DIAGNOSIS — I152 Hypertension secondary to endocrine disorders: Secondary | ICD-10-CM

## 2023-12-20 DIAGNOSIS — Z6835 Body mass index (BMI) 35.0-35.9, adult: Secondary | ICD-10-CM

## 2023-12-20 DIAGNOSIS — Z1231 Encounter for screening mammogram for malignant neoplasm of breast: Secondary | ICD-10-CM

## 2023-12-20 DIAGNOSIS — I10 Essential (primary) hypertension: Secondary | ICD-10-CM

## 2023-12-20 LAB — POCT GLYCOSYLATED HEMOGLOBIN (HGB A1C): HbA1c, POC (controlled diabetic range): 6.2 % (ref 0.0–7.0)

## 2023-12-20 LAB — GLUCOSE, POCT (MANUAL RESULT ENTRY): POC Glucose: 133 mg/dL — AB (ref 70–99)

## 2023-12-20 MED ORDER — ATORVASTATIN CALCIUM 10 MG PO TABS: 10.0000 mg | ORAL_TABLET | Freq: Every day | ORAL | 1 refills | Status: DC

## 2023-12-20 MED ORDER — LISINOPRIL 10 MG PO TABS: 10.0000 mg | ORAL_TABLET | Freq: Every day | ORAL | 1 refills | Status: DC

## 2023-12-20 MED ORDER — METFORMIN HCL 500 MG PO TABS: 500.0000 mg | ORAL_TABLET | Freq: Every day | ORAL | 1 refills | Status: DC

## 2023-12-20 NOTE — Progress Notes (Signed)
 Patient ID: Aimee [name], female    DOB: 1976/06/03  MRN: [id number]  CC: Diabetes (DM f/u. Med refills. /Discuss mammogram )   Subjective: Aimee [name] is a 48 y.o. female who presents for chronic ds management. 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    DM: Results for orders placed or performed in visit on 12/20/23  POCT glucose (manual entry)   Collection Time: 12/20/23  2:30 PM  Result Value Ref Range   POC Glucose 133 (A) 70 - 99 mg/dl  POCT glycosylated hemoglobin (Hb A1C)   Collection Time: 12/20/23  3:01 PM  Result Value Ref Range   Hemoglobin A1C     HbA1c POC (<> result, manual entry)     HbA1c, POC (prediabetic range)     HbA1c, POC (controlled diabetic range) 6.2 0.0 - 7.0 %  Reports compliance with taking metformin  500 mg daily.  Checks BS daily.  She has log with her. Range 110-120  before BF Doing well with eating habits but lately she gets full faster.  No problems swallowing Walks 3-4x/wk for 30 mins Had [facility] exam 12/10/23 at [facility] and has report Taking and tolerating Lipitor 10 mg daily with Fish Oil  Taking and tolerating Lisinopril 10 mg daily.  Has log.  Recent readings - 112/81, 113/79, 108/75   Patient Active Problem List   Diagnosis Date Noted   Class 2 severe obesity due to excess calories with serious comorbidity and body mass index (BMI) of 35.0 to 35.9 in adult [facility]) 12/20/2022   Gastroesophageal reflux disease without esophagitis 10/31/2020   Fibroadenoma of right breast 12/16/2018   Insomnia 12/16/2018   Rotator cuff syndrome of right shoulder 06/26/2017   Type 2 diabetes mellitus without complication, without long-term current use of insulin  (HCC) 04/11/2017   Reactive hypertension    Class 1 obesity due to excess calories with serious comorbidity and body mass index  (BMI) of 30.0 to 30.9 in adult    Essential hypertension    Type 2 diabetes mellitus with obesity (HCC)    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     Blood Glucose Monitoring Suppl (TRUE METRIX METER) w/Device KIT Use as directed 1 kit 0   clotrimazole-betamethasone (LOTRISONE) cream Apply 1 application topically 2 (two) times daily. For 10 days then as needed 30 g 3   diclofenac  (VOLTAREN ) 75 MG EC tablet Take 1 tablet (75 mg total) by mouth 2 (two) times daily. 50 tablet 2   glucose blood (TRUE METRIX BLOOD GLUCOSE TEST) test strip Use to check blood sugar once daily. E11.69 100 each 0   ibuprofen (ADVIL,MOTRIN) 200 MG tablet Take 200 mg by mouth as needed.     omeprazole (PRILOSEC) 20 MG capsule Take 20 mg by mouth daily.     TRUEPLUS LANCETS 28G MISC Use as directed 100 each 4   No current facility-administered medications on file prior to visit.    No Known Allergies  Social History   Socioeconomic History   Marital status: Single    Spouse name: Not on file   Number of children: 0   Years of education: Not on  file   Highest education level: Bachelor's degree (e.g., BA, AB, BS)  Occupational History   Not on file  Tobacco Use   Smoking status: Never   Smokeless tobacco: Never  Vaping Use   Vaping status: Never Used  Substance and Sexual Activity   Alcohol use: No   Drug use: No   Sexual activity: Not Currently  Other Topics Concern   Not on file  Social History Narrative   ** Merged History Encounter **       Right Handed Lives in a one story Drinks caffeine    Social Drivers of Health   Financial Resource Strain: Low Risk  (08/22/2023)   Overall Financial Resource Strain (CARDIA)    Difficulty of Paying Living Expenses: Not hard at all  Food Insecurity: No Food Insecurity (08/22/2023)    Hunger Vital Sign    Worried About Running Out of Food in the Last Year: Never true    Ran Out of Food in the Last Year: Never true  Transportation Needs: No Transportation Needs (08/22/2023)   PRAPARE - Administrator, Civil Service (Medical): No    Lack of Transportation (Non-Medical): No  Physical Activity: Sufficiently Active (08/22/2023)   Exercise Vital Sign    Days of Exercise per Week: 5 days    Minutes of Exercise per Session: 60 min  Stress: No Stress Concern Present (08/22/2023)   [employer] of Occupational Health - Occupational Stress Questionnaire    Feeling of Stress : Not at all  Social Connections: Moderately Isolated (08/22/2023)   Social Connection and Isolation Panel [NHANES]    Frequency of Communication with Friends and Family: Twice a week    Frequency of Social Gatherings with Friends and Family: Once a week    Attends Religious Services: More than 4 times per year    Active Member of [employer] or Organizations: No    Attends Banker Meetings: Never    Marital Status: Never married  Intimate Partner Violence: Not At Risk (08/22/2023)   Humiliation, Afraid, Rape, and Kick questionnaire    Fear of Current or Ex-Partner: No    Emotionally Abused: No    Physically Abused: No    Sexually Abused: No    Family History  Problem Relation Age of Onset   Stroke Mother    Hypertension Mother    Diabetes Father     Past Surgical History:  Procedure Laterality Date   IR ANGIO INTRA EXTRACRAN SEL INTERNAL CAROTID BILAT MOD SED  12/25/2016   IR ANGIO VERTEBRAL SEL VERTEBRAL UNI L MOD SED  12/25/2016   IR ANGIOGRAM FOLLOW UP STUDY  12/25/2016   IR ANGIOGRAM FOLLOW UP STUDY  12/25/2016   IR ANGIOGRAM FOLLOW UP STUDY  12/25/2016   IR ANGIOGRAM FOLLOW UP STUDY  12/25/2016   IR TRANSCATH/EMBOLIZ  12/25/2016   RADIOLOGY WITH ANESTHESIA N/A 12/25/2016   Procedure: RADIOLOGY WITH ANESTHESIA;  Surgeon: Augusto Blonder, MD;  Location: [facility] OR;  Service:  Radiology;  Laterality: N/A;    ROS: Review of Systems Negative except as stated above  PHYSICAL EXAM: BP 100/69 (BP Location: Left Arm, Patient Position: Sitting, Cuff Size: Large)   Pulse (!) 103   Temp 98 F (36.7 C) (Oral)   Ht 5\' 4"  (1.626 m)   Wt 208 lb (94.3 kg)   LMP 02/19/2022   SpO2 96%   BMI 35.70 kg/m   Wt Readings from Last 3 Encounters:  12/20/23 208 lb (94.3 kg)  08/22/23  209 lb (94.8 kg)  04/22/23 209 lb (94.8 kg)    Physical Exam  General appearance - alert, well appearing, and in no distress Mental status - normal mood, behavior, speech, dress, motor activity, and thought processes Neck - supple, no significant adenopathy Chest - clear to auscultation, no wheezes, rales or rhonchi, symmetric air entry Heart - normal rate, regular rhythm, normal S1, S2, no murmurs, rubs, clicks or gallops Extremities - peripheral pulses normal, no pedal edema, no clubbing or cyanosis Diabetic Foot Exam - Simple   Simple Foot Form Diabetic Foot exam was performed with the following findings: Yes 12/20/2023  3:11 PM  Visual Inspection See comments: Yes Sensation Testing Intact to touch and monofilament testing bilaterally: Yes Pulse Check Posterior Tibialis and Dorsalis pulse intact bilaterally: Yes Comments Callous on medical aspect both big toes.    Today we discussed     Latest Ref Rng & Units 12/20/2022    2:10 PM 11/02/2021    3:03 PM 11/02/2020    9:10 AM  CMP  Glucose 70 - 99 mg/dL 161  096  045   BUN 6 - 24 mg/dL 18  18  17    Creatinine 0.57 - 1.00 mg/dL 4.09  8.11  9.14   Sodium 134 - 144 mmol/L 142  146  138   Potassium 3.5 - 5.2 mmol/L 4.4  4.7  4.7   Chloride 96 - 106 mmol/L 105  105  103   CO2 20 - 29 mmol/L 21  23  21    Calcium 8.7 - 10.2 mg/dL 9.3  9.9  9.5   Total Protein 6.0 - 8.5 g/dL 7.1  7.8  7.3   Total Bilirubin 0.0 - 1.2 mg/dL 0.6  0.6  0.5   Alkaline Phos 44 - 121 IU/L 95  99  101   AST 0 - 40 IU/L 17  20  21    ALT 0 - 32 IU/L 19  26   18     Lipid Panel     Component Value Date/Time   CHOL 112 12/20/2022 1410   TRIG 194 (H) 12/20/2022 1410   HDL 38 (L) 12/20/2022 1410   CHOLHDL 2.9 12/20/2022 1410   LDLCALC 43 12/20/2022 1410    CBC    Component Value Date/Time   WBC 7.6 12/20/2022 1410   WBC 6.3 02/05/2017 0443   RBC 4.86 12/20/2022 1410   RBC 4.16 02/05/2017 0443   HGB 13.7 12/20/2022 1410   HCT 41.3 12/20/2022 1410   PLT 341 12/20/2022 1410   MCV 85 12/20/2022 1410   MCH 28.2 12/20/2022 1410   MCH 27.2 02/05/2017 0443   MCHC 33.2 12/20/2022 1410   MCHC 31.1 02/05/2017 0443   RDW 14.0 12/20/2022 1410   LYMPHSABS 1.8 02/05/2017 0443   MONOABS 0.4 02/05/2017 0443   EOSABS 0.1 02/05/2017 0443   BASOSABS 0.0 02/05/2017 0443    ASSESSMENT AND PLAN: 1. Type 2 diabetes mellitus with obesity (HCC) (Primary) At goal. Discussed trying her with a GLP1 agonist like Ozempic.  I went over with her how the medication works and potential side effects.  Patient states she would like to think about it.  In the meantime she will continue metformin .  Encouraged to continue healthy eating habits and regular exercise - CBC - Comprehensive metabolic panel with GFR - Lipid panel - POCT glycosylated hemoglobin (Hb A1C) - POCT glucose (manual entry) - metFORMIN  (GLUCOPHAGE ) 500 MG tablet; Take 1 tablet (500 mg total) by mouth daily with breakfast.  Dispense: 90 tablet; Refill: 1 - Microalbumin / creatinine urine ratio  2. Hyperlipidemia associated with type 2 diabetes mellitus (HCC) LDL at goal.  Continue atorvastatin - atorvastatin (LIPITOR) 10 MG tablet; Take 1 tablet (10 mg total) by mouth daily.  Dispense: 90 tablet; Refill: 1  3. Hypertension associated with type 2 diabetes mellitus (HCC) At goal.  Continue lisinopril and low-salt diet - lisinopril (ZESTRIL) 10 MG tablet; Take 1 tablet (10 mg total) by mouth daily.  Dispense: 90 tablet; Refill: 1  4. Encounter for screening mammogram for malignant neoplasm of  breast Patient given another mammogram scholarship. - MM 3D SCREENING MAMMOGRAM BILATERAL BREAST; Future  Patient was given the opportunity to ask questions.  Patient verbalized understanding of the plan and was able to repeat key elements of the plan.   This documentation was completed using Paediatric nurse.  Any transcriptional errors are unintentional.  Orders Placed This Encounter  Procedures   MM 3D SCREENING MAMMOGRAM BILATERAL BREAST   CBC   Comprehensive metabolic panel with GFR   Lipid panel   Microalbumin / creatinine urine ratio   POCT glycosylated hemoglobin (Hb A1C)   POCT glucose (manual entry)     Requested Prescriptions   Signed Prescriptions Disp Refills   atorvastatin (LIPITOR) 10 MG tablet 90 tablet 1    Sig: Take 1 tablet (10 mg total) by mouth daily.   lisinopril (ZESTRIL) 10 MG tablet 90 tablet 1    Sig: Take 1 tablet (10 mg total) by mouth daily.   metFORMIN  (GLUCOPHAGE ) 500 MG tablet 90 tablet 1    Sig: Take 1 tablet (500 mg total) by mouth daily with breakfast.    Return in about 4 months (around 04/21/2024).  Concetta Dee, MD, FACP

## 2023-12-20 NOTE — Patient Instructions (Signed)
 Trying you with Ozempic for diabetes and to help with weight loss.  You wanted to think about it.  I have printed some information for you to review about the medication. Semaglutide Injection What is this medication? SEMAGLUTIDE (SEM a GLOO tide) treats type 2 diabetes. It works by increasing insulin  levels in your body, which decreases your blood sugar (glucose). It also reduces the amount of sugar released into your blood and slows down your digestion. It may also be used to lower the risk of heart attack and stroke in people with type 2 diabetes. Changes to diet and exercise are often combined with this medication. This medicine may be used for other purposes; ask your health care provider or pharmacist if you have questions. COMMON BRAND NAME(S): OZEMPIC What should I tell my care team before I take this medication? They need to know if you have any of these conditions: Eye disease caused by diabetes Gallbladder disease Have or have had pancreatitis Having surgery Kidney disease Personal or family history of MEN 2, a condition that causes endocrine gland tumors Personal or family history of thyroid cancer Stomach or intestine problems, such as problems digesting food An unusual or allergic reaction to semaglutide, other medications, foods, dyes, or preservatives Pregnant or trying to get pregnant Breastfeeding How should I use this medication? This medication is for injection under the skin of your upper leg (thigh), stomach area, or upper arm. It is given once every week (every 7 days). You will be taught how to prepare and give this medication. Use exactly as directed. Take your medication at regular intervals. Do not take it more often than directed. If you use this medication with insulin , you should inject this medication and the insulin  separately. Do not mix them together. Do not give the injections right next to each other. Change (rotate) injection sites with each injection. It is  important that you put your used needles and syringes in a special sharps container. Do not put them in a trash can. If you do not have a sharps container, call your pharmacist or care team to get one. A special MedGuide will be given to you by the pharmacist with each prescription and refill. Be sure to read this information carefully each time. This medication comes with INSTRUCTIONS FOR USE. Ask your pharmacist for directions on how to use this medication. Read the information carefully. Talk to your pharmacist or care team if you have questions. Talk to your care team about the use of this medication in children. Special care may be needed. Overdosage: If you think you have taken too much of this medicine contact a poison control center or emergency room at once. NOTE: This medicine is only for you. Do not share this medicine with others. What if I miss a dose? If you miss a dose, take it as soon as you can within 5 days after the missed dose. Then take your next dose at your regular weekly time. If it has been longer than 5 days after the missed dose, do not take the missed dose. Take the next dose at your regular time. Do not take double or extra doses. If you have questions about a missed dose, contact your care team for advice. What may interact with this medication? Some medications may affect your blood sugar levels or hide the symptoms of low blood sugar (hypoglycemia). Talk with your care team about all the medications you take. They may suggest changes to your insulin  dose or  checking your blood sugar levels more often. Medications that may affect your blood sugar levels include: Alcohol Certain antibiotics, such as ciprofloxacin, levofloxacin, sulfamethoxazole; trimethoprim Certain medications for blood pressure or heart disease, such as benazepril, enalapril, lisinopril, losartan, valsartan Certain medications for mental health conditions, such as fluoxetine or olanzapine Diuretics,  such as hydrochlorothiazide (HCTZ) Estrogen and progestin hormones Other medications for diabetes Steroid medications, such as prednisone or cortisone Testosterone Thyroid hormones Medications that may mask symptoms of low blood sugar include: Beta blockers, such as atenolol, metoprolol, propranolol Clonidine Guanethidine Reserpine This list may not describe all possible interactions. Give your health care provider a list of all the medicines, herbs, non-prescription drugs, or dietary supplements you use. Also tell them if you smoke, drink alcohol, or use illegal drugs. Some items may interact with your medicine. What should I watch for while using this medication? Visit your care team for regular checks on your progress. Tell your care team if your symptoms do not start to get better or if they get worse. You may need blood work done while you are taking this medication. Your care team will monitor your HbA1C (A1C). This test shows what your average blood sugar (glucose) level was over the past 2 to 3 months. Know the symptoms of low blood sugar and know how to treat it. Always carry a source of quick sugar with you. Examples include hard sugar candy or glucose tablets. Make sure others know that you can choke if you eat or drink if your blood sugar is too low and you are unable to care for yourself. Get medical help at once. Tell your care team if you have high blood sugar. Your medication dose may change if your body is under stress. Some types of stress that may affect your blood sugar include fever, infection, and surgery. Do not share pens or cartridges with anyone, even if the needle is changed. Each pen should only be used by one person. Sharing could cause an infection. Wear a medical ID bracelet or chain. Carry a card that describes your condition. List the medications and doses you take on the card. Talk to your care team about your risk of cancer. You may be more at risk for certain  types of cancer if you take this medication. Talk to your care team right away if you have a lump or swelling in your neck, hoarseness that does not go away, trouble swallowing, shortness of breath, or trouble breathing. Make sure you stay hydrated while taking this medication. Drink water often. Eat fruits and veggies that have a high water content. Drink more water when it is hot or you are active. Talk to your care team right away if you have fever, infection, vomiting, diarrhea, or if you sweat a lot while taking this medication. The loss of too much body fluid may make it dangerous for you to take this medication. If you are going to need surgery or a procedure, tell your care team that you are taking this medication. What side effects may I notice from receiving this medication? Side effects that you should report to your care team as soon as possible: Allergic reactions--skin rash, itching, hives, swelling of the face, lips, tongue, or throat Change in vision Dehydration--increased thirst, dry mouth, feeling faint or lightheaded, headache, dark yellow or brown urine Gallbladder problems--severe stomach pain, nausea, vomiting, fever Heart palpitations--rapid, pounding, or irregular heartbeat Kidney injury--decrease in the amount of urine, swelling of the ankles, hands, or feet  Pancreatitis--severe stomach pain that spreads to your back or gets worse after eating or when touched, fever, nausea, vomiting Thoughts of suicide or self-harm, worsening mood, feelings of depression Thyroid cancer--new mass or lump in the neck, pain or trouble swallowing, trouble breathing, hoarseness Side effects that usually do not require medical attention (report these to your care team if they continue or are bothersome): Diarrhea Loss of appetite Nausea Upset stomach This list may not describe all possible side effects. Call your doctor for medical advice about side effects. You may report side effects to FDA  at 1-800-FDA-1088. Where should I keep my medication? Keep out of the reach of children. Store unopened pens in a refrigerator between 2 and 8 degrees C (36 and 46 degrees F). Do not freeze. Protect from light and heat. After you first use the pen, it can be stored for 56 days at room temperature between 15 and 30 degrees C (59 and 86 degrees F) or in a refrigerator. Throw away your used pen after 56 days or after the expiration date, whichever comes first. Do not store your pen with the needle attached. If the needle is left on, medication may leak from the pen. NOTE: This sheet is a summary. It may not cover all possible information. If you have questions about this medicine, talk to your doctor, pharmacist, or health care provider.  2024 Elsevier/Gold Standard (2023-07-05 00:00:00)

## 2023-12-21 ENCOUNTER — Ambulatory Visit: Payer: Self-pay | Admitting: Internal Medicine

## 2023-12-21 LAB — LIPID PANEL
Chol/HDL Ratio: 3.4 ratio (ref 0.0–4.4)
Cholesterol, Total: 130 mg/dL (ref 100–199)
HDL: 38 mg/dL — ABNORMAL LOW (ref >39)
LDL Chol Calc (NIH): 46 mg/dL (ref 0–99)
Triglycerides: 301 mg/dL — ABNORMAL HIGH (ref 0–149)
VLDL Cholesterol Cal: 46 mg/dL — ABNORMAL HIGH (ref 5–40)

## 2023-12-21 LAB — COMPREHENSIVE METABOLIC PANEL WITH GFR
ALT: 20 IU/L (ref 0–32)
AST: 20 IU/L (ref 0–40)
Albumin: 4.7 g/dL (ref 3.9–4.9)
Alkaline Phosphatase: 100 IU/L (ref 44–121)
BUN/Creatinine Ratio: 28 — ABNORMAL HIGH (ref 9–23)
BUN: 22 mg/dL (ref 6–24)
Bilirubin Total: 0.5 mg/dL (ref 0.0–1.2)
CO2: 21 mmol/L (ref 20–29)
Calcium: 10.1 mg/dL (ref 8.7–10.2)
Chloride: 104 mmol/L (ref 96–106)
Creatinine, Ser: 0.79 mg/dL (ref 0.57–1.00)
Globulin, Total: 2.9 g/dL (ref 1.5–4.5)
Glucose: 107 mg/dL — ABNORMAL HIGH (ref 70–99)
Potassium: 5 mmol/L (ref 3.5–5.2)
Sodium: 143 mmol/L (ref 134–144)
Total Protein: 7.6 g/dL (ref 6.0–8.5)
eGFR: 92 mL/min/{1.73_m2} (ref >59)

## 2023-12-21 LAB — CBC
Hematocrit: 46.1 % (ref 34.0–46.6)
Hemoglobin: 14.5 g/dL (ref 11.1–15.9)
MCH: 27.8 pg (ref 26.6–33.0)
MCHC: 31.5 g/dL (ref 31.5–35.7)
MCV: 89 fL (ref 79–97)
Platelets: 392 10*3/uL (ref 150–450)
RBC: 5.21 x10E6/uL (ref 3.77–5.28)
RDW: 13.5 % (ref 11.7–15.4)
WBC: 9.6 10*3/uL (ref 3.4–10.8)

## 2024-01-16 ENCOUNTER — Ambulatory Visit
Admission: RE | Admit: 2024-01-16 | Discharge: 2024-01-16 | Disposition: A | Discharge status: Home / Self Care | Payer: Self-pay | Source: Ambulatory Visit | Attending: Internal Medicine | Admitting: Internal Medicine

## 2024-01-16 DIAGNOSIS — Z1231 Encounter for screening mammogram for malignant neoplasm of breast: Secondary | ICD-10-CM

## 2024-04-20 ENCOUNTER — Telehealth: Payer: Self-pay | Admitting: Internal Medicine

## 2024-04-20 NOTE — Telephone Encounter (Signed)
 Confirmed appt for 9/16

## 2024-04-21 ENCOUNTER — Ambulatory Visit: Payer: Self-pay | Attending: Internal Medicine | Admitting: Internal Medicine

## 2024-04-21 ENCOUNTER — Encounter: Payer: Self-pay | Admitting: Internal Medicine

## 2024-04-21 VITALS — BP 107/70 | HR 74 | Temp 97.5°F | Ht 64.0 in | Wt 204.0 lb

## 2024-04-21 DIAGNOSIS — E1159 Type 2 diabetes mellitus with other circulatory complications: Secondary | ICD-10-CM

## 2024-04-21 DIAGNOSIS — Z6835 Body mass index (BMI) 35.0-35.9, adult: Secondary | ICD-10-CM

## 2024-04-21 DIAGNOSIS — Z7984 Long term (current) use of oral hypoglycemic drugs: Secondary | ICD-10-CM

## 2024-04-21 DIAGNOSIS — Z79899 Other long term (current) drug therapy: Secondary | ICD-10-CM

## 2024-04-21 DIAGNOSIS — E785 Hyperlipidemia, unspecified: Secondary | ICD-10-CM

## 2024-04-21 DIAGNOSIS — E1169 Type 2 diabetes mellitus with other specified complication: Secondary | ICD-10-CM

## 2024-04-21 DIAGNOSIS — Z23 Encounter for immunization: Secondary | ICD-10-CM

## 2024-04-21 DIAGNOSIS — L02211 Cutaneous abscess of abdominal wall: Secondary | ICD-10-CM

## 2024-04-21 DIAGNOSIS — I152 Hypertension secondary to endocrine disorders: Secondary | ICD-10-CM

## 2024-04-21 DIAGNOSIS — E669 Obesity, unspecified: Secondary | ICD-10-CM

## 2024-04-21 LAB — POCT GLYCOSYLATED HEMOGLOBIN (HGB A1C): HbA1c, POC (controlled diabetic range): 6.4 % (ref 0.0–7.0)

## 2024-04-21 LAB — GLUCOSE, POCT (MANUAL RESULT ENTRY): POC Glucose: 115 mg/dL — AB (ref 70–99)

## 2024-04-21 MED ORDER — SULFAMETHOXAZOLE-TRIMETHOPRIM 800-160 MG PO TABS: 1.0000 | ORAL_TABLET | Freq: Two times a day (BID) | ORAL | 0 refills | Status: DC

## 2024-04-21 MED ORDER — ATORVASTATIN CALCIUM 10 MG PO TABS: 10.0000 mg | ORAL_TABLET | Freq: Every day | ORAL | 3 refills | Status: DC

## 2024-04-21 MED ORDER — METFORMIN HCL 500 MG PO TABS: 500.0000 mg | ORAL_TABLET | Freq: Every day | ORAL | 3 refills | Status: AC

## 2024-04-21 MED ORDER — LISINOPRIL 10 MG PO TABS
10.0000 mg | ORAL_TABLET | Freq: Every day | ORAL | 3 refills | Status: AC
Start: 2024-04-21 — End: ?

## 2024-04-21 NOTE — Patient Instructions (Signed)
 VISIT SUMMARY:  Today, we reviewed your diabetes, hypertension, hyperlipidemia, and a recent skin issue. Your diabetes and hypertension are well-controlled, and you have made positive changes in your diet and exercise routine. We also discussed your skin issue and provided treatment for it.  YOUR PLAN:  -CUTANEOUS ABSCESS OF ABDOMINAL WALL: You have a small abscess on your abdomen, which is a localized infection that has drained some blood but no pus. We are prescribing antibiotics for 5-7 days to treat the infection. Please avoid squeezing the abscess and continue applying the over-the-counter antibiotic cream.  -TYPE 2 DIABETES MELLITUS: Your diabetes is well-controlled with an A1c of 6.4%. This means your average blood sugar levels are within the target range. Continue with your current diabetes management plan, including your diet and exercise. We will refill your metformin  prescription.  -HYPERTENSION: Your blood pressure is well-controlled with your current medication, lisinopril . Hypertension means high blood pressure, which can lead to serious health issues if not managed. Continue taking lisinopril  10 mg daily and keep limiting your salt intake.  -HYPERLIPIDEMIA: Your cholesterol levels are being managed well with atorvastatin , but your triglycerides are still elevated. Hyperlipidemia means you have high levels of fats in your blood, which can increase your risk of heart disease. Continue taking atorvastatin  10 mg daily at bedtime, keep taking fish oil supplements, and avoid fried foods while using healthier oils like olive oil.  -GENERAL HEALTH MAINTENANCE: You are due for your annual urine test to check for protein in your urine, which can be an indicator of kidney health. We also agreed to give you a flu shot today to protect you from the flu this season.  INSTRUCTIONS:  Please take the prescribed antibiotics for your skin abscess for 5-7 days and avoid squeezing it. Continue with your  current medications and lifestyle changes for diabetes, hypertension, and hyperlipidemia. We will refill your metformin  prescription. Make sure to get your annual urine test done and we will administer your flu shot today.

## 2024-04-21 NOTE — Progress Notes (Signed)
 Patient ID: Aimee [name] Single, female    DOB: 12-21-1975  MRN: [id number]  CC: Diabetes (DM f/u. Med refill. /No questions / concerns/Yes to flu vax)   Subjective: Aimee [name] is a 48 y.o. female who presents for chronic ds management. 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    Discussed the use of AI scribe software for clinical note transcription with the patient, who gave verbal consent to proceed.  History of Present Illness Aimee [name] is a 48 year old female with diabetes, hypertension, and hyperlipidemia who presents for a follow-up visit.  DM: Results for orders placed or performed in visit on 04/21/24  POCT glucose (manual entry)   Collection Time: 04/21/24  2:38 PM  Result Value Ref Range   POC Glucose 115 (A) 70 - 99 mg/dl  POCT glycosylated hemoglobin (Hb A1C)   Collection Time: 04/21/24  2:42 PM  Result Value Ref Range   Hemoglobin A1C     HbA1c POC (<> result, manual entry)     HbA1c, POC (prediabetic range)     HbA1c, POC (controlled diabetic range) 6.4 0.0 - 7.0 %  Her A1c is currently 6.4, increased from 6.2 four months ago. Her blood sugar readings at home range from 101 to 115 mg/dL before breakfast. She has improved her eating habits by avoiding sugary drinks and limiting bread and sweets. She has lost 4 pounds since her last visit, now weighing 204 pounds. She drinks water and coffee, and exercises by walking 20-30 minutes, three to four times a week.  She continues on lisinopril  10 mg daily for hypertension. Her home blood pressure readings show systolic values between 108 and 120 mmHg and diastolic values in the 70s. No chest pain, shortness of breath, or leg swelling. She is limiting her salt intake.  For hyperlipidemia, she remains on atorvastatin  10 mg daily. Her last cholesterol  check four months ago showed an LDL of 46 mg/dL. Her triglycerides were elevated, and she has been taking fish oil supplements. She avoids fried foods and uses healthier oils like olive oil.  She reports a recent skin issue on the right side of her abdomen, which started as a pimple last week. It drained a small amount of blood and has since reduced in size. She suspects it might be an insect bite, possibly from walking her dog. She has been applying over-the-counter antibiotic cream and is cautious not to squeeze it.    Patient Active Problem List   Diagnosis Date Noted   Class 2 severe obesity due to excess calories with serious comorbidity and body mass index (BMI) of 35.0 to 35.9 in adult [facility]) 12/20/2022   Gastroesophageal reflux disease without esophagitis 10/31/2020   Fibroadenoma of right breast 12/16/2018   Insomnia 12/16/2018   Rotator cuff syndrome of right shoulder 06/26/2017   Type 2 diabetes mellitus without complication, without long-term current use of insulin  (HCC) 04/11/2017   Reactive hypertension    Class 1 obesity due to excess calories with serious comorbidity and body mass index (BMI) of 30.0 to 30.9 in adult    Essential hypertension    Type 2 diabetes mellitus with obesity (HCC)    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     Blood Glucose Monitoring Suppl (TRUE METRIX METER) w/Device KIT Use as directed 1 kit 0   diclofenac  (VOLTAREN ) 75 MG EC tablet Take 1 tablet (75 mg total) by mouth 2 (two) times daily. 50 tablet 2   glucose blood (TRUE METRIX BLOOD GLUCOSE TEST) test strip Use to check blood sugar once daily. E11.69 100 each 0   ibuprofen (ADVIL,MOTRIN) 200 MG tablet Take 200 mg by mouth as needed.     TRUEPLUS LANCETS 28G MISC Use as directed 100 each 4   atorvastatin  (LIPITOR) 10 MG tablet  Take 1 tablet (10 mg total) by mouth daily. 90 tablet 1   clotrimazole-betamethasone (LOTRISONE) cream Apply 1 application topically 2 (two) times daily. For 10 days then as needed (Patient not taking: Reported on 04/21/2024) 30 g 3   lisinopril  (ZESTRIL ) 10 MG tablet Take 1 tablet (10 mg total) by mouth daily. 90 tablet 1   metFORMIN  (GLUCOPHAGE ) 500 MG tablet Take 1 tablet (500 mg total) by mouth daily with breakfast. 90 tablet 1   omeprazole (PRILOSEC) 20 MG capsule Take 20 mg by mouth daily. (Patient not taking: Reported on 04/21/2024)     No current facility-administered medications on file prior to visit.    No Known Allergies  Social History   Socioeconomic History   Marital status: Single    Spouse name: Not on file   Number of children: 0   Years of education: Not on file   Highest education level: Bachelor's degree (e.g., BA, AB, BS)  Occupational History   Not on file  Tobacco Use   Smoking status: Never   Smokeless tobacco: Never  Vaping Use   Vaping status: Never Used  Substance and Sexual Activity   Alcohol use: No   Drug use: No   Sexual activity: Not Currently  Other Topics Concern   Not on file  Social History Narrative   ** Merged History Encounter **       Right Handed Lives in a one story Drinks caffeine    Social Drivers of Health   Financial Resource Strain: Low Risk  (04/21/2024)   Overall Financial Resource Strain (CARDIA)    Difficulty of Paying Living Expenses: Not very hard  Food Insecurity: No Food Insecurity (04/21/2024)   Hunger Vital Sign    Worried About Running Out of Food in the Last Year: Never true    Ran Out of Food in the Last Year: Never true  Transportation Needs: No Transportation Needs (04/21/2024)   PRAPARE - Administrator, Civil Service (Medical): No    Lack of Transportation (Non-Medical): No  Physical Activity: Insufficiently Active (04/21/2024)   Exercise Vital Sign    Days of Exercise per Week: 4 days     Minutes of Exercise per Session: 30 min  Stress: No Stress Concern Present (04/21/2024)   [employer] of Occupational Health - Occupational Stress Questionnaire    Feeling of Stress: Not at all  Social Connections: Moderately Isolated (04/21/2024)   Social Connection and Isolation Panel    Frequency of Communication with Friends and Family: Once a week    Frequency of Social Gatherings with Friends and Family: Once a week    Attends Religious Services: More than 4 times per year    Active Member of [employer] or Organizations: Yes    Attends Banker Meetings: More than 4 times per year  Marital Status: Never married  Intimate Partner Violence: Not At Risk (04/21/2024)   Humiliation, Afraid, Rape, and Kick questionnaire    Fear of Current or Ex-Partner: No    Emotionally Abused: No    Physically Abused: No    Sexually Abused: No    Family History  Problem Relation Age of Onset   Stroke Mother    Hypertension Mother    Diabetes Father     Past Surgical History:  Procedure Laterality Date   IR ANGIO INTRA EXTRACRAN SEL INTERNAL CAROTID BILAT MOD SED  12/25/2016   IR ANGIO VERTEBRAL SEL VERTEBRAL UNI L MOD SED  12/25/2016   IR ANGIOGRAM FOLLOW UP STUDY  12/25/2016   IR ANGIOGRAM FOLLOW UP STUDY  12/25/2016   IR ANGIOGRAM FOLLOW UP STUDY  12/25/2016   IR ANGIOGRAM FOLLOW UP STUDY  12/25/2016   IR TRANSCATH/EMBOLIZ  12/25/2016   RADIOLOGY WITH ANESTHESIA N/A 12/25/2016   Procedure: RADIOLOGY WITH ANESTHESIA;  Surgeon: Lanis Pupa, MD;  Location: [facility] OR;  Service: Radiology;  Laterality: N/A;    ROS: Review of Systems Negative except as stated above  PHYSICAL EXAM: BP 107/70 (BP Location: Left Arm, Patient Position: Sitting, Cuff Size: Large)   Pulse 74   Temp (!) 97.5 F (36.4 C) (Oral)   Ht 5' 4 (1.626 m)   Wt 204 lb (92.5 kg)   LMP 02/19/2022   SpO2 100%   BMI 35.02 kg/m   Wt Readings from Last 3 Encounters:  04/21/24 204 lb (92.5 kg)  12/20/23 208 lb  (94.3 kg)  08/22/23 209 lb (94.8 kg)    Physical Exam  General appearance - alert, well appearing, and in no distress Mental status - normal mood, behavior, speech, dress, motor activity, and thought processes Chest - clear to auscultation, no wheezes, rales or rhonchi, symmetric air entry Heart - normal rate, regular rhythm, normal S1, S2, no murmurs, rubs, clicks or gallops Extremities - peripheral pulses normal, no pedal edema, no clubbing or cyanosis Skin - 2-3 cm small mildly erythematous slightly fluctuant area on RT mid abdomin more laterally. No pus expressed. Peeling of skin around the area      Latest Ref Rng & Units 12/20/2023    3:37 PM 12/20/2022    2:10 PM 11/02/2021    3:03 PM  CMP  Glucose 70 - 99 mg/dL 892  897  888   BUN 6 - 24 mg/dL 22  18  18    Creatinine 0.57 - 1.00 mg/dL 9.20  9.23  9.25   Sodium 134 - 144 mmol/L 143  142  146   Potassium 3.5 - 5.2 mmol/L 5.0  4.4  4.7   Chloride 96 - 106 mmol/L 104  105  105   CO2 20 - 29 mmol/L 21  21  23    Calcium  8.7 - 10.2 mg/dL 89.8  9.3  9.9   Total Protein 6.0 - 8.5 g/dL 7.6  7.1  7.8   Total Bilirubin 0.0 - 1.2 mg/dL 0.5  0.6  0.6   Alkaline Phos 44 - 121 IU/L 100  95  99   AST 0 - 40 IU/L 20  17  20    ALT 0 - 32 IU/L 20  19  26     Lipid Panel     Component Value Date/Time   CHOL 130 12/20/2023 1537   TRIG 301 (H) 12/20/2023 1537   HDL 38 (L) 12/20/2023 1537   CHOLHDL 3.4 12/20/2023 1537   LDLCALC 46 12/20/2023 1537  CBC    Component Value Date/Time   WBC 9.6 12/20/2023 1537   WBC 6.3 02/05/2017 0443   RBC 5.21 12/20/2023 1537   RBC 4.16 02/05/2017 0443   HGB 14.5 12/20/2023 1537   HCT 46.1 12/20/2023 1537   PLT 392 12/20/2023 1537   MCV 89 12/20/2023 1537   MCH 27.8 12/20/2023 1537   MCH 27.2 02/05/2017 0443   MCHC 31.5 12/20/2023 1537   MCHC 31.1 02/05/2017 0443   RDW 13.5 12/20/2023 1537   LYMPHSABS 1.8 02/05/2017 0443   MONOABS 0.4 02/05/2017 0443   EOSABS 0.1 02/05/2017 0443   BASOSABS  0.0 02/05/2017 0443    ASSESSMENT AND PLAN: 1. Type 2 diabetes mellitus with obesity (HCC) (Primary) At goal.  Continue metformin  healthy eating habits and regular exercise - POCT glucose (manual entry) - POCT glycosylated hemoglobin (Hb A1C) - metFORMIN  (GLUCOPHAGE ) 500 MG tablet; Take 1 tablet (500 mg total) by mouth daily with breakfast.  Dispense: 90 tablet; Refill: 3 - Microalbumin / creatinine urine ratio  2. Hypertension associated with type 2 diabetes mellitus (HCC) At goal. Continue Lisinopril  - lisinopril  (ZESTRIL ) 10 MG tablet; Take 1 tablet (10 mg total) by mouth daily.  Dispense: 90 tablet; Refill: 3  3. Hyperlipidemia associated with type 2 diabetes mellitus (HCC) - atorvastatin  (LIPITOR) 10 MG tablet; Take 1 tablet (10 mg total) by mouth daily.  Dispense: 90 tablet; Refill: 3  4. Abscess of abdominal wall Resolving abscess of abdominal wall. Will give course of Bactrim . F/u if it does not resolve - sulfamethoxazole -trimethoprim  (BACTRIM  DS) 800-160 MG tablet; Take 1 tablet by mouth 2 (two) times daily.  Dispense: 14 tablet; Refill: 0  5. Need for influenza vaccination - Flu vaccine trivalent PF, 6mos and older(Flulaval,Afluria,Fluarix,Fluzone)  Patient was given the opportunity to ask questions.  Patient verbalized understanding of the plan and was able to repeat key elements of the plan.   This documentation was completed using Paediatric nurse.  Any transcriptional errors are unintentional.  Orders Placed This Encounter  Procedures   POCT glucose (manual entry)   POCT glycosylated hemoglobin (Hb A1C)     Requested Prescriptions   Pending Prescriptions Disp Refills   metFORMIN  (GLUCOPHAGE ) 500 MG tablet 90 tablet 1    Sig: Take 1 tablet (500 mg total) by mouth daily with breakfast.   lisinopril  (ZESTRIL ) 10 MG tablet 90 tablet 1    Sig: Take 1 tablet (10 mg total) by mouth daily.   atorvastatin  (LIPITOR) 10 MG tablet 90 tablet 1    Sig:  Take 1 tablet (10 mg total) by mouth daily.    No follow-ups on file.  Barnie Louder, MD, FACP

## 2024-04-23 ENCOUNTER — Ambulatory Visit: Payer: Self-pay | Admitting: Internal Medicine

## 2024-04-23 LAB — MICROALBUMIN / CREATININE URINE RATIO
Creatinine, Urine: 28.4 mg/dL
Microalb/Creat Ratio: <11 mg/g{creat} (ref 0–29)
Microalbumin, Urine: <3 ug/mL

## 2024-07-25 ENCOUNTER — Ambulatory Visit
Admission: EM | Admit: 2024-07-25 | Discharge: 2024-07-25 | Disposition: A | Discharge status: Home / Self Care | Payer: Self-pay | Attending: Family Medicine | Admitting: Family Medicine

## 2024-07-25 DIAGNOSIS — R509 Fever, unspecified: Secondary | ICD-10-CM

## 2024-07-25 DIAGNOSIS — J988 Other specified respiratory disorders: Secondary | ICD-10-CM

## 2024-07-25 DIAGNOSIS — J309 Allergic rhinitis, unspecified: Secondary | ICD-10-CM

## 2024-07-25 DIAGNOSIS — B9789 Other viral agents as the cause of diseases classified elsewhere: Secondary | ICD-10-CM

## 2024-07-25 LAB — POCT INFLUENZA A/B
Influenza A, POC: NEGATIVE
Influenza B, POC: NEGATIVE

## 2024-07-25 MED ORDER — PREDNISONE 10 MG PO TABS: 30.0000 mg | ORAL_TABLET | Freq: Every day | ORAL | 0 refills | Status: DC

## 2024-07-25 NOTE — ED Provider Notes (Signed)
 " Producer, Television/film/video - URG[facility]  Note:  This document was prepared using Conservation officer, historic buildings and may include unintentional dictation errors.  MRN: [id number] DOB: 06/10/1976  Subjective:   Aimee [name] is a 48 y.o. female presenting for 4-day history of sinus congestion, sinus drainage, sneezing, itching throat.  Has difficult to control allergies.  Has previously been on Allegra but was switched to Zyrtec.  Has well-controlled diabetes treated without insulin .  No cough, chest pain, shortness of breath or wheezing.  Current Outpatient Medications  Medication Instructions   aspirin 81 MG chewable tablet Daily   atorvastatin  (LIPITOR) 10 mg, Oral, Daily   Blood Glucose Monitoring Suppl (TRUE METRIX METER) w/Device KIT Use as directed   clotrimazole -betamethasone  (LOTRISONE ) cream 1 application , Topical, 2 times daily, For 10 days then as needed   diclofenac  (VOLTAREN ) 75 mg, Oral, 2 times daily   glucose blood (TRUE METRIX BLOOD GLUCOSE TEST) test strip Use to check blood sugar once daily. E11.69   ibuprofen (ADVIL) 200 mg, As needed   lisinopril  (ZESTRIL ) 10 mg, Oral, Daily   metFORMIN  (GLUCOPHAGE ) 500 mg, Oral, Daily with breakfast   omeprazole (PRILOSEC) 20 mg, Daily   sulfamethoxazole -trimethoprim  (BACTRIM  DS) 800-160 MG tablet 1 tablet, Oral, 2 times daily   TRUEPLUS LANCETS 28G MISC Use as directed    Allergies[1]  Past Medical History:  Diagnosis Date   Diabetes mellitus without complication (HCC)    Hypertension    Stroke [facility])    Dec 25 2016     Past Surgical History:  Procedure Laterality Date   IR ANGIO INTRA EXTRACRAN SEL INTERNAL CAROTID BILAT MOD SED  12/25/2016   IR ANGIO VERTEBRAL SEL VERTEBRAL UNI L MOD SED  12/25/2016   IR ANGIOGRAM FOLLOW UP STUDY  12/25/2016   IR ANGIOGRAM FOLLOW UP STUDY  12/25/2016   IR ANGIOGRAM FOLLOW UP STUDY  12/25/2016   IR ANGIOGRAM FOLLOW UP STUDY  12/25/2016   IR TRANSCATH/EMBOLIZ  12/25/2016    RADIOLOGY WITH ANESTHESIA N/A 12/25/2016   Procedure: RADIOLOGY WITH ANESTHESIA;  Surgeon: Lanis Pupa, MD;  Location: [facility] OR;  Service: Radiology;  Laterality: N/A;    Family History  Problem Relation Age of Onset   Stroke Mother    Hypertension Mother    Diabetes Father     Social History   Occupational History   Not on file  Tobacco Use   Smoking status: Never   Smokeless tobacco: Never  Vaping Use   Vaping status: Never Used  Substance and Sexual Activity   Alcohol use: No   Drug use: No   Sexual activity: Not Currently     ROS   Objective:   Vitals: BP (!) 150/91 (BP Location: Right Arm)   Pulse 74   Temp 100 F (37.8 C) (Oral)   Resp 18   LMP 02/19/2022   SpO2 96%   Physical Exam Constitutional:      General: She is not in acute distress.    Appearance: Normal appearance. She is well-developed and normal weight. She is not ill-appearing, toxic-appearing or diaphoretic.  HENT:     Head: Normocephalic and atraumatic.     Right Ear: Tympanic membrane, ear canal and external ear normal. No drainage or tenderness. No middle ear effusion. There is no impacted cerumen. Tympanic membrane is not erythematous or bulging.     Left Ear: Tympanic membrane, ear canal and external ear normal. No drainage or tenderness.  No middle ear effusion. There  is no impacted cerumen. Tympanic membrane is not erythematous or bulging.     Nose: Congestion present. No rhinorrhea.     Mouth/Throat:     Mouth: Mucous membranes are moist. No oral lesions.     Pharynx: No pharyngeal swelling, oropharyngeal exudate, posterior oropharyngeal erythema or uvula swelling.     Tonsils: No tonsillar exudate or tonsillar abscesses.     Comments: Thick streaks of postnasal drainage overlying pharynx.  Eyes:     General: No scleral icterus.       Right eye: No discharge.        Left eye: No discharge.     Extraocular Movements: Extraocular movements intact.     Right eye: Normal extraocular  motion.     Left eye: Normal extraocular motion.     Conjunctiva/sclera: Conjunctivae normal.  Cardiovascular:     Rate and Rhythm: Normal rate and regular rhythm.     Heart sounds: Normal heart sounds. No murmur heard.    No friction rub. No gallop.  Pulmonary:     Effort: Pulmonary effort is normal. No respiratory distress.     Breath sounds: No stridor. No wheezing, rhonchi or rales.  Chest:     Chest wall: No tenderness.  Musculoskeletal:     Cervical back: Normal range of motion and neck supple.  Lymphadenopathy:     Cervical: No cervical adenopathy.  Skin:    General: Skin is warm and dry.  Neurological:     General: No focal deficit present.     Mental Status: She is alert and oriented to person, place, and time.  Psychiatric:        Mood and Affect: Mood normal.        Behavior: Behavior normal.     Results for orders placed or performed during the hospital encounter of 07/25/24 (from the past 24 hours)  POCT Influenza A/B     Status: None   Collection Time: 07/25/24  3:05 PM  Result Value Ref Range   Influenza A, POC Negative Negative   Influenza B, POC Negative Negative    Assessment and Plan :   PDMP not reviewed this encounter.  1. Viral respiratory infection   2. Fever, unspecified   3. Allergic rhinitis, unspecified seasonality, unspecified trigger      Suspect viral URI with symptoms worsened due to her underlying allergic rhinitis.  Offered prednisone  to help with this.  Will uphold antibiotic stewardship.  Use supportive care otherwise.  Deferred imaging given clear cardiopulmonary exam, hemodynamically stable vital signs.  Counseled patient on potential for adverse effects with medications prescribed/recommended today, ER and return-to-clinic precautions discussed, patient verbalized understanding.     [1] No Known Allergies    Christopher Savannah, PA-C 07/25/24 1524  "

## 2024-07-25 NOTE — Discharge Instructions (Addendum)
 We will manage this as a viral respiratory infection likely worsened by your allergies. For sore throat or cough try using a honey-based tea. Use 3 teaspoons of honey with juice squeezed from half lemon. Place shaved pieces of ginger into 1/2-1 cup of water and warm over stove top. Then mix the ingredients and repeat every 4 hours as needed. Please take Tylenol  500mg -650mg  once every 6 hours for fevers, aches and pains. Hydrate very well with at least 2 liters (64 ounces) of water. Eat light meals such as soups (chicken and noodles, chicken wild rice, vegetable).  Do not eat any foods that you are allergic to.  Start an antihistamine like Zyrtec (10mg  daily) for postnasal drainage, sinus congestion.  You can take this together with prednisone , a steroid, to help with your underlying allergies.

## 2024-07-25 NOTE — ED Triage Notes (Signed)
 Pt reports nasal congestion, itchy in the palate and sneezing x 4days. Tylenol  sinus gives no relief.

## 2024-08-21 ENCOUNTER — Ambulatory Visit: Payer: Self-pay | Admitting: Internal Medicine

## 2024-08-21 ENCOUNTER — Encounter: Payer: Self-pay | Admitting: Internal Medicine

## 2024-08-21 ENCOUNTER — Other Ambulatory Visit: Payer: Self-pay

## 2024-08-21 VITALS — BP 127/77 | HR 80 | Temp 98.7°F | Ht 64.0 in | Wt 200.0 lb

## 2024-08-21 DIAGNOSIS — J302 Other seasonal allergic rhinitis: Secondary | ICD-10-CM

## 2024-08-21 DIAGNOSIS — I10 Essential (primary) hypertension: Secondary | ICD-10-CM

## 2024-08-21 DIAGNOSIS — J4520 Mild intermittent asthma, uncomplicated: Secondary | ICD-10-CM

## 2024-08-21 DIAGNOSIS — Z7984 Long term (current) use of oral hypoglycemic drugs: Secondary | ICD-10-CM

## 2024-08-21 DIAGNOSIS — E785 Hyperlipidemia, unspecified: Secondary | ICD-10-CM

## 2024-08-21 DIAGNOSIS — E1159 Type 2 diabetes mellitus with other circulatory complications: Secondary | ICD-10-CM

## 2024-08-21 DIAGNOSIS — E1169 Type 2 diabetes mellitus with other specified complication: Secondary | ICD-10-CM

## 2024-08-21 DIAGNOSIS — E119 Type 2 diabetes mellitus without complications: Secondary | ICD-10-CM

## 2024-08-21 LAB — POCT GLYCOSYLATED HEMOGLOBIN (HGB A1C): Hemoglobin A1C: 6.5 % — AB (ref 4.0–5.6)

## 2024-08-21 MED ORDER — ATORVASTATIN CALCIUM 10 MG PO TABS: 10.0000 mg | ORAL_TABLET | Freq: Every day | ORAL | 3 refills | Status: AC

## 2024-08-21 MED ORDER — FLUTICASONE PROPIONATE 50 MCG/ACT NA SUSP
1.0000 | Freq: Every day | NASAL | 1 refills | Status: AC | PRN
  Filled 2024-08-21: qty 16, 60d supply, fill #0

## 2024-08-21 MED ORDER — FEXOFENADINE HCL 60 MG PO TABS
60.0000 mg | ORAL_TABLET | Freq: Two times a day (BID) | ORAL | 3 refills | Status: AC
  Filled 2024-08-21: qty 60, 30d supply, fill #0

## 2024-08-21 MED ORDER — ALBUTEROL SULFATE HFA 108 (90 BASE) MCG/ACT IN AERS
2.0000 | INHALATION_SPRAY | Freq: Four times a day (QID) | RESPIRATORY_TRACT | 0 refills | Status: AC | PRN
  Filled 2024-08-21: qty 6.7, 25d supply, fill #0

## 2024-08-21 NOTE — Progress Notes (Signed)
 "   Patient ID: Aimee [name], female    DOB: Jul 29, 1976  MRN: [id number]  CC: chronic ds management  Subjective: Aimee [name] is a 49 y.o. female who presents for chronic ds management. 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    Discussed the use of AI scribe software for clinical note transcription with the patient, who gave verbal consent to proceed.  History of Present Illness Aimee [name] is a 49 year old female with diabetes, hypertension, and hyperlipidemia who presents for follow-up of her chronic medical conditions.  HTN: She has been monitoring her blood pressure, with readings consistently within the target range, such as 114/74, 112/78, and 115/74. She continues to take lisinopril  10 mg daily. No chest pain, shortness of breath, or leg swelling.  DM: Results for orders placed or performed in visit on 08/21/24  POCT glycosylated hemoglobin (Hb A1C)   Collection Time: 08/21/24  3:20 PM  Result Value Ref Range   Hemoglobin A1C 6.5 (A) 4.0 - 5.6 %   HbA1c POC (<> result, manual entry)     HbA1c, POC (prediabetic range)     HbA1c, POC (controlled diabetic range)      Regarding her diabetes, she has lost 4 pounds since her last visit four months ago, now weighing 200 pounds. Her A1c is 6.5, slightly up from 6.4. Her blood sugar readings, primarily taken in the mornings, range from 97 to 119 base on log that she has with her. She is trying to improve her eating habits.  She visited urgent care on December 20th due to symptoms she thought were sinus-related and was diagnosed with a viral respiratory infection and allergies. Her blood pressure at the time was 150/90. She was prescribed prednisone  and tested negative for the flu. She continues to experience nasal itching, sneezing, and congestion,  particularly in the mornings. 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  She has been taking Zyrtec at night for allergies, but it provides only partial relief. She previously used loratadine without success. She does not currently use an allergy nasal spray.  HL: Her cholesterol was last checked in May of the previous year, with an LDL of 46. She continues to take atorvastatin  10 mg daily.    Patient Active Problem List   Diagnosis Date Noted   Class 2 severe obesity due to excess calories with serious comorbidity and body mass index (BMI) of 35.0 to 35.9 in adult 12/20/2022   Gastroesophageal reflux disease without esophagitis 10/31/2020   Fibroadenoma of right breast 12/16/2018   Insomnia 12/16/2018   Rotator cuff syndrome of right shoulder 06/26/2017   Type 2 diabetes mellitus without complication, without long-term current use of insulin  (HCC) 04/11/2017   Reactive hypertension    Class 1 obesity due to excess calories with serious comorbidity and body mass index (BMI) of 30.0 to 30.9 in adult    Essential hypertension    Type 2 diabetes mellitus with obesity    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/25/2016     Medications Ordered Prior to Encounter[1]  Allergies[2]  Social History   Socioeconomic History   Marital status: Single    Spouse name: Not on file  Number of children: 0   Years of education: Not on file   Highest education level: Bachelor's degree (e.g., BA, AB, BS)  Occupational History   Not on file  Tobacco Use   Smoking status: Never   Smokeless tobacco: Never  Vaping Use   Vaping status: Never Used  Substance and Sexual Activity   Alcohol use: No   Drug use: No   Sexual activity: Not Currently  Other Topics Concern   Not on file  Social History Narrative   ** Merged History Encounter **       Right  Handed Lives in a one story Drinks caffeine    Social Drivers of Health   Tobacco Use: Low Risk (08/21/2024)   Patient History    Smoking Tobacco Use: Never    Smokeless Tobacco Use: Never    Passive Exposure: Not on file  Financial Resource Strain: Low Risk (04/21/2024)   Overall Financial Resource Strain (CARDIA)    Difficulty of Paying Living Expenses: Not very hard  Food Insecurity: No Food Insecurity (04/21/2024)   Epic    Worried About Programme Researcher, Broadcasting/film/video in the Last Year: Never true    Ran Out of Food in the Last Year: Never true  Transportation Needs: No Transportation Needs (04/21/2024)   Epic    Lack of Transportation (Medical): No    Lack of Transportation (Non-Medical): No  Physical Activity: Insufficiently Active (04/21/2024)   Exercise Vital Sign    Days of Exercise per Week: 4 days    Minutes of Exercise per Session: 30 min  Stress: No Stress Concern Present (04/21/2024)   [employer] of Occupational Health - Occupational Stress Questionnaire    Feeling of Stress: Not at all  Social Connections: Moderately Isolated (04/21/2024)   Social Connection and Isolation Panel    Frequency of Communication with Friends and Family: Once a week    Frequency of Social Gatherings with Friends and Family: Once a week    Attends Religious Services: More than 4 times per year    Active Member of Clubs or Organizations: Yes    Attends Banker Meetings: More than 4 times per year    Marital Status: Never married  Intimate Partner Violence: Not At Risk (04/21/2024)   Epic    Fear of Current or Ex-Partner: No    Emotionally Abused: No    Physically Abused: No    Sexually Abused: No  Depression (PHQ2-9): Low Risk (04/21/2024)   Depression (PHQ2-9)    PHQ-2 Score: 0  Alcohol Screen: Low Risk (04/21/2024)   Alcohol Screen    Last Alcohol Screening Score (AUDIT): 0  Housing: Low Risk (04/21/2024)   Epic    Unable to Pay for Housing in the Last Year: No    Number of  Times Moved in the Last Year: 0    Homeless in the Last Year: No  Utilities: Not At Risk (04/21/2024)   Epic    Threatened with loss of utilities: No  Health Literacy: Adequate Health Literacy (04/21/2024)   B1300 Health Literacy    Frequency of need for help with medical instructions: Never    Family History  Problem Relation Age of Onset   Stroke Mother    Hypertension Mother    Diabetes Father     Past Surgical History:  Procedure Laterality Date   IR ANGIO INTRA EXTRACRAN SEL INTERNAL CAROTID BILAT MOD SED  12/25/2016   IR ANGIO VERTEBRAL SEL VERTEBRAL UNI L MOD SED  12/25/2016   IR ANGIOGRAM FOLLOW UP STUDY  12/25/2016   IR ANGIOGRAM FOLLOW UP STUDY  12/25/2016   IR ANGIOGRAM FOLLOW UP STUDY  12/25/2016   IR ANGIOGRAM FOLLOW UP STUDY  12/25/2016   IR TRANSCATH/EMBOLIZ  12/25/2016   RADIOLOGY WITH ANESTHESIA N/A 12/25/2016   Procedure: RADIOLOGY WITH ANESTHESIA;  Surgeon: Lanis Pupa, MD;  Location: [facility] OR;  Service: Radiology;  Laterality: N/A;    ROS: Review of Systems Negative except as stated above  PHYSICAL EXAM: BP 127/77   Pulse 80   Temp 98.7 F (37.1 C) (Oral)   Ht 5' 4 (1.626 m)   Wt 200 lb (90.7 kg)   LMP 02/19/2022   SpO2 98%   BMI 34.33 kg/m   Wt Readings from Last 3 Encounters:  08/21/24 200 lb (90.7 kg)  04/21/24 204 lb (92.5 kg)  12/20/23 208 lb (94.3 kg)   Physical Exam  General appearance - alert, well appearing, and in no distress Mental status - normal mood, behavior, speech, dress, motor activity, and thought processes Nose - mild enlargement od nasal turbinates Mouth - mucous membranes moist, pharynx normal without lesions Neck - supple, no significant adenopathy Chest - clear to auscultation, no wheezes, rales or rhonchi, symmetric air entry Heart - normal rate, regular rhythm, normal S1, S2, no murmurs, rubs, clicks or gallops Extremities - No LE edema      Latest Ref Rng & Units 12/20/2023    3:37 PM 12/20/2022    2:10 PM  11/02/2021    3:03 PM  CMP  Glucose 70 - 99 mg/dL 892  897  888   BUN 6 - 24 mg/dL 22  18  18    Creatinine 0.57 - 1.00 mg/dL 9.20  9.23  9.25   Sodium 134 - 144 mmol/L 143  142  146   Potassium 3.5 - 5.2 mmol/L 5.0  4.4  4.7   Chloride 96 - 106 mmol/L 104  105  105   CO2 20 - 29 mmol/L 21  21  23    Calcium  8.7 - 10.2 mg/dL 89.8  9.3  9.9   Total Protein 6.0 - 8.5 g/dL 7.6  7.1  7.8   Total Bilirubin 0.0 - 1.2 mg/dL 0.5  0.6  0.6   Alkaline Phos 44 - 121 IU/L 100  95  99   AST 0 - 40 IU/L 20  17  20    ALT 0 - 32 IU/L 20  19  26     Lipid Panel     Component Value Date/Time   CHOL 130 12/20/2023 1537   TRIG 301 (H) 12/20/2023 1537   HDL 38 (L) 12/20/2023 1537   CHOLHDL 3.4 12/20/2023 1537   LDLCALC 46 12/20/2023 1537    CBC    Component Value Date/Time   WBC 9.6 12/20/2023 1537   WBC 6.3 02/05/2017 0443   RBC 5.21 12/20/2023 1537   RBC 4.16 02/05/2017 0443   HGB 14.5 12/20/2023 1537   HCT 46.1 12/20/2023 1537   PLT 392 12/20/2023 1537   MCV 89 12/20/2023 1537   MCH 27.8 12/20/2023 1537   MCH 27.2 02/05/2017 0443   MCHC 31.5 12/20/2023 1537   MCHC 31.1 02/05/2017 0443   RDW 13.5 12/20/2023 1537   LYMPHSABS 1.8 02/05/2017 0443   MONOABS 0.4 02/05/2017 0443   EOSABS 0.1 02/05/2017 0443   BASOSABS 0.0 02/05/2017 0443    ASSESSMENT AND PLAN: 1. Type 2 diabetes mellitus with other circulatory complication, without long-term current use  of insulin  (HCC) (Primary) Well-controlled with A1c of 6.5%. Blood glucoses within target range. Weight decreased by 4 pounds. - Continue Metformin  500 mg daily and healthy eating habits - Continue to monitor blood glucose levels regularly. - POCT glycosylated hemoglobin (Hb A1C)  2. Diabetes mellitus treated with oral medication (HCC) See # 1 above  3. Hyperlipidemia associated with type 2 diabetes mellitus (HCC) Last LDL at goal Continue Lipitor - atorvastatin  (LIPITOR) 10 MG tablet; Take 1 tablet (10 mg total) by mouth daily.   Dispense: 90 tablet; Refill: 3  4. Hypertension associated with type 2 diabetes mellitus (HCC) At goal Continue Lisinopril  10 mg daily  5. Mild intermittent reactive airway disease without complication Try to wear mask at work if environment not too hot. Trail Albuterol  inh. Went over with her how to use it.  - albuterol  (VENTOLIN  HFA) 108 (90 Base) MCG/ACT inhaler; Inhale 2 puffs into the lungs every 6 (six) hours as needed for wheezing or shortness of breath.  Dispense: 6.7 g; Refill: 0  6. Seasonal allergies Stop Zyrtec. Start Allegra  and Flonase  nasal spray - fexofenadine  (ALLEGRA  ALLERGY) 60 MG tablet; Take 1 tablet (60 mg total) by mouth 2 (two) times daily.  Dispense: 60 tablet; Refill: 3 - fluticasone  (FLONASE ) 50 MCG/ACT nasal spray; Place 1 spray into both nostrils daily as needed for allergies or rhinitis.  Dispense: 16 g; Refill: 1   Patient was given the opportunity to ask questions.  Patient verbalized understanding of the plan and was able to repeat key elements of the plan.   This documentation was completed using Paediatric nurse.  Any transcriptional errors are unintentional.  Orders Placed This Encounter  Procedures   POCT glycosylated hemoglobin (Hb A1C)     Requested Prescriptions   Signed Prescriptions Disp Refills   atorvastatin  (LIPITOR) 10 MG tablet 90 tablet 3    Sig: Take 1 tablet (10 mg total) by mouth daily.   albuterol  (VENTOLIN  HFA) 108 (90 Base) MCG/ACT inhaler 6.7 g 0    Sig: Inhale 2 puffs into the lungs every 6 (six) hours as needed for wheezing or shortness of breath.   fexofenadine  (ALLEGRA  ALLERGY) 60 MG tablet 60 tablet 3    Sig: Take 1 tablet (60 mg total) by mouth 2 (two) times daily.   fluticasone  (FLONASE ) 50 MCG/ACT nasal spray 16 g 1    Sig: Place 1 spray into both nostrils daily as needed for allergies or rhinitis.    Return in about 4 months (around 12/19/2024).  Barnie Louder, MD, FACP    [1]  Current  Outpatient Medications on File Prior to Visit  Medication Sig Dispense Refill   aspirin  81 MG chewable tablet Chew by mouth daily.     Blood Glucose Monitoring Suppl (TRUE METRIX METER) w/Device KIT Use as directed 1 kit 0   diclofenac  (VOLTAREN ) 75 MG EC tablet Take 1 tablet (75 mg total) by mouth 2 (two) times daily. 50 tablet 2   glucose blood (TRUE METRIX BLOOD GLUCOSE TEST) test strip Use to check blood sugar once daily. E11.69 100 each 0   ibuprofen (ADVIL,MOTRIN) 200 MG tablet Take 200 mg by mouth as needed.     lisinopril  (ZESTRIL ) 10 MG tablet Take 1 tablet (10 mg total) by mouth daily. 90 tablet 3   metFORMIN  (GLUCOPHAGE ) 500 MG tablet Take 1 tablet (500 mg total) by mouth daily with breakfast. 90 tablet 3   omeprazole (PRILOSEC) 20 MG capsule Take 20 mg by mouth daily. (  Patient not taking: No sig reported)     TRUEPLUS LANCETS 28G MISC Use as directed 100 each 4   No current facility-administered medications on file prior to visit.  [2] No Known Allergies  "

## 2024-08-21 NOTE — Patient Instructions (Addendum)
" °  VISIT SUMMARY: Aimee [name], a 49 year old female with diabetes, hypertension, and hyperlipidemia, came in for a follow-up on her chronic conditions. Her blood pressure and cholesterol levels are well-controlled, and she has lost 4 pounds since her last visit. She continues to experience allergy symptoms, particularly at work due to chemical exposure.  YOUR PLAN: -TYPE 2 DIABETES MELLITUS: Type 2 diabetes is a condition where your body does not use insulin  properly, leading to high blood sugar levels. Your diabetes is well-controlled with an A1c of 6.5%. Continue your current diabetes management plan and monitor your blood glucose levels regularly.  -HYPERTENSION: Hypertension is high blood pressure, which can lead to serious health problems if not managed. Your blood pressure is well-controlled with your current medication. Continue taking lisinopril  10 mg daily. Your prescription has been refilled.  -HYPERLIPIDEMIA: Hyperlipidemia is having high levels of fats (lipids) in your blood, which can increase your risk of heart disease. Your cholesterol levels are well-managed with atorvastatin . Continue taking atorvastatin  10 mg daily. Your prescription has been refilled.  -ALLERGIC RHINITIS WITH CHEMICAL IRRITANT EXPOSURE: Allergic rhinitis is inflammation of the inside of your nose caused by an allergen, such as pollen, dust, or chemicals. Your symptoms include nasal congestion, sneezing, and itchy nose due to chemical exposure at work. You have been prescribed Allegra  60 mg twice daily, Flonase  nasal spray as needed, and an albuterol  inhaler for use if you cannot wear a mask and experience coughing. Continue wearing a mask at work to reduce exposure.  INSTRUCTIONS: Please continue with your current medications and follow the new prescriptions for your allergies. Monitor your blood glucose levels regularly and maintain your healthy lifestyle changes. If you experience any new symptoms or  have concerns, schedule a follow-up appointment.    Contains text generated by [employer]bridge.   "

## 2024-12-21 ENCOUNTER — Ambulatory Visit: Payer: Self-pay | Admitting: Internal Medicine
